# Patient Record
Sex: Male | Born: 1945 | Race: White | Hispanic: No | Marital: Married | Smoking: Former smoker
Health system: Southern US, Community
[De-identification: ages and names within clinical notes are randomized; demographics above are authoritative.]

## PROBLEM LIST (undated history)

## (undated) DIAGNOSIS — K219 Gastro-esophageal reflux disease without esophagitis: Secondary | ICD-10-CM

## (undated) DIAGNOSIS — I499 Cardiac arrhythmia, unspecified: Secondary | ICD-10-CM

## (undated) DIAGNOSIS — F419 Anxiety disorder, unspecified: Secondary | ICD-10-CM

## (undated) DIAGNOSIS — M5136 Other intervertebral disc degeneration, lumbar region: Secondary | ICD-10-CM

## (undated) DIAGNOSIS — Z8719 Personal history of other diseases of the digestive system: Secondary | ICD-10-CM

## (undated) DIAGNOSIS — N183 Chronic kidney disease, stage 3 unspecified: Secondary | ICD-10-CM

## (undated) DIAGNOSIS — F32A Depression, unspecified: Secondary | ICD-10-CM

## (undated) DIAGNOSIS — M109 Gout, unspecified: Secondary | ICD-10-CM

## (undated) DIAGNOSIS — M069 Rheumatoid arthritis, unspecified: Secondary | ICD-10-CM

## (undated) DIAGNOSIS — M51369 Other intervertebral disc degeneration, lumbar region without mention of lumbar back pain or lower extremity pain: Secondary | ICD-10-CM

## (undated) DIAGNOSIS — I10 Essential (primary) hypertension: Secondary | ICD-10-CM

## (undated) DIAGNOSIS — F329 Major depressive disorder, single episode, unspecified: Secondary | ICD-10-CM

## (undated) DIAGNOSIS — E785 Hyperlipidemia, unspecified: Secondary | ICD-10-CM

## (undated) HISTORY — PX: CHOLECYSTECTOMY: SHX55

## (undated) HISTORY — DX: Other intervertebral disc degeneration, lumbar region without mention of lumbar back pain or lower extremity pain: M51.369

## (undated) HISTORY — DX: Gastro-esophageal reflux disease without esophagitis: K21.9

## (undated) HISTORY — DX: Other intervertebral disc degeneration, lumbar region: M51.36

## (undated) HISTORY — DX: Hyperlipidemia, unspecified: E78.5

## (undated) HISTORY — DX: Depression, unspecified: F32.A

## (undated) HISTORY — DX: Chronic kidney disease, stage 3 unspecified: N18.30

## (undated) HISTORY — DX: Anxiety disorder, unspecified: F41.9

## (undated) HISTORY — DX: Gout, unspecified: M10.9

## (undated) HISTORY — DX: Personal history of other diseases of the digestive system: Z87.19

## (undated) HISTORY — DX: Rheumatoid arthritis, unspecified: M06.9

## (undated) HISTORY — DX: Essential (primary) hypertension: I10

## (undated) HISTORY — PX: HERNIA REPAIR: SHX51

## (undated) HISTORY — PX: EYE SURGERY: SHX253

---

## 1966-09-05 HISTORY — PX: APPENDECTOMY: SHX54

## 1968-09-05 HISTORY — PX: APPENDECTOMY: SHX54

## 1996-09-05 HISTORY — PX: TIBIA FRACTURE SURGERY: SHX806

## 1997-12-23 ENCOUNTER — Ambulatory Visit (HOSPITAL_COMMUNITY): Admission: RE | Admit: 1997-12-23 | Discharge: 1997-12-23 | Payer: Self-pay | Admitting: Neurosurgery

## 1998-09-05 DIAGNOSIS — Z8711 Personal history of peptic ulcer disease: Secondary | ICD-10-CM

## 1998-09-05 HISTORY — PX: OTHER SURGICAL HISTORY: SHX169

## 1998-09-05 HISTORY — DX: Personal history of peptic ulcer disease: Z87.11

## 1998-09-05 HISTORY — PX: LUMBAR LAMINECTOMY: SHX95

## 2001-09-28 ENCOUNTER — Encounter: Payer: Self-pay | Admitting: Emergency Medicine

## 2001-09-28 ENCOUNTER — Encounter (INDEPENDENT_AMBULATORY_CARE_PROVIDER_SITE_OTHER): Payer: Self-pay | Admitting: *Deleted

## 2001-09-28 ENCOUNTER — Emergency Department (HOSPITAL_COMMUNITY): Admission: EM | Admit: 2001-09-28 | Discharge: 2001-09-29 | Payer: Self-pay | Admitting: Emergency Medicine

## 2003-04-30 ENCOUNTER — Encounter: Payer: Self-pay | Admitting: Orthopaedic Surgery

## 2003-04-30 ENCOUNTER — Encounter: Payer: Self-pay | Admitting: Emergency Medicine

## 2003-04-30 ENCOUNTER — Inpatient Hospital Stay (HOSPITAL_COMMUNITY): Admission: AC | Admit: 2003-04-30 | Discharge: 2003-05-02 | Payer: Self-pay

## 2003-05-01 ENCOUNTER — Encounter: Payer: Self-pay | Admitting: Orthopaedic Surgery

## 2003-07-30 ENCOUNTER — Inpatient Hospital Stay (HOSPITAL_COMMUNITY): Admission: EM | Admit: 2003-07-30 | Discharge: 2003-08-01 | Payer: Self-pay | Admitting: Emergency Medicine

## 2003-08-19 ENCOUNTER — Encounter (INDEPENDENT_AMBULATORY_CARE_PROVIDER_SITE_OTHER): Payer: Self-pay | Admitting: *Deleted

## 2003-09-16 ENCOUNTER — Encounter: Payer: Self-pay | Admitting: Internal Medicine

## 2003-10-23 ENCOUNTER — Encounter: Admission: RE | Admit: 2003-10-23 | Discharge: 2003-11-25 | Payer: Self-pay | Admitting: Orthopaedic Surgery

## 2005-06-13 ENCOUNTER — Ambulatory Visit: Payer: Self-pay | Admitting: Internal Medicine

## 2005-06-23 ENCOUNTER — Ambulatory Visit: Payer: Self-pay | Admitting: Internal Medicine

## 2005-06-23 ENCOUNTER — Encounter (INDEPENDENT_AMBULATORY_CARE_PROVIDER_SITE_OTHER): Payer: Self-pay | Admitting: *Deleted

## 2005-06-23 ENCOUNTER — Encounter (INDEPENDENT_AMBULATORY_CARE_PROVIDER_SITE_OTHER): Payer: Self-pay | Admitting: Specialist

## 2005-09-14 ENCOUNTER — Observation Stay (HOSPITAL_COMMUNITY): Admission: EM | Admit: 2005-09-14 | Discharge: 2005-09-15 | Payer: Self-pay | Admitting: Emergency Medicine

## 2005-09-14 ENCOUNTER — Encounter (INDEPENDENT_AMBULATORY_CARE_PROVIDER_SITE_OTHER): Payer: Self-pay | Admitting: *Deleted

## 2005-09-15 ENCOUNTER — Encounter (INDEPENDENT_AMBULATORY_CARE_PROVIDER_SITE_OTHER): Payer: Self-pay | Admitting: *Deleted

## 2006-10-06 ENCOUNTER — Encounter: Admission: RE | Admit: 2006-10-06 | Discharge: 2006-10-06 | Payer: Self-pay

## 2006-10-08 ENCOUNTER — Encounter: Admission: RE | Admit: 2006-10-08 | Discharge: 2006-10-08 | Payer: Self-pay

## 2007-03-01 ENCOUNTER — Encounter: Admission: RE | Admit: 2007-03-01 | Discharge: 2007-03-01 | Payer: Self-pay | Admitting: Internal Medicine

## 2008-06-09 ENCOUNTER — Encounter: Admission: RE | Admit: 2008-06-09 | Discharge: 2008-06-09 | Payer: Self-pay | Admitting: Internal Medicine

## 2008-11-24 ENCOUNTER — Telehealth: Payer: Self-pay | Admitting: Internal Medicine

## 2008-11-25 ENCOUNTER — Ambulatory Visit: Payer: Self-pay | Admitting: Gastroenterology

## 2008-11-25 DIAGNOSIS — R110 Nausea: Secondary | ICD-10-CM | POA: Insufficient documentation

## 2008-11-25 DIAGNOSIS — D126 Benign neoplasm of colon, unspecified: Secondary | ICD-10-CM

## 2008-11-25 DIAGNOSIS — M069 Rheumatoid arthritis, unspecified: Secondary | ICD-10-CM | POA: Insufficient documentation

## 2008-11-25 DIAGNOSIS — K5730 Diverticulosis of large intestine without perforation or abscess without bleeding: Secondary | ICD-10-CM | POA: Insufficient documentation

## 2008-11-25 DIAGNOSIS — K259 Gastric ulcer, unspecified as acute or chronic, without hemorrhage or perforation: Secondary | ICD-10-CM | POA: Insufficient documentation

## 2008-11-25 DIAGNOSIS — R1084 Generalized abdominal pain: Secondary | ICD-10-CM

## 2008-11-25 DIAGNOSIS — R1013 Epigastric pain: Secondary | ICD-10-CM | POA: Insufficient documentation

## 2008-11-25 DIAGNOSIS — K269 Duodenal ulcer, unspecified as acute or chronic, without hemorrhage or perforation: Secondary | ICD-10-CM | POA: Insufficient documentation

## 2008-11-25 LAB — CONVERTED CEMR LAB
Basophils Relative: 0.3 % (ref 0.0–3.0)
Hemoglobin: 13.8 g/dL (ref 13.0–17.0)
MCHC: 35 g/dL (ref 30.0–36.0)
MCV: 85.3 fL (ref 78.0–100.0)
RDW: 12.4 % (ref 11.5–14.6)
WBC: 9.4 10*3/uL (ref 4.5–10.5)

## 2008-12-10 ENCOUNTER — Ambulatory Visit: Payer: Self-pay | Admitting: Internal Medicine

## 2010-05-07 ENCOUNTER — Encounter: Payer: Self-pay | Admitting: Internal Medicine

## 2010-09-26 ENCOUNTER — Encounter: Payer: Self-pay | Admitting: Internal Medicine

## 2010-10-05 NOTE — Letter (Signed)
Summary: Colonoscopy Letter  [facility]  [address] [place], [place] [address]   Phone: [phone]  Fax: [phone]      May 07, 2010 MRN: [id number]   [name] [address] [place], [place]  [address]   Dear Mr. Weible,   According to your medical record, it is time for you to schedule a Colonoscopy. The American Cancer Society recommends this procedure as a method to detect early colon cancer. Patients with a family history of colon cancer, or a personal history of colon polyps or inflammatory bowel disease are at increased risk.  This letter has been generated based on the recommendations made at the time of your procedure. If you feel that in your particular situation this may no longer apply, please contact our office.  Please call our office at [phone] to schedule this appointment or to update your records at your earliest convenience.  Thank you for cooperating with [place] to provide you with the very best care possible.   Sincerely,  Wilhemina Bonito. Marina Goodell, M.D.  [facility] [phone]

## 2010-10-07 ENCOUNTER — Encounter: Payer: Self-pay | Admitting: Internal Medicine

## 2010-10-14 ENCOUNTER — Telehealth: Payer: Self-pay | Admitting: Internal Medicine

## 2010-10-15 ENCOUNTER — Ambulatory Visit (INDEPENDENT_AMBULATORY_CARE_PROVIDER_SITE_OTHER): Payer: Medicare Other | Admitting: Internal Medicine

## 2010-10-15 ENCOUNTER — Encounter: Payer: Self-pay | Admitting: Internal Medicine

## 2010-10-15 DIAGNOSIS — R112 Nausea with vomiting, unspecified: Secondary | ICD-10-CM

## 2010-10-15 DIAGNOSIS — K219 Gastro-esophageal reflux disease without esophagitis: Secondary | ICD-10-CM | POA: Insufficient documentation

## 2010-10-15 DIAGNOSIS — R1013 Epigastric pain: Secondary | ICD-10-CM

## 2010-10-15 HISTORY — DX: Nausea with vomiting, unspecified: R11.2

## 2010-10-21 ENCOUNTER — Other Ambulatory Visit: Payer: Self-pay | Admitting: Internal Medicine

## 2010-10-21 ENCOUNTER — Encounter (AMBULATORY_SURGERY_CENTER): Payer: Medicare Other | Admitting: Internal Medicine

## 2010-10-21 DIAGNOSIS — D126 Benign neoplasm of colon, unspecified: Secondary | ICD-10-CM

## 2010-10-21 DIAGNOSIS — K219 Gastro-esophageal reflux disease without esophagitis: Secondary | ICD-10-CM

## 2010-10-21 DIAGNOSIS — Z711 Person with feared health complaint in whom no diagnosis is made: Secondary | ICD-10-CM

## 2010-10-21 DIAGNOSIS — Z86010 Personal history of colonic polyps: Secondary | ICD-10-CM

## 2010-10-21 DIAGNOSIS — Z1211 Encounter for screening for malignant neoplasm of colon: Secondary | ICD-10-CM

## 2010-10-21 NOTE — Progress Notes (Signed)
Summary: Triage / abdominal pain with nausea and vomiting   Phone Note Call from Patient Call back at [phone]   Caller: Patient Call For: Dr. Marina Goodell Reason for Call: Talk to Nurse Summary of Call: Had x-ray at Dr. Lynne Logan office on Tues. Continues to have severe abd pain and nausea/vomiting Initial call taken by: Karna Christmas,  October 14, 2010 8:16 AM  Follow-up for Phone Call        Patient c/o abdominal pain, states it has been going on for 2 months. States he had nausea and vomiting last night. He has been to his primary care physician, Dr. Ricki Miller, and had some xrays done. Pt states nothing was found on the xrays.Per Patient Dr Ricki Miller started him on Aciphex this week. His abdominal pain is at his belly button area. Patient received letter to schedule Colon in September and has not scheduled procedure yet. Dr. Marina Goodell please advise. Follow-up by: Selinda Michaels RN,  October 14, 2010 10:47 AM  Additional Follow-up for Phone Call Additional follow up Details #1::        he needs to be seenin the office. He needs to stay on AcipHex. He can see Amy in the office this morning or me in the office tomorrow afternoon Additional Follow-up by: Hilarie Fredrickson MD,  October 14, 2010 10:55 AM    Additional Follow-up for Phone Call Additional follow up Details #2::    Left message to call back, have booked patient to see Dr. Marina Goodell 10/15/10@1 :30pm. Follow-up by: Selinda Michaels RN,  October 14, 2010 11:10 AM  Additional Follow-up for Phone Call Additional follow up Details #3:: Details for Additional Follow-up Action Taken: Notified patient of Dr Lamar Sprinkles note- patient will see Dr Marina Goodell tomorrow, 10/15/10 @ 1:30pm. Graciella Freer, RN Additional Follow-up by: Selinda Michaels RN,  October 14, 2010 11:59 AM

## 2010-10-21 NOTE — Discharge Summary (Signed)
Summary: Discharge Summary  NAME:  [name], [name]               ACCOUNT NO.:  [id number]   MEDICAL RECORD NO.:  [id number]          PATIENT TYPE:  INP   LOCATION:  A304                          FACILITY:  [facility]   PHYSICIAN:  Kingsley Callander. Ouida Sills, MD       DATE OF BIRTH:  06/05/1946   DATE OF ADMISSION:  09/14/2005  DATE OF DISCHARGE:  01/11/2007LH                                 DISCHARGE SUMMARY   DISCHARGE DIAGNOSES:  1.  Gastroenteritis.  2.  Rheumatoid arthritis.  3.  Cholelithiasis.   PROCEDURE:  Abdominal ultrasound.   DISCHARGE MEDICATIONS:  1.  Protonix 40 mg daily.  2.  Leflunomide 20 mg daily.  3.  Humira 40 mg weekly.  4.  Fish oil daily.  5.  Vitamin D, E and C daily.  6.  Lopid 600 mg daily.  7.  Klonopin 1 mg q.h.s.  8.  Tramadol 1 a day.  9.  Paroxetine 20 mg a day.  10. Prednisone 5 mg a day.  11. Folic acid 1 mg a day.  12. Hydroxychloroquine 200 mg b.i.d.  13. Phenergan 25 mg q.6h. p.r.n.   HOSPITAL COURSE:  This patient is a 65 year old white male with rheumatoid  arthritis who presented with a 3-day history of nausea, vomiting and  headache. He presented to the emergency room for evaluation. He had  experienced abdominal pain. His white count was 7.6. He had a low grade  fever of 100.3. LFTs were normal. His lipase was slightly elevated initially  at 56; repeat was 54. He was hospitalized and treated with IV antiemetics  and IV fluids. His repeat CBC was normal. Repeat electrolytes were normal.  His repeat lipase was 54. Amylase was normal at 86. He had an ultrasound of  the abdomen which revealed cholelithiasis. He did not have tenderness,  though, in the right upper quadrant on January 11. His symptoms of abdominal  pain and vomiting promptly  resolved. He was able to take a full liquid diet. He was improved and stable  for discharge on January 11. He will follow-up in the office in two weeks if  needed. He will follow-up with rheumatoid arthritis with Dr.  Phylliss Bob as  scheduled.      Kingsley Callander. Ouida Sills, MD  Electronically Signed     ROF/MEDQ  D:  09/15/2005  T:  09/15/2005  Job:  [id number]

## 2010-10-21 NOTE — Discharge Summary (Signed)
Summary: Discharge Summary   NAME:  EBENEZER, MCCASKEY                         ACCOUNT NO.:  [id number]   MEDICAL RECORD NO.:  [id number]                   PATIENT TYPE:  INP   LOCATION:  3040                                 FACILITY:  [facility]   PHYSICIAN:  Wilhemina Bonito. Marina Goodell, M.D. [facility]             DATE OF BIRTH:  1946/05/13   DATE OF ADMISSION:  07/30/2003  DATE OF DISCHARGE:  08/01/2003                                 DISCHARGE SUMMARY   ADMITTING DIAGNOSES:  1. Upper gastrointestinal bleed, probably from nonsteroidal-induced ulcer,     possibly peptic ulcer disease as well.  2. History of heavy alcohol use, possibly may have some underlying liver     disease.  3. 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  4. Rheumatoid arthritis.  5. Chronic pain.  6. Right leg fracture August 2004.  Status post left tibia-fibula fracture     repaired August 2004 by Dr. Noel Gerold.  7. Status post right inguinal hernia repair.  8. Status post appendectomy.  9. Status post lumbar disk surgery.   DISCHARGE DIAGNOSES:  1. Upper gastrointestinal bleed secondary to aspirin/nonsteroidal anti-     inflammatory drug-induced ulcer.  2. Anemia secondary to acute gastrointestinal bleed with probable underlying     chronic anemia.  Status post transfusions with two units of packed red     blood cells.  3. Azotemia secondary to gastrointestinal bleed, resolving during the course     of hospitalization.  4. CLOtest negative.   CONSULTATIONS:  None.   PROCEDURES:  Upper endoscopy by Dr. Yancey Flemings on July 30, 2003.  This  showed an 8-mm ulcer in the antrum with dark spot but nonbleeding status.  A  deep 15-mm ulcer seen in the duodenal bulb, again a dark spot seen at the  ulcer, but it was not actively bleeding.  No interventions were performed as  there was no active bleeding.   BRIEF HISTORY:  Mr. Mccutchan is a 65 year old gentleman with  a history of  rheumatoid arthritis and chronic pain for which he uses 650 mg of aspirin  and 100 mg of Voltaren every day.  On the morning of admission he started  passing melenic stools and became dizzy and presyncopal.  He had some nausea  but no vomiting.  Blood pressure low at 103/65 on arrival to the emergency  room, pulse of 71, and blood pressure actually dropped to 83/63 but  responded nicely to IV fluid bolus.  Initial hemoglobin was 10, and BUN was  elevated at 47.  The patient gave a history of two to three days of nausea  and vomiting, none of which was bloody, about two weeks prior to admission.  The patient had been using Prilosec OTC once daily.  He had no prior history  of GI bleed, ulcers, and had never undergone colonoscopy  or upper endoscopy  previously.  The patient was evaluated in the emergency room by Dr. Marina Goodell  and was admitted for evaluation and supportive care.   LABORATORIES:  Hemoglobin initially 10.  It dropped to a low of 8.3 and was  10.3 at discharge.  Hematocrit was 30.8 at discharge.  MCV 84.8.  Platelets  219,000.  Stool for occult blood was positive.  PT 14.5, INR 1.2, and PTT  27.  Sodium 140, potassium 4.5, glucose 100, BUN went from 47 down to 24.  Creatinine was 1.3.  Calcium 8.2.  CLOtesting was negative on a gastric  biopsy.   HOSPITAL COURSE:  The patient was initially admitted to an ICU bed.  He had  serial hemoglobins and hematocrits obtained.  He was aggressively hydrated  with normal saline at 150 an hour and started on IV Protonix twice daily.   On the day of admission he underwent upper endoscopy.  Findings are  described above.  His hemoglobin dropped, and two units of packed red blood  cells were ordered transfused.  Following those transfusions the patient's  hemoglobin and hematocrit remained stable in the low 10- to mid 10-mg range.  The patient's symptoms resolved.  He was having no further nausea, and  dizziness resolved.  While  hospitalized he did not have any further stools.  Ultimately his diet was restarted at clears and advanced up to full liquids,  all of which he tolerated.  IV fluids were discontinued, and he was switched  over to p.o. Protonix.  He was discharged to home in stable condition on  hospital day #3.  Follow-up appointment was arranged with Dr. Marina Goodell on  Monday, August 18, 2003.  He was to call Dr. Lamar Sprinkles office for any  problems.   MEDICATIONS AT DISCHARGE:  1. Protonix 40 mg twice daily.  2. Paxil CR 20 mg daily.  3. Clonazepam 1 mg p.o. at h.s.  4. Gemfibrozil 600 mg b.i.d.  5. Plaquenil (hydroxychloroquine) 200 mg b.i.d.   He was advised not to take any more Voltaren or aspirin and to call Dr. Phylliss Bob  for appropriate management of arthritic pain and symptoms.      Jennye Moccasin, P.A. [facility]                   John N. Marina Goodell, M.D. [facility]    SG/MEDQ  D:  08/19/2003  T:  08/19/2003  Job:  [id number]   cc:   Kingsley Callander. Ouida Sills, M.D.  [address]  [place]  [place] [address]  Fax: [phone]   Areatha Keas, M.D.  [address]  [address]  [place]  [place] [address]  Fax: [phone]

## 2010-10-21 NOTE — Letter (Signed)
Summary: [facility] Instructions  [facility]  [address] [place], [place] [address]   Phone: [phone]  Fax: [phone]       EWART CARRERA    1945-12-17    MRN: [id number]        Procedure Day /Date:THURSDAY, 10/21/10     Arrival Time:7:30 AM     Procedure Time:8:30 AM     Location of Procedure:                    X  [facility] (4th Floor)        PREPARATION FOR COLONOSCOPY WITH MOVIPREP/ENDO   Starting 5 days prior to your procedure 10/16/10 do not eat nuts, seeds, popcorn, corn, beans, peas,  salads, or any raw vegetables.  Do not take any fiber supplements (e.g. Metamucil, Citrucel, and Benefiber).  THE DAY BEFORE YOUR PROCEDURE         DATE: 10/20/10  DAY: WEDNESDAY  1.  Drink clear liquids the entire day-NO SOLID FOOD  2.  Do not drink anything colored red or purple.  Avoid juices with pulp.  No orange juice.  3.  Drink at least 64 oz. (8 glasses) of fluid/clear liquids during the day to prevent dehydration and help the prep work efficiently.  CLEAR LIQUIDS INCLUDE: Water Jello Ice Popsicles Tea (sugar ok, no milk/cream) Powdered fruit flavored drinks Coffee (sugar ok, no milk/cream) Gatorade Juice: apple, white grape, white cranberry  Lemonade Clear bullion, consomm, broth Carbonated beverages (any kind) Strained chicken noodle soup Hard Candy                             4.  In the morning, mix first dose of MoviPrep solution:    Empty 1 Pouch A and 1 Pouch B into the disposable container    Add lukewarm drinking water to the top line of the container. Mix to dissolve    Refrigerate (mixed solution should be used within 24 hrs)  5.  Begin drinking the prep at 5:00 p.m. The MoviPrep container is divided by 4 marks.   Every 15 minutes drink the solution down to the next mark (approximately 8 oz) until the full liter is complete.   6.  Follow completed prep with 16 oz of clear liquid of your choice (Nothing red or  purple).  Continue to drink clear liquids until bedtime.  7.  Before going to bed, mix second dose of MoviPrep solution:    Empty 1 Pouch A and 1 Pouch B into the disposable container    Add lukewarm drinking water to the top line of the container. Mix to dissolve    Refrigerate  THE DAY OF YOUR PROCEDURE      DATE: 10/21/10 DAY: THURSDAY  Beginning at 3:30 a.m. (5 hours before procedure):         1. Every 15 minutes, drink the solution down to the next mark (approx 8 oz) until the full liter is complete.  2. Follow completed prep with 16 oz. of clear liquid of your choice.    3. You may drink clear liquids until 6:30 AM (2 HOURS BEFORE PROCEDURE).   MEDICATION INSTRUCTIONS  Unless otherwise instructed, you should take regular prescription medications with a small sip of water   as early as possible the morning of your procedure.         OTHER INSTRUCTIONS  You will need a responsible adult at  least 66 years of age to accompany you and drive you home.   This person must remain in the waiting room during your procedure.  Wear loose fitting clothing that is easily removed.  Leave jewelry and other valuables at home.  However, you may wish to bring a book to read or  an iPod/MP3 player to listen to music as you wait for your procedure to start.  Remove all body piercing jewelry and leave at home.  Total time from sign-in until discharge is approximately 2-3 hours.  You should go home directly after your procedure and rest.  You can resume normal activities the  day after your procedure.  The day of your procedure you should not:   Drive   Make legal decisions   Operate machinery   Drink alcohol   Return to work  You will receive specific instructions about eating, activities and medications before you leave.    The above instructions have been reviewed and explained to me by   _______________________    I fully understand and can verbalize these  instructions _____________________________ Date _________

## 2010-10-21 NOTE — Assessment & Plan Note (Signed)
Summary: Abdominal pain    History of Present Illness Visit Type: Follow-up Visit Primary GI MD: Yancey Flemings MD Primary Provider: Juline Patch, MD Requesting Provider: self Chief Complaint: Abdominal pain, nausea x 2 months; pain occurs every evening History of Present Illness:   65 year old white male a history of rheumatoid arthritis, GERD, NSAID-induced gastric and duodenal ulcer disease, and anxiety/depression. He presents today regarding problems with chronic abdominal pain and recent nausea with vomiting. The patient was last evaluated in March of 2010 with complaints of abdominal pain, vomiting, and diarrhea. See that dictation for details. Upper endoscopy performed 2 weeks later revealed mild esophagitis, but was otherwise normal. The patient was feeling better and told to continue on PPI therapy. He tells me that he has been taking over-the-counter acid reducers. Not sure what type. Current history is that of 2 weeks of mid abdominal discomfort that occurs on a daily basis. Mostly in the evenings after his evening meal. Rarely wakes him. 4 pound weight loss. Nausea with vomiting the past few days. Given AcipHex samples by his PCP 3 days ago. He apparently had blood work drawn that was unremarkable. No complaints of abnormal bowel habits, urinary issues, or fevers. The pain is nonradiating. Her last colonoscopy was in 2006 and revealed several hyperplastic polyps, some large. Followup in 5 years recommended. He is aware and requests followup colonoscopy. Abdominal ultrasound from January 2007 revealed cholelithiasis.   GI Review of Systems    Reports abdominal pain, acid reflux, nausea, vomiting, and  weight loss.     Location of  Abdominal pain: mid and lower abdomen. Weight loss of 4 pounds over 2 days.   Denies belching, bloating, chest pain, dysphagia with liquids, dysphagia with solids, heartburn, loss of appetite, vomiting blood, and  weight gain.        Denies anal fissure, black  tarry stools, change in bowel habit, constipation, diarrhea, diverticulosis, fecal incontinence, heme positive stool, hemorrhoids, irritable bowel syndrome, jaundice, light color stool, liver problems, rectal bleeding, and  rectal pain.    Current Medications (verified): 1)  Allopurinol 300 Mg Tabs (Allopurinol) .... Take 1 Tablet By Mouth Once Daily 2)  Lexapro 10 Mg Tabs (Escitalopram Oxalate) .... Take 1 Daily Am 3)  Clonazepam 1 Mg Tabs (Clonazepam) .... Take 1 Tab At Bedtime 4)  Omega-3 Fish Oil 1000 Mg Caps (Omega-3 Fatty Acids) .... 6000 Mg Ndaily 5)  Prednisone 5 Mg Tabs (Prednisone) .[name].. 1daily or More As Needed 6)  Ultracet 37.5-325 Mg Tabs (Tramadol-Acetaminophen) .... Take 2-3 Daily 7)  Methotrexate Sodium 25 Mg/ml Soln (Methotrexate Sodium) .... 0.6 Ml's Weekly 8)  Aciphex 20 Mg Tbec (Rabeprazole Sodium) .... Take 1 Tablet By Mouth Once Daily 9)  Losartan Potassium 50 Mg Tabs (Losartan Potassium) .... Take 1 Tablet By Mouth Once Daily 10)  Vitamin B-12 1000 Mcg Tabs (Cyanocobalamin) .... Take 1 Tablet By Mouth Once Daily 11)  Folic Acid 1 Mg Tabs (Folic Acid) .... Take 1 Tablet By Mouth Once Daily  Allergies (verified): No Known Drug Allergies  Past History:  Past Medical History: Reviewed history from 11/25/2008 and no changes required. Current Problems:  COLONIC POLYPS (ICD-211.3) DIVERTICULOSIS, COLON (ICD-562.10) GASTRIC ULCER (ICD-531.90) Hx of DUODENAL ULCER (ICD-532.90) ARTHRITIS, RHEUMATOID (ICD-714.0)  Past Surgical History: Reviewed history from 11/25/2008 and no changes required. Lt tibia/fibula fracture repair Appendectomy Lumbar disk surgery Hernia surgery Stomach/ulcer surgery  Family History: N/A No FH of Colon Cancer:  Social History: Reviewed history from 11/25/2008 and no changes required. Occupation:  [employer] Alcohol Use - no Daily Caffeine Use- Coffee Illicit Drug Use - no Patient does not get regular exercise.   Review of  Systems       The patient complains of arthritis/joint pain, back pain, muscle pains/cramps, sleeping problems, and swelling of feet/legs.  The patient denies allergy/sinus, anemia, anxiety-new, blood in urine, breast changes/lumps, change in vision, confusion, cough, coughing up blood, depression-new, fainting, fatigue, fever, headaches-new, hearing problems, heart murmur, heart rhythm changes, itching, menstrual pain, night sweats, nosebleeds, pregnancy symptoms, shortness of breath, skin rash, sore throat, swollen lymph glands, thirst - excessive , urination - excessive , urination changes/pain, urine leakage, vision changes, and voice change.    Vital Signs:  Patient profile:   65 year old male Height:      68 inches Weight:      168.13 pounds BMI:     25.66 Pulse rate:   76 / minute Pulse rhythm:   regular BP sitting:   120 / 66  (left arm) Cuff size:   regular  Vitals Entered By: June McMurray CMA Duncan Dull) (October 15, 2010 1:22 PM)  Physical Exam  General:  Well developed, well nourished, no acute distress. Head:  Normocephalic and atraumatic. Eyes:  PERRLA, no icterus. Ears:  Normal auditory acuity. Nose:  No deformity, discharge,  or lesions. Mouth:  No deformity or lesions. Neck:  Supple; no masses or thyromegaly. Lungs:  Clear throughout to auscultation. Heart:  Regular rate and rhythm; no murmurs, rubs,  or bruits. Abdomen:  Soft, nontender and nondistended. No masses, hepatosplenomegaly or hernias noted. Normal bowel sounds. Msk:   Normal posture. Pulses:  Normal pulses noted. Extremities:  No clubbing, cyanosis, edema. Some deformity of the hands Neurologic:  Alert and  oriented x4;  grossly normal neurologically. Skin:  Intact without significant lesions or rashes. Psych:  Alert and cooperative. Normal mood and affect.   Impression & Recommendations:  Problem # 1:  ABDOMINAL PAIN-EPIGASTRIC (ICD-789.06) 2 month history of daily abdominal pain as described.  Problems with nausea and vomiting over the past 2 days. Has known GERD, a prior history of peptic ulcer disease. Also cholelithiasis on ultrasound.  Plan: #1. Dexilant 60 mg daily. Samples given #2. Schedule upper endoscopy #3. Obtain outside laboratories for review #4. If endoscopy unremarkable, repeat abdominal ultrasound to confirm cholelithiasis. If present, surgical referral for possible laparoscopic cholecystectomy  Problem # 2:  NAUSEA WITH VOMITING (ICD-787.01) see above discussion and plan  Problem # 3:  COLONIC POLYPS (ICD-211.3) history of large hyperplastic polyps. Due for followup.  Plan: #1 colonoscopy. The nature of the procedure as well as the risks, benefits, and alternatives were reviewed. He understood and agreed to proceed. Movi prep prescribed. The patient instructed on its use  Problem # 4:  GERD (ICD-530.81) PPI prescribed antireflux precautions  Other Orders: Colon/Endo (Colon/Endo)  Patient Instructions: 1)  Colon/Endo [facility] 10/21/10 8:30 am arrive at 7:30 am on the 4th floor. 2)  Movi prep instructions given to patient and Movi prescription has been sent to your pharmacy. 3)  Dexilant samples given for you to take 1 by mouth once daily 30 minutes prior to breakfast. 4)  We will obtain your lab results from Dr. Lynne Logan office. 5)  Colonoscopy and Flexible Sigmoidoscopy brochure given.  6)  Upper Endoscopy brochure given.  7)  Copy sent to : Juline Patch, MD 8)  The medication list was reviewed and reconciled.  All changed / newly prescribed medications were explained.  A complete medication  list was provided to the patient / caregiver. Prescriptions: MOVIPREP 100 GM  SOLR (PEG-KCL-NACL-NASULF-NA ASC-C) As per prep instructions.  #1 x 0   Entered by:   Milford Cage NCMA   Authorized by:   Hilarie Fredrickson MD   Signed by:   Milford Cage NCMA on 10/15/2010   Method used:   Electronically to        [facility] [phone]* (retail)       [address]       [place], [place]  [address]       Ph: [phone] or [phone]       Fax: [phone]   RxID:   [id number]

## 2010-10-26 ENCOUNTER — Encounter: Payer: Self-pay | Admitting: Internal Medicine

## 2010-10-27 ENCOUNTER — Ambulatory Visit (HOSPITAL_COMMUNITY)
Admission: RE | Admit: 2010-10-27 | Discharge: 2010-10-27 | Disposition: A | Discharge status: Home / Self Care | Payer: Medicare Other | Source: Ambulatory Visit | Attending: Internal Medicine | Admitting: Internal Medicine

## 2010-10-27 DIAGNOSIS — K8020 Calculus of gallbladder without cholecystitis without obstruction: Secondary | ICD-10-CM | POA: Insufficient documentation

## 2010-10-27 DIAGNOSIS — Z711 Person with feared health complaint in whom no diagnosis is made: Secondary | ICD-10-CM

## 2010-10-27 DIAGNOSIS — R1084 Generalized abdominal pain: Secondary | ICD-10-CM | POA: Insufficient documentation

## 2010-10-27 NOTE — Procedures (Addendum)
Summary: Colonoscopy  Patient: [name] Note: All result statuses are Final unless otherwise noted.  Tests: (1) Colonoscopy (COL)   COL Colonoscopy           DONE     [facility]     [address]     [place], [place]  [address]           COLONOSCOPY PROCEDURE REPORT           PATIENT:  [name], [name]  MR#:  [id number]     BIRTHDATE:  30-Jul-1946, 65 yrs. old  GENDER:  male     ENDOSCOPIST:  Wilhemina Bonito. Eda Keys, MD     REF. BY:  Surveillance Program Recall,     PROCEDURE DATE:  10/21/2010     PROCEDURE:  Colonoscopy with snare polypectomy x 10     EXTENDED SERVICE FOR MULTIPLE     POLYPS     ASA CLASS:  Class II     INDICATIONS:  history of hyperplastic polyps, surveillance and     high-risk screening ;large hyperplastic polyps 2006     MEDICATIONS:   Fentanyl 75 mcg IV, Versed 8 mg IV           DESCRIPTION OF PROCEDURE:   After the risks benefits and     alternatives of the procedure were thoroughly explained, informed     consent was obtained.  Digital rectal exam was performed and     revealed no abnormalities.   The LB 180AL [id number] endoscope was     introduced through the anus and advanced to the cecum, which was     identified by both the appendix and ileocecal valve, without     limitations.Time to cecum = 4:16 min.  The quality of the prep was     excellent, using MoviPrep.  The instrument was then slowly     withdrawn (time = 15:41 min) as the colon was fully examined.     <<PROCEDUREIMAGES>>           FINDINGS:  There were multiple polyps identified and removed.     These appearred hyperplastic except possible one tiny one in     cecum. These ranged between 5 and 10mm ans were sessile. 3 cecal,     3 ascending colon, 3 transverse colon, and one sigmoid colon.     Polyps were snared without cautery. Retrieval was successful.     Mild diverticulosis was found in the sigmoid colon.   Retroflexed     views in the rectum revealed no abnormalities.    The  scope was     then withdrawn from the patient and the procedure completed.           COMPLICATIONS:  None           ENDOSCOPIC IMPRESSION:     1) Polyps, multiple (10) - removed     2) Mild diverticulosis in the sigmoid colon           RECOMMENDATIONS:     1) Follow up colonoscopy in 5 years if < 3 adenomas     2) EGD today           ______________________________     Wilhemina Bonito. Eda Keys, MD           CC:  Juline Patch, MD; The Patient           n.     eSIGNED:   Wilhemina Bonito. Eda Keys  at 10/21/2010 10:22 AM           Virl Cagey, [id number]  Note: An exclamation mark (!) indicates a result that was not dispersed into the flowsheet. Document Creation Date: 10/21/2010 10:23 AM _______________________________________________________________________  (1) Order result status: Final Collection or observation date-time: 10/21/2010 10:08 Requested date-time:  Receipt date-time:  Reported date-time:  Referring Physician:   Ordering Physician: Fransico Setters [phone]) Specimen Source:  Source: Launa Grill Order Number: [id number] Lab site:   Appended Document: Colonoscopy recall 3 yrs     Procedures Next Due Date:    Colonoscopy: 10/2013

## 2010-10-27 NOTE — Procedures (Addendum)
Summary: Upper Endoscopy  Patient: Claude Waldman Note: All result statuses are Final unless otherwise noted.  Tests: (1) Upper Endoscopy (EGD)   EGD Upper Endoscopy       DONE (C)     [facility]     [address]     [place], [place]  [address]           ENDOSCOPY PROCEDURE REPORT           PATIENT:  Lynnwood, Beckford  MR#:  [id number]     BIRTHDATE:  09/09/1945, 65 yrs. old  GENDER:  male           ENDOSCOPIST:  Wilhemina Bonito. Eda Keys, MD     Referred by:  Office / Self           PROCEDURE DATE:  10/21/2010     PROCEDURE:  EGD, diagnostic 19147     ASA CLASS:  Class II     INDICATIONS:  epigastric pain, nausea and vomiting           MEDICATIONS:   There was residual sedation effect present from     prior procedure., Fentanyl 25 mcg IV, Versed 4 mg IV     TOPICAL ANESTHETIC:  Exactacain Spray           DESCRIPTION OF PROCEDURE:   After the risks benefits and     alternatives of the procedure were thoroughly explained, informed     consent was obtained.  The LB GIF-H180 [id number] endoscope was     introduced through the mouth and advanced to the second portion of     the duodenum, without limitations.  The instrument was slowly     withdrawn as the mucosa was fully examined.     <<PROCEDUREIMAGES>>           The upper, middle, and distal third of the esophagus were     carefully inspected and no abnormalities were noted. The z-line     was well seen at the GEJ. The endoscope was pushed into the fundus     which was normal including a retroflexed view. The antrum,gastric     body, first and second part of the duodenum were unremarkable.     Retroflexed views revealed no abnormalities.    The scope was then     withdrawn from the patient and the procedure completed.           COMPLICATIONS:  None           ENDOSCOPIC IMPRESSION:     1) Normal EGD     2) Gerd     RECOMMENDATIONS:     1) Continue Dexilant     2) My office will arrange for you to have an abdominal  ultrasound performed "r/o gallstones".           ______________________________     Wilhemina Bonito. Eda Keys, MD           CC:  Juline Patch, MD;  The Patient           n.     REVISED:  10/26/2010 08:47 AM     eSIGNED:   Wilhemina Bonito. Eda Keys at 10/26/2010 08:47 AM           Virl Cagey, [id number]  Note: An exclamation mark (!) indicates a result that was not dispersed into the flowsheet. Document Creation Date: 10/26/2010 8:47 AM _______________________________________________________________________  (1) Order result status: Final Collection or observation date-time:  10/21/2010 10:32 Requested date-time:  Receipt date-time:  Reported date-time:  Referring Physician:   Ordering Physician: Fransico Setters [phone]) Specimen Source:  Source: Launa Grill Order Number: [id number] Lab site:

## 2010-11-02 NOTE — Letter (Signed)
Summary: Patient Notice- Polyp Results  [facility]  [address] [place], [place] [address]   Phone: [phone]  Fax: [phone]        October 26, 2010 MRN: [id number]    [name] [address] [place], [place]  [address]    Dear Mr. Nola,  I am pleased to inform you that the colon polyps removed during your recent colonoscopy were found to be benign (no cancer detected) upon pathologic examination.  However, given the number of adenomatous polyps, I recommend you have a repeat colonoscopy examination in 3 years to look for recurrent polyps, as having colon polyps increases your risk for having recurrent polyps or even colon cancer in the future.  Should you develop new or worsening symptoms of abdominal pain, bowel habit changes or bleeding from the rectum or bowels, please schedule an evaluation with either your primary care physician or with me.    Additional information/recommendations:  __ No further action with gastroenterology is needed at this time. Please      follow-up with your primary care physician for your other healthcare      needs.    Please call us if you are having persistent problems or have questions about your condition that have not been fully answered at this time.  Sincerely,  Hilarie Fredrickson MD  This letter has been electronically signed by your physician.  Appended Document: Patient Notice- Polyp Results letter mailed

## 2010-12-30 ENCOUNTER — Other Ambulatory Visit: Payer: Self-pay

## 2011-01-21 NOTE — Discharge Summary (Signed)
[name], [name]               ACCOUNT NO.:  [id number]   MEDICAL RECORD NO.:  [id number]          PATIENT TYPE:  INP   LOCATION:  A304                          FACILITY:  [facility]   PHYSICIAN:  Kingsley Callander. Ouida Sills, MD       DATE OF BIRTH:  1946-08-30   DATE OF ADMISSION:  09/14/2005  DATE OF DISCHARGE:  01/11/2007LH                                 DISCHARGE SUMMARY   DISCHARGE DIAGNOSES:  1.  Gastroenteritis.  2.  Rheumatoid arthritis.  3.  Cholelithiasis.   PROCEDURE:  Abdominal ultrasound.   DISCHARGE MEDICATIONS:  1.  Protonix 40 mg daily.  2.  Leflunomide 20 mg daily.  3.  Humira 40 mg weekly.  4.  Fish oil daily.  5.  Vitamin D, E and C daily.  6.  Lopid 600 mg daily.  7.  Klonopin 1 mg q.h.s.  8.  Tramadol 1 a day.  9.  Paroxetine 20 mg a day.  10. Prednisone 5 mg a day.  11. Folic acid 1 mg a day.  12. Hydroxychloroquine 200 mg b.i.d.  13. Phenergan 25 mg q.6h. p.r.n.   HOSPITAL COURSE:  This patient is a 65 year old white male with rheumatoid  arthritis who presented with a 3-day history of nausea, vomiting and  headache. He presented to the emergency room for evaluation. He had  experienced abdominal pain. His white count was 7.6. He had a low grade  fever of 100.3. LFTs were normal. His lipase was slightly elevated initially  at 56; repeat was 54. He was hospitalized and treated with IV antiemetics  and IV fluids. His repeat CBC was normal. Repeat electrolytes were normal.  His repeat lipase was 54. Amylase was normal at 86. He had an ultrasound of  the abdomen which revealed cholelithiasis. He did not have tenderness,  though, in the right upper quadrant on January 11. His symptoms of abdominal  pain and vomiting promptly  resolved. He was able to take a full liquid diet. He was improved and stable  for discharge on January 11. He will follow-up in the office in two weeks if  needed. He will follow-up with rheumatoid arthritis with Dr. Phylliss Bob as  scheduled.      Kingsley Callander. Ouida Sills, MD  Electronically Signed     ROF/MEDQ  D:  09/15/2005  T:  09/15/2005  Job:  [id number]
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
## 2011-01-21 NOTE — H&P (Signed)
[name], [name]               ACCOUNT NO.:  [id number]   MEDICAL RECORD NO.:  [id number]          PATIENT TYPE:  INP   LOCATION:  A304                          FACILITY:  [facility]   PHYSICIAN:  Edward L. Juanetta Gosling, M.D.DATE OF BIRTH:  October 17, 1945   DATE OF ADMISSION:  09/14/2005  DATE OF DISCHARGE:  LH                                HISTORY & PHYSICAL   REASON FOR ADMISSION:  Abdominal pain, nausea, vomiting, and headache.   HISTORY:  This is a 65 year old who came to the emergency room with  abdominal pain, nausea and vomiting, and severe headache.  He has been  recently started on a new medication for rheumatoid arthritis and he is not  sure if this is causing his headaches or what, but he has developed severe  problems with nausea, severe problems with the headache, abdominal pain and  came to the emergency room.  He has a significant past medical history of  having had a GI bleed in November 2004.  He says that this does not feel  like it did so much when he had ulcers, but he did not have a lot of  symptoms until he started bleeding then.  He had been on aspirin.  He had  been on anti-inflammatories but he is on none of those now.  He has not  generally had a great deal of headaches, so it is unusual for him to have a  headache like this.  His headache is frontal associated with nausea.  He  does have the abdominal pain in addition however.  While he was in the  emergency room, he underwent evaluation and was found to have stones in his  gallbladder and Dr. Lovell Sheehan was consulted.  Dr. Lovell Sheehan has seen Mr. Quillin  and feels like this is not likely acute cholecystitis and I tend to agree  with that.   PAST MEDICAL HISTORY:  1.  Rheumatoid arthritis.  2.  He has had tib-fib fracture repair in August 2004.  3.  Right inguinal hernia repair.  4.  Appendectomy.  5.  Lumbar disk surgery many years ago.   MEDICATIONS:  1.  Plaquenil 200 mg daily.  2.  Prednisone 5 mg daily.  3.   Paxil 20 mg daily.  4.  Tramadol p.r.n. pain 50 mg.  5.  Klonopin 1 mg q.h.s. p.r.n.  6.  Gemfibrozil 600 mg b.i.d.  7.  Fish oil over the counter.  8.  Humira injections 40 mg daily.  9.  Protonix 40 mg daily.  10. A new medication that I am not familiar with, leflunomide 20 mg daily      which is an oral medication for his arthritis.   SOCIAL HISTORY:  He works as a Chartered certified accountant.  He does not smoke, although he  did but stopped 25 years or so ago.  He does not drink any alcohol, has a  remote history of alcohol abuse, none in the last 20 years or so.   FAMILY HISTORY:  His mother is still living in her 28s.  He does not know  about his  father's history.  He does not know of any history of abdominal  problems.   REVIEW OF SYSTEMS:  He has chronic pain related to his arthritis.  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  Otherwise he has had no fever,  chills, nausea, vomiting, cough, congestion.   PHYSICAL EXAMINATION:  GENERAL:  Shows a well-developed, well-nourished male  who is in no acute distress now.  He says he feels much better since he has  had something for nausea.  VITAL SIGNS:  Blood pressure 156/75, pulse is 52, respirations 18,  temperature is 97.4.  HEENT:  His mucous membranes are moist.  His nose and throat are clear.  Pupils are reactive.  His tympanic membranes are intact.  He has some  twitching below his left eye.  NECK:  Supple.  He does not have any masses.  He does not have any bruits.  CHEST:  Clear without wheezes, rales, or rhonchi.  HEART:  Regular without murmur, gallop or rub.  ABDOMEN:  Soft.  No masses are felt.  Mildly tender in the mid epigastric  region.  His bowel sounds are present and active.  He does not have any  organomegaly.  Stool negative in the emergency room.  I did not repeat a  rectal exam.  EXTREMITIES:  Showed changes of rheumatoid arthritis.   ASSESSMENT:  1.  He has abdominal pain, nausea, vomiting.  2.  He does have  gallstones but it is not clear that this is acute      cholecystitis.  It very well may be more problem with peptic ulcer      disease.   PLAN:  1.  Go ahead and treat him basically with Protonix, IV fluids, and make sure      he has something for nausea.  2.  Repeat his labs in the morning.  3.  Dr. Ouida Sills will take over in the morning.  4.  He may need GI consultation but I am going to wait for Dr. Ouida Sills to see      him, unless he has overt bleeding, etcetera through the night.  5.  I am going to hold most of his medications at this point, put him on      Protonix, and follow.      Edward L. Juanetta Gosling, M.D.  Electronically Signed     ELH/MEDQ  D:  09/14/2005  T:  09/14/2005  Job:  [id number]

## 2011-01-21 NOTE — H&P (Signed)
NAME:  [name], [name]                         ACCOUNT NO.:  [id number]   MEDICAL RECORD NO.:  [id number]                   PATIENT TYPE:  INP   LOCATION:  1823                                 FACILITY:  [facility]   PHYSICIAN:  Wilhemina Bonito. Marina Goodell, M.D. [facility]             DATE OF BIRTH:  22-May-1946   DATE OF ADMISSION:  07/30/2003  DATE OF DISCHARGE:                                HISTORY & PHYSICAL   CHIEF COMPLAINT:  Dark stools and profound dizziness.   HISTORY OF PRESENT ILLNESS:  This is a 65 year old white male with a history  of rheumatoid arthritis and chronic pain.  He takes large doses of aspirin  as well as chronic Voltaren every day.  Early this morning, he began to have  the first of about 4-5 black, marroonish-looking stools.  He became  profoundly dizzy and presyncopal with sweats but no vomiting.  Some nausea.  On arrival in the ER, initial blood pressures 103/65 with a pulse of 71.  It  did get to 83/63 with a pulse of 99.  He received nearly a liter of IV  fluids with resulting blood pressure 113/62.  Initial hemoglobin was 10.  BUN is elevated at 47.   The patient says that two weeks ago, he had a 2-3 day history of nausea and  vomiting, which was not bloody.  It resolved, and he required some Phenergan  for support then.  Since then, he has not been eating as well.  Bowels are  irregular, but this is the first time he has ever had any dark stools.  No  prior history of GI bleeds, ulcers, colonoscopies, or upper endoscopies.   ALLERGIES:  None.   CURRENT MEDICATIONS:  1. Aspirin 650 mg daily.  2. Diclofenac 100 mg daily.  3. Paxil, dose unknown, once daily.  4. Prilosec OTC once daily.  5. Supplements include vitamins C, E, and A.   PAST MEDICAL HISTORY:  1. Rheumatoid arthritis.  2. Status post left tib/fib fracture, repaired in August, 2004 by Dr. Noel Gerold.  3. Status post right inguinal hernia repair.  4. Status post right appendectomy.  5. Status post lumbar disk  surgery.  6. Chronic pain secondary to rheumatoid arthritis.   SOCIAL HISTORY:  The patient is a Chartered certified accountant.  He had just returned to work  after a prolonged furlough because of the leg fracture.  He started working  this Monday, four days ago.  Does not smoke.  Remotely did abuse alcohol, up  to a case a day, 20 years ago.   FAMILY HISTORY:  No family history of GI disease.   REVIEW OF SYSTEMS:  He has a couple of tattoos.  The latest they date back  to is eight years ago.  He denies any history of liver disease and was on  methotrexate in the past for a long period of time and had regular  monitoring of his  LFTs then.  He had to stop that medication because of  nausea and GI upset.  Patient has chronic polyarthralgias.  He denies chest  pain, palpitations, shortness of breath.  No cough.  No fevers but did have  sweats today when he felt presyncopal.  Patient has had some problems with  some swelling and erythema.  It has been followed by Dr. Noel Gerold.  He has not  required any antibiotics for cellulitis.   PHYSICAL EXAMINATION:  GENERAL:  Patient is an older white male who is in no  acute distress.  He provides a good history.  VITAL SIGNS:  Blood pressure 113/62, pulse 76, respirations 18, temperature  98.  Room air saturation 98%.  HEENT:  Extraocular movements are intact.  No pallor.  Oropharynx moist and  clear.  NECK:  No masses.  No JVD.  CHEST:  Clear to auscultation and percussion bilaterally.  No cough.  No  shortness of breath.  COR:  There is a regular rate and rhythm.  No murmurs, rubs or gallops.  ABDOMEN:  Soft with active bowel sounds.  Nondistended.  There is epigastric  tenderness but no guarding or rebound.  RECTAL:  Per the ED physician and not repeated.  Had black stool which was  fecal occult blood positive.  EXTREMITIES:  There is some slight swelling and erythema and a glossy  appearance to the skin on the left shin.  It is slightly tender.  NEUROLOGICAL:  No  tremors.  Grip is 5/5.  He is grossly neurologically  intact.  PSYCH:  The patient is appropriate.  No obvious depression.   LABORATORY DATA:  BUN is 47.  No creatinine obtained on the I-STAT.  Sodium  140, potassium 4.5, glucose 100.  Hemoglobin is 10, hematocrit 28.2, white  blood cell count 9.0.  MCV is 85.  Platelets 291,000.  PT 14.5, INR 1.2, PTT  27.   IMPRESSION:  1. Upper gastrointestinal bleed, probably from an nonsteroidal-induced     ulcer, possibly peptic ulcer disease as well.  Nothing in his history to     suggest that he would have any liver disease and associated bleeding from     that, but needs to be considered because he does have a very remote     history of some heavy alcohol use.  2. Anemia secondary to gastrointestinal bleed but also may have some chronic     anemia because of his chronic disease, rheumatoid arthritis.  3. Rheumatoid arthritis.  4. Chronic pain.  5. Right leg fracture in August, 2004.   PLAN:  1. Patient to be admitted to Dr. Lamar Sprinkles service to the ICU, as his blood     pressure is a bit low and he was presyncopal, so we need to watch him     closely.  2. Upper endoscopy, to be done within the next few hours.  3. Push IV fluids.  4. Begin proton pump inhibitors IV for now.  5. Serial hemoglobin and hematocrits and type and cross the patient in case     he requires transfusion.      Jennye Moccasin, P.A. [facility]                   John N. Marina Goodell, M.D. [facility]    SG/MEDQ  D:  07/30/2003  T:  07/30/2003  Job:  [id number]

## 2011-01-21 NOTE — Discharge Summary (Signed)
NAME:  [name], [name]                         ACCOUNT NO.:  [id number]   MEDICAL RECORD NO.:  [id number]                   PATIENT TYPE:  INP   LOCATION:  3040                                 FACILITY:  [facility]   PHYSICIAN:  Wilhemina Bonito. Marina Goodell, M.D. [facility]             DATE OF BIRTH:  [birth year]   DATE OF ADMISSION:  07/30/2003  DATE OF DISCHARGE:  08/01/2003                                 DISCHARGE SUMMARY   ADMITTING DIAGNOSES:  1. Upper gastrointestinal bleed, probably from nonsteroidal-induced ulcer,     possibly peptic ulcer disease as well.  2. History of heavy alcohol use, possibly may have some underlying liver     disease.  3. 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  4. Rheumatoid arthritis.  5. Chronic pain.  6. Right leg fracture August 2004.  Status post left tibia-fibula fracture     repaired August 2004 by Dr. Noel Gerold.  7. Status post right inguinal hernia repair.  8. Status post appendectomy.  9. Status post lumbar disk surgery.   DISCHARGE DIAGNOSES:  1. Upper gastrointestinal bleed secondary to aspirin/nonsteroidal anti-     inflammatory drug-induced ulcer.  2. Anemia secondary to acute gastrointestinal bleed with probable underlying     chronic anemia.  Status post transfusions with two units of packed red     blood cells.  3. Azotemia secondary to gastrointestinal bleed, resolving during the course     of hospitalization.  4. CLOtest negative.   CONSULTATIONS:  None.   PROCEDURES:  Upper endoscopy by Dr. Yancey Flemings on July 30, 2003.  This  showed an 8-mm ulcer in the antrum with dark spot but nonbleeding status.  A  deep 15-mm ulcer seen in the duodenal bulb, again a dark spot seen at the  ulcer, but it was not actively bleeding.  No interventions were performed as  there was no active bleeding.   BRIEF HISTORY:  [name] is a 65 year old gentleman with a history of  rheumatoid  arthritis and chronic pain for which he uses 650 mg of aspirin  and 100 mg of Voltaren every day.  On the morning of admission he started  passing melenic stools and became dizzy and presyncopal.  He had some nausea  but no vomiting.  Blood pressure low at 103/65 on arrival to the emergency  room, pulse of 71, and blood pressure actually dropped to 83/63 but  responded nicely to IV fluid bolus.  Initial hemoglobin was 10, and BUN was  elevated at 47.  The patient gave a history of two to three days of nausea  and vomiting, none of which was bloody, about two weeks prior to admission.  The patient had been using Prilosec OTC once daily.  He had no prior history  of GI bleed, ulcers, and had never undergone colonoscopy or upper endoscopy  previously.  The patient was evaluated in the emergency room by Dr. Marina Goodell  and was admitted for evaluation and supportive care.   LABORATORIES:  Hemoglobin initially 10.  It dropped to a low of 8.3 and was  10.3 at discharge.  Hematocrit was 30.8 at discharge.  MCV 84.8.  Platelets  219,000.  Stool for occult blood was positive.  PT 14.5, INR 1.2, and PTT  27.  Sodium 140, potassium 4.5, glucose 100, BUN went from 47 down to 24.  Creatinine was 1.3.  Calcium 8.2.  CLOtesting was negative on a gastric  biopsy.   HOSPITAL COURSE:  The patient was initially admitted to an ICU bed.  He had  serial hemoglobins and hematocrits obtained.  He was aggressively hydrated  with normal saline at 150 an hour and started on IV Protonix twice daily.   On the day of admission he underwent upper endoscopy.  Findings are  described above.  His hemoglobin dropped, and two units of packed red blood  cells were ordered transfused.  Following those transfusions the patient's  hemoglobin and hematocrit remained stable in the low 10- to mid 10-mg range.  The patient's symptoms resolved.  He was having no further nausea, and  dizziness resolved.  While hospitalized he did not have  any further stools.  Ultimately his diet was restarted at clears and advanced up to full liquids,  all of which he tolerated.  IV fluids were discontinued, and he was switched  over to p.o. Protonix.  He was discharged to home in stable condition on  hospital day #3.  Follow-up appointment was arranged with Dr. Marina Goodell on  Monday, August 18, 2003.  He was to call Dr. Lamar Sprinkles office for any  problems.   MEDICATIONS AT DISCHARGE:  1. Protonix 40 mg twice daily.  2. Paxil CR 20 mg daily.  3. Clonazepam 1 mg p.o. at h.s.  4. Gemfibrozil 600 mg b.i.d.  5. Plaquenil (hydroxychloroquine) 200 mg b.i.d.   He was advised not to take any more Voltaren or aspirin and to call Dr. Phylliss Bob  for appropriate management of arthritic pain and symptoms.      Jennye Moccasin, P.A. [facility]                   John N. Marina Goodell, M.D. [facility]    SG/MEDQ  D:  08/19/2003  T:  08/19/2003  Job:  [id number]   cc:   Kingsley Callander. Ouida Sills, M.D.  [address]  [place]  [place] [address]  Fax: [phone]   Areatha Keas, M.D.  [address]  [address]  [place]  [place] [address]  Fax: [phone]

## 2012-05-10 ENCOUNTER — Other Ambulatory Visit: Payer: Self-pay | Admitting: Internal Medicine

## 2012-05-10 DIAGNOSIS — R109 Unspecified abdominal pain: Secondary | ICD-10-CM

## 2012-05-14 ENCOUNTER — Ambulatory Visit
Admission: RE | Admit: 2012-05-14 | Discharge: 2012-05-14 | Disposition: A | Discharge status: Home / Self Care | Payer: Medicare Other | Source: Ambulatory Visit | Attending: Internal Medicine | Admitting: Internal Medicine

## 2012-05-14 DIAGNOSIS — R109 Unspecified abdominal pain: Secondary | ICD-10-CM

## 2012-05-14 MED ORDER — IOHEXOL 300 MG/ML  SOLN
100.0000 mL | Freq: Once | INTRAMUSCULAR | Status: AC | PRN
  Administered 2012-05-14: 100 mL via INTRAVENOUS

## 2013-07-29 ENCOUNTER — Other Ambulatory Visit: Payer: Self-pay | Admitting: Orthopedic Surgery

## 2013-07-29 DIAGNOSIS — M549 Dorsalgia, unspecified: Secondary | ICD-10-CM

## 2013-07-29 DIAGNOSIS — M48061 Spinal stenosis, lumbar region without neurogenic claudication: Secondary | ICD-10-CM

## 2013-07-29 DIAGNOSIS — M79604 Pain in right leg: Secondary | ICD-10-CM

## 2013-08-08 ENCOUNTER — Ambulatory Visit
Admission: RE | Admit: 2013-08-08 | Discharge: 2013-08-08 | Disposition: A | Discharge status: Home / Self Care | Payer: Medicare Other | Source: Ambulatory Visit | Attending: Orthopedic Surgery | Admitting: Orthopedic Surgery

## 2013-08-08 VITALS — BP 156/81 | HR 61

## 2013-08-08 DIAGNOSIS — M79604 Pain in right leg: Secondary | ICD-10-CM

## 2013-08-08 DIAGNOSIS — M48061 Spinal stenosis, lumbar region without neurogenic claudication: Secondary | ICD-10-CM

## 2013-08-08 DIAGNOSIS — M549 Dorsalgia, unspecified: Secondary | ICD-10-CM

## 2013-08-08 MED ORDER — DIAZEPAM 5 MG PO TABS
5.0000 mg | ORAL_TABLET | Freq: Once | ORAL | Status: AC
  Administered 2013-08-08: 5 mg via ORAL

## 2013-08-08 MED ORDER — IOHEXOL 180 MG/ML  SOLN: 17.0000 mL | Freq: Once | INTRAMUSCULAR | Status: AC | PRN

## 2013-08-08 NOTE — Progress Notes (Signed)
States he has been off citalopram and tramadol for the past 2 days. Discharge instructions explained.

## 2013-09-26 ENCOUNTER — Other Ambulatory Visit: Payer: Self-pay | Admitting: Internal Medicine

## 2013-09-26 DIAGNOSIS — N289 Disorder of kidney and ureter, unspecified: Secondary | ICD-10-CM

## 2013-10-01 ENCOUNTER — Ambulatory Visit
Admission: RE | Admit: 2013-10-01 | Discharge: 2013-10-01 | Disposition: A | Discharge status: Home / Self Care | Payer: Medicare Other | Source: Ambulatory Visit | Attending: Internal Medicine | Admitting: Internal Medicine

## 2013-10-01 DIAGNOSIS — N289 Disorder of kidney and ureter, unspecified: Secondary | ICD-10-CM

## 2013-10-11 ENCOUNTER — Encounter: Payer: Self-pay | Admitting: Internal Medicine

## 2014-03-20 ENCOUNTER — Encounter: Payer: Self-pay | Admitting: Internal Medicine

## 2014-03-25 ENCOUNTER — Encounter: Payer: Self-pay | Admitting: Internal Medicine

## 2014-05-14 ENCOUNTER — Ambulatory Visit (AMBULATORY_SURGERY_CENTER): Payer: Self-pay | Admitting: *Deleted

## 2014-05-14 VITALS — Ht 68.0 in | Wt 168.0 lb

## 2014-05-14 DIAGNOSIS — Z86010 Personal history of colonic polyps: Secondary | ICD-10-CM

## 2014-05-14 MED ORDER — MOVIPREP 100 G PO SOLR: ORAL | Status: DC

## 2014-05-14 NOTE — Progress Notes (Signed)
No allergies to eggs or soy. No problems with anesthesia.  Pt declined Emmi for colonoscopy  No oxygen use  No diet drug use'

## 2014-05-28 ENCOUNTER — Encounter: Payer: Self-pay | Admitting: Internal Medicine

## 2014-05-28 ENCOUNTER — Ambulatory Visit (AMBULATORY_SURGERY_CENTER): Payer: Medicare Other | Admitting: Internal Medicine

## 2014-05-28 VITALS — BP 143/75 | HR 54 | Temp 96.0°F | Resp 26 | Ht 68.0 in | Wt 168.0 lb

## 2014-05-28 DIAGNOSIS — Z860100 Personal history of colon polyps, unspecified: Secondary | ICD-10-CM

## 2014-05-28 DIAGNOSIS — D126 Benign neoplasm of colon, unspecified: Secondary | ICD-10-CM

## 2014-05-28 MED ORDER — SODIUM CHLORIDE 0.9 % IV SOLN: 500.0000 mL | INTRAVENOUS | Status: DC

## 2014-05-28 NOTE — Op Note (Signed)
[place]  [address] [place], [address]   COLONOSCOPY PROCEDURE REPORT  PATIENT: [name], [name]  MR#: [id number] BIRTHDATE: [birth year] , 26  yrs. old GENDER: male ENDOSCOPIST: Eustace Quail, MD REFERRED TL:[id number] Program Recall PROCEDURE DATE:  05/28/2014 PROCEDURE:   Colonoscopy with snare polypectomy x 6 First Screening Colonoscopy - Avg.  risk and is 50 yrs.  old or older - No.  Prior Negative Screening - Now for repeat screening. N/A  History of Adenoma - Now for follow-up colonoscopy & has been > or = to 3 yrs.  Yes hx of adenoma.  Has been 3 or more years since last colonoscopy.  Polyps Removed Today? Yes. ASA CLASS:   Class II INDICATIONS:surveillance colonoscopy based on a history of adenomatous colonic polyp(s). Index 2006 with multiple large hyperplastic polyps. Last colonoscopy 2012 with 10 polyps (TA,SSP,HP). MEDICATIONS: Monitored anesthesia care and Propofol 300 mg  DESCRIPTION OF PROCEDURE:   After the risks benefits and alternatives of the procedure were thoroughly explained, informed consent was obtained.  revealed no abnormalities of the rectum. The [id number]  endoscope was introduced through the anus and advanced to the cecum, which was identified by both the appendix and ileocecal valve. No adverse events experienced.   The quality of the prep was excellent, using MoviPrep  The instrument was then slowly withdrawn as the colon was fully examined.      COLON FINDINGS: Six polyps ranging between 5-38mm in size were found in the descending colon, sigmoid colon, ascending colon, and transverse colon (3).  A polypectomy was performed with a cold snare.  The resection was complete, the polyp tissue was completely retrieved and sent to histology.   There was moderate diverticulosis noted in the left colon.   The colon mucosa was otherwise normal.  Retroflexed views revealed internal hemorrhoids. The time to cecum=3  minutes 10 seconds.  Withdrawal time=16 minutes 09 seconds.  The scope was withdrawn and the procedure completed. COMPLICATIONS: There were no complications.  ENDOSCOPIC IMPRESSION: 1.   Six polyps were found in the descending, sigmoid, ascending colon, and transverse colon; polypectomy was performed with a cold snare 2.   Moderate diverticulosis was noted in the left colon 3.   The colon mucosa was otherwise normal RECOMMENDATIONS: 1. Repeat Colonoscopy in 3 years.  eSigned:  Eustace Quail, MD 05/28/2014 9:09 AM   cc: Tommy Medal, MD and The Patient   PATIENT NAME:  [name], [name] MR#: [id number]

## 2014-05-28 NOTE — Patient Instructions (Signed)
YOU HAD AN ENDOSCOPIC PROCEDURE TODAY AT THE [facility]: Refer to the procedure report that was given to you for any specific questions about what was found during the examination.  If the procedure report does not answer your questions, please call your gastroenterologist to clarify.  If you requested that your care partner not be given the details of your procedure findings, then the procedure report has been included in a sealed envelope for you to review at your convenience later.  YOU SHOULD EXPECT: Some feelings of bloating in the abdomen. Passage of more gas than usual.  Walking can help get rid of the air that was put into your GI tract during the procedure and reduce the bloating. If you had a lower endoscopy (such as a colonoscopy or flexible sigmoidoscopy) you may notice spotting of blood in your stool or on the toilet paper. If you underwent a bowel prep for your procedure, then you may not have a normal bowel movement for a few days.  DIET: Your first meal following the procedure should be a light meal and then it is ok to progress to your normal diet.  A half-sandwich or bowl of soup is an example of a good first meal.  Heavy or fried foods are harder to digest and may make you feel nauseous or bloated.  Likewise meals heavy in dairy and vegetables can cause extra gas to form and this can also increase the bloating.  Drink plenty of fluids but you should avoid alcoholic beverages for 24 hours.  ACTIVITY: Your care partner should take you home directly after the procedure.  You should plan to take it easy, moving slowly for the rest of the day.  You can resume normal activity the day after the procedure however you should NOT DRIVE or use heavy machinery for 24 hours (because of the sedation medicines used during the test).    SYMPTOMS TO REPORT IMMEDIATELY: A gastroenterologist can be reached at any hour.  During normal business hours, 8:30 AM to 5:00 PM Monday through Friday,  call [phone].  After hours and on weekends, please call the GI answering service at [phone] who will take a message and have the physician on call contact you.   Following lower endoscopy (colonoscopy or flexible sigmoidoscopy):  Excessive amounts of blood in the stool  Significant tenderness or worsening of abdominal pains  Swelling of the abdomen that is new, acute  Fever of 100F or higher   FOLLOW UP: If any biopsies were taken you will be contacted by phone or by letter within the next 1-3 weeks.  Call your gastroenterologist if you have not heard about the biopsies in 3 weeks.  Our staff will call the home number listed on your records the next business day following your procedure to check on you and address any questions or concerns that you may have at that time regarding the information given to you following your procedure. This is a courtesy call and so if there is no answer at the home number and we have not heard from you through the emergency physician on call, we will assume that you have returned to your regular daily activities without incident.  SIGNATURES/CONFIDENTIALITY: You and/or your care partner have signed paperwork which will be entered into your electronic medical record.  These signatures attest to the fact that that the information above on your After Visit Summary has been reviewed and is understood.  Full responsibility of the confidentiality of   this discharge information lies with you and/or your care-partner   INFORMATION ON POLYPS,DIVERTICULOSIS AND HIGH FIBER DIET GIVEN TO YOU TODAY

## 2014-05-28 NOTE — Progress Notes (Signed)
Called to room to assist during endoscopic procedure.  Patient ID and intended procedure confirmed with present staff. Received instructions for my participation in the procedure from the performing physician.  

## 2014-05-28 NOTE — Progress Notes (Signed)
Report to PACU, RN, vss, BBS= Clear.  

## 2014-05-29 ENCOUNTER — Telehealth: Payer: Self-pay | Admitting: *Deleted

## 2014-05-29 NOTE — Telephone Encounter (Signed)
  Follow up Call-  Call back number 05/28/2014  Post procedure Call Back phone  # [phone]  Permission to leave phone message No     Patient questions:  Do you have a fever, pain , or abdominal swelling? No. Pain Score  0 *  Have you tolerated food without any problems? Yes.    Have you been able to return to your normal activities? Yes.    Do you have any questions about your discharge instructions: Diet   No. Medications  No. Follow up visit  No.  Do you have questions or concerns about your Care? No.  Actions: * If pain score is 4 or above: No action needed, pain <4.

## 2014-06-03 ENCOUNTER — Encounter: Payer: Self-pay | Admitting: Internal Medicine

## 2014-09-08 DIAGNOSIS — M1A09X0 Idiopathic chronic gout, multiple sites, without tophus (tophi): Secondary | ICD-10-CM | POA: Diagnosis not present

## 2014-09-08 DIAGNOSIS — I10 Essential (primary) hypertension: Secondary | ICD-10-CM | POA: Diagnosis not present

## 2014-09-08 DIAGNOSIS — E780 Pure hypercholesterolemia: Secondary | ICD-10-CM | POA: Diagnosis not present

## 2014-09-08 DIAGNOSIS — F419 Anxiety disorder, unspecified: Secondary | ICD-10-CM | POA: Diagnosis not present

## 2014-09-08 DIAGNOSIS — M1000 Idiopathic gout, unspecified site: Secondary | ICD-10-CM | POA: Diagnosis not present

## 2014-09-08 DIAGNOSIS — M0589 Other rheumatoid arthritis with rheumatoid factor of multiple sites: Secondary | ICD-10-CM | POA: Diagnosis not present

## 2014-09-15 DIAGNOSIS — E780 Pure hypercholesterolemia: Secondary | ICD-10-CM | POA: Diagnosis not present

## 2014-09-15 DIAGNOSIS — I10 Essential (primary) hypertension: Secondary | ICD-10-CM | POA: Diagnosis not present

## 2014-09-15 DIAGNOSIS — M1A0790 Idiopathic chronic gout, unspecified ankle and foot, without tophus (tophi): Secondary | ICD-10-CM | POA: Diagnosis not present

## 2014-09-15 DIAGNOSIS — F411 Generalized anxiety disorder: Secondary | ICD-10-CM | POA: Diagnosis not present

## 2014-12-11 DIAGNOSIS — M0589 Other rheumatoid arthritis with rheumatoid factor of multiple sites: Secondary | ICD-10-CM | POA: Diagnosis not present

## 2014-12-11 DIAGNOSIS — E780 Pure hypercholesterolemia: Secondary | ICD-10-CM | POA: Diagnosis not present

## 2015-01-05 DIAGNOSIS — R634 Abnormal weight loss: Secondary | ICD-10-CM | POA: Diagnosis not present

## 2015-01-05 DIAGNOSIS — I10 Essential (primary) hypertension: Secondary | ICD-10-CM | POA: Diagnosis not present

## 2015-01-05 DIAGNOSIS — F411 Generalized anxiety disorder: Secondary | ICD-10-CM | POA: Diagnosis not present

## 2015-01-05 DIAGNOSIS — R5383 Other fatigue: Secondary | ICD-10-CM | POA: Diagnosis not present

## 2015-01-05 DIAGNOSIS — M1A0790 Idiopathic chronic gout, unspecified ankle and foot, without tophus (tophi): Secondary | ICD-10-CM | POA: Diagnosis not present

## 2015-01-13 DIAGNOSIS — E291 Testicular hypofunction: Secondary | ICD-10-CM | POA: Diagnosis not present

## 2015-01-13 DIAGNOSIS — R5383 Other fatigue: Secondary | ICD-10-CM | POA: Diagnosis not present

## 2015-01-13 DIAGNOSIS — I10 Essential (primary) hypertension: Secondary | ICD-10-CM | POA: Diagnosis not present

## 2015-01-26 DIAGNOSIS — R634 Abnormal weight loss: Secondary | ICD-10-CM | POA: Diagnosis not present

## 2015-01-26 DIAGNOSIS — E291 Testicular hypofunction: Secondary | ICD-10-CM | POA: Diagnosis not present

## 2015-02-25 ENCOUNTER — Encounter: Payer: Self-pay | Admitting: Internal Medicine

## 2015-02-26 ENCOUNTER — Other Ambulatory Visit: Payer: Self-pay

## 2015-03-26 DIAGNOSIS — L570 Actinic keratosis: Secondary | ICD-10-CM | POA: Diagnosis not present

## 2015-03-26 DIAGNOSIS — C44629 Squamous cell carcinoma of skin of left upper limb, including shoulder: Secondary | ICD-10-CM | POA: Diagnosis not present

## 2015-04-22 DIAGNOSIS — E291 Testicular hypofunction: Secondary | ICD-10-CM | POA: Diagnosis not present

## 2015-05-04 DIAGNOSIS — M0589 Other rheumatoid arthritis with rheumatoid factor of multiple sites: Secondary | ICD-10-CM | POA: Diagnosis not present

## 2015-05-04 DIAGNOSIS — M1000 Idiopathic gout, unspecified site: Secondary | ICD-10-CM | POA: Diagnosis not present

## 2015-05-04 DIAGNOSIS — Z23 Encounter for immunization: Secondary | ICD-10-CM | POA: Diagnosis not present

## 2015-05-04 DIAGNOSIS — I10 Essential (primary) hypertension: Secondary | ICD-10-CM | POA: Diagnosis not present

## 2015-05-04 DIAGNOSIS — R739 Hyperglycemia, unspecified: Secondary | ICD-10-CM | POA: Diagnosis not present

## 2015-05-14 DIAGNOSIS — E291 Testicular hypofunction: Secondary | ICD-10-CM | POA: Diagnosis not present

## 2015-05-27 DIAGNOSIS — R739 Hyperglycemia, unspecified: Secondary | ICD-10-CM | POA: Diagnosis not present

## 2015-05-27 DIAGNOSIS — M0579 Rheumatoid arthritis with rheumatoid factor of multiple sites without organ or systems involvement: Secondary | ICD-10-CM | POA: Diagnosis not present

## 2015-05-27 DIAGNOSIS — I10 Essential (primary) hypertension: Secondary | ICD-10-CM | POA: Diagnosis not present

## 2015-05-27 DIAGNOSIS — E291 Testicular hypofunction: Secondary | ICD-10-CM | POA: Diagnosis not present

## 2015-05-27 DIAGNOSIS — M1000 Idiopathic gout, unspecified site: Secondary | ICD-10-CM | POA: Diagnosis not present

## 2015-06-02 DIAGNOSIS — M1000 Idiopathic gout, unspecified site: Secondary | ICD-10-CM | POA: Diagnosis not present

## 2015-06-02 DIAGNOSIS — M0580 Other rheumatoid arthritis with rheumatoid factor of unspecified site: Secondary | ICD-10-CM | POA: Diagnosis not present

## 2015-06-02 DIAGNOSIS — R739 Hyperglycemia, unspecified: Secondary | ICD-10-CM | POA: Diagnosis not present

## 2015-06-02 DIAGNOSIS — I10 Essential (primary) hypertension: Secondary | ICD-10-CM | POA: Diagnosis not present

## 2015-06-24 DIAGNOSIS — M0589 Other rheumatoid arthritis with rheumatoid factor of multiple sites: Secondary | ICD-10-CM | POA: Diagnosis not present

## 2015-06-24 DIAGNOSIS — M1A9XX0 Chronic gout, unspecified, without tophus (tophi): Secondary | ICD-10-CM | POA: Diagnosis not present

## 2015-06-24 DIAGNOSIS — M79643 Pain in unspecified hand: Secondary | ICD-10-CM | POA: Diagnosis not present

## 2015-07-22 DIAGNOSIS — H9209 Otalgia, unspecified ear: Secondary | ICD-10-CM | POA: Diagnosis not present

## 2015-08-04 DIAGNOSIS — M1A09X0 Idiopathic chronic gout, multiple sites, without tophus (tophi): Secondary | ICD-10-CM | POA: Diagnosis not present

## 2015-08-04 DIAGNOSIS — M0589 Other rheumatoid arthritis with rheumatoid factor of multiple sites: Secondary | ICD-10-CM | POA: Diagnosis not present

## 2015-08-04 DIAGNOSIS — M25539 Pain in unspecified wrist: Secondary | ICD-10-CM | POA: Diagnosis not present

## 2015-08-12 DIAGNOSIS — K429 Umbilical hernia without obstruction or gangrene: Secondary | ICD-10-CM | POA: Diagnosis not present

## 2015-09-23 DIAGNOSIS — I10 Essential (primary) hypertension: Secondary | ICD-10-CM | POA: Diagnosis not present

## 2015-09-23 DIAGNOSIS — R739 Hyperglycemia, unspecified: Secondary | ICD-10-CM | POA: Diagnosis not present

## 2015-09-30 DIAGNOSIS — F329 Major depressive disorder, single episode, unspecified: Secondary | ICD-10-CM | POA: Diagnosis not present

## 2015-09-30 DIAGNOSIS — Z1389 Encounter for screening for other disorder: Secondary | ICD-10-CM | POA: Diagnosis not present

## 2015-09-30 DIAGNOSIS — I10 Essential (primary) hypertension: Secondary | ICD-10-CM | POA: Diagnosis not present

## 2015-09-30 DIAGNOSIS — G4700 Insomnia, unspecified: Secondary | ICD-10-CM | POA: Diagnosis not present

## 2015-09-30 DIAGNOSIS — M1990 Unspecified osteoarthritis, unspecified site: Secondary | ICD-10-CM | POA: Diagnosis not present

## 2016-07-18 ENCOUNTER — Other Ambulatory Visit: Payer: Self-pay

## 2016-07-18 DIAGNOSIS — D0462 Carcinoma in situ of skin of left upper limb, including shoulder: Secondary | ICD-10-CM | POA: Diagnosis not present

## 2016-07-18 DIAGNOSIS — D0461 Carcinoma in situ of skin of right upper limb, including shoulder: Secondary | ICD-10-CM | POA: Diagnosis not present

## 2016-07-18 DIAGNOSIS — L570 Actinic keratosis: Secondary | ICD-10-CM | POA: Diagnosis not present

## 2016-08-25 DIAGNOSIS — L570 Actinic keratosis: Secondary | ICD-10-CM | POA: Diagnosis not present

## 2016-08-25 DIAGNOSIS — D0471 Carcinoma in situ of skin of right lower limb, including hip: Secondary | ICD-10-CM | POA: Diagnosis not present

## 2016-08-25 DIAGNOSIS — D0462 Carcinoma in situ of skin of left upper limb, including shoulder: Secondary | ICD-10-CM | POA: Diagnosis not present

## 2016-09-23 DIAGNOSIS — I10 Essential (primary) hypertension: Secondary | ICD-10-CM | POA: Diagnosis not present

## 2016-09-23 DIAGNOSIS — M059 Rheumatoid arthritis with rheumatoid factor, unspecified: Secondary | ICD-10-CM | POA: Diagnosis not present

## 2016-09-23 DIAGNOSIS — E291 Testicular hypofunction: Secondary | ICD-10-CM | POA: Diagnosis not present

## 2016-09-23 DIAGNOSIS — R739 Hyperglycemia, unspecified: Secondary | ICD-10-CM | POA: Diagnosis not present

## 2016-09-23 DIAGNOSIS — Z125 Encounter for screening for malignant neoplasm of prostate: Secondary | ICD-10-CM | POA: Diagnosis not present

## 2016-09-29 DIAGNOSIS — Z0000 Encounter for general adult medical examination without abnormal findings: Secondary | ICD-10-CM | POA: Diagnosis not present

## 2016-09-29 DIAGNOSIS — R739 Hyperglycemia, unspecified: Secondary | ICD-10-CM | POA: Diagnosis not present

## 2016-09-29 DIAGNOSIS — M1000 Idiopathic gout, unspecified site: Secondary | ICD-10-CM | POA: Diagnosis not present

## 2016-09-29 DIAGNOSIS — I10 Essential (primary) hypertension: Secondary | ICD-10-CM | POA: Diagnosis not present

## 2016-09-29 DIAGNOSIS — E7800 Pure hypercholesterolemia, unspecified: Secondary | ICD-10-CM | POA: Diagnosis not present

## 2016-10-26 DIAGNOSIS — M19041 Primary osteoarthritis, right hand: Secondary | ICD-10-CM | POA: Diagnosis not present

## 2016-10-26 DIAGNOSIS — M1A09X0 Idiopathic chronic gout, multiple sites, without tophus (tophi): Secondary | ICD-10-CM | POA: Diagnosis not present

## 2016-10-26 DIAGNOSIS — M79643 Pain in unspecified hand: Secondary | ICD-10-CM | POA: Diagnosis not present

## 2016-10-26 DIAGNOSIS — M19042 Primary osteoarthritis, left hand: Secondary | ICD-10-CM | POA: Diagnosis not present

## 2016-10-26 DIAGNOSIS — M0589 Other rheumatoid arthritis with rheumatoid factor of multiple sites: Secondary | ICD-10-CM | POA: Diagnosis not present

## 2016-10-26 DIAGNOSIS — M25539 Pain in unspecified wrist: Secondary | ICD-10-CM | POA: Diagnosis not present

## 2016-10-26 DIAGNOSIS — M0569 Rheumatoid arthritis of multiple sites with involvement of other organs and systems: Secondary | ICD-10-CM | POA: Diagnosis not present

## 2016-10-31 ENCOUNTER — Other Ambulatory Visit: Payer: Self-pay

## 2016-10-31 DIAGNOSIS — D492 Neoplasm of unspecified behavior of bone, soft tissue, and skin: Secondary | ICD-10-CM | POA: Diagnosis not present

## 2016-10-31 DIAGNOSIS — L821 Other seborrheic keratosis: Secondary | ICD-10-CM | POA: Diagnosis not present

## 2016-10-31 DIAGNOSIS — L570 Actinic keratosis: Secondary | ICD-10-CM | POA: Diagnosis not present

## 2016-10-31 DIAGNOSIS — D045 Carcinoma in situ of skin of trunk: Secondary | ICD-10-CM | POA: Diagnosis not present

## 2016-10-31 DIAGNOSIS — L814 Other melanin hyperpigmentation: Secondary | ICD-10-CM | POA: Diagnosis not present

## 2016-11-15 DIAGNOSIS — H04122 Dry eye syndrome of left lacrimal gland: Secondary | ICD-10-CM | POA: Diagnosis not present

## 2016-11-24 DIAGNOSIS — L570 Actinic keratosis: Secondary | ICD-10-CM | POA: Diagnosis not present

## 2016-11-24 DIAGNOSIS — D045 Carcinoma in situ of skin of trunk: Secondary | ICD-10-CM | POA: Diagnosis not present

## 2017-02-06 DIAGNOSIS — M5416 Radiculopathy, lumbar region: Secondary | ICD-10-CM | POA: Diagnosis not present

## 2017-02-14 DIAGNOSIS — M5416 Radiculopathy, lumbar region: Secondary | ICD-10-CM | POA: Diagnosis not present

## 2017-02-27 DIAGNOSIS — L570 Actinic keratosis: Secondary | ICD-10-CM | POA: Diagnosis not present

## 2017-03-14 DIAGNOSIS — M5416 Radiculopathy, lumbar region: Secondary | ICD-10-CM | POA: Diagnosis not present

## 2017-03-27 DIAGNOSIS — R739 Hyperglycemia, unspecified: Secondary | ICD-10-CM | POA: Diagnosis not present

## 2017-03-27 DIAGNOSIS — E291 Testicular hypofunction: Secondary | ICD-10-CM | POA: Diagnosis not present

## 2017-03-27 DIAGNOSIS — I10 Essential (primary) hypertension: Secondary | ICD-10-CM | POA: Diagnosis not present

## 2017-03-27 DIAGNOSIS — Z125 Encounter for screening for malignant neoplasm of prostate: Secondary | ICD-10-CM | POA: Diagnosis not present

## 2017-03-27 DIAGNOSIS — Z5181 Encounter for therapeutic drug level monitoring: Secondary | ICD-10-CM | POA: Diagnosis not present

## 2017-03-27 DIAGNOSIS — M1000 Idiopathic gout, unspecified site: Secondary | ICD-10-CM | POA: Diagnosis not present

## 2017-03-30 DIAGNOSIS — H524 Presbyopia: Secondary | ICD-10-CM | POA: Diagnosis not present

## 2017-03-30 DIAGNOSIS — H52203 Unspecified astigmatism, bilateral: Secondary | ICD-10-CM | POA: Diagnosis not present

## 2017-03-30 DIAGNOSIS — Z961 Presence of intraocular lens: Secondary | ICD-10-CM | POA: Diagnosis not present

## 2017-03-30 DIAGNOSIS — H04123 Dry eye syndrome of bilateral lacrimal glands: Secondary | ICD-10-CM | POA: Diagnosis not present

## 2017-04-03 DIAGNOSIS — E291 Testicular hypofunction: Secondary | ICD-10-CM | POA: Diagnosis not present

## 2017-04-03 DIAGNOSIS — Z79899 Other long term (current) drug therapy: Secondary | ICD-10-CM | POA: Diagnosis not present

## 2017-04-03 DIAGNOSIS — Z0000 Encounter for general adult medical examination without abnormal findings: Secondary | ICD-10-CM | POA: Diagnosis not present

## 2017-04-03 DIAGNOSIS — M0589 Other rheumatoid arthritis with rheumatoid factor of multiple sites: Secondary | ICD-10-CM | POA: Diagnosis not present

## 2017-04-10 DIAGNOSIS — M069 Rheumatoid arthritis, unspecified: Secondary | ICD-10-CM | POA: Diagnosis not present

## 2017-04-26 DIAGNOSIS — R197 Diarrhea, unspecified: Secondary | ICD-10-CM | POA: Diagnosis not present

## 2017-04-26 DIAGNOSIS — R1084 Generalized abdominal pain: Secondary | ICD-10-CM | POA: Diagnosis not present

## 2017-04-26 DIAGNOSIS — R112 Nausea with vomiting, unspecified: Secondary | ICD-10-CM | POA: Diagnosis not present

## 2017-05-04 ENCOUNTER — Telehealth: Payer: Self-pay | Admitting: Internal Medicine

## 2017-05-04 NOTE — Telephone Encounter (Signed)
Pt states he had nausea and vomiting 3 days ago and now his stomach just hurts. He is on Protonix 40mg  daily. States he has started taking it BID now. Discussed with pt that he could add zantac in the morning to see if that helped and stay away from greasy spicy foods. Pt scheduled to see Amy Esterwood PA 05/09/17@3pm . Pt aware of appt.

## 2017-05-09 ENCOUNTER — Encounter (INDEPENDENT_AMBULATORY_CARE_PROVIDER_SITE_OTHER): Payer: Self-pay

## 2017-05-09 ENCOUNTER — Encounter: Payer: Self-pay | Admitting: Physician Assistant

## 2017-05-09 ENCOUNTER — Ambulatory Visit (INDEPENDENT_AMBULATORY_CARE_PROVIDER_SITE_OTHER): Payer: Medicare Other | Admitting: Physician Assistant

## 2017-05-09 ENCOUNTER — Other Ambulatory Visit (INDEPENDENT_AMBULATORY_CARE_PROVIDER_SITE_OTHER): Payer: Medicare Other

## 2017-05-09 VITALS — BP 102/58 | HR 84 | Ht 65.75 in | Wt 148.2 lb

## 2017-05-09 DIAGNOSIS — R112 Nausea with vomiting, unspecified: Secondary | ICD-10-CM | POA: Diagnosis not present

## 2017-05-09 DIAGNOSIS — Z1211 Encounter for screening for malignant neoplasm of colon: Secondary | ICD-10-CM

## 2017-05-09 DIAGNOSIS — R1084 Generalized abdominal pain: Secondary | ICD-10-CM

## 2017-05-09 DIAGNOSIS — R197 Diarrhea, unspecified: Secondary | ICD-10-CM | POA: Diagnosis not present

## 2017-05-09 DIAGNOSIS — K8020 Calculus of gallbladder without cholecystitis without obstruction: Secondary | ICD-10-CM | POA: Diagnosis not present

## 2017-05-09 DIAGNOSIS — R140 Abdominal distension (gaseous): Secondary | ICD-10-CM

## 2017-05-09 DIAGNOSIS — R1011 Right upper quadrant pain: Secondary | ICD-10-CM

## 2017-05-09 DIAGNOSIS — R1012 Left upper quadrant pain: Secondary | ICD-10-CM

## 2017-05-09 DIAGNOSIS — Z860101 Personal history of adenomatous and serrated colon polyps: Secondary | ICD-10-CM

## 2017-05-09 DIAGNOSIS — Z86010 Personal history of colonic polyps: Secondary | ICD-10-CM | POA: Diagnosis not present

## 2017-05-09 DIAGNOSIS — K8080 Other cholelithiasis without obstruction: Secondary | ICD-10-CM

## 2017-05-09 LAB — CBC WITH DIFFERENTIAL/PLATELET
BASOS ABS: 0 10*3/uL (ref 0.0–0.1)
Basophils Relative: 0.3 % (ref 0.0–3.0)
EOS ABS: 0.1 10*3/uL (ref 0.0–0.7)
Eosinophils Relative: 1.3 % (ref 0.0–5.0)
HCT: 40.3 % (ref 39.0–52.0)
Hemoglobin: 13.5 g/dL (ref 13.0–17.0)
LYMPHS ABS: 1.9 10*3/uL (ref 0.7–4.0)
Lymphocytes Relative: 18.4 % (ref 12.0–46.0)
MCHC: 33.5 g/dL (ref 30.0–36.0)
MCV: 94.5 fl (ref 78.0–100.0)
MONO ABS: 0.5 10*3/uL (ref 0.1–1.0)
Monocytes Relative: 4.6 % (ref 3.0–12.0)
NEUTROS ABS: 7.7 10*3/uL (ref 1.4–7.7)
NEUTROS PCT: 75.4 % (ref 43.0–77.0)
PLATELETS: 264 10*3/uL (ref 150.0–400.0)
RBC: 4.27 Mil/uL (ref 4.22–5.81)
RDW: 15.6 % — ABNORMAL HIGH (ref 11.5–15.5)
WBC: 10.2 10*3/uL (ref 4.0–10.5)

## 2017-05-09 LAB — COMPREHENSIVE METABOLIC PANEL
ALT: 15 U/L (ref 0–53)
AST: 18 U/L (ref 0–37)
Albumin: 4 g/dL (ref 3.5–5.2)
Alkaline Phosphatase: 76 U/L (ref 39–117)
BILIRUBIN TOTAL: 0.4 mg/dL (ref 0.2–1.2)
BUN: 20 mg/dL (ref 6–23)
CO2: 31 meq/L (ref 19–32)
CREATININE: 1.36 mg/dL (ref 0.40–1.50)
Calcium: 9.3 mg/dL (ref 8.4–10.5)
Chloride: 97 mEq/L (ref 96–112)
GFR: 54.66 mL/min — AB (ref >60.00)
GLUCOSE: 110 mg/dL — AB (ref 70–99)
Potassium: 4.4 mEq/L (ref 3.5–5.1)
SODIUM: 135 meq/L (ref 135–145)
Total Protein: 6.9 g/dL (ref 6.0–8.3)

## 2017-05-09 MED ORDER — NA SULFATE-K SULFATE-MG SULF 17.5-3.13-1.6 GM/177ML PO SOLN: 1.0000 | Freq: Once | ORAL | 0 refills | Status: AC

## 2017-05-09 NOTE — Patient Instructions (Addendum)
Please go to the basement level to have your labs drawn.  Continue Protonix 40 mg, take 1 tab by mouth twice daily for 6 weeks.  You have been scheduled for a colonoscopy. Please follow written instructions given to you at your visit today.  Please pick up your prep supplies at the pharmacy within the next 1-3 days. [facility] [place], [place]. If you use inhalers (even only as needed), please bring them with you on the day of your procedure. Your physician has requested that you go to www.startemmi.com and enter the access code given to you at your visit today. This web site gives a general overview about your procedure. However, you should still follow specific instructions given to you by our office regarding your preparation for the procedure.   You have been scheduled for a CT scan of the abdomen and pelvis at [facility] ([address]---this is in the same building as Press [facility]).   You are scheduled tomorrow Wednesday 05-10-2017 at 1:30 PM. You should arrive at 1:15 pm to your appointment time for registration. Please follow the written instructions below on the day of your exam:  WARNING: IF YOU ARE ALLERGIC TO IODINE/X-RAY DYE, PLEASE NOTIFY RADIOLOGY IMMEDIATELY AT [phone]! YOU WILL BE GIVEN A 13 HOUR PREMEDICATION PREP.  1) Do not eat  anything after 9:30 am (4 hours prior to your test) 2) You have been given 2 bottles of oral contrast to drink. The solution may taste               better if refrigerated, but do NOT add ice or any other liquid to this solution. Shake             well before drinking.    Drink 1 bottle of contrast @ 11:30 am (2 hours prior to your exam)  Drink 1 bottle of contrast @ 12:30 PM  (1 hour prior to your exam)  You may take any medications as prescribed with a small amount of water except for the following: Metformin, Glucophage, Glucovance, Avandamet, Riomet, Fortamet, Actoplus Met, Janumet, Glumetza or Metaglip. The above medications must be  held the day of the exam AND 48 hours after the exam.  The purpose of you drinking the oral contrast is to aid in the visualization of your intestinal tract. The contrast solution may cause some diarrhea. Before your exam is started, you will be given a small amount of fluid to drink. Depending on your individual set of symptoms, you may also receive an intravenous injection of x-ray contrast/dye. Plan on being at [facility] for 30 minutes or long, depending on the type of exam you are having performed.  If you have any questions regarding your exam or if you need to reschedule, you may call the CT department at [phone] between the hours of 8:00 am and 5:00 pm, Monday-Friday.  ________________________________________________________________________

## 2017-05-09 NOTE — Progress Notes (Signed)
Subjective:    Patient ID: [name], male    DOB: 12/20/1945, 71 y.o.   MRN: [id number]  HPI [name] is a pleasant 71 year old white male, known to Dr. Henrene Pastor from previous procedures, who comes in today with complaints of recurrent abdominal pain and a recent episode of diarrhea nausea vomiting abdominal pain and chills. 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 He last had colonoscopy in September 2015 for history of adenomatous polyps. He was found to have 6 polyps all 5-7 mm in size, 2 of these were tubular adenomas and 4 were sessile serrated adenomas. Also noted mild left colon diverticuli. He was to have 3 year interval follow-up. EGD was done in 2012 for complaints of abdominal pain and was normal. Reviewing his records he had an ultrasound done elsewhere in 2012 showing mobile gallstones and also sound was repeated in 2015 by his PCP showing layering gallstones and sludge and a normal CBD.   Patient stays on Protonix 40 mg chronically he says she's been having recurrent abdominal pain after eating over the past 3-4 months. He says typically the pain will start 15 or 20 minutes after a meal and may last an hour or so. He points to his mid abdomen he has not had radiation to his back. Generally no nausea or vomiting. Recently he has changed his diet is eating incredibly bland foods including bananas and plain chicken and has stopped drinking sweet tea which he thinks is helping. He denies any problems with heartburn or indigestion. He has  not been using any aspirin or NSAIDs, no EtOH.[name] Appetite has been fair, weight is down about 10 pounds after recent episode of nausea and vomiting, but back up 5 pounds over the past couple of weeks. He had one episode about 2 weeks ago with onset of diarrhea on a Sunday evening followed by nausea vomiting and mid abdominal pain followed by what sounds like shaking  chills and sweats. He says he was sick for about 3 days and then symptoms resolved.  Review of Systems  Pertinent positive and negative review of systems were noted in the above HPI section.  All other review of systems was otherwise negative.  Outpatient Encounter Prescriptions as of 05/09/2017  Medication Sig  . allopurinol (ZYLOPRIM) 300 MG tablet Take 300 mg by mouth daily.  . clonazePAM (KLONOPIN) 1 MG tablet Take 1 mg by mouth at bedtime.  . Cyanocobalamin (B-12) 1000 MCG CAPS Take by mouth daily.  [name] escitalopram (LEXAPRO) 20 MG tablet Take 20 mg by mouth daily.  . folic acid (FOLVITE) 1 MG tablet Take 1 mg by mouth daily.  [name] losartan (COZAAR) 50 MG tablet Take 50 mg by mouth daily.  . methotrexate (50 MG/ML) 1 G injection Inject into the vein once a week.  . Omega-3 Fatty Acids (FISH OIL) 1000 MG CPDR Take by mouth. Takes 6000 mg daily  . pantoprazole (PROTONIX) 40 MG tablet Take 40 mg by mouth 2 (two) times daily.   . predniSONE (DELTASONE) 5 MG tablet Take 5 mg by mouth daily with breakfast.  . traMADol-acetaminophen (ULTRACET) 37.5-325 MG per tablet Take 1 tablet by mouth every 6 (six) hours as needed.  . Na Sulfate-K Sulfate-Mg Sulf 17.5-3.13-1.6 GM/180ML SOLN Take 1 kit by mouth once.   No facility-administered encounter medications on file as of 05/09/2017.    No Known Allergies Patient Active Problem List   Diagnosis Date  Noted  . GERD 10/15/2010  . NAUSEA WITH VOMITING 10/15/2010  . COLONIC POLYPS 11/25/2008  . GASTRIC ULCER 11/25/2008  . DUODENAL ULCER 11/25/2008  . DIVERTICULOSIS, COLON 11/25/2008  . ARTHRITIS, RHEUMATOID 11/25/2008  . NAUSEA 11/25/2008  . ABDOMINAL PAIN-EPIGASTRIC 11/25/2008  . ABDOMINAL PAIN -GENERALIZED 11/25/2008   Social History   Social History  . Marital status: Married    Spouse name: N/A  . Number of children: N/A  . Years of education: N/A   Occupational History  . Not on file.   Social History Main Topics  . Smoking status:  Former Smoker    Packs/day: 2.00    Years: 20.00    Types: Cigarettes    Quit date: 08/08/1978  . Smokeless tobacco: Never Used  . Alcohol use No  . Drug use: No  . Sexual activity: Not on file   Other Topics Concern  . Not on file   Social History Narrative  . No narrative on file    Mr. Wenrich family history is not on file.      Objective:    Vitals:   05/09/17 1446  BP: (!) 102/58  Pulse: 84    Physical Exam  well-developed white male in no acute distress, older blood pressure 102/58 pulse 84, BMI 24.1. HEENT; nontraumatic normocephalic EOMI PERRLA sclera anicteric, Cardiovascular; regular rate and rhythm with S1-S2 no murmur or gallop, Pulmonary; clear bilaterally, Abdomen; soft, no focal tenderness today, he does have a small reducible umbilical hernia no palpable mass or hepatosplenomegaly bowel sounds are present, appendectomy scar. 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 Procedure discussed in detail with pt , including risks and benefits ,and he is agreeable to proceed Discussed likely need for surgical consult and Cholecystectomy - will await results of labs, CT   Bland, low fat diet  Malinda Mayden S Marria Mathison PA-C 05/09/2017   Cc: Tommy Medal,  MD

## 2017-05-10 ENCOUNTER — Ambulatory Visit (INDEPENDENT_AMBULATORY_CARE_PROVIDER_SITE_OTHER)
Admission: RE | Admit: 2017-05-10 | Discharge: 2017-05-10 | Disposition: A | Discharge status: Home / Self Care | Payer: Medicare Other | Source: Ambulatory Visit | Attending: Physician Assistant | Admitting: Physician Assistant

## 2017-05-10 DIAGNOSIS — R109 Unspecified abdominal pain: Secondary | ICD-10-CM | POA: Diagnosis not present

## 2017-05-10 DIAGNOSIS — R1011 Right upper quadrant pain: Secondary | ICD-10-CM

## 2017-05-10 DIAGNOSIS — K8080 Other cholelithiasis without obstruction: Secondary | ICD-10-CM

## 2017-05-10 DIAGNOSIS — R1110 Vomiting, unspecified: Secondary | ICD-10-CM | POA: Diagnosis not present

## 2017-05-10 DIAGNOSIS — R1012 Left upper quadrant pain: Secondary | ICD-10-CM

## 2017-05-10 DIAGNOSIS — R140 Abdominal distension (gaseous): Secondary | ICD-10-CM

## 2017-05-10 DIAGNOSIS — R197 Diarrhea, unspecified: Secondary | ICD-10-CM | POA: Diagnosis not present

## 2017-05-10 MED ORDER — IOPAMIDOL (ISOVUE-300) INJECTION 61%
100.0000 mL | Freq: Once | INTRAVENOUS | Status: AC | PRN
  Administered 2017-05-10: 100 mL via INTRAVENOUS

## 2017-05-10 NOTE — Progress Notes (Signed)
Initial assessment and plans reviewed 

## 2017-05-18 ENCOUNTER — Ambulatory Visit: Payer: Self-pay | Admitting: Gastroenterology

## 2017-05-22 ENCOUNTER — Telehealth: Payer: Self-pay | Admitting: *Deleted

## 2017-05-22 NOTE — Telephone Encounter (Signed)
Received fax on 05-18-2017 from [facility], patient has an appointment with Dr. Harlow Asa for 05-31-2017 . Appointment time is 9:45 am, Arrival time is 9:15 am. Someone from [facility] spoke to the patient to inform him of the appointment.

## 2017-05-31 ENCOUNTER — Ambulatory Visit: Payer: Self-pay | Admitting: Surgery

## 2017-05-31 ENCOUNTER — Encounter (HOSPITAL_COMMUNITY): Payer: Self-pay

## 2017-05-31 DIAGNOSIS — K429 Umbilical hernia without obstruction or gangrene: Secondary | ICD-10-CM | POA: Diagnosis not present

## 2017-05-31 DIAGNOSIS — K8010 Calculus of gallbladder with chronic cholecystitis without obstruction: Secondary | ICD-10-CM | POA: Diagnosis not present

## 2017-05-31 NOTE — Progress Notes (Signed)
Labs 05/09/17 epic cbc/diff, bmp epic Ct abd /pelvis epic showed lung bases clear

## 2017-05-31 NOTE — Patient Instructions (Signed)
[name]  05/31/2017   Your procedure is scheduled on: 06/06/17  Report to [facility] Main  Entrance Take [place]  elevators to 3rd floor to  [place] at      [phone].    Call this number if you have problems the morning of surgery [phone]    Remember: ONLY 1 PERSON MAY GO WITH YOU TO SHORT STAY TO GET  READY MORNING OF YOUR SURGERY.  Do not eat food or drink liquids :After Midnight.     Take these medicines the morning of surgery with A SIP OF WATER:  Protonix, allopurinol, lexapro                                You may not have any metal on your body including hair pins and              piercings  Do not wear jewelry, lotions, powders or perfumes, deodorant          .              Men may shave face and neck.   Do not bring valuables to the hospital. [place].  Contacts, dentures or bridgework may not be worn into surgery.  Leave suitcase in the car. After surgery it may be brought to your room.                 Please read over the following fact sheets you were given: _____________________________________________________________________          [facility] - Preparing for Surgery Before surgery, you can play an important role.  Because skin is not sterile, your skin needs to be as free of germs as possible.  You can reduce the number of germs on your skin by washing with CHG (chlorahexidine gluconate) soap before surgery.  CHG is an antiseptic cleaner which kills germs and bonds with the skin to continue killing germs even after washing. Please DO NOT use if you have an allergy to CHG or antibacterial soaps.  If your skin becomes reddened/irritated stop using the CHG and inform your nurse when you arrive at Short Stay. Do not shave (including legs and underarms) for at least 48 hours prior to the first CHG shower.  You may shave your face/neck. Please follow these instructions  carefully:  1.  Shower with CHG Soap the night before surgery and the  morning of Surgery.  2.  If you choose to wash your hair, wash your hair first as usual with your  normal  shampoo.  3.  After you shampoo, rinse your hair and body thoroughly to remove the  shampoo.                           4.  Use CHG as you would any other liquid soap.  You can apply chg directly  to the skin and wash                       Gently with a scrungie or clean washcloth.  5.  Apply the CHG Soap to your body ONLY FROM THE NECK DOWN.   Do not  use on face/ open                           Wound or open sores. Avoid contact with eyes, ears mouth and genitals (private parts).                       Wash face,  Genitals (private parts) with your normal soap.             6.  Wash thoroughly, paying special attention to the area where your surgery  will be performed.  7.  Thoroughly rinse your body with warm water from the neck down.  8.  DO NOT shower/wash with your normal soap after using and rinsing off  the CHG Soap.                9.  Pat yourself dry with a clean towel.            10.  Wear clean pajamas.            11.  Place clean sheets on your bed the night of your first shower and do not  sleep with pets. Day of Surgery : Do not apply any lotions/deodorants the morning of surgery.  Please wear clean clothes to the hospital/surgery center.  FAILURE TO FOLLOW THESE INSTRUCTIONS MAY RESULT IN THE CANCELLATION OF YOUR SURGERY PATIENT SIGNATURE_________________________________  NURSE SIGNATURE__________________________________  ________________________________________________________________________

## 2017-06-05 ENCOUNTER — Encounter (HOSPITAL_COMMUNITY): Payer: Self-pay | Admitting: Surgery

## 2017-06-05 ENCOUNTER — Encounter (HOSPITAL_COMMUNITY)
Admission: RE | Admit: 2017-06-05 | Discharge: 2017-06-05 | Disposition: A | Discharge status: Home / Self Care | Payer: Medicare Other | Source: Ambulatory Visit | Attending: Surgery | Admitting: Surgery

## 2017-06-05 ENCOUNTER — Encounter (HOSPITAL_COMMUNITY): Payer: Self-pay

## 2017-06-05 DIAGNOSIS — K219 Gastro-esophageal reflux disease without esophagitis: Secondary | ICD-10-CM | POA: Diagnosis not present

## 2017-06-05 DIAGNOSIS — K429 Umbilical hernia without obstruction or gangrene: Secondary | ICD-10-CM | POA: Diagnosis not present

## 2017-06-05 DIAGNOSIS — Z87891 Personal history of nicotine dependence: Secondary | ICD-10-CM | POA: Diagnosis not present

## 2017-06-05 DIAGNOSIS — F329 Major depressive disorder, single episode, unspecified: Secondary | ICD-10-CM | POA: Diagnosis not present

## 2017-06-05 DIAGNOSIS — K8010 Calculus of gallbladder with chronic cholecystitis without obstruction: Secondary | ICD-10-CM | POA: Diagnosis present

## 2017-06-05 DIAGNOSIS — M1990 Unspecified osteoarthritis, unspecified site: Secondary | ICD-10-CM | POA: Diagnosis not present

## 2017-06-05 DIAGNOSIS — Z86010 Personal history of colonic polyps: Secondary | ICD-10-CM | POA: Diagnosis not present

## 2017-06-05 DIAGNOSIS — Z8711 Personal history of peptic ulcer disease: Secondary | ICD-10-CM | POA: Diagnosis not present

## 2017-06-05 DIAGNOSIS — Z79899 Other long term (current) drug therapy: Secondary | ICD-10-CM | POA: Diagnosis not present

## 2017-06-05 DIAGNOSIS — E119 Type 2 diabetes mellitus without complications: Secondary | ICD-10-CM | POA: Diagnosis not present

## 2017-06-05 HISTORY — DX: Major depressive disorder, single episode, unspecified: F32.9

## 2017-06-05 HISTORY — DX: Depression, unspecified: F32.A

## 2017-06-05 NOTE — H&P (Signed)
General Surgery [facility]  [name] 05/31/2017 9:33 AM Location: [facility] Patient #: [id number] DOB: 1945/11/25 Married / Language: English / Race: White Male   History of Present Illness Christian Regal MD; 05/31/2017 10:00 AM) The patient is a 71 year old male who presents for evaluation of gall stones.  CC: chronic cholecystitis, cholelithiasis, umbilical hernia  Patient is referred by his primary care physician, Dr. Jani Sparks, and his gastroenterologist, Dr. Scarlette Sparks and [name], for evaluation and management of this chronic cholecystitis and cholelithiasis. Patient has had known cholelithiasis for approximately 4 years. He has had intermittent epigastric and right upper quadrant abdominal pain. He has a long-standing history of peptic ulcer disease and gastroesophageal reflux. He is currently taking proton pump inhibitors. Patient has had a few episodes of more severe abdominal pain associated with nausea and vomiting and diarrhea. He was evaluated by gastroenterology including a CT scan of the abdomen and pelvis on May 10, 2017. This showed calcified gallstones. Previous abdominal ultrasound showed multiple gallstones and gallbladder sludge. Patient has had no history of jaundice or acholic stools. He denies fevers or chills. There is no family history of gallbladder disease. Previous abdominal surgery includes appendectomy and right inguinal hernia repair many years ago. Patient has no other history of hepatobiliary or pancreatic disease.   Past Surgical History Christian Lorenzo, LPN; 01/01/7680 1:57 AM) Colon Polyp Removal - Colonoscopy  Spinal Surgery - Lower Back   Allergies Christian Lorenzo, LPN; 2/62/0355 9:74 AM) No Known Drug Allergies 05/31/2017 Allergies Reconciled   Medication History Christian Lorenzo, LPN; 1/63/8453 6:46 AM) Tramadol-Acetaminophen (37.5-325MG  Tablet, Oral) Active. Allopurinol (300MG  Tablet,  Oral) Active. Escitalopram Oxalate (20MG  Tablet, Oral) Active. Losartan Potassium (50MG  Tablet, Oral) Active. Methotrexate Sodium (50MG /2ML Solution, Injection) Active. Pantoprazole Sodium (40MG  Tablet DR, Oral) Active. PredniSONE (5MG  Tablet, Oral) Active. TraZODone HCl (50MG  Tablet, Oral) Active. B-12 (100MCG Tablet, Oral) Active. Folic Acid (20MG  Capsule, Oral) Active. Cozaar (50MG  Tablet, Oral) Active. Medications Reconciled  Social History Christian Lorenzo, LPN; 04/07/2121 4:82 AM) Alcohol use  Remotely quit alcohol use. Caffeine use  Tea. No drug use  Tobacco use  Former smoker.  Family History Christian Lorenzo, LPN; 5/00/3704 8:88 AM) Arthritis  Mother. Cerebrovascular Accident  Mother.  Other Problems Christian Lorenzo, LPN; 05/21/9449 3:88 AM) Arthritis  Back Pain  Depression  Diabetes Mellitus  Gastroesophageal Reflux Disease     Review of Systems Christian Billings Dockery LPN; 05/02/33 9:17 AM) General Not Present- Appetite Loss, Chills, Fatigue, Fever, Night Sweats, Weight Gain and Weight Loss. Skin Present- Dryness and Non-Healing Wounds. Not Present- Change in Wart/Mole, Hives, Jaundice, New Lesions, Rash and Ulcer. HEENT Present- Seasonal Allergies. Not Present- Earache, Hearing Loss, Hoarseness, Nose Bleed, Oral Ulcers, Ringing in the Ears, Sinus Pain, Sore Throat, Visual Disturbances, Wears glasses/contact lenses and Yellow Eyes. Respiratory Not Present- Bloody sputum, Chronic Cough, Difficulty Breathing, Snoring and Wheezing. Breast Not Present- Breast Mass, Breast Pain, Nipple Discharge and Skin Changes. Cardiovascular Not Present- Chest Pain, Difficulty Breathing Lying Down, Leg Cramps, Palpitations, Rapid Heart Rate, Shortness of Breath and Swelling of Extremities. Gastrointestinal Present- Bloating. Not Present- Abdominal Pain, Bloody Stool, Change in Bowel Habits, Chronic diarrhea, Constipation, Difficulty Swallowing, Excessive gas, Gets full quickly at  meals, Hemorrhoids, Indigestion, Nausea, Rectal Pain and Vomiting. Male Genitourinary Not Present- Blood in Urine, Change in Urinary Stream, Frequency, Impotence, Nocturia, Painful Urination, Urgency and Urine Leakage. Musculoskeletal Present- Back Pain, Joint Pain, Joint Stiffness and Swelling of Extremities. Not Present-  Muscle Pain and Muscle Weakness. Neurological Not Present- Decreased Memory, Fainting, Headaches, Numbness, Seizures, Tingling, Tremor, Trouble walking and Weakness. Psychiatric Present- Depression. Not Present- Anxiety, Bipolar, Change in Sleep Pattern, Fearful and Frequent crying. Endocrine Not Present- Cold Intolerance, Excessive Hunger, Hair Changes, Heat Intolerance, Hot flashes and New Diabetes. Hematology Present- Easy Bruising. Not Present- Blood Thinners, Excessive bleeding, Gland problems, HIV and Persistent Infections.  Vitals Christian Billings Dockery LPN; 9/32/3557 3:22 AM) 05/31/2017 9:35 AM Weight: 155.4 lb Height: 66in Body Surface Area: 1.8 m Body Mass Index: 25.08 kg/m  Temp.: 98.72F(Oral)  Pulse: 76 (Regular)  BP: 140/70 (Sitting, Left Arm, Standard)       Physical Exam Christian Regal MD; 05/31/2017 10:01 AM) The physical exam findings are as follows: Note:CONSTITUTIONAL See vital signs recorded above  GENERAL APPEARANCE Development: normal Nutritional status: normal Gross deformities: none  SKIN Rash, lesions, ulcers: none Induration, erythema: none Nodules: none palpable  EYES Conjunctiva and lids: normal Pupils: equal and reactive Iris: normal bilaterally  EARS, NOSE, MOUTH, THROAT External ears: no lesion or deformity External nose: no lesion or deformity Hearing: grossly normal Lips: no lesion or deformity Dentition: normal for age Oral mucosa: moist  NECK Symmetric: yes Trachea: midline Thyroid: no palpable nodules in the thyroid bed  CHEST Respiratory effort: normal Retraction or accessory muscle use: no Breath  sounds: normal bilaterally Rales, rhonchi, wheeze: none  CARDIOVASCULAR Auscultation: regular rhythm, normal rate Murmurs: none Pulses: carotid and radial pulse 2+ palpable Lower extremity edema: none Lower extremity varicosities: none  ABDOMEN Distension: none Masses: none palpable Tenderness: none Hepatosplenomegaly: not present Hernia: umbilical hernia, reducible  MUSCULOSKELETAL Station and gait: normal Digits and nails: no clubbing or cyanosis Muscle strength: grossly normal all extremities Range of motion: grossly normal all extremities Deformity: none  LYMPHATIC Cervical: none palpable Supraclavicular: none palpable  PSYCHIATRIC Oriented to person, place, and time: yes Mood and affect: normal for situation Judgment and insight: appropriate for situation    Assessment & Plan Christian Regal MD; 05/31/2017 10:03 AM)  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 Patient is referred by gastroenterology and his primary care physician for evaluation and management of symptomatic cholelithiasis, and chronic cholecystitis. Patient is provided with written literature on gallbladder surgery to review at home.  I have recommended proceeding with laparoscopic cholecystectomy with intraoperative cholangiography. We will plan to primarily repair his umbilical hernia at the time of surgery. We discussed the risk and benefits of the procedure including the potential for recurrence of this hernia. We also discussed the possibility of conversion to open surgery if necessary during his cholecystectomy. We discussed the hospital stay to be anticipated. We discussed his postoperative recovery and return to activities. Patient understands and wishes to proceed with surgery in the  near future.  The risks and benefits of the procedure have been discussed at length with the patient. The patient understands the proposed procedure, potential alternative treatments, and the course of recovery to be expected. All of the patient's questions have been answered at this time. The patient wishes to proceed with surgery.  [name], [facility] Office: [phone]

## 2017-06-06 ENCOUNTER — Ambulatory Visit (HOSPITAL_COMMUNITY): Payer: Medicare Other | Admitting: Anesthesiology

## 2017-06-06 ENCOUNTER — Ambulatory Visit (HOSPITAL_COMMUNITY): Payer: Medicare Other

## 2017-06-06 ENCOUNTER — Observation Stay (HOSPITAL_COMMUNITY)
Admission: RE | Admit: 2017-06-06 | Discharge: 2017-06-06 | Disposition: A | Discharge status: Home / Self Care | Payer: Medicare Other | Source: Ambulatory Visit | Attending: Surgery | Admitting: Surgery

## 2017-06-06 ENCOUNTER — Encounter (HOSPITAL_COMMUNITY)
Admission: RE | Disposition: A | Discharge status: Home / Self Care | Payer: Self-pay | Source: Ambulatory Visit | Attending: Surgery

## 2017-06-06 ENCOUNTER — Encounter (HOSPITAL_COMMUNITY): Payer: Self-pay

## 2017-06-06 DIAGNOSIS — Z8711 Personal history of peptic ulcer disease: Secondary | ICD-10-CM | POA: Insufficient documentation

## 2017-06-06 DIAGNOSIS — Z79899 Other long term (current) drug therapy: Secondary | ICD-10-CM | POA: Insufficient documentation

## 2017-06-06 DIAGNOSIS — K219 Gastro-esophageal reflux disease without esophagitis: Secondary | ICD-10-CM | POA: Insufficient documentation

## 2017-06-06 DIAGNOSIS — K429 Umbilical hernia without obstruction or gangrene: Secondary | ICD-10-CM | POA: Diagnosis not present

## 2017-06-06 DIAGNOSIS — E119 Type 2 diabetes mellitus without complications: Secondary | ICD-10-CM | POA: Insufficient documentation

## 2017-06-06 DIAGNOSIS — Z87891 Personal history of nicotine dependence: Secondary | ICD-10-CM | POA: Insufficient documentation

## 2017-06-06 DIAGNOSIS — M1990 Unspecified osteoarthritis, unspecified site: Secondary | ICD-10-CM | POA: Diagnosis not present

## 2017-06-06 DIAGNOSIS — Z419 Encounter for procedure for purposes other than remedying health state, unspecified: Secondary | ICD-10-CM

## 2017-06-06 DIAGNOSIS — K259 Gastric ulcer, unspecified as acute or chronic, without hemorrhage or perforation: Secondary | ICD-10-CM | POA: Diagnosis not present

## 2017-06-06 DIAGNOSIS — Z86010 Personal history of colonic polyps: Secondary | ICD-10-CM | POA: Insufficient documentation

## 2017-06-06 DIAGNOSIS — K8020 Calculus of gallbladder without cholecystitis without obstruction: Secondary | ICD-10-CM | POA: Diagnosis not present

## 2017-06-06 DIAGNOSIS — K8010 Calculus of gallbladder with chronic cholecystitis without obstruction: Principal | ICD-10-CM | POA: Diagnosis present

## 2017-06-06 DIAGNOSIS — F329 Major depressive disorder, single episode, unspecified: Secondary | ICD-10-CM | POA: Insufficient documentation

## 2017-06-06 DIAGNOSIS — K269 Duodenal ulcer, unspecified as acute or chronic, without hemorrhage or perforation: Secondary | ICD-10-CM | POA: Diagnosis not present

## 2017-06-06 HISTORY — PX: CHOLECYSTECTOMY: SHX55

## 2017-06-06 HISTORY — PX: UMBILICAL HERNIA REPAIR: SHX196

## 2017-06-06 SURGERY — LAPAROSCOPIC CHOLECYSTECTOMY WITH INTRAOPERATIVE CHOLANGIOGRAM
Anesthesia: General

## 2017-06-06 MED ORDER — PREDNISONE 5 MG PO TABS: 5.0000 mg | ORAL_TABLET | Freq: Every day | ORAL | Status: DC

## 2017-06-06 MED ORDER — SUGAMMADEX SODIUM 200 MG/2ML IV SOLN
INTRAVENOUS | Status: AC
  Filled 2017-06-06: qty 2

## 2017-06-06 MED ORDER — EPHEDRINE 5 MG/ML INJ
INTRAVENOUS | Status: AC
  Filled 2017-06-06: qty 10

## 2017-06-06 MED ORDER — ACETAMINOPHEN 325 MG PO TABS: 650.0000 mg | ORAL_TABLET | Freq: Four times a day (QID) | ORAL | Status: DC | PRN

## 2017-06-06 MED ORDER — CHLORHEXIDINE GLUCONATE CLOTH 2 % EX PADS: 6.0000 | MEDICATED_PAD | Freq: Once | CUTANEOUS | Status: DC

## 2017-06-06 MED ORDER — PROPOFOL 10 MG/ML IV BOLUS
INTRAVENOUS | Status: DC | PRN
  Administered 2017-06-06: 150 mg via INTRAVENOUS

## 2017-06-06 MED ORDER — SUGAMMADEX SODIUM 200 MG/2ML IV SOLN
INTRAVENOUS | Status: DC | PRN
  Administered 2017-06-06: 200 mg via INTRAVENOUS

## 2017-06-06 MED ORDER — BUPIVACAINE-EPINEPHRINE (PF) 0.25% -1:200000 IJ SOLN
INTRAMUSCULAR | Status: AC
  Filled 2017-06-06: qty 30

## 2017-06-06 MED ORDER — DEXAMETHASONE SODIUM PHOSPHATE 10 MG/ML IJ SOLN
INTRAMUSCULAR | Status: DC | PRN
  Administered 2017-06-06: 10 mg via INTRAVENOUS

## 2017-06-06 MED ORDER — CEFAZOLIN SODIUM-DEXTROSE 2-4 GM/100ML-% IV SOLN
INTRAVENOUS | Status: AC
  Filled 2017-06-06: qty 100

## 2017-06-06 MED ORDER — ONDANSETRON HCL 4 MG/2ML IJ SOLN
INTRAMUSCULAR | Status: AC
Start: 2017-06-06 — End: 2017-06-06
  Filled 2017-06-06: qty 2

## 2017-06-06 MED ORDER — ACETAMINOPHEN 10 MG/ML IV SOLN
1000.0000 mg | Freq: Once | INTRAVENOUS | Status: AC
  Administered 2017-06-06: 1000 mg via INTRAVENOUS

## 2017-06-06 MED ORDER — SUCCINYLCHOLINE CHLORIDE 200 MG/10ML IV SOSY
PREFILLED_SYRINGE | INTRAVENOUS | Status: AC
  Filled 2017-06-06: qty 10

## 2017-06-06 MED ORDER — ACETAMINOPHEN 10 MG/ML IV SOLN
INTRAVENOUS | Status: AC
  Filled 2017-06-06: qty 100

## 2017-06-06 MED ORDER — HYDROMORPHONE HCL 1 MG/ML IJ SOLN
1.0000 mg | INTRAMUSCULAR | Status: DC | PRN
  Administered 2017-06-06: 1 mg via INTRAVENOUS
  Filled 2017-06-06: qty 1

## 2017-06-06 MED ORDER — BUPIVACAINE-EPINEPHRINE 0.25% -1:200000 IJ SOLN
INTRAMUSCULAR | Status: DC | PRN
  Administered 2017-06-06: 28 mL

## 2017-06-06 MED ORDER — CEFAZOLIN SODIUM-DEXTROSE 2-4 GM/100ML-% IV SOLN
2.0000 g | INTRAVENOUS | Status: AC
  Administered 2017-06-06: 2 g via INTRAVENOUS
  Filled 2017-06-06: qty 100

## 2017-06-06 MED ORDER — LIDOCAINE 2% (20 MG/ML) 5 ML SYRINGE
INTRAMUSCULAR | Status: DC | PRN
  Administered 2017-06-06: 60 mg via INTRAVENOUS

## 2017-06-06 MED ORDER — IOPAMIDOL (ISOVUE-300) INJECTION 61%
INTRAVENOUS | Status: DC | PRN
  Administered 2017-06-06: 7 mL

## 2017-06-06 MED ORDER — SUCCINYLCHOLINE CHLORIDE 200 MG/10ML IV SOSY
PREFILLED_SYRINGE | INTRAVENOUS | Status: DC | PRN
  Administered 2017-06-06: 100 mg via INTRAVENOUS

## 2017-06-06 MED ORDER — LOSARTAN POTASSIUM 50 MG PO TABS: 50.0000 mg | ORAL_TABLET | Freq: Every day | ORAL | Status: DC

## 2017-06-06 MED ORDER — ROCURONIUM BROMIDE 50 MG/5ML IV SOSY
PREFILLED_SYRINGE | INTRAVENOUS | Status: AC
  Filled 2017-06-06: qty 5

## 2017-06-06 MED ORDER — ONDANSETRON HCL 4 MG/2ML IJ SOLN
INTRAMUSCULAR | Status: DC | PRN
  Administered 2017-06-06: 4 mg via INTRAVENOUS

## 2017-06-06 MED ORDER — LACTATED RINGERS IV SOLN
INTRAVENOUS | Status: DC | PRN
  Administered 2017-06-06: 07:00:00 via INTRAVENOUS

## 2017-06-06 MED ORDER — PANTOPRAZOLE SODIUM 40 MG PO TBEC
40.0000 mg | DELAYED_RELEASE_TABLET | Freq: Two times a day (BID) | ORAL | Status: DC
  Administered 2017-06-06: 40 mg via ORAL
  Filled 2017-06-06: qty 1

## 2017-06-06 MED ORDER — ACETAMINOPHEN 650 MG RE SUPP: 650.0000 mg | Freq: Four times a day (QID) | RECTAL | Status: DC | PRN

## 2017-06-06 MED ORDER — LACTATED RINGERS IR SOLN
Status: DC | PRN
  Administered 2017-06-06: 1000 mL

## 2017-06-06 MED ORDER — GLYCOPYRROLATE 0.2 MG/ML IJ SOLN
INTRAMUSCULAR | Status: DC | PRN
  Administered 2017-06-06: 0.6 mg via INTRAVENOUS

## 2017-06-06 MED ORDER — DEXAMETHASONE SODIUM PHOSPHATE 10 MG/ML IJ SOLN
INTRAMUSCULAR | Status: AC
  Filled 2017-06-06: qty 1

## 2017-06-06 MED ORDER — IOPAMIDOL (ISOVUE-300) INJECTION 61%
INTRAVENOUS | Status: AC
  Filled 2017-06-06: qty 50

## 2017-06-06 MED ORDER — ONDANSETRON HCL 4 MG/2ML IJ SOLN: 4.0000 mg | Freq: Four times a day (QID) | INTRAMUSCULAR | Status: DC | PRN

## 2017-06-06 MED ORDER — ONDANSETRON 4 MG PO TBDP: 4.0000 mg | ORAL_TABLET | Freq: Four times a day (QID) | ORAL | Status: DC | PRN

## 2017-06-06 MED ORDER — LIDOCAINE 2% (20 MG/ML) 5 ML SYRINGE
INTRAMUSCULAR | Status: AC
  Filled 2017-06-06: qty 5

## 2017-06-06 MED ORDER — PROMETHAZINE HCL 25 MG/ML IJ SOLN: 6.2500 mg | INTRAMUSCULAR | Status: DC | PRN

## 2017-06-06 MED ORDER — TRAMADOL HCL 50 MG PO TABS: 50.0000 mg | ORAL_TABLET | Freq: Four times a day (QID) | ORAL | Status: DC | PRN

## 2017-06-06 MED ORDER — PROPOFOL 10 MG/ML IV BOLUS
INTRAVENOUS | Status: AC
  Filled 2017-06-06: qty 20

## 2017-06-06 MED ORDER — LACTATED RINGERS IV SOLN: INTRAVENOUS | Status: DC | PRN

## 2017-06-06 MED ORDER — FENTANYL CITRATE (PF) 100 MCG/2ML IJ SOLN
INTRAMUSCULAR | Status: AC
  Filled 2017-06-06: qty 2

## 2017-06-06 MED ORDER — FENTANYL CITRATE (PF) 100 MCG/2ML IJ SOLN
INTRAMUSCULAR | Status: DC | PRN
  Administered 2017-06-06: 25 ug via INTRAVENOUS
  Administered 2017-06-06: 50 ug via INTRAVENOUS
  Administered 2017-06-06: 25 ug via INTRAVENOUS

## 2017-06-06 MED ORDER — LACTATED RINGERS IV SOLN
INTRAVENOUS | Status: DC
  Administered 2017-06-06: 09:00:00 via INTRAVENOUS

## 2017-06-06 MED ORDER — KCL IN DEXTROSE-NACL 20-5-0.45 MEQ/L-%-% IV SOLN
INTRAVENOUS | Status: DC
  Administered 2017-06-06: 11:00:00 via INTRAVENOUS
  Filled 2017-06-06: qty 1000

## 2017-06-06 MED ORDER — 0.9 % SODIUM CHLORIDE (POUR BTL) OPTIME
TOPICAL | Status: DC | PRN
  Administered 2017-06-06: 1000 mL

## 2017-06-06 MED ORDER — INFLUENZA VAC SPLIT HIGH-DOSE 0.5 ML IM SUSY: 0.5000 mL | PREFILLED_SYRINGE | INTRAMUSCULAR | Status: DC

## 2017-06-06 MED ORDER — TRAZODONE HCL 50 MG PO TABS: 50.0000 mg | ORAL_TABLET | Freq: Every day | ORAL | Status: DC

## 2017-06-06 MED ORDER — HYDROMORPHONE HCL-NACL 0.5-0.9 MG/ML-% IV SOSY
0.2500 mg | PREFILLED_SYRINGE | INTRAVENOUS | Status: DC | PRN
  Administered 2017-06-06 (×2): 0.5 mg via INTRAVENOUS

## 2017-06-06 MED ORDER — TRAMADOL HCL 50 MG PO TABS: 50.0000 mg | ORAL_TABLET | Freq: Four times a day (QID) | ORAL | 0 refills | Status: DC | PRN

## 2017-06-06 MED ORDER — HYDROMORPHONE HCL-NACL 0.5-0.9 MG/ML-% IV SOSY
PREFILLED_SYRINGE | INTRAVENOUS | Status: DC
Start: 2017-06-06 — End: 2017-06-06
  Filled 2017-06-06: qty 2

## 2017-06-06 MED ORDER — ROCURONIUM BROMIDE 50 MG/5ML IV SOSY
PREFILLED_SYRINGE | INTRAVENOUS | Status: DC | PRN
  Administered 2017-06-06: 10 mg via INTRAVENOUS
  Administered 2017-06-06: 30 mg via INTRAVENOUS

## 2017-06-06 SURGICAL SUPPLY — 59 items
APPLIER CLIP ROT 10 11.4 M/L (STAPLE) ×3
APR CLP MED LRG 11.4X10 (STAPLE) ×1
BAG SPEC RTRVL LRG 6X4 10 (ENDOMECHANICALS) ×1
BINDER ABDOMINAL 12 ML 46-62 (SOFTGOODS) IMPLANT
CABLE HIGH FREQUENCY MONO STRZ (ELECTRODE) ×3 IMPLANT
CHLORAPREP W/TINT 26ML (MISCELLANEOUS) ×4 IMPLANT
CLIP APPLIE ROT 10 11.4 M/L (STAPLE) ×1 IMPLANT
CLOSURE WOUND 1/2 X4 (GAUZE/BANDAGES/DRESSINGS) ×1
COVER MAYO STAND STRL (DRAPES) ×3 IMPLANT
COVER SURGICAL LIGHT HANDLE (MISCELLANEOUS) ×3 IMPLANT
DECANTER SPIKE VIAL GLASS SM (MISCELLANEOUS) ×3 IMPLANT
DEVICE TROCAR PUNCTURE CLOSURE (ENDOMECHANICALS) ×3 IMPLANT
DISSECTOR BLUNT TIP ENDO 5MM (MISCELLANEOUS) IMPLANT
DRAIN CHANNEL RND F F (WOUND CARE) IMPLANT
DRAPE C-ARM 42X120 X-RAY (DRAPES) ×3 IMPLANT
DRAPE INCISE IOBAN 66X45 STRL (DRAPES) ×3 IMPLANT
DRSG TEGADERM 2-3/8X2-3/4 SM (GAUZE/BANDAGES/DRESSINGS) ×2 IMPLANT
ELECT PENCIL ROCKER SW 15FT (MISCELLANEOUS) ×2 IMPLANT
ELECT REM PT RETURN 15FT ADLT (MISCELLANEOUS) ×3 IMPLANT
EVACUATOR SILICONE 100CC (DRAIN) IMPLANT
GAUZE SPONGE 2X2 8PLY STRL LF (GAUZE/BANDAGES/DRESSINGS) ×1 IMPLANT
GLOVE BIOGEL PI IND STRL 7.0 (GLOVE) ×1 IMPLANT
GLOVE BIOGEL PI INDICATOR 7.0 (GLOVE) ×2
GLOVE SURG ORTHO 8.0 STRL STRW (GLOVE) ×3 IMPLANT
GOWN STRL REUS W/TWL LRG LVL3 (GOWN DISPOSABLE) ×3 IMPLANT
GOWN STRL REUS W/TWL XL LVL3 (GOWN DISPOSABLE) ×6 IMPLANT
HEMOSTAT SURGICEL 4X8 (HEMOSTASIS) IMPLANT
KIT BASIN OR (CUSTOM PROCEDURE TRAY) ×3 IMPLANT
MARKER SKIN DUAL TIP RULER LAB (MISCELLANEOUS) ×3 IMPLANT
NDL SPNL 22GX3.5 QUINCKE BK (NEEDLE) ×1 IMPLANT
NEEDLE SPNL 22GX3.5 QUINCKE BK (NEEDLE) ×3 IMPLANT
POUCH SPECIMEN RETRIEVAL 10MM (ENDOMECHANICALS) ×3 IMPLANT
SCISSORS LAP 5X35 DISP (ENDOMECHANICALS) ×3 IMPLANT
SET CHOLANGIOGRAPH MIX (MISCELLANEOUS) ×3 IMPLANT
SET IRRIG TUBING LAPAROSCOPIC (IRRIGATION / IRRIGATOR) ×3 IMPLANT
SHEARS HARMONIC ACE PLUS 36CM (ENDOMECHANICALS) ×1 IMPLANT
SLEEVE XCEL OPT CAN 5 100 (ENDOMECHANICALS) ×3 IMPLANT
SOLUTION ANTI FOG 6CC (MISCELLANEOUS) ×3 IMPLANT
SPONGE GAUZE 2X2 STER 10/PKG (GAUZE/BANDAGES/DRESSINGS) ×2
STAPLER VISISTAT 35W (STAPLE) IMPLANT
STRIP CLOSURE SKIN 1/2X4 (GAUZE/BANDAGES/DRESSINGS) ×3 IMPLANT
SUT MNCRL AB 4-0 PS2 18 (SUTURE) ×3 IMPLANT
SUT NOVA 0 T19/GS 22DT (SUTURE) IMPLANT
SUT NOVA 1 T20/GS 25DT (SUTURE) IMPLANT
SUT NOVA NAB DX-16 0-1 5-0 T12 (SUTURE) ×2 IMPLANT
SUT PROLENE 0 CT 1 CR/8 (SUTURE) IMPLANT
SUT VIC AB 3-0 SH 27 (SUTURE) ×3
SUT VIC AB 3-0 SH 27XBRD (SUTURE) IMPLANT
SUT VIC AB 4-0 SH 18 (SUTURE) ×3 IMPLANT
SYR 30ML LL (SYRINGE) ×3 IMPLANT
TACKER 5MM HERNIA 3.5CML NAB (ENDOMECHANICALS) ×3 IMPLANT
TOWEL OR 17X26 10 PK STRL BLUE (TOWEL DISPOSABLE) ×3 IMPLANT
TOWEL OR NON WOVEN STRL DISP B (DISPOSABLE) ×3 IMPLANT
TRAY LAPAROSCOPIC (CUSTOM PROCEDURE TRAY) ×3 IMPLANT
TROCAR BLADELESS OPT 5 100 (ENDOMECHANICALS) ×3 IMPLANT
TROCAR XCEL BLUNT TIP 100MML (ENDOMECHANICALS) ×3 IMPLANT
TROCAR XCEL NON-BLD 11X100MML (ENDOMECHANICALS) ×3 IMPLANT
TROCAR XCEL UNIV SLVE 11M 100M (ENDOMECHANICALS) IMPLANT
TUBING INSUF HEATED (TUBING) ×3 IMPLANT

## 2017-06-06 NOTE — Discharge Summary (Signed)
Physician Discharge Summary [facility]  Patient ID: [name] MRN: [id number] DOB/AGE: 11/01/1945 71 y.o.  Admit date: 06/06/2017 Discharge date: 06/06/2017  Admission Diagnoses:  Chronic cholecystitis, umbilical hernia  Discharge Diagnoses:  Principal Problem:   Cholelithiasis with chronic cholecystitis Active Problems:   Umbilical hernia   Discharged Condition: good  Hospital Course: Patient was admitted for observation following gallbladder and hernia surgery.  Post op course was uncomplicated.  Pain was well controlled.  Tolerated diet.  Patient was prepared for discharge home on POD#0.  Consults: None  Treatments: surgery: lap chole with IOC, primary repair umbilical hernia  Discharge Exam: Blood pressure 135/70, pulse 72, temperature 97.8 F (36.6 C), temperature source Oral, resp. rate 17, height 5\' 6"  (1.676 m), weight 68 kg (150 lb), SpO2 100 %. HEENT - clear Neck - soft Chest - clear bilaterally Cor - RRR Abd - wounds dry and intact with steristrips in place - will add dressings  Disposition: Home  Discharge Instructions    Diet - low sodium heart healthy    Complete by:  As directed    Discharge instructions    Complete by:  As directed    [facility]  LAPAROSCOPIC SURGERY:  POST-OP INSTRUCTIONS  Always review your discharge instruction sheet given to you by the facility where your surgery was performed.  A prescription for pain medication may be given to you upon discharge.  Take your pain medication as prescribed.  If narcotic pain medicine is not needed, then you may take acetaminophen (Tylenol) or ibuprofen (Advil) as needed.  Take your usually prescribed medications unless otherwise directed.  If you need a refill on your pain medication, please contact your pharmacy.  They will contact our office to request authorization. Prescriptions will not be filled after 5 P.M. or on weekends.  You should follow a  light diet the first few days after arrival home, such as soup and crackers or toast.  Be sure to include plenty of fluids daily.  Most patients will experience some swelling and bruising in the area of the incisions.  Ice packs will help.  Swelling and bruising can take several days to resolve.   It is common to experience some constipation after surgery.  Increasing fluid intake and taking a stool softener (such as Colace) will usually help or prevent this problem from occurring.  A mild laxative (Milk of Magnesia or Miralax) should be taken according to package instructions if there has been no bowel movement after 48 hours.  You will have steri-strips and a gauze dressing over your incisions.  You may remove the gauze bandage on the second day after surgery, and you may shower at that time.  Leave your steri-strips (small skin tapes) in place directly over the incision.  These strips should remain on the skin for 5-7 days and then be removed.  You may get them wet in the shower and pat them dry.  Any sutures or staples will be removed at the office during your follow-up visit.  ACTIVITIES:  You may resume regular (light) daily activities beginning the next day - such as daily self-care, walking, climbing stairs - gradually increasing activities as tolerated.  You may have sexual intercourse when it is comfortable.  Refrain from any heavy lifting or straining until approved by your doctor.  You may drive when you are no longer taking prescription pain medication, you can comfortably wear a seatbelt, and you can safely maneuver your car  and apply brakes.  You should see your doctor in the office for a follow-up appointment approximately 2-3 weeks after your surgery.  Make sure that you call for this appointment within a day or two after you arrive home to insure a convenient appointment time.  WHEN TO CALL YOUR DOCTOR: Fever over 101.0 Inability to urinate Continued bleeding from  incision Increased pain, redness, or drainage from the incision Increasing abdominal pain  The clinic staff is available to answer your questions during regular business hours.  Please don't hesitate to call and ask to speak to one of the nurses for clinical concerns.  If you have a medical emergency, go to the nearest emergency room or call 911.  A surgeon from [facility] is always on call for the hospital.  [name], MD, [facility] Office: [place]:  [place] [phone]  Website: [email or web address]   Increase activity slowly    Complete by:  As directed    Remove dressing in 48 hours    Complete by:  As directed      Allergies as of 06/06/2017   No Known Allergies     Medication List    TAKE these medications   allopurinol 300 MG tablet Commonly known as:  ZYLOPRIM Take 300 mg by mouth daily.   B-12 1000 MCG Caps Take 1 capsule by mouth every evening.   CENTRUM SILVER 50+MEN Tabs Take 1 tablet by mouth daily.   escitalopram 20 MG tablet Commonly known as:  LEXAPRO Take 20 mg by mouth at bedtime.   Fish Oil 1200 MG Caps Take 6,000 mg by mouth daily. Takes 5 capsules every morning   folic acid 1 MG tablet Commonly known as:  FOLVITE Take 1 mg by mouth daily.   loratadine 10 MG tablet Commonly known as:  CLARITIN Take 10 mg by mouth daily as needed for allergies.   losartan 50 MG tablet Commonly known as:  COZAAR Take 50 mg by mouth at bedtime.   methotrexate 1 g injection Commonly known as:  50 mg/ml Inject 25 mg into the vein once a week. On Fridays   pantoprazole 40 MG tablet Commonly known as:  PROTONIX Take 40 mg by mouth 2 (two) times daily.   predniSONE 5 MG tablet Commonly known as:  DELTASONE Take 5 mg by mouth daily with breakfast.   SOOTHE XP Soln Apply 1 drop to eye 2 (two) times daily.   traMADol 50 MG tablet Commonly known as:  ULTRAM Take 1 tablet  (50 mg total) by mouth every 6 (six) hours as needed (mild pain).   traMADol-acetaminophen 37.5-325 MG tablet Commonly known as:  ULTRACET Take 1 tablet by mouth 2 (two) times daily as needed for moderate pain (takes 1 dose every morning and another dose only if needed for pain).   traZODone 50 MG tablet Commonly known as:  DESYREL Take 50 mg by mouth at bedtime.      Follow-up Information    [name], MD. Schedule an appointment as soon as possible for a visit in 3 week(s).   Specialty:  General Surgery Contact information: [address] [place] [address] [phone]           [name], MD, [facility] Office: [phone]   Signed: [name] 06/06/2017, 4:31 PM

## 2017-06-06 NOTE — Progress Notes (Signed)
Assessment unchanged. Pt and wife verbalized understanding of dc instructions through teach back regarding medication, activity to resume, follow up care, and when to call the doctor. Script x 1 given as provided by MD. No complaints voiced. Excited to go home. Discharged via foot per request to front entrance accompanied by NT and wife.

## 2017-06-06 NOTE — Anesthesia Procedure Notes (Signed)
Procedure Name: Intubation Date/Time: 06/06/2017 7:41 AM Performed by: Dione Booze Pre-anesthesia Checklist: Emergency Drugs available, Suction available, Patient being monitored and Patient identified Patient Re-evaluated:Patient Re-evaluated prior to induction Oxygen Delivery Method: Circle system utilized Preoxygenation: Pre-oxygenation with 100% oxygen Induction Type: IV induction Laryngoscope Size: Mac and 4 Grade View: Grade I Tube type: Oral Tube size: 7.5 mm Number of attempts: 1 Airway Equipment and Method: Stylet Placement Confirmation: ETT inserted through vocal cords under direct vision,  positive ETCO2 and breath sounds checked- equal and bilateral Secured at: 21 cm Tube secured with: Tape Dental Injury: Teeth and Oropharynx as per pre-operative assessment

## 2017-06-06 NOTE — Transfer of Care (Signed)
Immediate Anesthesia Transfer of Care Note  Patient: [name]  Procedure(s) Performed: LAPAROSCOPIC CHOLECYSTECTOMY WITH INTRAOPERATIVE CHOLANGIOGRAM (N/A ) UMBILICAL HERNIA REPAIR (N/A )  Patient Location: PACU  Anesthesia Type:General  Level of Consciousness: awake, alert  and patient cooperative  Airway & Oxygen Therapy: Patient Spontanous Breathing and Patient connected to face mask oxygen  Post-op Assessment: Report given to RN and Post -op Vital signs reviewed and stable  Post vital signs: Reviewed and stable  Last Vitals:  Vitals:   06/06/17 0529  BP: 137/61  Pulse: (!) 58  Resp: 18  SpO2: 98%    Last Pain:  Vitals:   06/06/17 0529  TempSrc: Oral      Patients Stated Pain Goal: 3 (40/10/27 2536)  Complications: No apparent anesthesia complications

## 2017-06-06 NOTE — Interval H&P Note (Signed)
History and Physical Interval Note:  06/06/2017 7:25 AM  [name]  has presented today for surgery, with the diagnosis of chronic cholecystitis, cholelithiasis, umbilical hernia  The various methods of treatment have been discussed with the patient and family. After consideration of risks, benefits and other options for treatment, the patient has consented to    Procedure(s): LAPAROSCOPIC CHOLECYSTECTOMY WITH INTRAOPERATIVE CHOLANGIOGRAM (N/A) LAPAROSCOPIC UMBILICAL HERNIA (N/A) as a surgical intervention .    The patient's history has been reviewed, patient examined, no change in status, stable for surgery.  I have reviewed the patient's chart and labs.  Questions were answered to the patient's satisfaction.    [name], [facility] Office: [place]

## 2017-06-06 NOTE — Op Note (Signed)
Procedure Note  Pre-operative Diagnosis:  1. Chronic cholecystitis, cholelithiasis  2. Umbilical hernia  Post-operative Diagnosis:  same  Surgeon:  [name], MD, FACS  Assistant:  none   Procedure:  1. Laparoscopic cholecystectomy with intra-operative cholangiography  2. Primary repair of umbilical hernia  Anesthesia:  General  Estimated Blood Loss:  minimal  Drains: none         Specimen: Gallbladder to pathology  Indications:  The patient is a 71 year old male who presents for evaluation of gall stones.  Patient is referred by his primary care physician, Dr. [name], and his gastroenterologist, Dr. Scarlette Shorts and Nicoletta Ba, for evaluation and management of this chronic cholecystitis and cholelithiasis. Patient has had known cholelithiasis for approximately 4 years. He has had intermittent epigastric and right upper quadrant abdominal pain. He has a long-standing history of peptic ulcer disease and gastroesophageal reflux. He is currently taking proton pump inhibitors. Patient has had a few episodes of more severe abdominal pain associated with nausea and vomiting and diarrhea. He was evaluated by gastroenterology including a CT scan of the abdomen and pelvis on May 10, 2017. This showed calcified gallstones. Previous abdominal ultrasound showed multiple gallstones and gallbladder sludge. Patient has had no history of jaundice or acholic stools. He denies fevers or chills. There is no family history of gallbladder disease. Previous abdominal surgery includes appendectomy and right inguinal hernia repair many years ago. Patient has no other history of hepatobiliary or pancreatic disease.  Procedure Details:  The patient was seen in the pre-op holding area. The risks, benefits, complications, treatment options, and expected outcomes were previously discussed with the patient. The patient agreed with the proposed plan and has signed the informed consent form.  The  patient was brought to the Operating Room, identified as [name] and the procedure verified as laparoscopic cholecystectomy with intraoperative cholangiography. A "time out" was completed and the above information confirmed.  The patient was placed in the supine position. Following induction of general anesthesia, the abdomen was prepped and draped in the usual aseptic fashion.  An incision was made in the skin below the umbilicus. The umbilicus was elevated off the fascia and the hernia defect defined.  Pre-peritoneal fat was reduced and the peritoneal cavity was entered and a Hasson canula was introduced under direct vision.  The Hasson canula was secured with a 0-Vicryl pursestring suture. Pneumoperitoneum was established with carbon dioxide. Additional trocars were introduced under direct vision along the right costal margin in the midline, mid-clavicular line, and anterior axillary line.   The gallbladder was identified and the fundus grasped and retracted cephalad. Adhesions were taken down bluntly and the electrocautery was utilized as needed, taking care not to injure any adjacent structures. The infundibulum was grasped and retracted laterally, exposing the peritoneum overlying the triangle of Calot. The peritoneum was incised and structures exposed with blunt dissection. The cystic duct was clearly identified, bluntly dissected circumferentially, and clipped at the neck of the gallbladder.  An incision was made in the cystic duct and the cholangiogram catheter introduced. The catheter was secured using an ligaclip.  Real-time cholangiography was performed using C-arm fluoroscopy.  There was rapid filling of a normal caliber common bile duct.  There was reflux of contrast into the left and right hepatic ductal systems.  There was free flow distally into the duodenum without filling defect or obstruction.  The catheter was removed from the peritoneal cavity.  The cystic duct was then  ligated  with surgical clips and divided. The cystic artery was identified, dissected circumferentially, ligated with ligaclips, and divided.  The gallbladder was dissected away from the liver bed using the electrocautery for hemostasis. The gallbladder was completely removed from the liver and placed into an endocatch bag. The gallbladder was removed in the endocatch bag through the umbilical port site and submitted to pathology for review.  Pneumoperitoneum was released after viewing removal of the trocars with good hemostasis noted. The umbilical fascia was then closed with interrupted #1 Novofil sutures.  Subcutaneous tissues were closed with interrupted 3-0 Vicryl sutures.  Local anesthetic was infiltrated at all port sites. The skin incisions were closed with 4-0 Monocril subcuticular sutures and steri-strips and dressings were applied.  Instrument, sponge, and needle counts were correct at the conclusion of the case.  The patient was awakened from anesthesia and brought to the recovery room in stable condition.  The patient tolerated the procedure well.   [name], MD, [facility] Office: [phone]

## 2017-06-06 NOTE — Anesthesia Preprocedure Evaluation (Signed)
Anesthesia Evaluation  Patient identified by MRN, date of birth, ID band Patient awake    Reviewed: Allergy & Precautions, NPO status , Patient's Chart, lab work & pertinent test results  Airway Mallampati: II  TM Distance: >3 FB Neck ROM: Full    Dental no notable dental hx.    Pulmonary neg pulmonary ROS, former smoker,    Pulmonary exam normal breath sounds clear to auscultation       Cardiovascular hypertension, Normal cardiovascular exam Rhythm:Regular Rate:Normal     Neuro/Psych negative neurological ROS  negative psych ROS   GI/Hepatic Neg liver ROS, GERD  ,  Endo/Other  negative endocrine ROS  Renal/GU negative Renal ROS  negative genitourinary   Musculoskeletal negative musculoskeletal ROS (+)   Abdominal   Peds negative pediatric ROS (+)  Hematology negative hematology ROS (+)   Anesthesia Other Findings   Reproductive/Obstetrics negative OB ROS                             Anesthesia Physical Anesthesia Plan  ASA: II  Anesthesia Plan: General   Post-op Pain Management:    Induction: Intravenous  PONV Risk Score and Plan: 1 and Ondansetron and Dexamethasone  Airway Management Planned: Oral ETT  Additional Equipment:   Intra-op Plan:   Post-operative Plan: Extubation in OR  Informed Consent: I have reviewed the patients History and Physical, chart, labs and discussed the procedure including the risks, benefits and alternatives for the proposed anesthesia with the patient or authorized representative who has indicated his/her understanding and acceptance.   Dental advisory given  Plan Discussed with: CRNA and Surgeon  Anesthesia Plan Comments:         Anesthesia Quick Evaluation

## 2017-06-06 NOTE — Anesthesia Postprocedure Evaluation (Signed)
Anesthesia Post Note  Patient: [name]  Procedure(s) Performed: LAPAROSCOPIC CHOLECYSTECTOMY WITH INTRAOPERATIVE CHOLANGIOGRAM (N/A ) UMBILICAL HERNIA REPAIR (N/A )     Patient location during evaluation: PACU Anesthesia Type: General Level of consciousness: awake and alert Pain management: pain level controlled Vital Signs Assessment: post-procedure vital signs reviewed and stable Respiratory status: spontaneous breathing, nonlabored ventilation, respiratory function stable and patient connected to nasal cannula oxygen Cardiovascular status: blood pressure returned to baseline and stable Postop Assessment: no apparent nausea or vomiting Anesthetic complications: no    Last Vitals:  Vitals:   06/06/17 0930 06/06/17 0949  BP: 136/78 136/72  Pulse: 79 76  Resp: 11 14  Temp: 36.4 C 36.7 C  SpO2: 97% 100%    Last Pain:  Vitals:   06/06/17 0930  TempSrc:   PainSc: 5                  Neldon Shepard S

## 2017-06-27 ENCOUNTER — Encounter: Payer: Self-pay | Admitting: Internal Medicine

## 2017-06-27 DIAGNOSIS — M1A09X0 Idiopathic chronic gout, multiple sites, without tophus (tophi): Secondary | ICD-10-CM | POA: Diagnosis not present

## 2017-06-27 DIAGNOSIS — M0579 Rheumatoid arthritis with rheumatoid factor of multiple sites without organ or systems involvement: Secondary | ICD-10-CM | POA: Diagnosis not present

## 2017-06-27 DIAGNOSIS — M150 Primary generalized (osteo)arthritis: Secondary | ICD-10-CM | POA: Diagnosis not present

## 2017-07-11 ENCOUNTER — Encounter: Payer: Medicare Other | Admitting: Internal Medicine

## 2017-07-11 ENCOUNTER — Encounter: Payer: Self-pay | Admitting: Internal Medicine

## 2017-07-11 ENCOUNTER — Ambulatory Visit (AMBULATORY_SURGERY_CENTER): Payer: Medicare Other | Admitting: Internal Medicine

## 2017-07-11 VITALS — BP 126/76 | HR 62 | Temp 98.9°F | Resp 10 | Ht 65.75 in | Wt 148.0 lb

## 2017-07-11 DIAGNOSIS — D120 Benign neoplasm of cecum: Secondary | ICD-10-CM | POA: Diagnosis not present

## 2017-07-11 DIAGNOSIS — D122 Benign neoplasm of ascending colon: Secondary | ICD-10-CM | POA: Diagnosis not present

## 2017-07-11 DIAGNOSIS — Z86010 Personal history of colonic polyps: Secondary | ICD-10-CM | POA: Diagnosis present

## 2017-07-11 DIAGNOSIS — D125 Benign neoplasm of sigmoid colon: Secondary | ICD-10-CM | POA: Diagnosis not present

## 2017-07-11 DIAGNOSIS — Z1211 Encounter for screening for malignant neoplasm of colon: Secondary | ICD-10-CM | POA: Diagnosis not present

## 2017-07-11 DIAGNOSIS — D123 Benign neoplasm of transverse colon: Secondary | ICD-10-CM

## 2017-07-11 DIAGNOSIS — K635 Polyp of colon: Secondary | ICD-10-CM | POA: Diagnosis not present

## 2017-07-11 MED ORDER — SODIUM CHLORIDE 0.9 % IV SOLN: 500.0000 mL | INTRAVENOUS | Status: DC

## 2017-07-11 NOTE — Patient Instructions (Signed)
YOU HAD AN ENDOSCOPIC PROCEDURE TODAY AT [facility]:   Refer to the procedure report that was given to you for any specific questions about what was found during the examination.  If the procedure report does not answer your questions, please call your gastroenterologist to clarify.  If you requested that your care partner not be given the details of your procedure findings, then the procedure report has been included in a sealed envelope for you to review at your convenience later.  YOU SHOULD EXPECT: Some feelings of bloating in the abdomen. Passage of more gas than usual.  Walking can help get rid of the air that was put into your GI tract during the procedure and reduce the bloating. If you had a lower endoscopy (such as a colonoscopy or flexible sigmoidoscopy) you may notice spotting of blood in your stool or on the toilet paper. If you underwent a bowel prep for your procedure, you may not have a normal bowel movement for a few days.  Please Note:  You might notice some irritation and congestion in your nose or some drainage.  This is from the oxygen used during your procedure.  There is no need for concern and it should clear up in a day or so.  SYMPTOMS TO REPORT IMMEDIATELY:   Following lower endoscopy (colonoscopy or flexible sigmoidoscopy):  Excessive amounts of blood in the stool  Significant tenderness or worsening of abdominal pains  Swelling of the abdomen that is new, acute  Fever of 100F or higher   For urgent or emergent issues, a gastroenterologist can be reached at any hour by calling [phone].   DIET:  We do recommend a small meal at first, but then you may proceed to your regular diet.  Drink plenty of fluids but you should avoid alcoholic beverages for 24 hours.  ACTIVITY:  You should plan to take it easy for the rest of today and you should NOT DRIVE or use heavy machinery until tomorrow (because of the sedation medicines used during the test).     FOLLOW UP: Our staff will call the number listed on your records the next business day following your procedure to check on you and address any questions or concerns that you may have regarding the information given to you following your procedure. If we do not reach you, we will leave a message.  However, if you are feeling well and you are not experiencing any problems, there is no need to return our call.  We will assume that you have returned to your regular daily activities without incident.  If any biopsies were taken you will be contacted by phone or by letter within the next 1-3 weeks.  Please call us at [phone] if you have not heard about the biopsies in 3 weeks.    SIGNATURES/CONFIDENTIALITY: You and/or your care partner have signed paperwork which will be entered into your electronic medical record.  These signatures attest to the fact that that the information above on your After Visit Summary has been reviewed and is understood.  Full responsibility of the confidentiality of this discharge information lies with you and/or your care-partner.  Drink warm fluids and walk around at thome. Don't eat anything gassy today.

## 2017-07-11 NOTE — Progress Notes (Signed)
Called to room to assist during endoscopic procedure.  Patient ID and intended procedure confirmed with present staff. Received instructions for my participation in the procedure from the performing physician.  

## 2017-07-11 NOTE — Progress Notes (Signed)
A/ox3 pleased with MAC, report to Suzanne RN 

## 2017-07-11 NOTE — Op Note (Signed)
[place] Patient Name: [name] Procedure Date: 07/11/2017 1:27 PM MRN: [id number] Endoscopist: Docia Chuck. Henrene Pastor , MD Age: 71 Referring MD:  Date of Birth: 12-03-1945 Gender: Male Account #: [id number] Procedure:                Colonoscopy, with cold snare polypectomy x 4 Indications:              High risk colon cancer surveillance: Personal                            history of adenoma (10 mm or greater in size), High                            risk colon cancer surveillance: Personal history of                            multiple (3 or more) adenomas, High risk colon                            cancer surveillance: Personal history of sessile                            serrated colon polyp (less than 10 mm in size) with                            no dysplasia. Previous examinations 2006, 2012, 2015 Medicines:                Monitored Anesthesia Care Procedure:                Pre-Anesthesia Assessment:                           - Prior to the procedure, a History and Physical                            was performed, and patient medications and                            allergies were reviewed. The patient's tolerance of                            previous anesthesia was also reviewed. The risks                            and benefits of the procedure and the sedation                            options and risks were discussed with the patient.                            All questions were answered, and informed consent                            was obtained. Prior Anticoagulants: The patient has  taken no previous anticoagulant or antiplatelet                            agents. ASA Grade Assessment: II - A patient with                            mild systemic disease. After reviewing the risks                            and benefits, the patient was deemed in                            satisfactory condition to undergo the procedure.                    After obtaining informed consent, the colonoscope                            was passed under direct vision. Throughout the                            procedure, the patient's blood pressure, pulse, and                            oxygen saturations were monitored continuously. The                            Colonoscope was introduced through the anus and                            advanced to the the cecum, identified by                            appendiceal orifice and ileocecal valve. The                            ileocecal valve, appendiceal orifice, and rectum                            were photographed. The quality of the bowel                            preparation was excellent. The colonoscopy was                            performed without difficulty. The patient tolerated                            the procedure well. The bowel preparation used was                            SUPREP. Scope In: 1:33:59 PM Scope Out: 1:57:39 PM Scope Withdrawal Time: 0 hours 17 minutes 21 seconds  Total Procedure Duration: 0 hours 23 minutes 40 seconds  Findings:  Four polyps were found in the sigmoid colon,                            transverse colon, ascending colon and cecum. The                            polyps were 1 to 4 mm in size. These polyps were                            removed with a cold snare. Resection and retrieval                            were complete.                           Multiple diverticula were found in the left colon.                           Internal hemorrhoids were found during retroflexion.                           The exam was otherwise without abnormality on                            direct and retroflexion views. Complications:            No immediate complications. Estimated blood loss:                            None. Estimated Blood Loss:     Estimated blood loss: none. Impression:               - Four 1 to 4 mm  polyps in the sigmoid colon, in                            the transverse colon, in the ascending colon and in                            the cecum, removed with a cold snare. Resected and                            retrieved.                           - Diverticulosis in the left colon.                           - Internal hemorrhoids.                           - The examination was otherwise normal on direct                            and retroflexion views. Recommendation:           - Repeat colonoscopy in 3 -  5 years for                            surveillance.                           - Patient has a contact number available for                            emergencies. The signs and symptoms of potential                            delayed complications were discussed with the                            patient. Return to normal activities tomorrow.                            Written discharge instructions were provided to the                            patient.                           - Resume previous diet.                           - Continue present medications.                           - Await pathology results. Docia Chuck. Henrene Pastor, MD 07/11/2017 2:03:35 PM This report has been signed electronically.

## 2017-07-12 ENCOUNTER — Telehealth: Payer: Self-pay | Admitting: *Deleted

## 2017-07-12 NOTE — Telephone Encounter (Signed)
  Follow up Call-  Call back number 07/11/2017  Post procedure Call Back phone  # [phone]  Permission to leave phone message No  Some recent data might be hidden     Patient questions:  Do you have a fever, pain , or abdominal swelling? No. Pain Score  0 *  Have you tolerated food without any problems? Yes.    Have you been able to return to your normal activities? Yes.    Do you have any questions about your discharge instructions: Diet   No. Medications  No. Follow up visit  No.  Do you have questions or concerns about your Care? No.  Actions: * If pain score is 4 or above: No action needed, pain <4.

## 2017-07-17 ENCOUNTER — Encounter: Payer: Self-pay | Admitting: Internal Medicine

## 2017-09-12 DIAGNOSIS — M150 Primary generalized (osteo)arthritis: Secondary | ICD-10-CM | POA: Diagnosis not present

## 2017-09-12 DIAGNOSIS — M0579 Rheumatoid arthritis with rheumatoid factor of multiple sites without organ or systems involvement: Secondary | ICD-10-CM | POA: Diagnosis not present

## 2017-09-12 DIAGNOSIS — M1A09X0 Idiopathic chronic gout, multiple sites, without tophus (tophi): Secondary | ICD-10-CM | POA: Diagnosis not present

## 2017-09-26 DIAGNOSIS — Z5181 Encounter for therapeutic drug level monitoring: Secondary | ICD-10-CM | POA: Diagnosis not present

## 2017-09-26 DIAGNOSIS — M0589 Other rheumatoid arthritis with rheumatoid factor of multiple sites: Secondary | ICD-10-CM | POA: Diagnosis not present

## 2017-09-26 DIAGNOSIS — Z79899 Other long term (current) drug therapy: Secondary | ICD-10-CM | POA: Diagnosis not present

## 2017-09-26 DIAGNOSIS — E291 Testicular hypofunction: Secondary | ICD-10-CM | POA: Diagnosis not present

## 2017-10-02 DIAGNOSIS — R739 Hyperglycemia, unspecified: Secondary | ICD-10-CM | POA: Diagnosis not present

## 2017-10-02 DIAGNOSIS — Z125 Encounter for screening for malignant neoplasm of prostate: Secondary | ICD-10-CM | POA: Diagnosis not present

## 2017-10-02 DIAGNOSIS — M1000 Idiopathic gout, unspecified site: Secondary | ICD-10-CM | POA: Diagnosis not present

## 2017-10-02 DIAGNOSIS — E291 Testicular hypofunction: Secondary | ICD-10-CM | POA: Diagnosis not present

## 2017-10-02 DIAGNOSIS — I10 Essential (primary) hypertension: Secondary | ICD-10-CM | POA: Diagnosis not present

## 2017-10-31 ENCOUNTER — Emergency Department (HOSPITAL_COMMUNITY): Payer: Medicare Other

## 2017-10-31 ENCOUNTER — Emergency Department (HOSPITAL_COMMUNITY)
Admission: EM | Admit: 2017-10-31 | Discharge: 2017-10-31 | Disposition: A | Discharge status: Home / Self Care | Payer: Medicare Other | Attending: Emergency Medicine | Admitting: Emergency Medicine

## 2017-10-31 ENCOUNTER — Encounter (HOSPITAL_COMMUNITY): Payer: Self-pay

## 2017-10-31 DIAGNOSIS — I714 Abdominal aortic aneurysm, without rupture, unspecified: Secondary | ICD-10-CM

## 2017-10-31 DIAGNOSIS — I10 Essential (primary) hypertension: Secondary | ICD-10-CM | POA: Insufficient documentation

## 2017-10-31 DIAGNOSIS — J101 Influenza due to other identified influenza virus with other respiratory manifestations: Secondary | ICD-10-CM | POA: Diagnosis not present

## 2017-10-31 DIAGNOSIS — R509 Fever, unspecified: Secondary | ICD-10-CM | POA: Diagnosis not present

## 2017-10-31 DIAGNOSIS — Z79899 Other long term (current) drug therapy: Secondary | ICD-10-CM | POA: Insufficient documentation

## 2017-10-31 DIAGNOSIS — R05 Cough: Secondary | ICD-10-CM | POA: Diagnosis not present

## 2017-10-31 DIAGNOSIS — Z87891 Personal history of nicotine dependence: Secondary | ICD-10-CM | POA: Insufficient documentation

## 2017-10-31 DIAGNOSIS — R531 Weakness: Secondary | ICD-10-CM | POA: Diagnosis not present

## 2017-10-31 DIAGNOSIS — I6789 Other cerebrovascular disease: Secondary | ICD-10-CM | POA: Diagnosis not present

## 2017-10-31 DIAGNOSIS — R1110 Vomiting, unspecified: Secondary | ICD-10-CM

## 2017-10-31 DIAGNOSIS — K5730 Diverticulosis of large intestine without perforation or abscess without bleeding: Secondary | ICD-10-CM | POA: Diagnosis not present

## 2017-10-31 DIAGNOSIS — R112 Nausea with vomiting, unspecified: Secondary | ICD-10-CM | POA: Diagnosis not present

## 2017-10-31 LAB — COMPREHENSIVE METABOLIC PANEL
ALBUMIN: 3.6 g/dL (ref 3.5–5.0)
ALK PHOS: 71 U/L (ref 38–126)
ALT: 30 U/L (ref 17–63)
AST: 45 U/L — AB (ref 15–41)
Anion gap: 13 (ref 5–15)
BUN: 44 mg/dL — AB (ref 6–20)
CALCIUM: 8.9 mg/dL (ref 8.9–10.3)
CO2: 25 mmol/L (ref 22–32)
CREATININE: 1.79 mg/dL — AB (ref 0.61–1.24)
Chloride: 99 mmol/L — ABNORMAL LOW (ref 101–111)
GFR calc Af Amer: 42 mL/min — ABNORMAL LOW (ref >60)
GFR calc non Af Amer: 36 mL/min — ABNORMAL LOW (ref >60)
GLUCOSE: 115 mg/dL — AB (ref 65–99)
Potassium: 3.7 mmol/L (ref 3.5–5.1)
SODIUM: 137 mmol/L (ref 135–145)
Total Bilirubin: 1 mg/dL (ref 0.3–1.2)
Total Protein: 7.5 g/dL (ref 6.5–8.1)

## 2017-10-31 LAB — CBC WITH DIFFERENTIAL/PLATELET
BASOS ABS: 0 10*3/uL (ref 0.0–0.1)
BASOS PCT: 0 %
EOS ABS: 0 10*3/uL (ref 0.0–0.7)
Eosinophils Relative: 0 %
HCT: 48.3 % (ref 39.0–52.0)
HEMOGLOBIN: 15.8 g/dL (ref 13.0–17.0)
LYMPHS ABS: 1.7 10*3/uL (ref 0.7–4.0)
Lymphocytes Relative: 20 %
MCH: 31 pg (ref 26.0–34.0)
MCHC: 32.7 g/dL (ref 30.0–36.0)
MCV: 94.7 fL (ref 78.0–100.0)
Monocytes Absolute: 1 10*3/uL (ref 0.1–1.0)
Monocytes Relative: 13 %
NEUTROS PCT: 67 %
Neutro Abs: 5.5 10*3/uL (ref 1.7–7.7)
Platelets: 254 10*3/uL (ref 150–400)
RBC: 5.1 MIL/uL (ref 4.22–5.81)
RDW: 15.7 % — ABNORMAL HIGH (ref 11.5–15.5)
WBC: 8.2 10*3/uL (ref 4.0–10.5)

## 2017-10-31 LAB — INFLUENZA PANEL BY PCR (TYPE A & B)
INFLAPCR: POSITIVE — AB
Influenza B By PCR: NEGATIVE

## 2017-10-31 LAB — URINALYSIS, ROUTINE W REFLEX MICROSCOPIC
Bilirubin Urine: NEGATIVE
Glucose, UA: NEGATIVE mg/dL
KETONES UR: 5 mg/dL — AB
Leukocytes, UA: NEGATIVE
Nitrite: NEGATIVE
PROTEIN: NEGATIVE mg/dL
Specific Gravity, Urine: 1.019 (ref 1.005–1.030)
pH: 5 (ref 5.0–8.0)

## 2017-10-31 LAB — LIPASE, BLOOD: LIPASE: 55 U/L — AB (ref 11–51)

## 2017-10-31 MED ORDER — ONDANSETRON HCL 4 MG/2ML IJ SOLN
4.0000 mg | Freq: Once | INTRAMUSCULAR | Status: AC
Start: 2017-10-31 — End: 2017-10-31
  Administered 2017-10-31: 4 mg via INTRAVENOUS
  Filled 2017-10-31: qty 2

## 2017-10-31 MED ORDER — ONDANSETRON 4 MG PO TBDP: ORAL_TABLET | ORAL | 0 refills | Status: DC

## 2017-10-31 MED ORDER — ACETAMINOPHEN 500 MG PO TABS
ORAL_TABLET | ORAL | Status: AC
  Filled 2017-10-31: qty 2

## 2017-10-31 MED ORDER — SODIUM CHLORIDE 0.9 % IV BOLUS (SEPSIS)
500.0000 mL | Freq: Once | INTRAVENOUS | Status: AC
  Administered 2017-10-31: 500 mL via INTRAVENOUS

## 2017-10-31 MED ORDER — OSELTAMIVIR PHOSPHATE 30 MG PO CAPS: 30.0000 mg | ORAL_CAPSULE | Freq: Two times a day (BID) | ORAL | 0 refills | Status: DC

## 2017-10-31 MED ORDER — ACETAMINOPHEN 500 MG PO TABS
1000.0000 mg | ORAL_TABLET | Freq: Once | ORAL | Status: AC
  Administered 2017-10-31: 1000 mg via ORAL

## 2017-10-31 NOTE — ED Triage Notes (Signed)
Ems reports pt has had n/v since yesterday.  Today c/o generalized body aches and chills.  Reports cough and congestion x several days.  Pt says hurts all over.  Pt also c/o neck pain.  02 sat 96% on room air.  CBG 131.

## 2017-10-31 NOTE — ED Triage Notes (Signed)
Ems gave zofran 4mg  IV pta.

## 2017-10-31 NOTE — ED Provider Notes (Signed)
[facility] EMERGENCY DEPARTMENT Provider Note   CSN: [id number] Arrival date & time: 10/31/17  1117     History   Chief Complaint Chief Complaint  Patient presents with  . Emesis  . Fever    HPI [name] is a 72 y.o. male.  HPI Patient presents with acute onset subjective fever, diffuse myalgias, cough with yellow sputum production and vomiting.  Patient has had multiple episodes of vomiting for the past day.  Also complains of frontal headache, congestion and sore throat.  Daughter was sick with similar symptoms prior to onset of patient's symptoms.  Denies diarrhea.  No blood in vomit. Past Medical History:  Diagnosis Date  . Anxiety   . Depression   . GERD (gastroesophageal reflux disease)   . Gout   . History of stomach ulcers 2000  . Hyperlipidemia   . Hypertension   . Rheumatoid arthritis ([place])    Rheumatoid     Patient Active Problem List   Diagnosis Date Noted  . Cholelithiasis with chronic cholecystitis 06/05/2017  . Umbilical hernia 97/98/9211  . Cholelithiasis 05/09/2017  . GERD 10/15/2010  . NAUSEA WITH VOMITING 10/15/2010  . COLONIC POLYPS 11/25/2008  . GASTRIC ULCER 11/25/2008  . DUODENAL ULCER 11/25/2008  . DIVERTICULOSIS, COLON 11/25/2008  . ARTHRITIS, RHEUMATOID 11/25/2008  . NAUSEA 11/25/2008  . ABDOMINAL PAIN-EPIGASTRIC 11/25/2008  . ABDOMINAL PAIN -GENERALIZED 11/25/2008    Past Surgical History:  Procedure Laterality Date  . APPENDECTOMY  1970  . back fusion  2000  . CHOLECYSTECTOMY     06/06/17 Dr. Harlow Asa 06/06/17  . CHOLECYSTECTOMY N/A 06/06/2017   Procedure: LAPAROSCOPIC CHOLECYSTECTOMY WITH INTRAOPERATIVE CHOLANGIOGRAM;  Surgeon: [name], MD;  Location: [facility];  Service: General;  Laterality: N/A;  . EYE SURGERY     Dr. [name]  lens implants  . HERNIA REPAIR     umbilical  . LUMBAR LAMINECTOMY  2000  . TIBIA FRACTURE SURGERY Left 1998   with titanium rods  . UMBILICAL HERNIA REPAIR N/A 06/06/2017   Procedure:  UMBILICAL HERNIA REPAIR;  Surgeon: [name], MD;  Location: [facility];  Service: General;  Laterality: N/A;       Home Medications    Prior to Admission medications   Medication Sig Start Date End Date Taking? Authorizing Provider  allopurinol (ZYLOPRIM) 300 MG tablet Take 300 mg by mouth daily.   Yes [provider]  Cyanocobalamin (B-12) 1000 MCG CAPS Take 1 capsule by mouth every evening.    Yes [provider]  escitalopram (LEXAPRO) 20 MG tablet Take 20 mg by mouth at bedtime.    Yes [provider]  folic acid (FOLVITE) 1 MG tablet Take 1 mg by mouth daily.   Yes [provider]  loratadine (CLARITIN) 10 MG tablet Take 10 mg by mouth daily as needed for allergies.   Yes [provider]  losartan (COZAAR) 50 MG tablet Take 50 mg by mouth at bedtime.    Yes [provider]  methotrexate (50 MG/ML) 1 G injection Inject 25 mg into the vein once a week. On Fridays   Yes [provider]  Multiple Vitamins-Minerals (CENTRUM SILVER 50+MEN) TABS Take 1 tablet by mouth daily.   Yes [provider]  Omega-3 Fatty Acids (FISH OIL) 1200 MG CAPS Take 6,000 mg by mouth daily. Takes 5 capsules every morning   Yes [provider]  pantoprazole (PROTONIX) 40 MG tablet Take 40 mg by mouth 2 (two) times daily.  Yes [provider]  predniSONE (DELTASONE) 5 MG tablet Take 5 mg by mouth daily with breakfast.   Yes [provider]  testosterone cypionate (DEPOTESTOSTERONE CYPIONATE) 200 MG/ML injection Inject 1 mL into the muscle. Every 3 weeks 09/20/17  Yes [provider]  traMADol-acetaminophen (ULTRACET) 37.5-325 MG per tablet Take 1 tablet by mouth 2 (two) times daily as needed for moderate pain (takes 1 dose every morning and another dose only if needed for pain).    Yes [provider]  ondansetron (ZOFRAN ODT) 4 MG disintegrating tablet 4mg  ODT q4 hours prn nausea/vomit 10/31/17    [name], MD  oseltamivir (TAMIFLU) 30 MG capsule Take 1 capsule (30 mg total) by mouth every 12 (twelve) hours. 10/31/17   [name], MD    Family History Family History  Problem Relation Age of Onset  . CVA Mother   . Colon cancer Neg Hx     Social History Social History   Tobacco Use  . Smoking status: Former Smoker    Packs/day: 2.00    Years: 20.00    Pack years: 40.00    Types: Cigarettes    Last attempt to quit: 08/08/1978    Years since quitting: 39.2  . Smokeless tobacco: Never Used  Substance Use Topics  . Alcohol use: No  . Drug use: No     Allergies   Patient has no known allergies.   Review of Systems Review of Systems  Constitutional: Positive for chills, fatigue and fever.  HENT: Positive for congestion, sinus pressure, sinus pain and sore throat. Negative for trouble swallowing and voice change.   Eyes: Negative for visual disturbance.  Respiratory: Positive for cough. Negative for chest tightness, shortness of breath and wheezing.   Cardiovascular: Negative for chest pain, palpitations and leg swelling.  Gastrointestinal: Positive for abdominal pain, nausea and vomiting. Negative for constipation and diarrhea.  Genitourinary: Negative for difficulty urinating, dysuria, flank pain, frequency and hematuria.  Musculoskeletal: Negative for arthralgias, back pain, joint swelling and myalgias.  Skin: Negative for rash and wound.  Neurological: Positive for headaches. Negative for dizziness, weakness, light-headedness and numbness.  All other systems reviewed and are negative.    Physical Exam Updated Vital Signs BP (!) 123/58 (BP Location: Right Arm)   Pulse 62   Temp 97.7 F (36.5 C) (Oral)   Resp 18   Ht 5\' 8"  (1.727 m)   Wt 72.6 kg (160 lb)   SpO2 100%   BMI 24.33 kg/m   Physical Exam  Constitutional: He is oriented to person, place, and time. He appears well-developed and well-nourished. No distress.  HENT:  Head:  Normocephalic and atraumatic.  Mouth/Throat: Oropharynx is clear and moist. No oropharyngeal exudate.  Eyes: EOM are normal. Pupils are equal, round, and reactive to light.  Neck: Normal range of motion. Neck supple.  No meningismus  Cardiovascular: Normal rate and regular rhythm. Exam reveals no gallop and no friction rub.  No murmur heard. Pulmonary/Chest: Effort normal and breath sounds normal. No stridor. No respiratory distress. He has no wheezes. He has no rales. He exhibits no tenderness.  Abdominal: Soft. Bowel sounds are normal. There is tenderness. There is no rebound and no guarding.  Patient with epigastric and right upper quadrant tenderness to palpation.  No rebound or guarding.  Musculoskeletal: Normal range of motion. He exhibits no edema or tenderness.  Question mild right CVA tenderness.  No lower extremity swelling, asymmetry or tenderness.  Distal pulses are 2+.  Neurological:  He is alert and oriented to person, place, and time.  Moves all extremities without focal deficit.  Sensation intact.  Skin: Skin is warm and dry. Capillary refill takes less than 2 seconds. No rash noted. No erythema.  Psychiatric: He has a normal mood and affect. His behavior is normal.  Nursing note and vitals reviewed.    ED Treatments / Results  Labs (all labs ordered are listed, but only abnormal results are displayed) Labs Reviewed  CBC WITH DIFFERENTIAL/PLATELET - Abnormal; Notable for the following components:      Result Value   RDW 15.7 (*)    All other components within normal limits  COMPREHENSIVE METABOLIC PANEL - Abnormal; Notable for the following components:   Chloride 99 (*)    Glucose, Bld 115 (*)    BUN 44 (*)    Creatinine, Ser 1.79 (*)    AST 45 (*)    GFR calc non Af Amer 36 (*)    GFR calc Af Amer 42 (*)    All other components within normal limits  URINALYSIS, ROUTINE W REFLEX MICROSCOPIC - Abnormal; Notable for the following components:   APPearance HAZY (*)      Hgb urine dipstick SMALL (*)    Ketones, ur 5 (*)    Bacteria, UA RARE (*)    Squamous Epithelial / LPF 0-5 (*)    All other components within normal limits  INFLUENZA PANEL BY PCR (TYPE A & B) - Abnormal; Notable for the following components:   Influenza A By PCR POSITIVE (*)    All other components within normal limits  LIPASE, BLOOD - Abnormal; Notable for the following components:   Lipase 55 (*)    All other components within normal limits    EKG  EKG Interpretation None       Radiology Ct Abdomen Pelvis Wo Contrast  Result Date: 10/31/2017 CLINICAL DATA:  Body aches, chills diffuse pain EXAM: CT ABDOMEN AND PELVIS WITHOUT CONTRAST TECHNIQUE: Multidetector CT imaging of the abdomen and pelvis was performed following the standard protocol without IV contrast. COMPARISON:  CT 05/10/2017, 08/08/2013 FINDINGS: Lower chest: Lung bases demonstrate no acute consolidation or effusion. Coronary vascular calcification. Normal heart size. Hepatobiliary: No focal liver abnormality is seen. Status post cholecystectomy. No biliary dilatation. Pancreas: Unremarkable. No pancreatic ductal dilatation or surrounding inflammatory changes. Spleen: Normal in size without focal abnormality. Adrenals/Urinary Tract: Adrenal glands are within normal limits. Nonspecific perinephric fat stranding. No hydronephrosis or ureteral stone. Hypodensity lower pole right kidney likely corresponding to a cyst on prior study. Bladder unremarkable Stomach/Bowel: Stomach within normal limits. No dilated small bowel. No colon wall thickening. Sigmoid colon diverticula without acute inflammation. Appendix not seen compatible with history of appendectomy Vascular/Lymphatic: Moderate aortic atherosclerosis. Focally ectatic infrarenal abdominal aorta, measuring 2.7 cm AP. No significantly enlarged lymph nodes Reproductive: Prostate calcifications Other: Negative for free air or free fluid. Musculoskeletal: Degenerative changes of  the lumbar spine. Interbody device at L4-L5 with laminectomy changes. IMPRESSION: 1. Negative for nephrolithiasis, hydronephrosis or ureteral stone 2. Sigmoid colon diverticular disease without acute inflammation 3. Infrarenal abdominal aorta measures up to 2.7 cm. Ectatic abdominal aorta at risk for aneurysm development. Recommend followup by ultrasound in 5 years. This recommendation follows ACR consensus guidelines: White Paper of the ACR Incidental Findings Committee II on Vascular Findings. J Am Coll Radiol 2013; 10:789-794. Electronically Signed   By: Donavan Foil M.D.   On: 10/31/2017 14:53   Dg Chest 2 View  Result Date: 10/31/2017  CLINICAL DATA:  Cough and congestion. EXAM: CHEST  2 VIEW COMPARISON:  01/05/2015. FINDINGS: Mediastinum and hilar structures are normal. Mild atelectatic changes right lung base. No pleural effusion or pneumothorax. IMPRESSION: Mild atelectatic changes right lung base. Electronically Signed   By: Marcello Moores  Register   On: 10/31/2017 12:00    Procedures Procedures (including critical care time)  Medications Ordered in ED Medications  acetaminophen (TYLENOL) 500 MG tablet (  Not Given 10/31/17 1345)  sodium chloride 0.9 % bolus 500 mL (0 mLs Intravenous Stopped 10/31/17 1330)  acetaminophen (TYLENOL) tablet 1,000 mg (1,000 mg Oral Given 10/31/17 1230)  ondansetron (ZOFRAN) injection 4 mg (4 mg Intravenous Given 10/31/17 1426)     Initial Impression / Assessment and Plan / ED Course  I have reviewed the triage vital signs and the nursing notes.  Pertinent labs & imaging results that were available during my care of the patient were reviewed by me and considered in my medical decision making (see chart for details).    No further vomiting in the emergency department.  Flu A is positive.  CT abdomen without acute findings.  Informed of need to follow-up for small AAA.  Patient does have mild elevation in creatinine.  This may related to dehydration.  Given IV fluids  in the emergency department.  Will also need to have this followed up by his primary doctor.  Return precautions given.   Final Clinical Impressions(s) / ED Diagnoses   Final diagnoses:  Influenza A  Non-intractable vomiting, presence of nausea not specified, unspecified vomiting type  Abdominal aortic aneurysm (AAA) without rupture [facility])    ED Discharge Orders        Ordered    oseltamivir (TAMIFLU) 30 MG capsule  Every 12 hours     10/31/17 1511    ondansetron (ZOFRAN ODT) 4 MG disintegrating tablet     10/31/17 1511       [name], MD 10/31/17 1515

## 2018-01-01 ENCOUNTER — Other Ambulatory Visit: Payer: Self-pay

## 2018-01-01 DIAGNOSIS — D045 Carcinoma in situ of skin of trunk: Secondary | ICD-10-CM | POA: Diagnosis not present

## 2018-02-08 DIAGNOSIS — D045 Carcinoma in situ of skin of trunk: Secondary | ICD-10-CM | POA: Diagnosis not present

## 2018-03-28 DIAGNOSIS — I10 Essential (primary) hypertension: Secondary | ICD-10-CM | POA: Diagnosis not present

## 2018-03-28 DIAGNOSIS — E291 Testicular hypofunction: Secondary | ICD-10-CM | POA: Diagnosis not present

## 2018-03-28 DIAGNOSIS — M1000 Idiopathic gout, unspecified site: Secondary | ICD-10-CM | POA: Diagnosis not present

## 2018-03-28 DIAGNOSIS — Z125 Encounter for screening for malignant neoplasm of prostate: Secondary | ICD-10-CM | POA: Diagnosis not present

## 2018-03-28 DIAGNOSIS — R739 Hyperglycemia, unspecified: Secondary | ICD-10-CM | POA: Diagnosis not present

## 2018-04-05 DIAGNOSIS — Z0000 Encounter for general adult medical examination without abnormal findings: Secondary | ICD-10-CM | POA: Diagnosis not present

## 2018-04-05 DIAGNOSIS — R7301 Impaired fasting glucose: Secondary | ICD-10-CM | POA: Diagnosis not present

## 2018-04-05 DIAGNOSIS — M1000 Idiopathic gout, unspecified site: Secondary | ICD-10-CM | POA: Diagnosis not present

## 2018-04-05 DIAGNOSIS — Z79899 Other long term (current) drug therapy: Secondary | ICD-10-CM | POA: Diagnosis not present

## 2018-04-05 DIAGNOSIS — R739 Hyperglycemia, unspecified: Secondary | ICD-10-CM | POA: Diagnosis not present

## 2018-04-05 DIAGNOSIS — I10 Essential (primary) hypertension: Secondary | ICD-10-CM | POA: Diagnosis not present

## 2018-04-10 DIAGNOSIS — Z961 Presence of intraocular lens: Secondary | ICD-10-CM | POA: Diagnosis not present

## 2018-04-10 DIAGNOSIS — H04123 Dry eye syndrome of bilateral lacrimal glands: Secondary | ICD-10-CM | POA: Diagnosis not present

## 2018-05-10 DIAGNOSIS — M0589 Other rheumatoid arthritis with rheumatoid factor of multiple sites: Secondary | ICD-10-CM | POA: Diagnosis not present

## 2018-05-10 DIAGNOSIS — M1A09X0 Idiopathic chronic gout, multiple sites, without tophus (tophi): Secondary | ICD-10-CM | POA: Diagnosis not present

## 2018-05-10 DIAGNOSIS — M25519 Pain in unspecified shoulder: Secondary | ICD-10-CM | POA: Diagnosis not present

## 2018-05-10 DIAGNOSIS — M79643 Pain in unspecified hand: Secondary | ICD-10-CM | POA: Diagnosis not present

## 2018-05-10 DIAGNOSIS — M25539 Pain in unspecified wrist: Secondary | ICD-10-CM | POA: Diagnosis not present

## 2018-05-21 DIAGNOSIS — D045 Carcinoma in situ of skin of trunk: Secondary | ICD-10-CM | POA: Diagnosis not present

## 2018-06-23 IMAGING — DX DG CHEST 2V
2 series · 3 of 3 positions shown · non-contrast
Comparison: 01/05/2015.

CLINICAL DATA: Cough and congestion.

EXAM:
CHEST  2 VIEW

[Series 2: chest lat · 0.14mm/px · 2 of 2 slices shown]
[im 1/2]
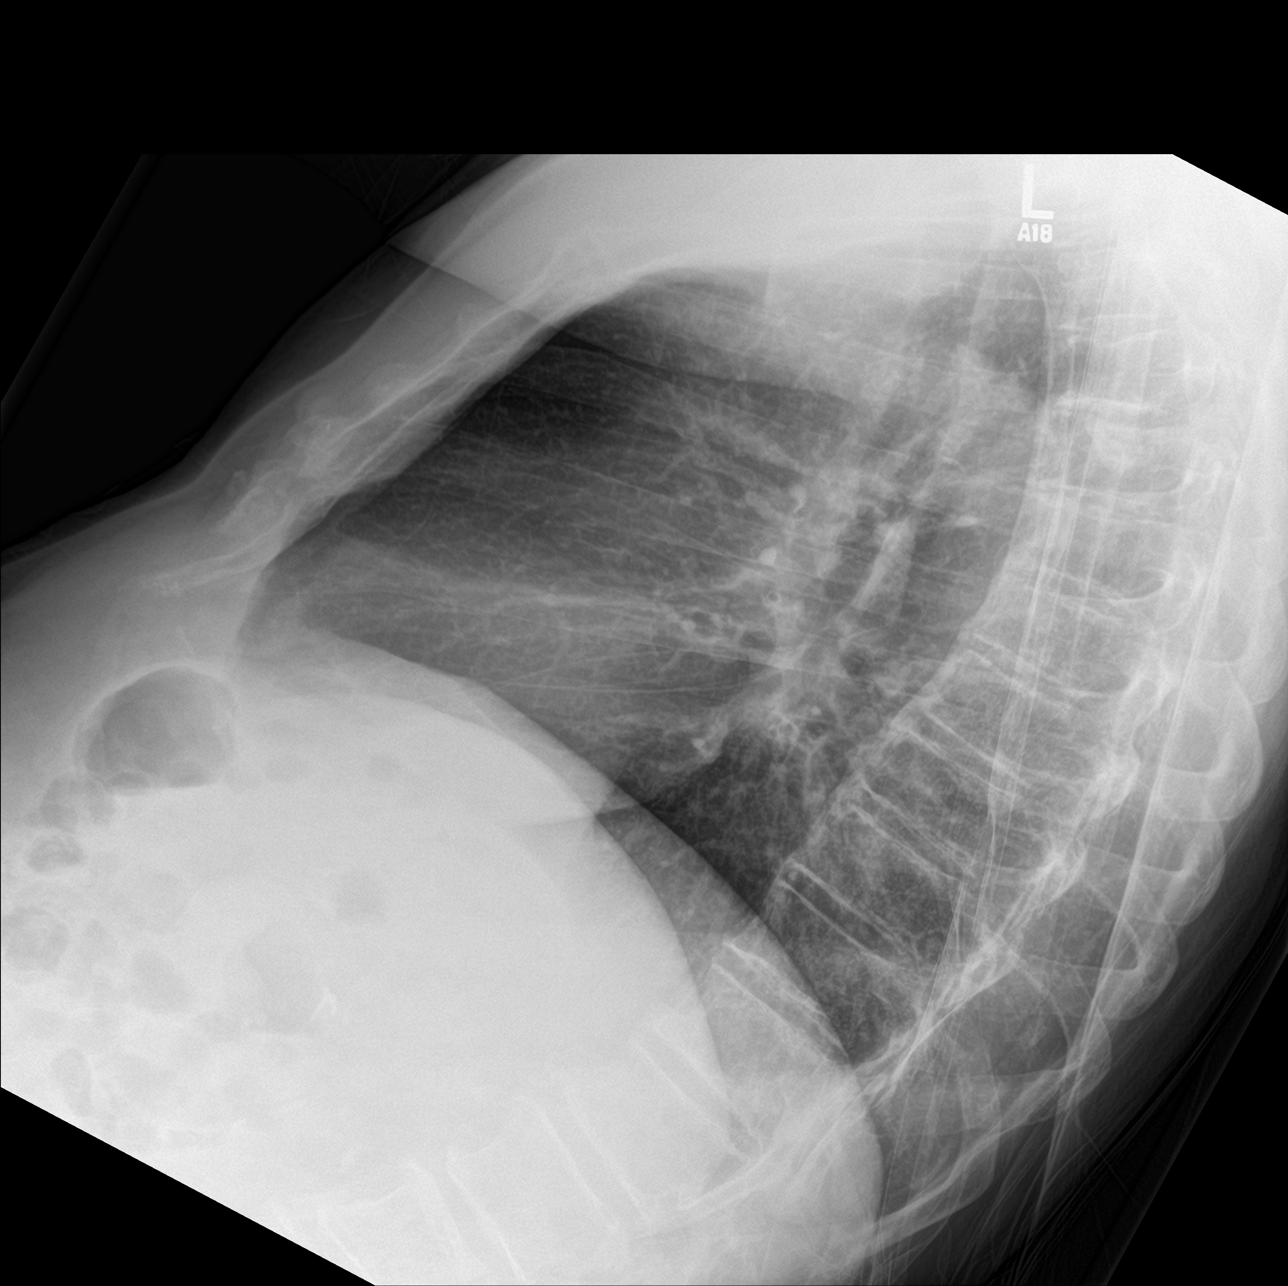
[im 2/2]
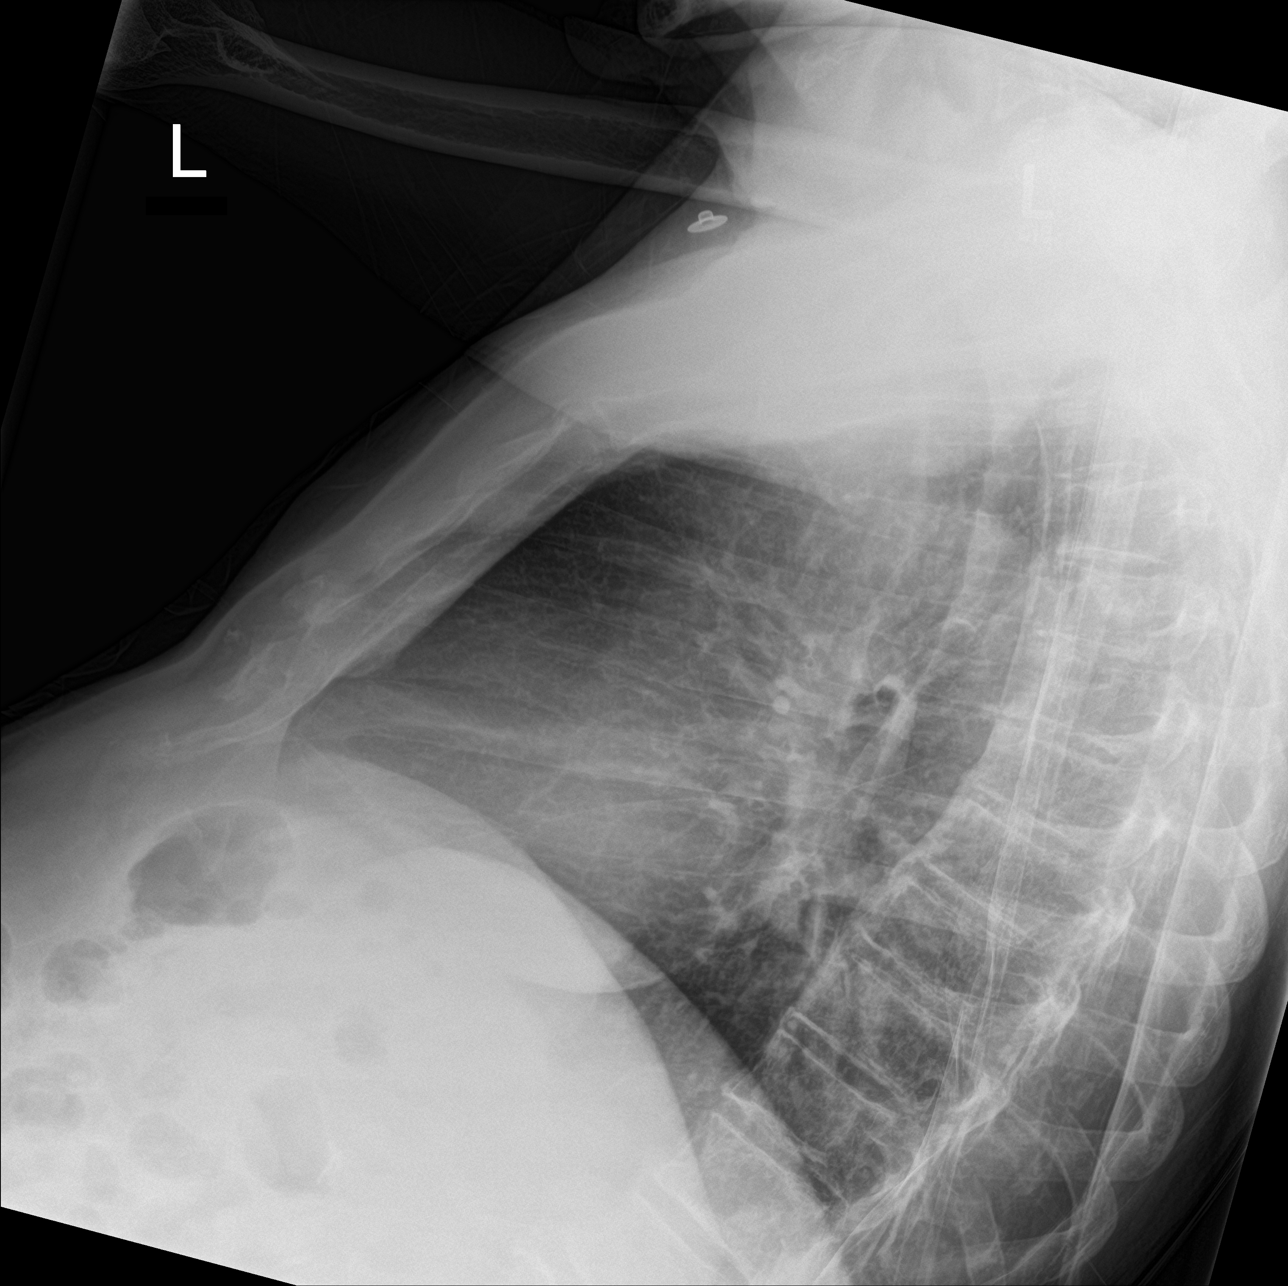

[chest ap]
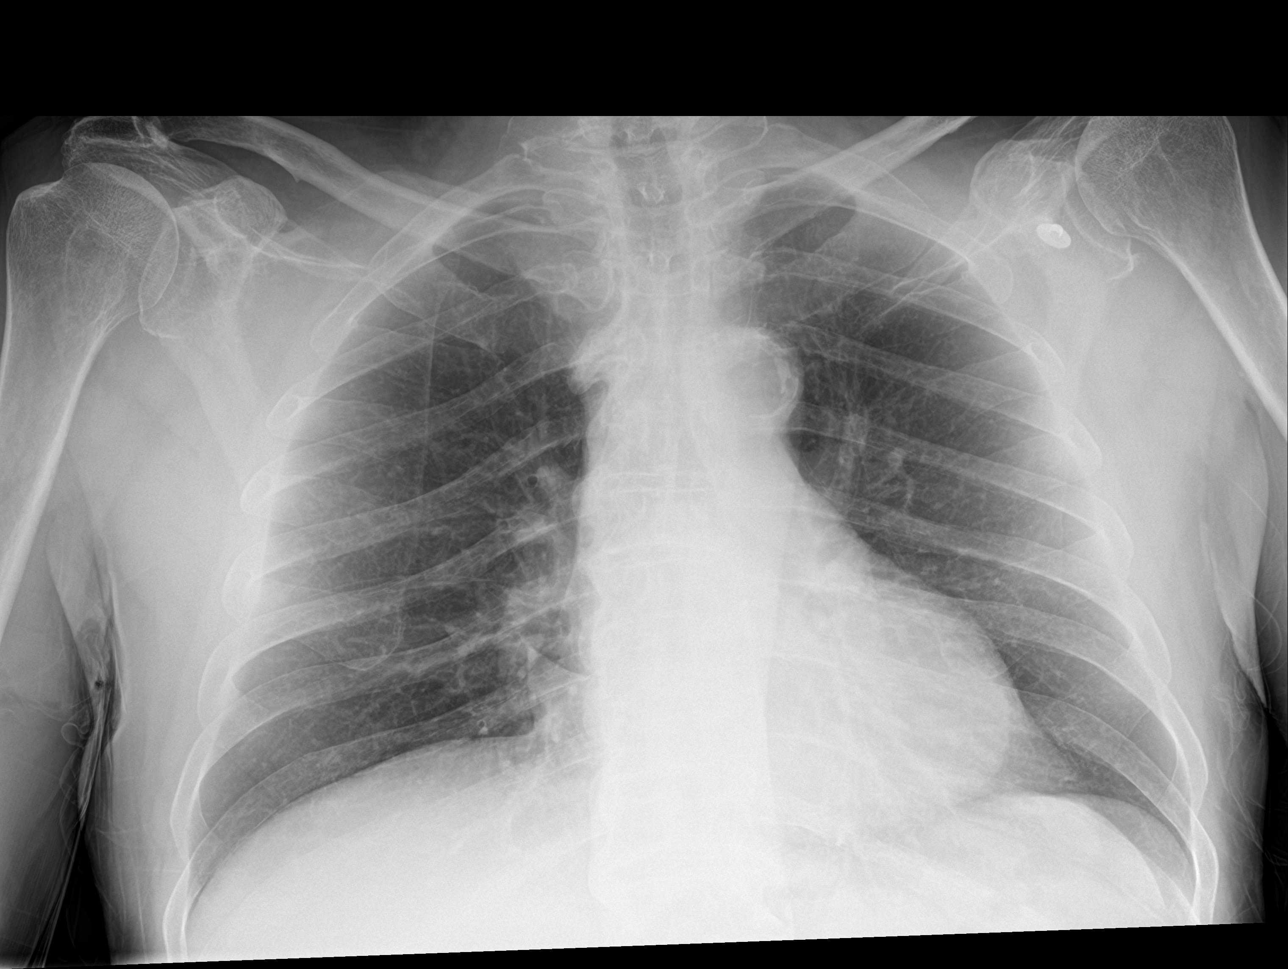

[3 of 3 positions shown; findings below may reference images not displayed]

FINDINGS: Mediastinum and hilar structures are normal. Mild atelectatic
changes right lung base. No pleural effusion or pneumothorax.
IMPRESSION: Mild atelectatic changes right lung base.

## 2018-09-10 DIAGNOSIS — M1000 Idiopathic gout, unspecified site: Secondary | ICD-10-CM | POA: Diagnosis not present

## 2018-09-10 DIAGNOSIS — I10 Essential (primary) hypertension: Secondary | ICD-10-CM | POA: Diagnosis not present

## 2018-09-10 DIAGNOSIS — R7301 Impaired fasting glucose: Secondary | ICD-10-CM | POA: Diagnosis not present

## 2018-09-14 DIAGNOSIS — M1000 Idiopathic gout, unspecified site: Secondary | ICD-10-CM | POA: Diagnosis not present

## 2018-09-14 DIAGNOSIS — R7301 Impaired fasting glucose: Secondary | ICD-10-CM | POA: Diagnosis not present

## 2018-09-14 DIAGNOSIS — I10 Essential (primary) hypertension: Secondary | ICD-10-CM | POA: Diagnosis not present

## 2018-09-14 DIAGNOSIS — Z79899 Other long term (current) drug therapy: Secondary | ICD-10-CM | POA: Diagnosis not present

## 2018-09-18 DIAGNOSIS — M25539 Pain in unspecified wrist: Secondary | ICD-10-CM | POA: Diagnosis not present

## 2018-09-18 DIAGNOSIS — M79643 Pain in unspecified hand: Secondary | ICD-10-CM | POA: Diagnosis not present

## 2018-09-18 DIAGNOSIS — Z79899 Other long term (current) drug therapy: Secondary | ICD-10-CM | POA: Diagnosis not present

## 2018-09-18 DIAGNOSIS — M1A09X0 Idiopathic chronic gout, multiple sites, without tophus (tophi): Secondary | ICD-10-CM | POA: Diagnosis not present

## 2018-09-18 DIAGNOSIS — M0589 Other rheumatoid arthritis with rheumatoid factor of multiple sites: Secondary | ICD-10-CM | POA: Diagnosis not present

## 2018-10-26 ENCOUNTER — Other Ambulatory Visit: Payer: Self-pay

## 2018-10-26 DIAGNOSIS — C44629 Squamous cell carcinoma of skin of left upper limb, including shoulder: Secondary | ICD-10-CM | POA: Diagnosis not present

## 2018-11-01 DIAGNOSIS — M2012 Hallux valgus (acquired), left foot: Secondary | ICD-10-CM | POA: Diagnosis not present

## 2018-11-01 DIAGNOSIS — M79672 Pain in left foot: Secondary | ICD-10-CM | POA: Diagnosis not present

## 2018-11-01 DIAGNOSIS — M67472 Ganglion, left ankle and foot: Secondary | ICD-10-CM | POA: Diagnosis not present

## 2019-01-01 DIAGNOSIS — Z79899 Other long term (current) drug therapy: Secondary | ICD-10-CM | POA: Diagnosis not present

## 2019-01-01 DIAGNOSIS — M0589 Other rheumatoid arthritis with rheumatoid factor of multiple sites: Secondary | ICD-10-CM | POA: Diagnosis not present

## 2019-01-01 DIAGNOSIS — M8589 Other specified disorders of bone density and structure, multiple sites: Secondary | ICD-10-CM | POA: Diagnosis not present

## 2019-01-01 DIAGNOSIS — M25539 Pain in unspecified wrist: Secondary | ICD-10-CM | POA: Diagnosis not present

## 2019-01-01 DIAGNOSIS — M1A09X0 Idiopathic chronic gout, multiple sites, without tophus (tophi): Secondary | ICD-10-CM | POA: Diagnosis not present

## 2019-01-01 DIAGNOSIS — M79643 Pain in unspecified hand: Secondary | ICD-10-CM | POA: Diagnosis not present

## 2019-01-07 DIAGNOSIS — M47816 Spondylosis without myelopathy or radiculopathy, lumbar region: Secondary | ICD-10-CM | POA: Diagnosis not present

## 2019-01-17 DIAGNOSIS — M06872 Other specified rheumatoid arthritis, left ankle and foot: Secondary | ICD-10-CM | POA: Diagnosis not present

## 2019-01-17 DIAGNOSIS — M7742 Metatarsalgia, left foot: Secondary | ICD-10-CM | POA: Diagnosis not present

## 2019-01-17 DIAGNOSIS — M79672 Pain in left foot: Secondary | ICD-10-CM | POA: Diagnosis not present

## 2019-01-29 DIAGNOSIS — M47816 Spondylosis without myelopathy or radiculopathy, lumbar region: Secondary | ICD-10-CM | POA: Diagnosis not present

## 2019-03-06 DIAGNOSIS — Z79899 Other long term (current) drug therapy: Secondary | ICD-10-CM | POA: Diagnosis not present

## 2019-03-06 DIAGNOSIS — M1A09X0 Idiopathic chronic gout, multiple sites, without tophus (tophi): Secondary | ICD-10-CM | POA: Diagnosis not present

## 2019-03-06 DIAGNOSIS — M0589 Other rheumatoid arthritis with rheumatoid factor of multiple sites: Secondary | ICD-10-CM | POA: Diagnosis not present

## 2019-03-06 DIAGNOSIS — N289 Disorder of kidney and ureter, unspecified: Secondary | ICD-10-CM | POA: Diagnosis not present

## 2019-03-06 DIAGNOSIS — I10 Essential (primary) hypertension: Secondary | ICD-10-CM | POA: Diagnosis not present

## 2019-04-11 DIAGNOSIS — R739 Hyperglycemia, unspecified: Secondary | ICD-10-CM | POA: Diagnosis not present

## 2019-04-11 DIAGNOSIS — Z0000 Encounter for general adult medical examination without abnormal findings: Secondary | ICD-10-CM | POA: Diagnosis not present

## 2019-04-11 DIAGNOSIS — Z7689 Persons encountering health services in other specified circumstances: Secondary | ICD-10-CM | POA: Diagnosis not present

## 2019-04-11 DIAGNOSIS — I10 Essential (primary) hypertension: Secondary | ICD-10-CM | POA: Diagnosis not present

## 2019-04-11 DIAGNOSIS — E785 Hyperlipidemia, unspecified: Secondary | ICD-10-CM | POA: Diagnosis not present

## 2019-04-15 DIAGNOSIS — H353131 Nonexudative age-related macular degeneration, bilateral, early dry stage: Secondary | ICD-10-CM | POA: Diagnosis not present

## 2019-04-15 DIAGNOSIS — Z961 Presence of intraocular lens: Secondary | ICD-10-CM | POA: Diagnosis not present

## 2019-04-18 DIAGNOSIS — Z0000 Encounter for general adult medical examination without abnormal findings: Secondary | ICD-10-CM | POA: Diagnosis not present

## 2019-04-18 DIAGNOSIS — Z79899 Other long term (current) drug therapy: Secondary | ICD-10-CM | POA: Diagnosis not present

## 2019-04-18 DIAGNOSIS — I10 Essential (primary) hypertension: Secondary | ICD-10-CM | POA: Diagnosis not present

## 2019-04-18 DIAGNOSIS — E785 Hyperlipidemia, unspecified: Secondary | ICD-10-CM | POA: Diagnosis not present

## 2019-04-18 DIAGNOSIS — R739 Hyperglycemia, unspecified: Secondary | ICD-10-CM | POA: Diagnosis not present

## 2019-04-30 DIAGNOSIS — Z0000 Encounter for general adult medical examination without abnormal findings: Secondary | ICD-10-CM | POA: Diagnosis not present

## 2019-05-28 DIAGNOSIS — S40011A Contusion of right shoulder, initial encounter: Secondary | ICD-10-CM | POA: Diagnosis not present

## 2019-05-28 DIAGNOSIS — M25511 Pain in right shoulder: Secondary | ICD-10-CM | POA: Diagnosis not present

## 2019-05-28 DIAGNOSIS — M24811 Other specific joint derangements of right shoulder, not elsewhere classified: Secondary | ICD-10-CM | POA: Diagnosis not present

## 2019-06-11 DIAGNOSIS — R251 Tremor, unspecified: Secondary | ICD-10-CM | POA: Diagnosis not present

## 2019-06-13 DIAGNOSIS — M7742 Metatarsalgia, left foot: Secondary | ICD-10-CM | POA: Diagnosis not present

## 2019-06-13 DIAGNOSIS — M7741 Metatarsalgia, right foot: Secondary | ICD-10-CM | POA: Diagnosis not present

## 2019-06-13 DIAGNOSIS — M79671 Pain in right foot: Secondary | ICD-10-CM | POA: Diagnosis not present

## 2019-06-13 DIAGNOSIS — M79672 Pain in left foot: Secondary | ICD-10-CM | POA: Diagnosis not present

## 2019-07-10 DIAGNOSIS — M1A09X0 Idiopathic chronic gout, multiple sites, without tophus (tophi): Secondary | ICD-10-CM | POA: Diagnosis not present

## 2019-07-10 DIAGNOSIS — N289 Disorder of kidney and ureter, unspecified: Secondary | ICD-10-CM | POA: Diagnosis not present

## 2019-07-10 DIAGNOSIS — M0589 Other rheumatoid arthritis with rheumatoid factor of multiple sites: Secondary | ICD-10-CM | POA: Diagnosis not present

## 2019-07-10 DIAGNOSIS — I10 Essential (primary) hypertension: Secondary | ICD-10-CM | POA: Diagnosis not present

## 2019-07-10 DIAGNOSIS — Z79899 Other long term (current) drug therapy: Secondary | ICD-10-CM | POA: Diagnosis not present

## 2019-07-16 DIAGNOSIS — M533 Sacrococcygeal disorders, not elsewhere classified: Secondary | ICD-10-CM | POA: Diagnosis not present

## 2019-08-19 ENCOUNTER — Other Ambulatory Visit: Payer: Self-pay

## 2019-08-19 DIAGNOSIS — M1A09X0 Idiopathic chronic gout, multiple sites, without tophus (tophi): Secondary | ICD-10-CM | POA: Diagnosis not present

## 2019-08-19 DIAGNOSIS — L814 Other melanin hyperpigmentation: Secondary | ICD-10-CM | POA: Diagnosis not present

## 2019-08-19 DIAGNOSIS — M0589 Other rheumatoid arthritis with rheumatoid factor of multiple sites: Secondary | ICD-10-CM | POA: Diagnosis not present

## 2019-08-19 DIAGNOSIS — N289 Disorder of kidney and ureter, unspecified: Secondary | ICD-10-CM | POA: Diagnosis not present

## 2019-08-19 DIAGNOSIS — Z79899 Other long term (current) drug therapy: Secondary | ICD-10-CM | POA: Diagnosis not present

## 2019-08-19 DIAGNOSIS — L570 Actinic keratosis: Secondary | ICD-10-CM | POA: Diagnosis not present

## 2019-08-19 DIAGNOSIS — I10 Essential (primary) hypertension: Secondary | ICD-10-CM | POA: Diagnosis not present

## 2019-08-19 DIAGNOSIS — D485 Neoplasm of uncertain behavior of skin: Secondary | ICD-10-CM | POA: Diagnosis not present

## 2019-08-19 DIAGNOSIS — L818 Other specified disorders of pigmentation: Secondary | ICD-10-CM | POA: Diagnosis not present

## 2019-10-01 DIAGNOSIS — Z23 Encounter for immunization: Secondary | ICD-10-CM | POA: Diagnosis not present

## 2019-10-03 DIAGNOSIS — M47816 Spondylosis without myelopathy or radiculopathy, lumbar region: Secondary | ICD-10-CM | POA: Diagnosis not present

## 2019-10-10 DIAGNOSIS — M1A09X0 Idiopathic chronic gout, multiple sites, without tophus (tophi): Secondary | ICD-10-CM | POA: Diagnosis not present

## 2019-10-10 DIAGNOSIS — Z79899 Other long term (current) drug therapy: Secondary | ICD-10-CM | POA: Diagnosis not present

## 2019-10-10 DIAGNOSIS — I10 Essential (primary) hypertension: Secondary | ICD-10-CM | POA: Diagnosis not present

## 2019-10-10 DIAGNOSIS — M0589 Other rheumatoid arthritis with rheumatoid factor of multiple sites: Secondary | ICD-10-CM | POA: Diagnosis not present

## 2019-10-10 DIAGNOSIS — Z0000 Encounter for general adult medical examination without abnormal findings: Secondary | ICD-10-CM | POA: Diagnosis not present

## 2019-10-10 DIAGNOSIS — N289 Disorder of kidney and ureter, unspecified: Secondary | ICD-10-CM | POA: Diagnosis not present

## 2019-10-17 DIAGNOSIS — I10 Essential (primary) hypertension: Secondary | ICD-10-CM | POA: Diagnosis not present

## 2019-10-17 DIAGNOSIS — E785 Hyperlipidemia, unspecified: Secondary | ICD-10-CM | POA: Diagnosis not present

## 2019-11-04 DIAGNOSIS — G4700 Insomnia, unspecified: Secondary | ICD-10-CM | POA: Diagnosis not present

## 2020-01-07 DIAGNOSIS — M1A09X0 Idiopathic chronic gout, multiple sites, without tophus (tophi): Secondary | ICD-10-CM | POA: Diagnosis not present

## 2020-01-07 DIAGNOSIS — M0589 Other rheumatoid arthritis with rheumatoid factor of multiple sites: Secondary | ICD-10-CM | POA: Diagnosis not present

## 2020-01-07 DIAGNOSIS — N289 Disorder of kidney and ureter, unspecified: Secondary | ICD-10-CM | POA: Diagnosis not present

## 2020-01-07 DIAGNOSIS — Z79899 Other long term (current) drug therapy: Secondary | ICD-10-CM | POA: Diagnosis not present

## 2020-03-12 DIAGNOSIS — M79671 Pain in right foot: Secondary | ICD-10-CM | POA: Diagnosis not present

## 2020-03-12 DIAGNOSIS — M10071 Idiopathic gout, right ankle and foot: Secondary | ICD-10-CM | POA: Diagnosis not present

## 2020-04-08 DIAGNOSIS — Z79899 Other long term (current) drug therapy: Secondary | ICD-10-CM | POA: Diagnosis not present

## 2020-04-08 DIAGNOSIS — M0589 Other rheumatoid arthritis with rheumatoid factor of multiple sites: Secondary | ICD-10-CM | POA: Diagnosis not present

## 2020-04-08 DIAGNOSIS — N289 Disorder of kidney and ureter, unspecified: Secondary | ICD-10-CM | POA: Diagnosis not present

## 2020-04-08 DIAGNOSIS — M1A09X0 Idiopathic chronic gout, multiple sites, without tophus (tophi): Secondary | ICD-10-CM | POA: Diagnosis not present

## 2020-04-08 DIAGNOSIS — M1990 Unspecified osteoarthritis, unspecified site: Secondary | ICD-10-CM | POA: Diagnosis not present

## 2020-04-20 DIAGNOSIS — I10 Essential (primary) hypertension: Secondary | ICD-10-CM | POA: Diagnosis not present

## 2020-05-04 DIAGNOSIS — I10 Essential (primary) hypertension: Secondary | ICD-10-CM | POA: Diagnosis not present

## 2020-05-04 DIAGNOSIS — R5383 Other fatigue: Secondary | ICD-10-CM | POA: Diagnosis not present

## 2020-05-04 DIAGNOSIS — G4700 Insomnia, unspecified: Secondary | ICD-10-CM | POA: Diagnosis not present

## 2020-05-04 DIAGNOSIS — M1A0790 Idiopathic chronic gout, unspecified ankle and foot, without tophus (tophi): Secondary | ICD-10-CM | POA: Diagnosis not present

## 2020-07-22 ENCOUNTER — Ambulatory Visit (INDEPENDENT_AMBULATORY_CARE_PROVIDER_SITE_OTHER): Payer: Medicare Other | Admitting: Ophthalmology

## 2020-07-22 ENCOUNTER — Other Ambulatory Visit: Payer: Self-pay

## 2020-07-22 ENCOUNTER — Encounter (INDEPENDENT_AMBULATORY_CARE_PROVIDER_SITE_OTHER): Payer: Self-pay | Admitting: Ophthalmology

## 2020-07-22 DIAGNOSIS — H353132 Nonexudative age-related macular degeneration, bilateral, intermediate dry stage: Secondary | ICD-10-CM | POA: Diagnosis not present

## 2020-07-22 DIAGNOSIS — H43811 Vitreous degeneration, right eye: Secondary | ICD-10-CM | POA: Diagnosis not present

## 2020-07-22 DIAGNOSIS — H43822 Vitreomacular adhesion, left eye: Secondary | ICD-10-CM | POA: Diagnosis not present

## 2020-07-22 NOTE — Assessment & Plan Note (Signed)
Vitreomacular traction may cause vision loss from anatomic distortion to the center of the vision, the macula.  If visual function is symptomatic or threatened, therapy may be needed.  Surgical intervention offers the highest chance of visual stability and improvement.  Distortion of the macula anatomy may cause splitting of the retinal layers, termed foveomacular retinoschisis, which can cause more permanent vision loss.  Epiretinal membranes may also be associated.  Macular hole may also develop if vitreomacular traction progresses. The minor form of this condition is Vitreomacular adhesion, which is a natural change in the aging process of the eye, which requires observation only.  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

## 2020-07-22 NOTE — Assessment & Plan Note (Signed)

## 2020-07-22 NOTE — Patient Instructions (Signed)
Age-Related Macular Degeneration  Age-related macular degeneration (AMD) is an eye disease related to aging. The disease causes a loss of central vision. Central vision allows a person to see objects clearly and do daily tasks like reading and driving. There are two main types of AMD:  Dry AMD. People with this type generally lose their vision slowly. This is the most common type of AMD. Some people with dry AMD notice very little change in their vision as they age.  Wet AMD. People with this type can lose their vision quickly. What are the causes? This condition is caused by damage to the part of the eye that provides you with central vision (macula).  Dry AMD happens when deposits in the macula cause light-sensitive cells to slowly break down.  Wet AMD happens when abnormal blood vessels grow under the macula and leak blood and fluid. What increases the risk? You are more likely to develop this condition if you:  Are 50 years old or older, and especially 75 years old or older.  Smoke.  Are obese.  Have a family history of AMD.  Have high cholesterol, high blood pressure, or heart disease.  Have been exposed to high levels of ultraviolet (UV) light and blue light.  Are white (Caucasian).  Are male. What are the signs or symptoms? Common symptoms of this condition include:  Blurred vision, especially when reading print material. The blurred vision often improves in brighter light.  A blurred or blind spot in the center of your field of vision that is small but growing larger.  Bright colors seeming less bright than they used to be.  Decreased ability to recognize and see faces.  One eye seeing worse than the other.  Decreased ability to adapt to dimly lit rooms.  Straight lines appearing crooked or wavy. How is this diagnosed? This condition is diagnosed based on your symptoms and an eye exam. During the eye exam:  Eye drops will be placed into your eyes to  enlarge (dilate) your pupils. This will allow your health care provider to see the back of your eye.  You may be asked to look at an image that looks like a checkerboard (Amsler grid). Early changes in your central vision may cause the grid to appear distorted. After the exam, you may be given one or both of these tests:  Fluorescein angiogram. This test determines whether you have dry or wet AMD.  Optical coherence tomography (OCT) test to evaluate deep layers of the retina. How is this treated? There is no cure for this condition, but treatment can help to slow down progression of the disease. This condition may be treated with:  Supplements, including vitamin C, vitamin E, beta carotene, and zinc.  Laser surgery to destroy new blood vessels or leaking blood vessels in your eye.  Injections of medicines into your eye to slow down the formation of abnormal blood vessels that may leak. These injections may need to be repeated on a routine basis. Follow these instructions at home:  Take over-the-counter and prescription medicines only as told by your health care provider.  Take vitamins and supplements as told by your health care provider.  Ask your health care provider for an Amsler grid. Use it every day to check each eye for vision changes.  Get an eye exam as often as told by your health care provider. Make sure to get an eye exam at least once every year.  Keep all follow-up visits as told by   your health care provider. This is important. Contact a health care provider if:  You notice any new changes in your vision. Get help right away if:  You suddenly lose vision or develop pain in the eye. Summary  Age-related macular degeneration (AMD) is an eye disease related to aging. There are two types of this condition: dry AMD and wet AMD.  This condition is caused by damage to the part of the eye that provides you with central vision (macula).  Once diagnosed with AMD, make sure  to get an eye exam every year, take supplements and vitamins as directed, use an Amsler grid at home, and follow up with your health care provider. This information is not intended to replace advice given to you by your health care provider. Make sure you discuss any questions you have with your health care provider. Document Revised: 02/28/2018 Document Reviewed: 02/28/2018 [employer]vier Patient Education  2020 [employer]  

## 2020-07-22 NOTE — Assessment & Plan Note (Signed)

## 2020-07-22 NOTE — Progress Notes (Signed)
07/22/2020     CHIEF COMPLAINT Patient presents for Retina Evaluation   HISTORY OF PRESENT ILLNESS: [name] is a 74 y.o. male who presents to the clinic today for:   HPI    Retina Evaluation    In both eyes.  This started 1 month ago.  Duration of 1 month.  Context:  watching TV and distance vision.  Treatments tried include no treatments.  Response to treatment was no improvement.          Comments    NP AMD OU - Ref'd by Dr. [name]  Pt c/o blurry VA off and on at night only when watching TV x 1 mo approx. Pt c/o HA off and on when watching TV at night as well. Pt denies difficulty reading. Pt denies distortion OU.       Last edited by Rockie Neighbours, [place] on 07/22/2020  2:31 PM. (History)      Referring physician: [name], MD [address] [place],  [place] [address]  HISTORICAL INFORMATION:   Selected notes from the [facility]: No current outpatient medications on file. (Ophthalmic Drugs)   No current facility-administered medications for this visit. (Ophthalmic Drugs)   Current Outpatient Medications (Other)  Medication Sig  . allopurinol (ZYLOPRIM) 300 MG tablet Take 300 mg by mouth daily.  . Cyanocobalamin (B-12) 1000 MCG CAPS Take 1 capsule by mouth every evening.   . escitalopram (LEXAPRO) 20 MG tablet Take 20 mg by mouth at bedtime.   . folic acid (FOLVITE) 1 MG tablet Take 1 mg by mouth daily.  [name] loratadine (CLARITIN) 10 MG tablet Take 10 mg by mouth daily as needed for allergies.  [name] losartan (COZAAR) 50 MG tablet Take 50 mg by mouth at bedtime.   . methotrexate (50 MG/ML) 1 G injection Inject 25 mg into the vein once a week. On Fridays  . Multiple Vitamins-Minerals (CENTRUM SILVER 50+MEN) TABS Take 1 tablet by mouth daily.  . Omega-3 Fatty Acids (FISH OIL) 1200 MG CAPS Take 6,000 mg by mouth daily. Takes 5 capsules every morning  . ondansetron (ZOFRAN ODT) 4 MG disintegrating tablet 4mg  ODT  q4 hours prn nausea/vomit  . oseltamivir (TAMIFLU) 30 MG capsule Take 1 capsule (30 mg total) by mouth every 12 (twelve) hours.  . pantoprazole (PROTONIX) 40 MG tablet Take 40 mg by mouth 2 (two) times daily.   . predniSONE (DELTASONE) 5 MG tablet Take 5 mg by mouth daily with breakfast.  . testosterone cypionate (DEPOTESTOSTERONE CYPIONATE) 200 MG/ML injection Inject 1 mL into the muscle. Every 3 weeks  . traMADol-acetaminophen (ULTRACET) 37.5-325 MG per tablet Take 1 tablet by mouth 2 (two) times daily as needed for moderate pain (takes 1 dose every morning and another dose only if needed for pain).    No current facility-administered medications for this visit. (Other)      REVIEW OF SYSTEMS:    ALLERGIES No Known Allergies  PAST MEDICAL HISTORY Past Medical History:  Diagnosis Date  . Anxiety   . Depression   . GERD (gastroesophageal reflux disease)   . Gout   . History of stomach ulcers 2000  . Hyperlipidemia   . Hypertension   . Rheumatoid arthritis ([place])    Rheumatoid    Past Surgical History:  Procedure Laterality Date  . APPENDECTOMY  1970  . back fusion  2000  . CHOLECYSTECTOMY     06/06/17 Dr. Harlow Asa 06/06/17  .  CHOLECYSTECTOMY N/A 06/06/2017   Procedure: LAPAROSCOPIC CHOLECYSTECTOMY WITH INTRAOPERATIVE CHOLANGIOGRAM;  Surgeon: [name], MD;  Location: [facility];  Service: General;  Laterality: N/A;  . EYE SURGERY     Dr. [name]  lens implants  . HERNIA REPAIR     umbilical  . LUMBAR LAMINECTOMY  2000  . TIBIA FRACTURE SURGERY Left 1998   with titanium rods  . UMBILICAL HERNIA REPAIR N/A 06/06/2017   Procedure: UMBILICAL HERNIA REPAIR;  Surgeon: [name], MD;  Location: [facility];  Service: General;  Laterality: N/A;    FAMILY HISTORY Family History  Problem Relation Age of Onset  . CVA Mother   . Colon cancer Neg Hx     SOCIAL HISTORY Social History   Tobacco Use  . Smoking status: Former Smoker    Packs/day: 2.00    Years: 20.00    Pack  years: 40.00    Types: Cigarettes    Quit date: 08/08/1978    Years since quitting: 41.9  . Smokeless tobacco: Never Used  Vaping Use  . Vaping Use: Never used  Substance Use Topics  . Alcohol use: No  . Drug use: No         OPHTHALMIC EXAM:  Base Eye Exam    Visual Acuity (ETDRS)      Right Left   Dist [place] 20/60 +2 20/60   Dist ph [place] 20/30 +1 20/30 +2       Tonometry (Tonopen, 2:31 PM)      Right Left   Pressure 12 15       Pupils      Pupils Dark Light Shape React APD   Right PERRL 4 3 Round Brisk None   Left PERRL 4 3 Round Brisk None       Visual Fields (Counting fingers)      Left Right    Full Full       Extraocular Movement      Right Left    Full Full       Neuro/Psych    Oriented x3: Yes   Mood/Affect: Normal       Dilation    Both eyes: 1.0% Mydriacyl, 2.5% Phenylephrine @ 2:36 PM        Slit Lamp and Fundus Exam    External Exam      Right Left   External Normal Normal       Slit Lamp Exam      Right Left   Lids/Lashes Normal Normal   Conjunctiva/Sclera White and quiet White and quiet   Cornea Clear Clear   Anterior Chamber Deep and quiet Deep and quiet   Iris Round and reactive Round and reactive   Lens Centered posterior chamber intraocular lens Centered posterior chamber intraocular lens   Anterior Vitreous Normal Normal       Fundus Exam      Right Left   Posterior Vitreous Normal Normal   Disc Normal Normal   C/D Ratio 0.45 0.45   Macula Soft drusen, no hemorrhage, no macular thickening, no membrane, Retinal pigment epithelial mottling, Intermediate age related macular degeneration Soft drusen, no hemorrhage, no macular thickening, no membrane, Retinal pigment epithelial mottling, Intermediate age related macular degeneration   Vessels Normal Normal   Periphery Normal Normal          IMAGING AND PROCEDURES  Imaging and Procedures for 07/22/20  OCT, Retina - OU - Both Eyes       Right Eye Quality was good. Scan  locations included subfoveal. Central Foveal Thickness: 288. Progression has no prior data. Findings include no SRF, no IRF.   Left Eye Quality was good. Scan locations included subfoveal. Central Foveal Thickness: 305. Progression has no prior data. Findings include vitreomacular adhesion , retinal drusen , no SRF, no IRF.   Notes Incidental posterior vitreous detachment right eye  No signs of active CNVM OU                ASSESSMENT/PLAN:  Intermediate stage nonexudative age-related macular degeneration of both eyes The nature of age--related macular degeneration was discussed with the patient as well as the distinction between dry and wet types. Checking an Amsler Grid daily with advice to return immediately should a distortion develop, was given to the patient. The patient 's smoking status now and in the past was determined and advice based on the AREDS study was provided regarding the consumption of antioxidant supplements. AREDS 2 vitamin formulation was recommended. Consumption of dark leafy vegetables and fresh fruits of various colors was recommended. Treatment modalities for wet macular degeneration particularly the use of intravitreal injections of anti-blood vessel growth factors was discussed with the patient. Avastin, Lucentis, and Eylea are the available options. On occasion, therapy includes the use of photodynamic therapy and thermal laser. Stressed to the patient do not rub eyes.  Patient was advised to check Amsler Grid daily and return immediately if changes are noted. Instructions on using the grid were given to the patient. All patient questions were answered.  Posterior vitreous detachment of right eye   The nature of posterior vitreous detachment was discussed with the patient as well as its physiology, its age prevalence, and its possible implication regarding retinal breaks and detachment.  An informational brochure was given to the patient.  All the patient's  questions were answered.  The patient was asked to return if new or different flashes or floaters develops.   Patient was instructed to contact office immediately if any changes were noticed. I explained to the patient that vitreous inside the eye is similar to jello inside a bowl. As the jello melts it can start to pull away from the bowl, similarly the vitreous throughout our lives can begin to pull away from the retina. That process is called a posterior vitreous detachment. In some cases, the vitreous can tug hard enough on the retina to form a retinal tear. I discussed with the patient the signs and symptoms of a retinal detachment.  Do not rub the eye.  Vitreomacular adhesion of left eye Vitreomacular traction may cause vision loss from anatomic distortion to the center of the vision, the macula.  If visual function is symptomatic or threatened, therapy may be needed.  Surgical intervention offers the highest chance of visual stability and improvement.  Distortion of the macula anatomy may cause splitting of the retinal layers, termed foveomacular retinoschisis, which can cause more permanent vision loss.  Epiretinal membranes may also be associated.  Macular hole may also develop if vitreomacular traction progresses. The minor form of this condition is Vitreomacular adhesion, which is a natural change in the aging process of the eye, which requires observation only.  OS, no pathological traction with the vitreomacular adhesion at this point      ICD-10-CM   1. Intermediate stage nonexudative age-related macular degeneration of both eyes  H35.3132 OCT, Retina - OU - Both Eyes  2. Posterior vitreous detachment of right eye  H43.811 OCT, Retina - OU - Both Eyes  3.  Vitreomacular adhesion of left eye  H43.822 OCT, Retina - OU - Both Eyes    1.  Patient instructed in monocular use of Amsler grid testing or other testing to check for monocular distortions or decline in vision on a weekly  basis  2.  3.  Ophthalmic Meds Ordered this visit:  No orders of the defined types were placed in this encounter.      Return in about 6 months (around 01/19/2021) for DILATE OU, OCT.  Patient Instructions  Age-Related Macular Degeneration  Age-related macular degeneration (AMD) is an eye disease related to aging. The disease causes a loss of central vision. Central vision allows a person to see objects clearly and do daily tasks like reading and driving. There are two main types of AMD:  Dry AMD. People with this type generally lose their vision slowly. This is the most common type of AMD. Some people with dry AMD notice very little change in their vision as they age.  Wet AMD. People with this type can lose their vision quickly. What are the causes? This condition is caused by damage to the part of the eye that provides you with central vision (macula).  Dry AMD happens when deposits in the macula cause light-sensitive cells to slowly break down.  Wet AMD happens when abnormal blood vessels grow under the macula and leak blood and fluid. What increases the risk? You are more likely to develop this condition if you:  Are 49 years old or older, and especially 57 years old or older.  Smoke.  Are obese.  Have a family history of AMD.  Have high cholesterol, high blood pressure, or heart disease.  Have been exposed to high levels of ultraviolet (UV) light and blue light.  Are white (Caucasian).  Are male. What are the signs or symptoms? Common symptoms of this condition include:  Blurred vision, especially when reading print material. The blurred vision often improves in brighter light.  A blurred or blind spot in the center of your field of vision that is small but growing larger.  Bright colors seeming less bright than they used to be.  Decreased ability to recognize and see faces.  One eye seeing worse than the other.  Decreased ability to adapt to dimly  lit rooms.  Straight lines appearing crooked or wavy. How is this diagnosed? This condition is diagnosed based on your symptoms and an eye exam. During the eye exam:  Eye drops will be placed into your eyes to enlarge (dilate) your pupils. This will allow your health care provider to see the back of your eye.  You may be asked to look at an image that looks like a checkerboard (Amsler grid). Early changes in your central vision may cause the grid to appear distorted. After the exam, you may be given one or both of these tests:  Fluorescein angiogram. This test determines whether you have dry or wet AMD.  Optical coherence tomography (OCT) test to evaluate deep layers of the retina. How is this treated? There is no cure for this condition, but treatment can help to slow down progression of the disease. This condition may be treated with:  Supplements, including vitamin C, vitamin E, beta carotene, and zinc.  Laser surgery to destroy new blood vessels or leaking blood vessels in your eye.  Injections of medicines into your eye to slow down the formation of abnormal blood vessels that may leak. These injections may need to be repeated on a routine  basis. Follow these instructions at home:  Take over-the-counter and prescription medicines only as told by your health care provider.  Take vitamins and supplements as told by your health care provider.  Ask your health care provider for an Amsler grid. Use it every day to check each eye for vision changes.  Get an eye exam as often as told by your health care provider. Make sure to get an eye exam at least once every year.  Keep all follow-up visits as told by your health care provider. This is important. Contact a health care provider if:  You notice any new changes in your vision. Get help right away if:  You suddenly lose vision or develop pain in the eye. Summary  Age-related macular degeneration (AMD) is an eye disease related  to aging. There are two types of this condition: dry AMD and wet AMD.  This condition is caused by damage to the part of the eye that provides you with central vision (macula).  Once diagnosed with AMD, make sure to get an eye exam every year, take supplements and vitamins as directed, use an Amsler grid at home, and follow up with your health care provider. This information is not intended to replace advice given to you by your health care provider. Make sure you discuss any questions you have with your health care provider. Document Revised: 02/28/2018 Document Reviewed: 02/28/2018 Elsevier Patient Education  [place].    Patient instructed in the use of AREDS 2 vitamin formulation, brand is not important   Explained the diagnoses, plan, and follow up with the patient and they expressed understanding.  Patient expressed understanding of the importance of proper follow up care.   Clent Demark Ninah Moccio M.D. Diseases & Surgery of the Retina and Vitreous Retina & Diabetic [place] 07/22/20     Abbreviations: M myopia (nearsighted); A astigmatism; H hyperopia (farsighted); P presbyopia; Mrx spectacle prescription;  CTL contact lenses; OD right eye; OS left eye; OU both eyes  XT exotropia; ET esotropia; PEK punctate epithelial keratitis; PEE punctate epithelial erosions; DES dry eye syndrome; MGD meibomian gland dysfunction; ATs artificial tears; PFAT's preservative free artificial tears; [place] nuclear sclerotic cataract; PSC posterior subcapsular cataract; ERM epi-retinal membrane; PVD posterior vitreous detachment; RD retinal detachment; DM diabetes mellitus; DR diabetic retinopathy; NPDR non-proliferative diabetic retinopathy; PDR proliferative diabetic retinopathy; CSME clinically significant macular edema; DME diabetic macular edema; dbh dot blot hemorrhages; CWS cotton wool spot; POAG primary open angle glaucoma; C/D cup-to-disc ratio; HVF humphrey visual field; GVF goldmann visual  field; OCT optical coherence tomography; IOP intraocular pressure; BRVO Branch retinal vein occlusion; CRVO central retinal vein occlusion; CRAO central retinal artery occlusion; BRAO branch retinal artery occlusion; RT retinal tear; SB scleral buckle; PPV pars plana vitrectomy; VH Vitreous hemorrhage; PRP panretinal laser photocoagulation; IVK intravitreal kenalog; VMT vitreomacular traction; MH Macular hole;  NVD neovascularization of the disc; NVE neovascularization elsewhere; AREDS age related eye disease study; ARMD age related macular degeneration; POAG primary open angle glaucoma; EBMD epithelial/anterior basement membrane dystrophy; ACIOL anterior chamber intraocular lens; IOL intraocular lens; PCIOL posterior chamber intraocular lens; Phaco/IOL phacoemulsification with intraocular lens placement; [place]ino photorefractive keratectomy; LASIK laser assisted in situ keratomileusis; HTN hypertension; DM diabetes mellitus; COPD chronic obstructive pulmonary disease

## 2020-08-20 DIAGNOSIS — R413 Other amnesia: Secondary | ICD-10-CM | POA: Diagnosis not present

## 2020-08-20 DIAGNOSIS — R197 Diarrhea, unspecified: Secondary | ICD-10-CM | POA: Diagnosis not present

## 2020-10-13 DIAGNOSIS — R1030 Lower abdominal pain, unspecified: Secondary | ICD-10-CM | POA: Diagnosis not present

## 2020-10-13 DIAGNOSIS — I951 Orthostatic hypotension: Secondary | ICD-10-CM | POA: Diagnosis not present

## 2020-10-13 DIAGNOSIS — R42 Dizziness and giddiness: Secondary | ICD-10-CM | POA: Diagnosis not present

## 2020-10-13 DIAGNOSIS — R197 Diarrhea, unspecified: Secondary | ICD-10-CM | POA: Diagnosis not present

## 2020-10-22 DIAGNOSIS — R1030 Lower abdominal pain, unspecified: Secondary | ICD-10-CM | POA: Diagnosis not present

## 2020-10-22 DIAGNOSIS — N2889 Other specified disorders of kidney and ureter: Secondary | ICD-10-CM | POA: Diagnosis not present

## 2020-10-23 DIAGNOSIS — R42 Dizziness and giddiness: Secondary | ICD-10-CM | POA: Diagnosis not present

## 2020-10-23 DIAGNOSIS — R1030 Lower abdominal pain, unspecified: Secondary | ICD-10-CM | POA: Diagnosis not present

## 2020-10-23 DIAGNOSIS — N2889 Other specified disorders of kidney and ureter: Secondary | ICD-10-CM | POA: Diagnosis not present

## 2020-10-26 ENCOUNTER — Other Ambulatory Visit: Payer: Self-pay | Admitting: Family Medicine

## 2020-10-26 DIAGNOSIS — R0689 Other abnormalities of breathing: Secondary | ICD-10-CM | POA: Diagnosis not present

## 2020-10-26 DIAGNOSIS — K219 Gastro-esophageal reflux disease without esophagitis: Secondary | ICD-10-CM | POA: Diagnosis not present

## 2020-10-26 DIAGNOSIS — I10 Essential (primary) hypertension: Secondary | ICD-10-CM | POA: Diagnosis not present

## 2020-10-26 DIAGNOSIS — N2889 Other specified disorders of kidney and ureter: Secondary | ICD-10-CM

## 2020-10-26 DIAGNOSIS — E785 Hyperlipidemia, unspecified: Secondary | ICD-10-CM | POA: Diagnosis not present

## 2020-10-26 DIAGNOSIS — M1990 Unspecified osteoarthritis, unspecified site: Secondary | ICD-10-CM | POA: Diagnosis not present

## 2020-10-26 DIAGNOSIS — M0589 Other rheumatoid arthritis with rheumatoid factor of multiple sites: Secondary | ICD-10-CM | POA: Diagnosis not present

## 2020-10-26 DIAGNOSIS — M1A09X0 Idiopathic chronic gout, multiple sites, without tophus (tophi): Secondary | ICD-10-CM | POA: Diagnosis not present

## 2020-10-26 DIAGNOSIS — Z79899 Other long term (current) drug therapy: Secondary | ICD-10-CM | POA: Diagnosis not present

## 2020-10-26 DIAGNOSIS — N289 Disorder of kidney and ureter, unspecified: Secondary | ICD-10-CM | POA: Diagnosis not present

## 2020-11-02 DIAGNOSIS — M47816 Spondylosis without myelopathy or radiculopathy, lumbar region: Secondary | ICD-10-CM | POA: Diagnosis not present

## 2020-11-03 DIAGNOSIS — M47816 Spondylosis without myelopathy or radiculopathy, lumbar region: Secondary | ICD-10-CM | POA: Diagnosis not present

## 2020-11-12 ENCOUNTER — Ambulatory Visit
Admission: RE | Admit: 2020-11-12 | Discharge: 2020-11-12 | Disposition: A | Discharge status: Home / Self Care | Payer: Medicare Other | Source: Ambulatory Visit | Attending: Family Medicine | Admitting: Family Medicine

## 2020-11-12 DIAGNOSIS — I10 Essential (primary) hypertension: Secondary | ICD-10-CM | POA: Diagnosis not present

## 2020-11-12 DIAGNOSIS — N2889 Other specified disorders of kidney and ureter: Secondary | ICD-10-CM

## 2020-11-12 DIAGNOSIS — M1A09X0 Idiopathic chronic gout, multiple sites, without tophus (tophi): Secondary | ICD-10-CM | POA: Diagnosis not present

## 2020-11-12 DIAGNOSIS — N281 Cyst of kidney, acquired: Secondary | ICD-10-CM | POA: Diagnosis not present

## 2020-11-12 DIAGNOSIS — R7989 Other specified abnormal findings of blood chemistry: Secondary | ICD-10-CM | POA: Diagnosis not present

## 2020-11-12 DIAGNOSIS — R739 Hyperglycemia, unspecified: Secondary | ICD-10-CM | POA: Diagnosis not present

## 2020-11-12 DIAGNOSIS — Z86018 Personal history of other benign neoplasm: Secondary | ICD-10-CM | POA: Diagnosis not present

## 2020-11-12 DIAGNOSIS — E785 Hyperlipidemia, unspecified: Secondary | ICD-10-CM | POA: Diagnosis not present

## 2020-11-12 DIAGNOSIS — I700 Atherosclerosis of aorta: Secondary | ICD-10-CM | POA: Diagnosis not present

## 2020-11-12 DIAGNOSIS — Z9049 Acquired absence of other specified parts of digestive tract: Secondary | ICD-10-CM | POA: Diagnosis not present

## 2020-11-12 MED ORDER — IOPAMIDOL (ISOVUE-300) INJECTION 61%
100.0000 mL | Freq: Once | INTRAVENOUS | Status: AC | PRN
  Administered 2020-11-12: 100 mL via INTRAVENOUS

## 2020-11-18 DIAGNOSIS — R7303 Prediabetes: Secondary | ICD-10-CM | POA: Diagnosis not present

## 2020-11-18 DIAGNOSIS — E785 Hyperlipidemia, unspecified: Secondary | ICD-10-CM | POA: Diagnosis not present

## 2020-11-18 DIAGNOSIS — I10 Essential (primary) hypertension: Secondary | ICD-10-CM | POA: Diagnosis not present

## 2020-11-18 DIAGNOSIS — M1A0790 Idiopathic chronic gout, unspecified ankle and foot, without tophus (tophi): Secondary | ICD-10-CM | POA: Diagnosis not present

## 2020-11-18 DIAGNOSIS — Z0000 Encounter for general adult medical examination without abnormal findings: Secondary | ICD-10-CM | POA: Diagnosis not present

## 2020-11-18 DIAGNOSIS — M0589 Other rheumatoid arthritis with rheumatoid factor of multiple sites: Secondary | ICD-10-CM | POA: Diagnosis not present

## 2020-11-18 DIAGNOSIS — Z79899 Other long term (current) drug therapy: Secondary | ICD-10-CM | POA: Diagnosis not present

## 2020-11-23 DIAGNOSIS — M47816 Spondylosis without myelopathy or radiculopathy, lumbar region: Secondary | ICD-10-CM | POA: Diagnosis not present

## 2020-12-02 DIAGNOSIS — M47816 Spondylosis without myelopathy or radiculopathy, lumbar region: Secondary | ICD-10-CM | POA: Diagnosis not present

## 2020-12-16 DIAGNOSIS — M47816 Spondylosis without myelopathy or radiculopathy, lumbar region: Secondary | ICD-10-CM | POA: Diagnosis not present

## 2020-12-29 DIAGNOSIS — J3489 Other specified disorders of nose and nasal sinuses: Secondary | ICD-10-CM | POA: Diagnosis not present

## 2020-12-29 DIAGNOSIS — I10 Essential (primary) hypertension: Secondary | ICD-10-CM | POA: Diagnosis not present

## 2020-12-29 DIAGNOSIS — R519 Headache, unspecified: Secondary | ICD-10-CM | POA: Diagnosis not present

## 2021-01-06 DIAGNOSIS — Z961 Presence of intraocular lens: Secondary | ICD-10-CM | POA: Diagnosis not present

## 2021-01-06 DIAGNOSIS — H5213 Myopia, bilateral: Secondary | ICD-10-CM | POA: Diagnosis not present

## 2021-01-06 DIAGNOSIS — H353132 Nonexudative age-related macular degeneration, bilateral, intermediate dry stage: Secondary | ICD-10-CM | POA: Diagnosis not present

## 2021-01-06 DIAGNOSIS — R519 Headache, unspecified: Secondary | ICD-10-CM | POA: Diagnosis not present

## 2021-01-06 DIAGNOSIS — H524 Presbyopia: Secondary | ICD-10-CM | POA: Diagnosis not present

## 2021-01-06 DIAGNOSIS — H52203 Unspecified astigmatism, bilateral: Secondary | ICD-10-CM | POA: Diagnosis not present

## 2021-01-11 DIAGNOSIS — M47816 Spondylosis without myelopathy or radiculopathy, lumbar region: Secondary | ICD-10-CM | POA: Diagnosis not present

## 2021-01-19 DIAGNOSIS — M79671 Pain in right foot: Secondary | ICD-10-CM | POA: Diagnosis not present

## 2021-01-19 DIAGNOSIS — M778 Other enthesopathies, not elsewhere classified: Secondary | ICD-10-CM | POA: Diagnosis not present

## 2021-01-21 ENCOUNTER — Encounter (INDEPENDENT_AMBULATORY_CARE_PROVIDER_SITE_OTHER): Payer: Medicare Other | Admitting: Ophthalmology

## 2021-01-27 DIAGNOSIS — M47816 Spondylosis without myelopathy or radiculopathy, lumbar region: Secondary | ICD-10-CM | POA: Diagnosis not present

## 2021-02-09 ENCOUNTER — Encounter (INDEPENDENT_AMBULATORY_CARE_PROVIDER_SITE_OTHER): Payer: Medicare Other | Admitting: Ophthalmology

## 2021-02-26 DIAGNOSIS — M47816 Spondylosis without myelopathy or radiculopathy, lumbar region: Secondary | ICD-10-CM | POA: Diagnosis not present

## 2021-03-06 DIAGNOSIS — S4992XA Unspecified injury of left shoulder and upper arm, initial encounter: Secondary | ICD-10-CM | POA: Diagnosis not present

## 2021-03-06 DIAGNOSIS — R58 Hemorrhage, not elsewhere classified: Secondary | ICD-10-CM | POA: Diagnosis not present

## 2021-03-06 DIAGNOSIS — S299XXA Unspecified injury of thorax, initial encounter: Secondary | ICD-10-CM | POA: Diagnosis not present

## 2021-03-06 DIAGNOSIS — S2020XA Contusion of thorax, unspecified, initial encounter: Secondary | ICD-10-CM | POA: Diagnosis not present

## 2021-03-06 DIAGNOSIS — S2232XA Fracture of one rib, left side, initial encounter for closed fracture: Secondary | ICD-10-CM | POA: Diagnosis not present

## 2021-03-06 DIAGNOSIS — M19012 Primary osteoarthritis, left shoulder: Secondary | ICD-10-CM | POA: Diagnosis not present

## 2021-03-06 DIAGNOSIS — M25512 Pain in left shoulder: Secondary | ICD-10-CM | POA: Diagnosis not present

## 2021-03-06 DIAGNOSIS — S060X0A Concussion without loss of consciousness, initial encounter: Secondary | ICD-10-CM | POA: Diagnosis not present

## 2021-03-06 DIAGNOSIS — S40012A Contusion of left shoulder, initial encounter: Secondary | ICD-10-CM | POA: Diagnosis not present

## 2021-03-06 DIAGNOSIS — W11XXXA Fall on and from ladder, initial encounter: Secondary | ICD-10-CM | POA: Diagnosis not present

## 2021-03-06 DIAGNOSIS — R222 Localized swelling, mass and lump, trunk: Secondary | ICD-10-CM | POA: Diagnosis not present

## 2021-03-06 DIAGNOSIS — M898X1 Other specified disorders of bone, shoulder: Secondary | ICD-10-CM | POA: Diagnosis not present

## 2021-03-06 DIAGNOSIS — M542 Cervicalgia: Secondary | ICD-10-CM | POA: Diagnosis not present

## 2021-03-10 DIAGNOSIS — T148XXD Other injury of unspecified body region, subsequent encounter: Secondary | ICD-10-CM | POA: Diagnosis not present

## 2021-03-10 DIAGNOSIS — S2232XD Fracture of one rib, left side, subsequent encounter for fracture with routine healing: Secondary | ICD-10-CM | POA: Diagnosis not present

## 2021-03-10 DIAGNOSIS — R0781 Pleurodynia: Secondary | ICD-10-CM | POA: Diagnosis not present

## 2021-03-15 DIAGNOSIS — M47816 Spondylosis without myelopathy or radiculopathy, lumbar region: Secondary | ICD-10-CM | POA: Diagnosis not present

## 2021-03-24 DIAGNOSIS — S4292XA Fracture of left shoulder girdle, part unspecified, initial encounter for closed fracture: Secondary | ICD-10-CM | POA: Diagnosis not present

## 2021-03-24 DIAGNOSIS — S42012G Anterior displaced fracture of sternal end of left clavicle, subsequent encounter for fracture with delayed healing: Secondary | ICD-10-CM | POA: Diagnosis not present

## 2021-03-24 DIAGNOSIS — M25512 Pain in left shoulder: Secondary | ICD-10-CM | POA: Diagnosis not present

## 2021-03-30 ENCOUNTER — Telehealth: Payer: Self-pay | Admitting: Family Medicine

## 2021-03-30 NOTE — Telephone Encounter (Signed)
Patient requesting to be [name] with Dr. Anitra Lauth. His wife is already a patient of his. Please advise if okay to schedule.

## 2021-03-31 NOTE — Telephone Encounter (Signed)
Ok to schedule.

## 2021-03-31 NOTE — Telephone Encounter (Signed)
Please assist patient with scheduling, thanks. 

## 2021-04-22 DIAGNOSIS — S42012G Anterior displaced fracture of sternal end of left clavicle, subsequent encounter for fracture with delayed healing: Secondary | ICD-10-CM | POA: Diagnosis not present

## 2021-04-22 DIAGNOSIS — S41112A Laceration without foreign body of left upper arm, initial encounter: Secondary | ICD-10-CM | POA: Diagnosis not present

## 2021-04-22 DIAGNOSIS — M25522 Pain in left elbow: Secondary | ICD-10-CM | POA: Diagnosis not present

## 2021-04-22 DIAGNOSIS — M25512 Pain in left shoulder: Secondary | ICD-10-CM | POA: Diagnosis not present

## 2021-04-22 DIAGNOSIS — S51012A Laceration without foreign body of left elbow, initial encounter: Secondary | ICD-10-CM | POA: Diagnosis not present

## 2021-04-23 ENCOUNTER — Other Ambulatory Visit: Payer: Self-pay

## 2021-04-23 DIAGNOSIS — G4700 Insomnia, unspecified: Secondary | ICD-10-CM | POA: Insufficient documentation

## 2021-04-23 DIAGNOSIS — R5383 Other fatigue: Secondary | ICD-10-CM | POA: Insufficient documentation

## 2021-04-23 DIAGNOSIS — M069 Rheumatoid arthritis, unspecified: Secondary | ICD-10-CM | POA: Insufficient documentation

## 2021-04-23 DIAGNOSIS — G8929 Other chronic pain: Secondary | ICD-10-CM | POA: Insufficient documentation

## 2021-04-23 DIAGNOSIS — M1A9XX0 Chronic gout, unspecified, without tophus (tophi): Secondary | ICD-10-CM | POA: Insufficient documentation

## 2021-04-23 DIAGNOSIS — E785 Hyperlipidemia, unspecified: Secondary | ICD-10-CM | POA: Insufficient documentation

## 2021-04-23 DIAGNOSIS — I10 Essential (primary) hypertension: Secondary | ICD-10-CM | POA: Insufficient documentation

## 2021-04-23 DIAGNOSIS — M1A00X0 Idiopathic chronic gout, unspecified site, without tophus (tophi): Secondary | ICD-10-CM | POA: Insufficient documentation

## 2021-04-26 DIAGNOSIS — I10 Essential (primary) hypertension: Secondary | ICD-10-CM | POA: Diagnosis not present

## 2021-04-26 DIAGNOSIS — E785 Hyperlipidemia, unspecified: Secondary | ICD-10-CM | POA: Diagnosis not present

## 2021-04-26 DIAGNOSIS — M1990 Unspecified osteoarthritis, unspecified site: Secondary | ICD-10-CM | POA: Diagnosis not present

## 2021-04-26 DIAGNOSIS — K219 Gastro-esophageal reflux disease without esophagitis: Secondary | ICD-10-CM | POA: Diagnosis not present

## 2021-04-26 DIAGNOSIS — M0589 Other rheumatoid arthritis with rheumatoid factor of multiple sites: Secondary | ICD-10-CM | POA: Diagnosis not present

## 2021-04-26 DIAGNOSIS — M1A09X0 Idiopathic chronic gout, multiple sites, without tophus (tophi): Secondary | ICD-10-CM | POA: Diagnosis not present

## 2021-04-26 DIAGNOSIS — Z79899 Other long term (current) drug therapy: Secondary | ICD-10-CM | POA: Diagnosis not present

## 2021-04-26 DIAGNOSIS — N289 Disorder of kidney and ureter, unspecified: Secondary | ICD-10-CM | POA: Diagnosis not present

## 2021-04-26 DIAGNOSIS — M549 Dorsalgia, unspecified: Secondary | ICD-10-CM | POA: Diagnosis not present

## 2021-04-29 ENCOUNTER — Ambulatory Visit: Payer: Medicare Other | Admitting: Family Medicine

## 2021-04-29 ENCOUNTER — Encounter: Payer: Self-pay | Admitting: Family Medicine

## 2021-05-06 ENCOUNTER — Encounter: Payer: Self-pay | Admitting: Family Medicine

## 2021-08-03 DIAGNOSIS — M1A09X0 Idiopathic chronic gout, multiple sites, without tophus (tophi): Secondary | ICD-10-CM | POA: Diagnosis not present

## 2021-08-03 DIAGNOSIS — G8929 Other chronic pain: Secondary | ICD-10-CM | POA: Diagnosis not present

## 2021-08-03 DIAGNOSIS — K219 Gastro-esophageal reflux disease without esophagitis: Secondary | ICD-10-CM | POA: Diagnosis not present

## 2021-08-03 DIAGNOSIS — R413 Other amnesia: Secondary | ICD-10-CM | POA: Diagnosis not present

## 2021-08-03 DIAGNOSIS — I10 Essential (primary) hypertension: Secondary | ICD-10-CM | POA: Diagnosis not present

## 2021-08-03 DIAGNOSIS — R7303 Prediabetes: Secondary | ICD-10-CM | POA: Diagnosis not present

## 2021-08-03 DIAGNOSIS — E7800 Pure hypercholesterolemia, unspecified: Secondary | ICD-10-CM | POA: Diagnosis not present

## 2021-08-03 DIAGNOSIS — G4700 Insomnia, unspecified: Secondary | ICD-10-CM | POA: Diagnosis not present

## 2021-08-19 DIAGNOSIS — E785 Hyperlipidemia, unspecified: Secondary | ICD-10-CM | POA: Diagnosis not present

## 2021-08-19 DIAGNOSIS — M1A09X0 Idiopathic chronic gout, multiple sites, without tophus (tophi): Secondary | ICD-10-CM | POA: Diagnosis not present

## 2021-08-19 DIAGNOSIS — M549 Dorsalgia, unspecified: Secondary | ICD-10-CM | POA: Diagnosis not present

## 2021-08-19 DIAGNOSIS — N289 Disorder of kidney and ureter, unspecified: Secondary | ICD-10-CM | POA: Diagnosis not present

## 2021-08-19 DIAGNOSIS — M79643 Pain in unspecified hand: Secondary | ICD-10-CM | POA: Diagnosis not present

## 2021-08-19 DIAGNOSIS — Z79899 Other long term (current) drug therapy: Secondary | ICD-10-CM | POA: Diagnosis not present

## 2021-08-19 DIAGNOSIS — I10 Essential (primary) hypertension: Secondary | ICD-10-CM | POA: Diagnosis not present

## 2021-08-19 DIAGNOSIS — M1990 Unspecified osteoarthritis, unspecified site: Secondary | ICD-10-CM | POA: Diagnosis not present

## 2021-08-19 DIAGNOSIS — K219 Gastro-esophageal reflux disease without esophagitis: Secondary | ICD-10-CM | POA: Diagnosis not present

## 2021-08-19 DIAGNOSIS — M12812 Other specific arthropathies, not elsewhere classified, left shoulder: Secondary | ICD-10-CM | POA: Diagnosis not present

## 2021-08-19 DIAGNOSIS — M0589 Other rheumatoid arthritis with rheumatoid factor of multiple sites: Secondary | ICD-10-CM | POA: Diagnosis not present

## 2021-12-27 ENCOUNTER — Observation Stay (HOSPITAL_COMMUNITY): Payer: Medicare Other

## 2021-12-27 ENCOUNTER — Observation Stay (HOSPITAL_COMMUNITY)
Admission: EM | Admit: 2021-12-27 | Discharge: 2021-12-28 | Disposition: A | Discharge status: Home / Self Care | Payer: Medicare Other | Attending: Internal Medicine | Admitting: Internal Medicine

## 2021-12-27 ENCOUNTER — Encounter (HOSPITAL_COMMUNITY): Payer: Self-pay | Admitting: Emergency Medicine

## 2021-12-27 ENCOUNTER — Emergency Department (HOSPITAL_COMMUNITY): Payer: Medicare Other

## 2021-12-27 DIAGNOSIS — G9389 Other specified disorders of brain: Secondary | ICD-10-CM

## 2021-12-27 DIAGNOSIS — R519 Headache, unspecified: Secondary | ICD-10-CM | POA: Diagnosis not present

## 2021-12-27 DIAGNOSIS — Z20822 Contact with and (suspected) exposure to covid-19: Secondary | ICD-10-CM | POA: Diagnosis not present

## 2021-12-27 DIAGNOSIS — F419 Anxiety disorder, unspecified: Secondary | ICD-10-CM | POA: Diagnosis not present

## 2021-12-27 DIAGNOSIS — I129 Hypertensive chronic kidney disease with stage 1 through stage 4 chronic kidney disease, or unspecified chronic kidney disease: Secondary | ICD-10-CM | POA: Insufficient documentation

## 2021-12-27 DIAGNOSIS — M1990 Unspecified osteoarthritis, unspecified site: Secondary | ICD-10-CM | POA: Insufficient documentation

## 2021-12-27 DIAGNOSIS — D72829 Elevated white blood cell count, unspecified: Secondary | ICD-10-CM | POA: Diagnosis not present

## 2021-12-27 DIAGNOSIS — Z87891 Personal history of nicotine dependence: Secondary | ICD-10-CM | POA: Insufficient documentation

## 2021-12-27 DIAGNOSIS — G8929 Other chronic pain: Secondary | ICD-10-CM

## 2021-12-27 DIAGNOSIS — N1832 Chronic kidney disease, stage 3b: Secondary | ICD-10-CM | POA: Insufficient documentation

## 2021-12-27 DIAGNOSIS — F32A Depression, unspecified: Secondary | ICD-10-CM | POA: Diagnosis present

## 2021-12-27 DIAGNOSIS — H5713 Ocular pain, bilateral: Secondary | ICD-10-CM | POA: Diagnosis present

## 2021-12-27 DIAGNOSIS — G459 Transient cerebral ischemic attack, unspecified: Secondary | ICD-10-CM | POA: Diagnosis not present

## 2021-12-27 DIAGNOSIS — I10 Essential (primary) hypertension: Secondary | ICD-10-CM | POA: Diagnosis present

## 2021-12-27 DIAGNOSIS — E785 Hyperlipidemia, unspecified: Secondary | ICD-10-CM | POA: Diagnosis present

## 2021-12-27 DIAGNOSIS — Z79899 Other long term (current) drug therapy: Secondary | ICD-10-CM | POA: Diagnosis not present

## 2021-12-27 DIAGNOSIS — G9341 Metabolic encephalopathy: Secondary | ICD-10-CM | POA: Diagnosis not present

## 2021-12-27 DIAGNOSIS — R4182 Altered mental status, unspecified: Secondary | ICD-10-CM | POA: Diagnosis present

## 2021-12-27 DIAGNOSIS — R41841 Cognitive communication deficit: Secondary | ICD-10-CM | POA: Insufficient documentation

## 2021-12-27 DIAGNOSIS — R2689 Other abnormalities of gait and mobility: Secondary | ICD-10-CM | POA: Insufficient documentation

## 2021-12-27 LAB — APTT: aPTT: 26 seconds (ref 24–36)

## 2021-12-27 LAB — URINALYSIS, ROUTINE W REFLEX MICROSCOPIC
Bilirubin Urine: NEGATIVE
Glucose, UA: NEGATIVE mg/dL
Hgb urine dipstick: NEGATIVE
Ketones, ur: NEGATIVE mg/dL
Leukocytes,Ua: NEGATIVE
Nitrite: NEGATIVE
Protein, ur: NEGATIVE mg/dL
Specific Gravity, Urine: 1.017 (ref 1.005–1.030)
pH: 6 (ref 5.0–8.0)

## 2021-12-27 LAB — CBC
HCT: 38.5 % — ABNORMAL LOW (ref 39.0–52.0)
Hemoglobin: 13.1 g/dL (ref 13.0–17.0)
MCH: 31 pg (ref 26.0–34.0)
MCHC: 34 g/dL (ref 30.0–36.0)
MCV: 91.2 fL (ref 80.0–100.0)
Platelets: 188 10*3/uL (ref 150–400)
RBC: 4.22 MIL/uL (ref 4.22–5.81)
RDW: 14 % (ref 11.5–15.5)
WBC: 12 10*3/uL — ABNORMAL HIGH (ref 4.0–10.5)
nRBC: 0 % (ref 0.0–0.2)

## 2021-12-27 LAB — DIFFERENTIAL
Abs Immature Granulocytes: 0.04 10*3/uL (ref 0.00–0.07)
Basophils Absolute: 0 10*3/uL (ref 0.0–0.1)
Basophils Relative: 0 %
Eosinophils Absolute: 0.1 10*3/uL (ref 0.0–0.5)
Eosinophils Relative: 1 %
Immature Granulocytes: 0 %
Lymphocytes Relative: 16 %
Lymphs Abs: 1.9 10*3/uL (ref 0.7–4.0)
Monocytes Absolute: 1 10*3/uL (ref 0.1–1.0)
Monocytes Relative: 9 %
Neutro Abs: 8.9 10*3/uL — ABNORMAL HIGH (ref 1.7–7.7)
Neutrophils Relative %: 74 %

## 2021-12-27 LAB — COMPREHENSIVE METABOLIC PANEL
ALT: 17 U/L (ref 0–44)
AST: 21 U/L (ref 15–41)
Albumin: 3.2 g/dL — ABNORMAL LOW (ref 3.5–5.0)
Alkaline Phosphatase: 55 U/L (ref 38–126)
Anion gap: 7 (ref 5–15)
BUN: 23 mg/dL (ref 8–23)
CO2: 30 mmol/L (ref 22–32)
Calcium: 8.8 mg/dL — ABNORMAL LOW (ref 8.9–10.3)
Chloride: 101 mmol/L (ref 98–111)
Creatinine, Ser: 1.67 mg/dL — ABNORMAL HIGH (ref 0.61–1.24)
GFR, Estimated: 42 mL/min — ABNORMAL LOW (ref >60)
Glucose, Bld: 121 mg/dL — ABNORMAL HIGH (ref 70–99)
Potassium: 3.6 mmol/L (ref 3.5–5.1)
Sodium: 138 mmol/L (ref 135–145)
Total Bilirubin: 0.5 mg/dL (ref 0.3–1.2)
Total Protein: 6.4 g/dL — ABNORMAL LOW (ref 6.5–8.1)

## 2021-12-27 LAB — PROTIME-INR
INR: 1 (ref 0.8–1.2)
Prothrombin Time: 13.3 seconds (ref 11.4–15.2)

## 2021-12-27 LAB — RAPID URINE DRUG SCREEN, HOSP PERFORMED
Amphetamines: NOT DETECTED
Barbiturates: NOT DETECTED
Benzodiazepines: NOT DETECTED
Cocaine: NOT DETECTED
Opiates: NOT DETECTED
Tetrahydrocannabinol: NOT DETECTED

## 2021-12-27 LAB — RESP PANEL BY RT-PCR (FLU A&B, COVID) ARPGX2
Influenza A by PCR: NEGATIVE
Influenza B by PCR: NEGATIVE
SARS Coronavirus 2 by RT PCR: NEGATIVE

## 2021-12-27 LAB — ETHANOL: Alcohol, Ethyl (B): <10 mg/dL (ref <10)

## 2021-12-27 MED ORDER — TRAZODONE HCL 50 MG PO TABS: 50.0000 mg | ORAL_TABLET | Freq: Every evening | ORAL | Status: DC | PRN

## 2021-12-27 MED ORDER — SODIUM CHLORIDE 0.9 % IV SOLN: INTRAVENOUS | Status: AC

## 2021-12-27 MED ORDER — FENTANYL CITRATE PF 50 MCG/ML IJ SOSY
50.0000 ug | PREFILLED_SYRINGE | Freq: Once | INTRAMUSCULAR | Status: AC
  Administered 2021-12-27: 50 ug via INTRAVENOUS
  Filled 2021-12-27: qty 1

## 2021-12-27 MED ORDER — ACETAMINOPHEN 650 MG RE SUPP: 650.0000 mg | RECTAL | Status: DC | PRN

## 2021-12-27 MED ORDER — MAGNESIUM SULFATE 2 GM/50ML IV SOLN
2.0000 g | Freq: Two times a day (BID) | INTRAVENOUS | Status: DC
  Administered 2021-12-27 – 2021-12-28 (×3): 2 g via INTRAVENOUS
  Filled 2021-12-27 (×3): qty 50

## 2021-12-27 MED ORDER — SODIUM CHLORIDE 0.9 % IV BOLUS
1000.0000 mL | Freq: Two times a day (BID) | INTRAVENOUS | Status: DC
Start: 2021-12-27 — End: 2021-12-28
  Administered 2021-12-27 – 2021-12-28 (×3): 1000 mL via INTRAVENOUS

## 2021-12-27 MED ORDER — ACETAMINOPHEN 325 MG PO TABS
650.0000 mg | ORAL_TABLET | Freq: Four times a day (QID) | ORAL | Status: DC
  Administered 2021-12-27 – 2021-12-28 (×3): 650 mg via ORAL
  Filled 2021-12-27 (×3): qty 2

## 2021-12-27 MED ORDER — DIPHENHYDRAMINE HCL 50 MG/ML IJ SOLN
25.0000 mg | Freq: Four times a day (QID) | INTRAMUSCULAR | Status: DC
  Administered 2021-12-27 – 2021-12-28 (×5): 25 mg via INTRAVENOUS
  Filled 2021-12-27 (×5): qty 1

## 2021-12-27 MED ORDER — STROKE: EARLY STAGES OF RECOVERY BOOK
Freq: Once | Status: DC
  Filled 2021-12-27: qty 1

## 2021-12-27 MED ORDER — PROCHLORPERAZINE EDISYLATE 10 MG/2ML IJ SOLN
5.0000 mg | Freq: Four times a day (QID) | INTRAMUSCULAR | Status: DC
  Administered 2021-12-27 – 2021-12-28 (×5): 5 mg via INTRAVENOUS
  Filled 2021-12-27 (×5): qty 2

## 2021-12-27 MED ORDER — ACETAMINOPHEN 500 MG PO TABS
1000.0000 mg | ORAL_TABLET | Freq: Four times a day (QID) | ORAL | Status: DC | PRN
  Administered 2021-12-27: 1000 mg via ORAL
  Filled 2021-12-27: qty 2

## 2021-12-27 MED ORDER — ACETAMINOPHEN 325 MG PO TABS
650.0000 mg | ORAL_TABLET | ORAL | Status: DC | PRN
  Administered 2021-12-27 – 2021-12-28 (×2): 650 mg via ORAL
  Filled 2021-12-27 (×2): qty 2

## 2021-12-27 MED ORDER — ACETAMINOPHEN 160 MG/5ML PO SOLN: 650.0000 mg | ORAL | Status: DC | PRN

## 2021-12-27 MED ORDER — ESCITALOPRAM OXALATE 10 MG PO TABS
20.0000 mg | ORAL_TABLET | Freq: Every day | ORAL | Status: DC
  Administered 2021-12-27: 20 mg via ORAL
  Filled 2021-12-27: qty 2

## 2021-12-27 MED ORDER — BUTALBITAL-APAP-CAFFEINE 50-325-40 MG PO TABS
1.0000 | ORAL_TABLET | Freq: Four times a day (QID) | ORAL | Status: DC | PRN
  Administered 2021-12-28: 1 via ORAL
  Filled 2021-12-27: qty 1

## 2021-12-27 MED ORDER — TRAMADOL-ACETAMINOPHEN 37.5-325 MG PO TABS: 1.0000 | ORAL_TABLET | Freq: Two times a day (BID) | ORAL | Status: DC | PRN

## 2021-12-27 MED ORDER — PREDNISONE 5 MG PO TABS
5.0000 mg | ORAL_TABLET | Freq: Every day | ORAL | Status: DC
  Administered 2021-12-28: 5 mg via ORAL
  Filled 2021-12-27: qty 1

## 2021-12-27 MED ORDER — ONDANSETRON HCL 4 MG/2ML IJ SOLN
INTRAMUSCULAR | Status: AC
  Administered 2021-12-27: 4 mg
  Filled 2021-12-27: qty 2

## 2021-12-27 MED ORDER — PRAVASTATIN SODIUM 40 MG PO TABS
40.0000 mg | ORAL_TABLET | Freq: Every day | ORAL | Status: DC
  Administered 2021-12-27 – 2021-12-28 (×2): 40 mg via ORAL
  Filled 2021-12-27 (×2): qty 1

## 2021-12-27 NOTE — ED Triage Notes (Signed)
Pt arrives via EMS from eye doctor appt from across the street with [facility]. Pt was writing his copay check and the staff noticed he was writing numbers instead of letters. Pt endorses headache x1 month.  ?

## 2021-12-27 NOTE — ED Provider Notes (Signed)
? ?Emergency Department Provider Note ? ? ?I have reviewed the triage vital signs and the nursing notes. ? ? ?HISTORY ? ?Chief Complaint ?Altered Mental Status ? ? ?HPI ?[name] is a 76 y.o. male past medical history reviewed below presents emergency department with difficulty writing a check today and an eye doctor appointment.  Patient tells me that he has been having persistent headache and bilateral eye discomfort with redness for the past several weeks.  He went to see his eye doctor this morning and prior to evaluation was writing out a check in the office and could not do so.  EMS report that he was writing numbers were letter should go.  Patient recalled being frustrated and stated that he could not get his right hand to follow his commands.  His wife, at bedside, notes that this morning he did seem a little bit off in terms of his gait.  Last normal was yesterday prior to going to bed.  No vision disturbance.  Headache has been persistent and not significantly worsening.  No fevers or chills.  No prior history of stroke or TIA.  ? ? ?Past Medical History:  ?Diagnosis Date  ? Anxiety and depression   ? Chronic renal insufficiency, stage 3 (moderate) (HCC)   ? DDD (degenerative disc disease), lumbar   ? remote hx of fusion surgery  ? GERD (gastroesophageal reflux disease)   ? Gout   ? History of stomach ulcers 2000  ? Hyperlipidemia   ? Hypertension   ? Rheumatoid arthritis ([place])   ? Rheumatoid   ? ? ?Review of Systems ? ?Constitutional: No fever/chills ?Eyes: No visual changes. ?ENT: No sore throat. ?Cardiovascular: Denies chest pain. ?Respiratory: Denies shortness of breath. ?Gastrointestinal: No abdominal pain.  No nausea, no vomiting.  No diarrhea.  No constipation. ?Genitourinary: Negative for dysuria. ?Musculoskeletal: Negative for back pain. ?Skin: Negative for rash. ?Neurological: Negative for focal weakness or numbness. Positive HA. Difficulty writing today at eye MD appointment.   ? ? ?____________________________________________ ? ? ?PHYSICAL EXAM: ? ?VITAL SIGNS: ?ED Triage Vitals  ?Enc Vitals Group  ?   BP 12/27/21 1021 (!) 149/67  ?   Pulse Rate 12/27/21 1021 88  ?   Resp 12/27/21 1021 18  ?   Temp 12/27/21 1021 98.3 ?F (36.8 ?C)  ?   Temp Source 12/27/21 1021 Oral  ?   SpO2 12/27/21 1021 97 %  ?   Weight 12/27/21 1020 160 lb 15 oz (73 kg)  ?   Height 12/27/21 1020 '5\' 8"'$  (1.727 m)  ? ?Constitutional: Alert and oriented. Well appearing and in no acute distress. ?Eyes: Conjunctivae are slightly injected bilaterally. No discharge. PERRL. EOMI. No photophobia.  ?Head: Atraumatic. ?Nose: No congestion/rhinnorhea. ?Mouth/Throat: Mucous membranes are moist.   ?Neck: No stridor.  No meningeal signs. ?Cardiovascular: Normal rate, regular rhythm. Good peripheral circulation. Grossly normal heart sounds.   ?Respiratory: Normal respiratory effort.  No retractions. Lungs CTAB. ?Gastrointestinal: Soft and nontender. No distention.  ?Musculoskeletal: No lower extremity tenderness nor edema. No gross deformities of extremities. ?Neurologic:  Normal speech and language. No gross focal neurologic deficits are appreciated. Able to wrist normally here in the ED. No appreciable weakness/numbness.  ?Skin:  Skin is warm, dry and intact. No rash noted. ? ?____________________________________________ ?  ?LABS ?(all labs ordered are listed, but only abnormal results are displayed) ? ?Labs Reviewed  ?CBC - Abnormal; Notable for the following components:  ?    Result Value  ? WBC  12.0 (*)   ? HCT 38.5 (*)   ? All other components within normal limits  ?DIFFERENTIAL - Abnormal; Notable for the following components:  ? Neutro Abs 8.9 (*)   ? All other components within normal limits  ?COMPREHENSIVE METABOLIC PANEL - Abnormal; Notable for the following components:  ? Glucose, Bld 121 (*)   ? Creatinine, Ser 1.67 (*)   ? Calcium 8.8 (*)   ? Total Protein 6.4 (*)   ? Albumin 3.2 (*)   ? GFR, Estimated 42 (*)   ? All  other components within normal limits  ?CBC - Abnormal; Notable for the following components:  ? RBC 4.01 (*)   ? Hemoglobin 12.1 (*)   ? HCT 37.5 (*)   ? All other components within normal limits  ?COMPREHENSIVE METABOLIC PANEL - Abnormal; Notable for the following components:  ? Creatinine, Ser 1.53 (*)   ? Calcium 7.9 (*)   ? Total Protein 5.7 (*)   ? Albumin 2.7 (*)   ? GFR, Estimated 47 (*)   ? All other components within normal limits  ?RESP PANEL BY RT-PCR (FLU A&B, COVID) ARPGX2  ?ETHANOL  ?PROTIME-INR  ?APTT  ?RAPID URINE DRUG SCREEN, HOSP PERFORMED  ?URINALYSIS, ROUTINE W REFLEX MICROSCOPIC  ?MAGNESIUM  ?PHOSPHORUS  ? ?____________________________________________ ? ?EKG ? ? EKG Interpretation ? ?Date/Time:  Monday December 27 2021 10:20:55 EDT ?Ventricular Rate:  88 ?PR Interval:  162 ?QRS Duration: 104 ?QT Interval:  368 ?QTC Calculation: 446 ?R Axis:   -57 ?Text Interpretation: Sinus rhythm Incomplete RBBB and LAFB RSR' in V1 or V2, probably normal variant Confirmed by Nanda Quinton [phone]) on 12/27/2021 11:39:42 AM ?  ? ?  ? ?____________________________________________ ? ? ?PROCEDURES ? ?Procedure(s) performed:  ? ?Procedures ? ?None ?____________________________________________ ? ? ?INITIAL IMPRESSION / ASSESSMENT AND PLAN / ED COURSE ? ?Pertinent labs & imaging results that were available during my care of the patient were reviewed by me and considered in my medical decision making (see chart for details). ?  ?This patient is Presenting for Evaluation of AMS, which does require a range of treatment options, and is a complaint that involves a high risk of morbidity and mortality. ? ?The Differential Diagnoses includes but is not exclusive to alcohol, illicit or prescription medications, intracranial pathology such as stroke, intracerebral hemorrhage, fever or infectious causes including sepsis, hypoxemia, uremia, trauma, endocrine related disorders such as diabetes, hypoglycemia, thyroid-related diseases,  etc. ? ? ?Critical Interventions-  ?  ?Medications  ?0.9 %  sodium chloride infusion ( Intravenous New Bag/Given 12/28/21 0937)  ?fentaNYL (SUBLIMAZE) injection 50 mcg (50 mcg Intravenous Given 12/27/21 1241)  ?ondansetron (ZOFRAN) 4 MG/2ML injection (4 mg  Given 12/27/21 1326)  ?gadobutrol (GADAVIST) 1 MMOL/ML injection 5 mL (5 mLs Intravenous Contrast Given 12/28/21 1035)  ? ? ?Reassessment after intervention:  HA slightly improved.  ? ? ?I did obtain Additional Historical Information from wife at bedside. ? ?I decided to review pertinent External Data, and in summary no recent admissions for similar. ?  ?Clinical Laboratory Tests Ordered, included CBC without leukocytosis.  Mild anemia 12.1.  Chronic kidney disease noted with a creatinine of 1.53 with no significant electrolyte disturbance. ? ?Radiologic Tests Ordered, included CT head. I independently interpreted the images and agree with radiology interpretation.  ? ?Cardiac Monitor Tracing which shows NSR. ? ? ?Social Determinants of Health Risk patient is a former smoker.  ? ?Consult complete with Neurology and Hospitalist. Plan for admit.  ? ?Medical Decision Making:  Summary:  ?Patient presents to the emergency department for evaluation of difficulty writing while at the doctor today.  Last normal was yesterday and arrives without focal neurodeficit to prompt a code stroke activation.  Patient is complaining of persistent headache over the past several weeks to month.  Complex migraine is a consideration.  Plan for CT imaging of the head to begin along with labs and will discuss with Neurology.  ? ?Reevaluation with update and discussion with patient. Plan for admit for Neuro/CVA evaluation. They are in agreement. Patient continues to feel well w/o active deficits.  ? ?Disposition: admit ? ?____________________________________________ ? ?FINAL CLINICAL IMPRESSION(S) / ED DIAGNOSES ? ?Final diagnoses:  ?TIA (transient ischemic attack)  ? ? ?Note:  This document  was prepared using Dragon voice recognition software and may include unintentional dictation errors. ? ?Nanda Quinton, MD, FACEP ?Emergency Medicine ? ?  ?Margette Fast, MD ?01/04/22 1032 ? ?

## 2021-12-27 NOTE — H&P (Addendum)
?History and Physical  ? ? ?Patient: [name] IEP:[id number] DOB: [birth year] ?DOA: 12/27/2021 ?DOS: the patient was seen and examined on 12/27/2021 ?PCP: [name], MD  ?Patient coming from: Eye doctor via EMS ? ?Chief Complaint:  ?Chief Complaint  ?Patient presents with  ? Altered Mental Status  ? ?HPI: SAVEON PLANT is a 76 y.o. male with medical history significant of hypertension, hyperlipidemia, CKD stage III, anxiety, depression, and GERD who presents after being found to be acutely altered.  History was obtained from patient with assistance of his wife present at bedside.  Patient is states that he has had a severe constant headache for over a month, but acutely worse over the last few weeks.  Pain is behind both eyes, but usually worse on the right.  Reports sensitivity to light and sound.  He has tried using his home pain medications that he takes for arthritis, Tylenol, and Benadryl without relief in symptoms.  Associated symptoms include dizziness, nausea, and reportedly had an episode of vomiting.[name]  He denies having any significant fever, shortness of breath, cough, chest pain, or diarrhea symptoms.  Patient had not started any new medications recently.  He questioned if something was wrong with one of his corneal lens replacement and had had gone to see his eye doctor today.  However, was reported to be disoriented unable to write his name, did not know who his wife was, or her telephone number for which EMS was called. ? ?On admission into the emergency department patient was seen to be afebrile with blood pressure 140/70 -170/89, and all other vital signs maintained.  Patient was seen as a code stroke by neurology.  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 Labs significant for WBC 12, BUN 23, creatinine 1.67, and INR Influenza and COVID-19 screening were negative.  Alcohol level was undetectable.  Neurology recommended obtaining  MRI.  Patient had been given fentanyl 50 mcg IV. ? ?review of Systems: As mentioned in the history of present illness. All other systems reviewed and are negative. ?Past Medical History:  ?Diagnosis Date  ? Anxiety and depression   ? Chronic renal insufficiency, stage 3 (moderate) (HCC)   ? DDD (degenerative disc disease), lumbar   ? remote hx of fusion surgery  ? GERD (gastroesophageal reflux disease)   ? Gout   ? History of stomach ulcers 2000  ? Hyperlipidemia   ? Hypertension   ? Rheumatoid arthritis ([place])   ? Rheumatoid   ? ?Past Surgical History:  ?Procedure Laterality Date  ? APPENDECTOMY  1968  ? back fusion  2000  ? CHOLECYSTECTOMY N/A 06/06/2017  ? Procedure: LAPAROSCOPIC CHOLECYSTECTOMY WITH INTRAOPERATIVE CHOLANGIOGRAM;  Surgeon: [name], MD;  Location: [facility];  Service: General;  Laterality: N/A;  ? EYE SURGERY    ? Dr. [name]  lens implants  ? HERNIA REPAIR    ? umbilical  ? LUMBAR LAMINECTOMY  2000  ? TIBIA FRACTURE SURGERY Left 1998  ? with titanium rods  ? UMBILICAL HERNIA REPAIR N/A 06/06/2017  ? Procedure: UMBILICAL HERNIA REPAIR;  Surgeon: [name], MD;  Location: [facility];  Service: General;  Laterality: N/A;  ? ?Social History:  reports that he quit smoking about 43 years ago. His smoking use included cigarettes. He has a 40.00 pack-year smoking history. He has never used smokeless tobacco. He reports that he does not drink alcohol and does not use drugs. ? ?No Known Allergies ? ?  Family History  ?Problem Relation Age of Onset  ? CVA Mother   ? Colon cancer Neg Hx   ? ? ?Prior to Admission medications   ?Medication Sig Start Date End Date Taking? Authorizing Provider  ?allopurinol (ZYLOPRIM) 300 MG tablet Take 300 mg by mouth daily.    [provider]  ?escitalopram (LEXAPRO) 20 MG tablet Take 20 mg by mouth at bedtime.     [provider]  ?folic acid (FOLVITE) 1 MG tablet Take 1 mg by mouth daily.    [provider]  ?loratadine (CLARITIN) 10 MG tablet Take  10 mg by mouth daily as needed for allergies.    [provider]  ?losartan (COZAAR) 25 MG tablet Take 50 mg by mouth daily. 04/04/21   [provider]  ?Omega-3 Fatty Acids (FISH OIL) 1200 MG CAPS Take 6,000 mg by mouth daily. Takes 5 capsules every morning    [provider]  ?ondansetron (ZOFRAN ODT) 4 MG disintegrating tablet '4mg'$  ODT q4 hours prn nausea/vomit 10/31/17   [name], MD  ?pantoprazole (PROTONIX) 40 MG tablet Take 40 mg by mouth daily.    [provider]  ?pravastatin (PRAVACHOL) 40 MG tablet Take 40 mg by mouth daily. 04/15/21   [provider]  ?predniSONE (DELTASONE) 5 MG tablet Take 5 mg by mouth daily with breakfast.    [provider]  ?testosterone cypionate (DEPOTESTOSTERONE CYPIONATE) 200 MG/ML injection Inject 1 mL into the muscle. Every 3 weeks 09/20/17   [provider]  ?traMADol-acetaminophen (ULTRACET) 37.5-325 MG per tablet Take 1 tablet by mouth 2 (two) times daily as needed for moderate pain (takes 1 dose every morning and another dose only if needed for pain).     [provider]  ?traZODone (DESYREL) 50 MG tablet Take 50 mg by mouth at bedtime as needed. 02/13/21   [provider]  ? ? ?Physical Exam: ?Vitals:  ? 12/27/21 1115 12/27/21 1130 12/27/21 1222 12/27/21 1300  ?BP: 140/70 (!) 155/76 (!) 168/88 (!) 170/89  ?Pulse: 85 78 74 71  ?Resp: '15 12 17 18  '$ ?Temp:      ?TempSrc:      ?SpO2: 98% 97% 98% 97%  ?Weight:      ?Height:      ? ?Constitutional: Elderly male who appears to be in some discomfort   ?Eyes: Patient had cool rag over eyelids.  Once towel removed no signs of any conjunctival injection appreciated. ?ENMT: Mucous membranes are moist. Posterior pharynx clear of any exudate or lesions.  ?Neck: normal, supple, no masses, no thyromegaly ?Respiratory: clear to auscultation bilaterally, no wheezing, no crackles. Normal respiratory effort. No accessory muscle use.  ?Cardiovascular: Regular  rate and rhythm, no murmurs / rubs / gallops. No extremity edema. 2+ pedal pulses. No carotid bruits.  ?Abdomen: no tenderness, no masses palpated. No hepatosplenomegaly. Bowel sounds positive.  ?Musculoskeletal: no clubbing / cyanosis. No joint deformity upper and lower extremities. Good ROM, no contractures. Normal muscle tone.  ?Skin: no rashes, lesions, ulcers. No induration ?Neurologic: CN 2-12 grossly intact. Strength 5/5 in all 4.  ?Psychiatric: Normal judgment and insight. Alert and oriented x 3. Normal mood.  ? ?Data Reviewed: ? 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 ?Assessment and Plan: ?Acute metabolic encephalopathy ?Patient was noted to be acutely altered while at the eye doctor today unable to complete check.  Unaware of who his wife was or her telephone number.  CT scan of the brain did not note  any acute abnormalities.  Neurology have been formally consulted. ?-Admit to a telemetry bed ?-Neurochecks ?-Follow-up MRI of the brain with and without contrast and orbits ?-[place] neurology consultative services, we will follow-up for further recommendations ? ?Headacheperiorbital pain, bilateral ?Patient reports constant frontal/periorbital headache over the last couple of months, and acutely worsened in the last few weeks.  Patient was given Benadryl, Phenergan, and 2 g of magnesium sulfate IV. ?-continue symptomatic treat ?-Follow-up MRI ?-Dr. Hollice Espy ophthalmology consulted,  will follow-up any further recommendations ? ?Leukocytosis ?Acute.  WBC elevated at 12. ?-Follow-up urinalysis ? ?Essential hypertension ?Home meds appear to include losartan ?-Follow-up MRI ? ?Chronic kidney disease stage IIIb ?Creatinine 1.67 which appears near patient's previous baseline. ?-Continue to monitor kidney function ? ?Chronic arthritis pain ?-Continue Ultracet as needed ? ?Anxiety and depression ?-Continue Lexapro ? ?Hyperlipidemia ?-Continue pravastatin ? ?History of gout ?-Continue allopurinol ? ?Advance Care  Planning:   Code Status: Full Code   ? ?Consults: Neurology and ophthalmology ? ?Family Communication: Wife updated at bedside ? ?Severity of Illness: ?The appropriate patient status for this patient is

## 2021-12-27 NOTE — ED Notes (Signed)
Hollice Espy MD opthalmology at bedside ?

## 2021-12-27 NOTE — Significant Event (Signed)
Ophthalmologist Dr. 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  Discussed with Dr. Arnoldo Morale on-call neurosurgeon who at this time requested getting MRI of the pituitary which I ordered.  If the MRI of the pituitary does not show anything acute findings then patient need to follow-up with Dr. Arnoldo Morale as outpatient. ? ?Gean Birchwood ?

## 2021-12-27 NOTE — ED Notes (Signed)
Pt is alert and oriented, no droop, no slurred speech, pupils equal and reactive but photosentive, equal strength and sensation bilaterally. Pt is reporting severe pain in head and pressure in eyes that has been ongoing and not been relieved with medication received throughout the day. Pt reports that he was unable to complete his MRI due to discomfort in head and back pain that is chronic. ?

## 2021-12-27 NOTE — Consult Note (Signed)
Ophthalmology Initial Consult Note ? ? ?[name], 76 y.o. male ?Date of Service:  12/27/2021 ? ? ?Requesting physician: Norval Morton, MD ? ?Information Obtained from: patient ?Chief Complaint:  Headache and Eye Pain ? ?HPI/Discussion:  [name] is a 76 y.o. male with a month-long headache which is located bifrontal and behind his eyes.  He denies vision changes.  He specifically denies flashing lights, floaters, and sensation of a curtain coming over his vision. ? ?Past Ocular Hx: Age-related macular degeneration, intermediate both eyes; cataract extraction status post intraocular lens implantation, both eyes  ?Ocular Meds: None ?Family ocular history: None ? ?Past Medical History:  ?Diagnosis Date  ? Anxiety and depression   ? Chronic renal insufficiency, stage 3 (moderate) (HCC)   ? DDD (degenerative disc disease), lumbar   ? remote hx of fusion surgery  ? GERD (gastroesophageal reflux disease)   ? Gout   ? History of stomach ulcers 2000  ? Hyperlipidemia   ? Hypertension   ? Rheumatoid arthritis ([place])   ? Rheumatoid   ? ?Past Surgical History:  ?Procedure Laterality Date  ? APPENDECTOMY  1968  ? back fusion  2000  ? CHOLECYSTECTOMY N/A 06/06/2017  ? Procedure: LAPAROSCOPIC CHOLECYSTECTOMY WITH INTRAOPERATIVE CHOLANGIOGRAM;  Surgeon: [name], MD;  Location: [facility];  Service: General;  Laterality: N/A;  ? EYE SURGERY    ? Dr. [name]  lens implants  ? HERNIA REPAIR    ? umbilical  ? LUMBAR LAMINECTOMY  2000  ? TIBIA FRACTURE SURGERY Left 1998  ? with titanium rods  ? UMBILICAL HERNIA REPAIR N/A 06/06/2017  ? Procedure: UMBILICAL HERNIA REPAIR;  Surgeon: [name], MD;  Location: [facility];  Service: General;  Laterality: N/A;  ? ? ?Meds: ?No current facility-administered medications on file prior to encounter.  ? ?Current Outpatient Medications on File Prior to Encounter  ?Medication Sig Dispense Refill  ? traZODone (DESYREL) 50 MG tablet Take 50 mg by mouth at bedtime as needed.    ?  allopurinol (ZYLOPRIM) 300 MG tablet Take 300 mg by mouth daily.    ? escitalopram (LEXAPRO) 20 MG tablet Take 20 mg by mouth at bedtime.     ? folic acid (FOLVITE) 1 MG tablet Take 1 mg by mouth daily.    ? loratadine (CLARITIN) 10 MG tablet Take 10 mg by mouth daily as needed for allergies.    ? losartan (COZAAR) 25 MG tablet Take 50 mg by mouth daily.    ? Omega-3 Fatty Acids (FISH OIL) 1200 MG CAPS Take 6,000 mg by mouth daily. Takes 5 capsules every morning    ? ondansetron (ZOFRAN ODT) 4 MG disintegrating tablet '4mg'$  ODT q4 hours prn nausea/vomit 8 tablet 0  ? pantoprazole (PROTONIX) 40 MG tablet Take 40 mg by mouth daily.    ? pravastatin (PRAVACHOL) 40 MG tablet Take 40 mg by mouth daily.    ? predniSONE (DELTASONE) 5 MG tablet Take 5 mg by mouth daily with breakfast.    ? testosterone cypionate (DEPOTESTOSTERONE CYPIONATE) 200 MG/ML injection Inject 1 mL into the muscle. Every 3 weeks  0  ? traMADol-acetaminophen (ULTRACET) 37.5-325 MG per tablet Take 1 tablet by mouth 2 (two) times daily as needed for moderate pain (takes 1 dose every morning and another dose only if needed for pain).     ?  ? ?No Known Allergies ?Social History  ? ?Tobacco Use  ? Smoking status: Former  ?  Packs/day: 2.00  ?  Years:  20.00  ?  Pack years: 40.00  ?  Types: Cigarettes  ?  Quit date: 08/08/1978  ?  Years since quitting: 43.4  ? Smokeless tobacco: Never  ?Substance Use Topics  ? Alcohol use: No  ? ?Family History  ?Problem Relation Age of Onset  ? CVA Mother   ? Colon cancer Neg Hx   ? ? ?ROS: Other than ROS in the HPI, all other systems were negative. ? ?Exam: ?Temp: 98.3 ?F (36.8 ?C) ?Pulse Rate: 63 ?BP: (!) 182/76 ?Resp: 13 ?SpO2: 94 % ? ?Visual Acuity:  [place]  cc  ph  near  ? OD  +      20/30+2  ? OS  +      20/25-1  ? ? ? OD OS  ?Confr Vis Fields Bitemporal field loss, inferior>superior Bitemporal field loss, inferior>superior  ?EOM (Primary) WNL WNL  ?Conjunctiva - Bulbar WNL WNL  ?Conjunctiva - Palpebral               WNL  WNL  ?Adnexa  WNL WNL  ?Pupils  WNL WNL  ?Reaction, Direct WNL WNL  ?               Consensual WNL WNL  ?               RAPD No No  ?Cornea  WNL WNL  ?Anterior Chamber WNL WNL  ?Lens:  WNL WNL  ?IOP 15 15  ?Fundus - Dilated? yes   ?Optic Disc - C:D Ratio 0.3 0.3  ?                   Appearance  Slight nasal elevation (Grade 1 disc edema) WNL  ?                   NF Layer WNL WNL  ?Post Seg:  Retina    ?                Vessels WNL WNL  ?                Vitreous  WNL WNL  ?                Macula drusen drusen  ?                Periphery WNL WNL  ?    ? ?Neuro:  Oriented to person, place, and time:  Yes ?Psychiatric:  Mood and Affect Appropriate:  Yes ? ?Labs/imaging: ? 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 Optic nerves and orbit wnl on my read. ?  ? ?A/P:  ?76 y.o. 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  His ophthalmologic exam shows no emergent ophthalmic issues or ocular issues that may explain his pain.  ? ?Continued pressure on the optic chiasm or elevated intracranial pressure can lead to permanent vision loss through optic atrophy.  Recommend neurosurgical consult.  Recommend continued outpatient followup with his established eye care provider for formal visual field testing on an outpatient basis and for treatment of his chronic eye disease.  ? ?Delia Chimes 12/27/2021, 8:13 PM ?Ophthalmic Plastic and Reconstructive Surgery ?[employer] ?[phone] ? ? ? ?

## 2021-12-27 NOTE — Consult Note (Addendum)
NEUROLOGY CONSULTATION NOTE  ? ?Date of service: December 27, 2021 ?Patient Name: [name] ?MRN:  [id number] ?DOB:  [birth year] ?Reason for consult: "confusion and worsening headache with nausea" ?Requesting Provider: Norval Morton, MD ? ?History of Present Illness  ?[name] is a 76 y.o. male with PMH Rheumatoid arthritis, HLD, HTN, pre DM2, Gout, GERD, CKD 3, macular degeneration and posterior vitreous detachment in OD, GAD, MDD, who presented to [facility] (12/27/2021) after acute confusion where he unable to fill out form at ophthalmology visit this morning.  ? ?At the office as a receptionist noted his confusion and disorientation where he forgot how to write his name on his check, and called his wife and EMS. On the check as shown to [place], he was able to place info in correct places (correctly writing numbers) with abrupt stop in signing his name. Patient is unable to recall most events at the office, but did note that he had worsening headache, dizziness, with nausea at the office. Patient was able to safely drive himself to the office.  ? ?Patient reported that he has had ongoing headaches for the past 2-3 months, with worsening over the past 2-3 weeks.  Reported headaches are present in the morning, last all day, with worsening around noon until he goes to bed at night.  They used to improve overnight but lately have been constant.  He denied worsening headache with increased intracranial pressure, such as with bowel movements, changing position, waking him up in middle of the night, coughing, or sneezing. Headaches are located frontally and periorbitally, with right worse than left.  He then developed nausea and recently vomiting over the past few days.  He also had associating phonophobia and photophobia.  He also reported transient blurriness and dizziness, but denied diplopia.   ?He denied auras with flashing lights or change in vision.  He denied complete loss of vision in either eyes, but reports his  right eye hurts more and he has more trouble seeing on the right.  He denied weakness, speech difficulties, word finding difficulties, numbness, clumsiness with his headaches. Headache was unrelieved with over-the-counter Tylenol. ? ?Denied jaw claudication or difficulty/fatigue to chewing or eating.  ? ?Reported this has never happened to him before.  He denied any changes in medications or medication dosage 2-3 months ago or 2-3 weeks ago.  Patient's ophthalmology appointment on 12/27/2021 was to see if his  corneal lens replacement surgery >10 years ago was possible cause of his headaches.  EMS arrived during patient's intake, did not receive eye exam from ophthalmology.  ? ?ROS  ? ?Constitutional Denies weight loss, fever and chills.   ?HEENT Denies changes in hearing.   ?Respiratory Denies SOB and cough.   ?CV Denies palpitations and CP   ?GI Denies abdominal pain and diarrhea. Nausea and vomiting.  ?GU Denies dysuria and urinary frequency. No urinary incontinence.  ?MSK Right shoulder pain  ?Skin Denies rash and pruritus.   ?Neurological Headache, photophobia, phonophobia  ?Psychiatric Denies recent changes in mood. Denies anxiety and depression.   ? ?Past History  ? ?Past Medical History:  ?Diagnosis Date  ? Anxiety and depression   ? Chronic renal insufficiency, stage 3 (moderate) (HCC)   ? DDD (degenerative disc disease), lumbar   ? remote hx of fusion surgery  ? GERD (gastroesophageal reflux disease)   ? Gout   ? History of stomach ulcers 2000  ? Hyperlipidemia   ? Hypertension   ? Rheumatoid arthritis ([place])   ?  Rheumatoid   ? ?Past Surgical History:  ?Procedure Laterality Date  ? APPENDECTOMY  1968  ? back fusion  2000  ? CHOLECYSTECTOMY N/A 06/06/2017  ? Procedure: LAPAROSCOPIC CHOLECYSTECTOMY WITH INTRAOPERATIVE CHOLANGIOGRAM;  Surgeon: [name], MD;  Location: [facility];  Service: General;  Laterality: N/A;  ? EYE SURGERY    ? Dr. [name]  lens implants  ? HERNIA REPAIR    ? umbilical  ? LUMBAR  LAMINECTOMY  2000  ? TIBIA FRACTURE SURGERY Left 1998  ? with titanium rods  ? UMBILICAL HERNIA REPAIR N/A 06/06/2017  ? Procedure: UMBILICAL HERNIA REPAIR;  Surgeon: [name], MD;  Location: [facility];  Service: General;  Laterality: N/A;  ? ?Family History  ?Problem Relation Age of Onset  ? CVA Mother   ? Colon cancer Neg Hx   ? ?Social History  ? ?Socioeconomic History  ? Marital status: Married  ?  Spouse name: Not on file  ? Number of children: Not on file  ? Years of education: Not on file  ? Highest education level: Not on file  ?Occupational History  ? Not on file  ?Tobacco Use  ? Smoking status: Former  ?  Packs/day: 2.00  ?  Years: 20.00  ?  Pack years: 40.00  ?  Types: Cigarettes  ?  Quit date: 08/08/1978  ?  Years since quitting: 43.4  ? Smokeless tobacco: Never  ?Vaping Use  ? Vaping Use: Never used  ?Substance and Sexual Activity  ? Alcohol use: No  ? Drug use: No  ? Sexual activity: Yes  ?Other Topics Concern  ? Not on file  ?Social History Narrative  ? Not on file  ? ?Social Determinants of Health  ? ?Financial Resource Strain: Not on file  ?Food Insecurity: Not on file  ?Transportation Needs: Not on file  ?Physical Activity: Not on file  ?Stress: Not on file  ?Social Connections: Not on file  ? ?No Known Allergies ? ?Medications  ?(Not in a hospital admission) ?  ? ?Vitals  ? ?Vitals:  ? 12/27/21 1115 12/27/21 1130 12/27/21 1222 12/27/21 1300  ?BP: 140/70 (!) 155/76 (!) 168/88 (!) 170/89  ?Pulse: 85 78 74 71  ?Resp: '15 12 17 18  '$ ?Temp:      ?TempSrc:      ?SpO2: 98% 97% 98% 97%  ?Weight:      ?Height:      ?  ? ?Body mass index is 24.47 kg/m?. ? ?Physical Exam  ?General: Laying with wet cloth over eyes. Having headache, did become nauseous during exam while at rest. ?Pulmonary: Symmetric chest rise. Non-labored respiratory effort.  ?Ext: Classic RA arthritic hand changes ?Head: No pain to palpation at b/l jaw or temporal regions ?Eyes: No redness or tearfulness, no discharge.  ?GI: Soft nontender to  palpation ? ?Neurologic Examination  ?Mental status/Cognition:  ?Alert, attentive. ?Oriented to self, place, month and year ?Good attention-days of the week backwards.  Distracted because of headache, did require repeating some questions ?Memory-immediate intact, recent intact-last 3 presidents ?Speech/language:  ?Fluent, thought content appropriate.  ?Comprehension intact-able to follow 3 step commands without difficulty. ?Object naming intact, repetition intact.    ? ?Cranial Nerves: ?II: OS no VF defects. OD has difficulty with finger counting in the right visual field in the low light, but able to do it with room lights turned on. OS discs flat, OD difficult to visualize, but possibly has some papilledema nasally. Afferent pupillary defect in OD, OS intact. ?III,IV, VI: No  gaze preference or deviation. BL ptosis present (baseline per family), extra-ocular motions intact bilaterally. No nystagmus.    ?V: Facial light touch sensation normal bilaterally ?VII: Smile symmetric, no asymmetry, no nasolabial fold flattening  ?VIII: Hearing normal bilaterally ?IX,X: Cough intact, uvula rises symmetrically.  ?XI: Bilateral shoulder shrug and head turn ?XII: Midline tongue extension ?Motor: ?R  UE 5/5 LE 5/5  ?L UE 5/5 LE 5/5  ?Normal tone throughout. Normal bulk throughout. No atrophy noted ? ?Sensory: Pinprick and light touch intact throughout bilaterally. Decreased temperature sensation in hands and feet in a length dependent fashion ? ?Reflexes: ? Right Left Comments  ? Biceps (C5/6) 2 2   ?Brachioradialis (C5/6) 2 2   ? Patellar (L3/4) 3 3   ? Achilles (S1) Diminished Diminished   ? Plantar down down   ? ? ?Coordination/Complex Motor:  ?- Finger to Nose intact ?- Heel to shin intact ?- Gait: deferred ? ?Labs  ? ?CBC:  ?Recent Labs  ?Lab 12/27/21 ?1033  ?WBC 12.0*  ?NEUTROABS 8.9*  ?HGB 13.1  ?HCT 38.5*  ?MCV 91.2  ?PLT 188  ? ? ?Basic Metabolic Panel:  ?Lab Results  ?Component Value Date  ? NA 138 12/27/2021  ? K 3.6  12/27/2021  ? CO2 30 12/27/2021  ? GLUCOSE 121 (H) 12/27/2021  ? BUN 23 12/27/2021  ? CREATININE 1.67 (H) 12/27/2021  ? CALCIUM 8.8 (L) 12/27/2021  ? GFRNONAA 42 (L) 12/27/2021  ? GFRAA 42 (L) 10/31/2017

## 2021-12-27 NOTE — ED Notes (Signed)
Pt attempted to give UA sample ?

## 2021-12-28 ENCOUNTER — Encounter (HOSPITAL_COMMUNITY): Payer: Self-pay | Admitting: Internal Medicine

## 2021-12-28 ENCOUNTER — Other Ambulatory Visit: Payer: Self-pay

## 2021-12-28 ENCOUNTER — Observation Stay (HOSPITAL_COMMUNITY): Payer: Medicare Other

## 2021-12-28 DIAGNOSIS — I10 Essential (primary) hypertension: Secondary | ICD-10-CM | POA: Diagnosis not present

## 2021-12-28 DIAGNOSIS — E785 Hyperlipidemia, unspecified: Secondary | ICD-10-CM | POA: Diagnosis not present

## 2021-12-28 DIAGNOSIS — R4182 Altered mental status, unspecified: Secondary | ICD-10-CM | POA: Diagnosis not present

## 2021-12-28 DIAGNOSIS — G4459 Other complicated headache syndrome: Secondary | ICD-10-CM

## 2021-12-28 DIAGNOSIS — D352 Benign neoplasm of pituitary gland: Secondary | ICD-10-CM

## 2021-12-28 DIAGNOSIS — F419 Anxiety disorder, unspecified: Secondary | ICD-10-CM | POA: Diagnosis not present

## 2021-12-28 LAB — COMPREHENSIVE METABOLIC PANEL
ALT: 16 U/L (ref 0–44)
AST: 26 U/L (ref 15–41)
Albumin: 2.7 g/dL — ABNORMAL LOW (ref 3.5–5.0)
Alkaline Phosphatase: 51 U/L (ref 38–126)
Anion gap: 6 (ref 5–15)
BUN: 19 mg/dL (ref 8–23)
CO2: 25 mmol/L (ref 22–32)
Calcium: 7.9 mg/dL — ABNORMAL LOW (ref 8.9–10.3)
Chloride: 107 mmol/L (ref 98–111)
Creatinine, Ser: 1.53 mg/dL — ABNORMAL HIGH (ref 0.61–1.24)
GFR, Estimated: 47 mL/min — ABNORMAL LOW (ref >60)
Glucose, Bld: 85 mg/dL (ref 70–99)
Potassium: 4.4 mmol/L (ref 3.5–5.1)
Sodium: 138 mmol/L (ref 135–145)
Total Bilirubin: 0.6 mg/dL (ref 0.3–1.2)
Total Protein: 5.7 g/dL — ABNORMAL LOW (ref 6.5–8.1)

## 2021-12-28 LAB — CBC
HCT: 37.5 % — ABNORMAL LOW (ref 39.0–52.0)
Hemoglobin: 12.1 g/dL — ABNORMAL LOW (ref 13.0–17.0)
MCH: 30.2 pg (ref 26.0–34.0)
MCHC: 32.3 g/dL (ref 30.0–36.0)
MCV: 93.5 fL (ref 80.0–100.0)
Platelets: 189 10*3/uL (ref 150–400)
RBC: 4.01 MIL/uL — ABNORMAL LOW (ref 4.22–5.81)
RDW: 13.9 % (ref 11.5–15.5)
WBC: 9.2 10*3/uL (ref 4.0–10.5)
nRBC: 0 % (ref 0.0–0.2)

## 2021-12-28 LAB — PHOSPHORUS: Phosphorus: 2.8 mg/dL (ref 2.5–4.6)

## 2021-12-28 LAB — MAGNESIUM: Magnesium: 2.4 mg/dL (ref 1.7–2.4)

## 2021-12-28 MED ORDER — HYDRALAZINE HCL 20 MG/ML IJ SOLN: 10.0000 mg | Freq: Four times a day (QID) | INTRAMUSCULAR | Status: DC | PRN

## 2021-12-28 MED ORDER — GADOBUTROL 1 MMOL/ML IV SOLN
5.0000 mL | Freq: Once | INTRAVENOUS | Status: AC | PRN
  Administered 2021-12-28: 5 mL via INTRAVENOUS

## 2021-12-28 MED ORDER — ACETAMINOPHEN 500 MG PO TABS: 500.0000 mg | ORAL_TABLET | Freq: Four times a day (QID) | ORAL | 0 refills | Status: AC | PRN

## 2021-12-28 MED ORDER — LOSARTAN POTASSIUM 25 MG PO TABS
25.0000 mg | ORAL_TABLET | Freq: Every day | ORAL | Status: DC
Start: 2021-12-28 — End: 2021-12-28
  Administered 2021-12-28: 25 mg via ORAL
  Filled 2021-12-28: qty 1

## 2021-12-28 NOTE — Discharge Summary (Signed)
?Physician Discharge Summary ?  ?Patient: [name] MRN: [id number] DOB: 1946-01-29  ?Admit date:     12/27/2021  ?Discharge date: 12/28/21  ?Discharge Physician: Raiford Noble, DO  ? ?PCP: [name], MD  ? ?Recommendations at discharge:  ? ?Follow up with PCP within 1-2 weeks and repeat CBC, CMP, Mag, Phos within 1 week ?Follow up with Neurosurgery Dr. Arnoldo Morale within 1-2 weeks ?Follow up with Neurology Dr. Jaynee Eagles within 1-2 weeks for Headache management  ?Follow-up with ophthalmology in outpatient setting for further work-up and management of vision and formal visual field testing ? ?Discharge Diagnoses: ?Principal Problem: ?  Altered mental status ?Active Problems: ?  Essential hypertension ?  Hyperlipidemia ?  Headache ?  Periorbital pain, bilateral ?  Anxiety and depression ? ?Resolved Problems: ?  * No resolved hospital problems. * ? ?Hospital Course: ?HPI per Dr. Fuller Plan on 12/27/21 ?[name] is a 76 y.o. male with medical history significant of hypertension, hyperlipidemia, CKD stage III, anxiety, depression, and GERD who presents after being found to be acutely altered.  History was obtained from patient with assistance of his wife present at bedside.  Patient is states that he has had a severe constant headache for over a month, but acutely worse over the last few weeks.  Pain is behind both eyes, but usually worse on the right.  Reports sensitivity to light and sound.  He has tried using his home pain medications that he takes for arthritis, Tylenol, and Benadryl without relief in symptoms.  Associated symptoms include dizziness, nausea, and reportedly had an episode of vomiting.[name]  He denies having any significant fever, shortness of breath, cough, chest pain, or diarrhea symptoms.  Patient had not started any new medications recently.  He questioned if something was wrong with one of his corneal lens replacement and had had gone to see his eye doctor today.  However, was reported to be  disoriented unable to write his name, did not know who his wife was, or her telephone number for which EMS was called. ?  ?On admission into the emergency department patient was seen to be afebrile with blood pressure 140/70 -170/89, and all other vital signs maintained.  Patient was seen as a code stroke by neurology.  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 Labs significant for WBC 12, BUN 23, creatinine 1.67, and INR Influenza and COVID-19 screening were negative.  Alcohol level was undetectable.  Neurology recommended obtaining MRI.  Patient had been given fentanyl 50 mcg IV. ? ?**Interim History ?Neurology was consulted and gave him a migraine cocktail for his headache which abated his headache.  Ophthalmology was consulted for bilateral pseudophakia macular degeneration with headaches and is found to have a newly diagnosed pituitary mass with a bitemporal field cut and mild disc edema.  His ophthalmology exam showed no emergent other medical issues or ocular issues that may explain his pain.  Ophthalmology recommended formal visual field testing on outpatient basis for treatment of his chronic eye disease and establishment with an eye care provider.  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  Dr. Arnoldo Morale of neurosurgery felt that he did not need surgery presently and recommended observation and follow-up in outpatient setting for visual field testing and endocrinological work-up.  Neurology recommended resuming his home antihypertensives  and discontinuing scheduled Tylenol and recommend changing trazodone to as needed at home as well as limiting tramadol to 2 tablets twice daily as needed for a temporary bridge for outpatient follow-up.  Dr. Robynn Pane has set up appointment for the patient for Dr. Lavell Anchors at  [facility] neurology For migraine management.  PT OT evaluated and recommending outpatient PT and OT.  Patient is deemed stable for discharge at this time will need to follow-up with a specialist as mentioned ? ?Assessment and Plan: ? ?Acute metabolic encephalopathy, improved  ?-Patient was noted to be acutely altered while at the eye doctor today unable to complete check.   ?-Unaware of who his wife was or her telephone number.   ?-CT scan of the brain did not note any acute abnormalities.  Neurology have been formally consulted and MRI Orbits and Brain done ?-Admit to a telemetry bed ?-Neurochecks ?-Follow-up MRI of the brain with and without contrast and orbits ?-[place] neurology consultative services, we will follow-up for further recommendations ?-They felt symptoms were from headache and gave him a migraine cocktail ?-Dr. Eyvonne Mechanic felt that the headache had migrainous features but felt that the gradual progressive worsening headache over 2 months was concerning for headache secondary to intracranial mass.  His headache is improved with a combination migraine cocktail of Fioricet but would not continue to Fioricet long-term given rebound headaches.  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  ?Headacheperiorbital pain, bilateral in the setting of pituitary macroadenoma ?Patient reports constant frontal/periorbital headache over the last couple of months, and acutely worsened in the last few weeks.  Patient was given Benadryl, Phenergan, and 2 g of magnesium sulfate IV. ?-continue symptomatic treat and this is now improved ?-Follow-up MRI showed ?-Dr. Hollice Espy ophthalmology consulted,  will follow-up any further recommendations and they felt that his ophthalmologic examination showed no emergent ophthalmologic  issues or ocular issues that may explain his pain but he has a newly diagnosed pituitary mass and bitemporal field cut with mild disc edema. ?-Neurosurgery was consulted at the request of ophthalmology and they ophthalmologist recommends outpatient follow-up with established eye care provider for formal visual field testing on outpatient basis for treatment of chronic eye disease ?-Neurosurgery evaluated and reviewed his pituitary MRI and feel that his pituitary macroadenoma is not causing significant compression of the optic apparatus based on his brain MRI and recommending outpatient follow-up for visual field testing endocrinologic work-up ?-PT OT recommending outpatient follow-up ?  ?Leukocytosis ?-Acute.  WBC elevated at 12 and now improved and resolved at 9.2. ?-Follow-up urinalysis unremarkable ?-Continue monitor and trend and repeat CBC within 1 week ?  ?Essential hypertension ?-Home meds appear to include losartan and will resume at discharge ?-Follow-up MRI as above ?-Blood pressure was elevated this morning but he was given IV fluids so we have discontinued this and given as needed hydralazine as well ?-Continue monitor blood pressures per protocol last blood pressure reading 185/77 prior to him getting his medications ?  ?Chronic kidney disease stage IIIb ?-Creatinine 1.67 which appears near patient's previous baseline.  Now BUN/creatinine is 19/1.53 ?-Was getting IV fluid hydration but is now stopped ?-Continue to monitor kidney function and avoid nephrotoxic medications, contrast dyes, hypotension and dehydration and renally dose medications ?-Repeat CMP in outpatient setting ?  ?Chronic arthritis pain ?-Continue Ultracet as needed ?  ?Anxiety  and depression ?-Continue Lexapro ?  ?Hyperlipidemia ?-Continue pravastatin ?  ?History of gout ?-Continue allopurinol ? ?Consultants: Neurology, Ophthalmology, Neurosurgery ?Procedures performed: MRI Brain and Pituitary   ?Disposition:  Home with Outpatient  PT/OT ? ?Diet recommendation:  ?Cardiac diet ?DISCHARGE MEDICATION: ?Allergies as of 12/28/2021   ?No Known Allergies ?  ? ?  ?Medication List  ?  ? ?TAKE these medications   ? ?acetaminophen 500 MG tablet ?Commonly k

## 2021-12-28 NOTE — TOC Transition Note (Signed)
Transition of Care (TOC) - CM/SW Discharge Note ? ? ?Patient Details  ?Name: [name] ?MRN: [id number] ?Date of Birth: 09/13/1945 ? ?Transition of Care (TOC) CM/SW Contact:  ?Pollie Friar, RN ?Phone Number: ?12/28/2021, 3:30 PM ? ? ?Clinical Narrative:    ?Patient is discharging home with outpatient therapy through [facility]. Information on the AVS.  ?Pt has supervision at home. Wife and friends can provide needed transportation.  ?Pt manages his own medications at home and denies any issues.  ?Wife transporting home today. ? ? ?Final next level of care: OP Rehab ?Barriers to Discharge: No Barriers Identified ? ? ?Patient Goals and CMS Choice ?  ?  ?Choice offered to / list presented to : Patient ? ?Discharge Placement ?  ?           ?  ?  ?  ?  ? ?Discharge Plan and Services ?  ?  ?           ?  ?  ?  ?  ?  ?  ?  ?  ?  ?  ? ?Social Determinants of Health (SDOH) Interventions ?  ? ? ?Readmission Risk Interventions ?   ? View : No data to display.  ?  ?  ?  ? ? ? ? ? ?

## 2021-12-28 NOTE — Progress Notes (Signed)
PT Cancellation Note ? ?Patient Details ?Name: [name] ?MRN: [id number] ?DOB: 1946/07/08 ? ? ?Cancelled Treatment:    Reason Eval/Treat Not Completed: Patient at procedure or test/unavailable.  Gone to MRI.  Retry at another time. ? ? ?Ramond Dial ?12/28/2021, 10:41 AM ? ?Mee Hives, PT PhD ?Acute Rehab Dept. Number: [facility] [phone] and [place] [phone] ? ?

## 2021-12-28 NOTE — ED Notes (Signed)
A&O x 4. No complaints. To 3W room 23 via stretcher with ED tech. Accompanied by spouse.  ?

## 2021-12-28 NOTE — Plan of Care (Signed)
Pt admitted for altered mental status which has resolved. Pt has participated in all care,  plans/therapies and has completed his stated goals. All personal belongings packed and given to wife at bedside. Tele-monitoring discontinued and removed. IV at left upper forearm removed with tip intact, no s/s of infection and dressing is clean/dry. Discharge packet and information reviewed with patient/wife including follow-up appointments. Discharged to home via personal car with wife. ?

## 2021-12-28 NOTE — Evaluation (Signed)
Physical Therapy Evaluation and DISCHARGE ?Patient Details ?Name: [name] ?MRN: [id number] ?DOB: [birth year] ?Today's Date: 12/28/2021 ? ?History of Present Illness ? Pt is a 76 y/o male presenting on 4/24 with AMS.  Pt also endorses a month long headache and eye pain, sensitivity to light and sound.  CT head negative for acute abnormality. MRI with pituitary macroadenoma. Neurosurgery following.  PMH includes: anxiety and depression, DDD, HTN, RA, macular degeneration, cataract extraction.  ? ?Clinical Impression ?  ?Pt admitted with above. Pt was indep and driving a motorcycle up until 1 month ago with onset of severe headache behind the eyes. Pt with mild balance impairment and generalized weakness. Pt strongly desires to return to riding his motorcycle. Recommend outpt PT to address impaired balance and weakness to return to PTA level of function. Pt with no further acute PT needs at this time. PT SIGNING OFF. Please re-consult if needed in the future.   ?   ? ?Recommendations for follow up therapy are one component of a multi-disciplinary discharge planning process, led by the attending physician.  Recommendations may be updated based on patient status, additional functional criteria and insurance authorization. ? ?Follow Up Recommendations Outpatient PT ? ?  ?Assistance Recommended at Discharge Intermittent Supervision/Assistance  ?Patient can return home with the following ? Assist for transportation ? ?  ?Equipment Recommendations None recommended by PT  ?Recommendations for Other Services ?    ?  ?Functional Status Assessment Patient has had a recent decline in their functional status and demonstrates the ability to make significant improvements in function in a reasonable and predictable amount of time.  ? ?  ?Precautions / Restrictions Precautions ?Precautions: Fall ?Restrictions ?Weight Bearing Restrictions: No  ? ?  ? ?Mobility ? Bed Mobility ?Overal bed mobility: Modified Independent ?  ?  ?  ?  ?   ?  ?General bed mobility comments: no assist required ?  ? ?Transfers ?Overall transfer level: Modified independent ?Equipment used: None ?  ?  ?  ?  ?  ?  ?  ?General transfer comment: no difficulty ?  ? ?Ambulation/Gait ?Ambulation/Gait assistance: Min guard ?Gait Distance (Feet): 250 Feet ?Assistive device: None ?Gait Pattern/deviations: Step-through pattern ?Gait velocity: wfl ?Gait velocity interpretation: >2.62 ft/sec, indicative of community ambulatory ?  ?General Gait Details: pt with mild lateral sway especially when turning head to the L, pt states "I'm just a little off. My balance used to be so good" ? ?Stairs ?Stairs: Yes ?Stairs assistance: Min guard ?Stair Management: Two rails, Alternating pattern ?Number of Stairs: 6 ?General stair comments: no difficulty, slowed the first time ? ?Wheelchair Mobility ?  ? ?Modified Rankin (Stroke Patients Only) ?Modified Rankin (Stroke Patients Only) ?Pre-Morbid Rankin Score: No significant disability ?Modified Rankin: No significant disability ? ?  ? ?Balance   ?  ?  ?  ?  ?  ?  ?  ?  ?  ?  ?  ?  ?  ?  ?  ?Standardized Balance Assessment ?Standardized Balance Assessment : Dynamic Gait Index ?  ?Dynamic Gait Index ?Level Surface: Normal ?Change in Gait Speed: Normal ?Gait with Horizontal Head Turns: Mild Impairment ?Gait with Vertical Head Turns: Normal ?Gait and Pivot Turn: Mild Impairment ?Step Over Obstacle: Mild Impairment ?Step Around Obstacles: Normal ?Steps: Mild Impairment ?Total Score: 20 ?   ? ? ? ?Pertinent Vitals/Pain Pain Assessment ?Pain Assessment: No/denies pain  ? ? ?Home Living Family/patient expects to be discharged to:: Private residence ?Living Arrangements: Spouse/significant other ?  Available Help at Discharge: Family;Available 24 hours/day ?Type of Home: House ?Home Access: Stairs to enter ?Entrance Stairs-Rails: Left ?Entrance Stairs-Number of Steps: 4 ?  ?Home Layout: One level ?Home Equipment: Conservation officer, nature (2 wheels);Cane - single  point;Shower seat ?Additional Comments: spouse works but pts son and other family plan to assist as needed  ?  ?Prior Function Prior Level of Function : Independent/Modified Independent;Driving ?  ?  ?  ?  ?  ?  ?  ?  ?  ? ? ?Hand Dominance  ? Dominant Hand: Right ? ?  ?Extremity/Trunk Assessment  ? Upper Extremity Assessment ?Upper Extremity Assessment: Defer to OT evaluation ?  ? ?Lower Extremity Assessment ?Lower Extremity Assessment: Overall WFL for tasks assessed ?  ? ?Cervical / Trunk Assessment ?Cervical / Trunk Assessment: Normal  ?Communication  ? Communication: No difficulties  ?Cognition Arousal/Alertness: Awake/alert ?Behavior During Therapy: [facility] for tasks assessed/performed ?Overall Cognitive Status: Impaired/Different from baseline ?  ?  ?  ?  ?  ?  ?  ?  ?  ?  ?Current Attention Level: Sustained ?Memory: Decreased short-term memory ?Following Commands: Follows one step commands consistently ?  ?Awareness: Emergent ?Problem Solving: Difficulty sequencing, Slow processing ?General Comments: pt able to follow/remember multistep directional cues to navigate hallways, aware his balance is off, stopped riding his motorcycle ?  ?  ? ?  ?General Comments General comments (skin integrity, edema, etc.): vss ? ?  ?Exercises    ? ?Assessment/Plan  ?  ?PT Assessment All further PT needs can be met in the next venue of care  ?PT Problem List Decreased strength;Decreased activity tolerance;Decreased balance;Decreased mobility ? ?   ?  ?PT Treatment Interventions     ? ?PT Goals (Current goals can be found in the Care Plan section)  ?Acute Rehab PT Goals ?Patient Stated Goal: back to riding my motorcycle ?PT Goal Formulation: All assessment and education complete, DC therapy ? ?  ?Frequency   ?  ? ? ?Co-evaluation   ?  ?  ?  ?  ? ? ?  ?AM-PAC PT "6 Clicks" Mobility  ?Outcome Measure Help needed turning from your back to your side while in a flat bed without using bedrails?: None ?Help needed moving from lying on your  back to sitting on the side of a flat bed without using bedrails?: None ?Help needed moving to and from a bed to a chair (including a wheelchair)?: None ?Help needed standing up from a chair using your arms (e.g., wheelchair or bedside chair)?: None ?Help needed to walk in hospital room?: A Little ?Help needed climbing 3-5 steps with a railing? : A Little ?6 Click Score: 22 ? ?  ?End of Session Equipment Utilized During Treatment: Gait belt ?Activity Tolerance: Patient tolerated treatment well ?Patient left: with call bell/phone within reach;in bed;with family/visitor present ?Nurse Communication: Mobility status ?PT Visit Diagnosis: Muscle weakness (generalized) (M62.81);Difficulty in walking, not elsewhere classified (R26.2) ?  ? ?Time: 3818-2993 ?PT Time Calculation (min) (ACUTE ONLY): 25 min ? ? ?Charges:   PT Evaluation ?$PT Eval Moderate Complexity: 1 Mod ?PT Treatments ?$Gait Training: 8-22 mins ?  ?   ? ? ?Kittie Plater, PT, DPT ?Acute Rehabilitation Services ?Secure chat preferred ?Office #: [phone] ? ? ?Chanc Kervin M Arelia Volpe ?12/28/2021, 2:41 PM ? ?

## 2021-12-28 NOTE — ED Notes (Signed)
Breakfast order placed ?

## 2021-12-28 NOTE — Progress Notes (Addendum)
NEUROLOGY CONSULTATION PROGRESS NOTE  ? ?Date of service: December 28, 2021 ?Patient Name: [name] ?MRN:  [id number] ?DOB:  15-Mar-1946 ? ?Brief HPI  ?[name] is a 76 y.o. male with PMH Rheumatoid arthritis, HLD, HTN, pre DM2, Gout, GERD, CKD 3, macular degeneration and posterior vitreous detachment in OD, GAD, MDD, who presented to [facility] (12/27/2021) after acute confusion where he unable to fill out form at ophthalmology visit this morning.  ?  ?At the office as a receptionist noted his confusion and disorientation where he forgot how to write his name on his check, and called his wife and EMS. On the check as shown to [place], he was able to place info in correct places (correctly writing numbers) with abrupt stop in signing his name. Patient is unable to recall most events at the office, but did note that he had worsening headache, dizziness, with nausea at the office. Patient was able to safely drive himself to the office.  ?  ?Patient reported that he has had ongoing headaches for the past 2-3 months, with worsening over the past 2-3 weeks.  Reported headaches are present in the morning, last all day, with worsening around noon until he goes to bed at night.  They used to improve overnight but lately have been constant.  He denied worsening headache with increased intracranial pressure, such as with bowel movements, changing position, waking him up in middle of the night, coughing, or sneezing. Headaches are located frontally and periorbitally, with right worse than left.  He then developed nausea and recently vomiting over the past few days.  He also had associating phonophobia and photophobia.  He also reported transient blurriness and dizziness, but denied diplopia.   ?He denied auras with flashing lights or change in vision.  He denied complete loss of vision in either eyes, but reports his right eye hurts more and he has more trouble seeing on the right.  He denied weakness, speech difficulties, word  finding difficulties, numbness, clumsiness with his headaches. Headache was unrelieved with over-the-counter Tylenol. ?  ?Denied jaw claudication or difficulty/fatigue to chewing or eating.  ?  ?Reported this has never happened to him before.  He denied any changes in medications or medication dosage 2-3 months ago or 2-3 weeks ago.  Patient's ophthalmology appointment on 12/27/2021 was to see if his  corneal lens replacement surgery >10 years ago was possible cause of his headaches.  EMS arrived during patient's intake, did not receive eye exam from ophthalmology.  ?  ?Interval Hx  ? ?NAEON ? ?Wife was at bedside. ? ?Patient's headache is still present, but much improved. No N/V and less photophobia compared to day prior. No change in headache when sitting up or laying down.  ? ? ?Current Facility-Administered Medications:  ?   stroke: early stages of recovery book, , Does not apply, Once, Smith, Rondell A, MD ?  0.9 %  sodium chloride infusion, , Intravenous, Continuous, Smith, Rondell A, MD, Last Rate: 75 mL/hr at 12/28/21 0937, New Bag at 12/28/21 5631 ?  acetaminophen (TYLENOL) tablet 650 mg, 650 mg, Oral, Q4H PRN, 650 mg at 12/28/21 0927 **OR** acetaminophen (TYLENOL) 160 MG/5ML solution 650 mg, 650 mg, Per Tube, Q4H PRN **OR** acetaminophen (TYLENOL) suppository 650 mg, 650 mg, Rectal, Q4H PRN, Tamala Julian, Rondell A, MD ?  butalbital-acetaminophen-caffeine (FIORICET) 50-325-40 MG per tablet 1 tablet, 1 tablet, Oral, Q6H PRN, Norval Morton, MD, 1 tablet at 12/28/21 4970 ?  diphenhydrAMINE (BENADRYL) injection 25 mg,  25 mg, Intravenous, Q6H, 25 mg at 12/28/21 0931 **AND** prochlorperazine (COMPAZINE) injection 5 mg, 5 mg, Intravenous, Q6H, 5 mg at 12/28/21 0931 **AND** [DISCONTINUED] acetaminophen (TYLENOL) tablet 650 mg, 650 mg, Oral, Q6H, Merrily Brittle, DO, 650 mg at 12/28/21 0442 ?  escitalopram (LEXAPRO) tablet 20 mg, 20 mg, Oral, QHS, Smith, Rondell A, MD, 20 mg at 12/27/21 2251 ?  magnesium sulfate IVPB 2 g  50 mL, 2 g, Intravenous, Q12H, Last Rate: 50 mL/hr at 12/28/21 0938, 2 g at 12/28/21 0938 **AND** sodium chloride 0.9 % bolus 1,000 mL, 1,000 mL, Intravenous, Q12H, Merrily Brittle, DO, Stopped at 12/28/21 0430 ?  pravastatin (PRAVACHOL) tablet 40 mg, 40 mg, Oral, Daily, Tamala Julian, Rondell A, MD, 40 mg at 12/28/21 0933 ?  predniSONE (DELTASONE) tablet 5 mg, 5 mg, Oral, Q breakfast, Tamala Julian, Rondell A, MD, 5 mg at 12/28/21 6837 ?  traMADol-acetaminophen (ULTRACET) 37.5-325 MG per tablet 1 tablet, 1 tablet, Oral, BID PRN, Tamala Julian, Rondell A, MD ?  traZODone (DESYREL) tablet 50 mg, 50 mg, Oral, QHS PRN, Norval Morton, MD ? ?Current Outpatient Medications:  ?  acetaminophen (TYLENOL) 500 MG tablet, Take 1,000 mg by mouth every 6 (six) hours as needed for mild pain or headache., Disp: , Rfl:  ?  allopurinol (ZYLOPRIM) 300 MG tablet, Take 300 mg by mouth daily., Disp: , Rfl:  ?  escitalopram (LEXAPRO) 20 MG tablet, Take 20 mg by mouth at bedtime. , Disp: , Rfl:  ?  Etanercept (ENBREL [place]), Inject 1 Dose into the skin every Monday., Disp: , Rfl:  ?  folic acid (FOLVITE) 1 MG tablet, Take 1 mg by mouth daily., Disp: , Rfl:  ?  losartan (COZAAR) 25 MG tablet, Take 25 mg by mouth daily., Disp: , Rfl:  ?  meloxicam (MOBIC) 7.5 MG tablet, Take 7.5 mg by mouth daily as needed for pain., Disp: , Rfl:  ?  Omega-3 Fatty Acids (FISH OIL) 1200 MG CAPS, Take 6,000 mg by mouth daily. Takes 5 capsules every morning, Disp: , Rfl:  ?  pantoprazole (PROTONIX) 40 MG tablet, Take 40 mg by mouth daily., Disp: , Rfl:  ?  pravastatin (PRAVACHOL) 40 MG tablet, Take 40 mg by mouth daily., Disp: , Rfl:  ?  predniSONE (DELTASONE) 5 MG tablet, Take 5 mg by mouth daily with breakfast., Disp: , Rfl:  ?  testosterone cypionate (DEPOTESTOSTERONE CYPIONATE) 200 MG/ML injection, Inject 1 mL into the muscle every 28 (twenty-eight) days., Disp: , Rfl: 0 ?  traMADol-acetaminophen (ULTRACET) 37.5-325 MG per tablet, Take 1 tablet by mouth 2 (two) times daily as needed  for moderate pain., Disp: , Rfl:  ?  traZODone (DESYREL) 50 MG tablet, Take 50 mg by mouth at bedtime as needed for sleep., Disp: , Rfl:  ? ? ?Vitals  ? ?Vitals:  ? 12/28/21 0530 12/28/21 0545 12/28/21 0600 12/28/21 0615  ?BP: (!) 182/87   (!) 178/90  ?Pulse: (!) 58 62 63 62  ?Resp: (!) '22 19 14 18  '$ ?Temp:      ?TempSrc:      ?SpO2: 96% 94% 96% 93%  ?Weight:      ?Height:      ?  ? ?Body mass index is 24.47 kg/m?. ? ?Physical Exam  ?General: Laying comfortably in bed; in no acute distress.  ?Pulmonary: Symmetric chest rise. Non-labored respiratory effort. Room air ?Ext: Classic RA arthritic hand changes ?Head: No pain to palpation at b/l jaw or temporal regions ?Eyes: No redness or tearfulness, no discharge.  ? ?  Neurologic Examination  ?Mental status/Cognition:  ?Alert, attentive. ?Oriented to self, place, month and year ?Good attention-month of the year backwards ?Memory-immediate intact, recent intact-last 3 presidents ?Speech/language:  ?Fluent, thought content appropriate.  ?Comprehension intact-able to follow 3 step commands without difficulty. No neglect. ?Object naming intact, repetition intact.    ? ?Cranial Nerves: ?II: No VF defects OU, but has difficulty finger counting in upper left and right quadrants. OS discs flat, OD difficult to with mild nasal papilledema. No OD afferent pupil defect today, rather OU hippus.  On attending examination, did not seem to have much restriction of vision in the temporal fields though bedside evaluation is incompletely sensitive for this ?III,IV, VI: No gaze preference or deviation. BL ptosis improved from yesterday, extra-ocular motions intact bilaterally. No nystagmus.    ?V: Facial light touch sensation normal bilaterally ?VII: Smile symmetric, no asymmetry, no nasolabial fold flattening  ?VIII: Hearing normal bilaterally ?IX,X: Cough intact, uvula rises symmetrically.  ?XI: Bilateral shoulder shrug and head turn ?XII: Midline tongue extension ?Motor: ?R  UE 5/5 LE 5/5   ?L UE 5/5 LE 5/5  ?Normal tone throughout. Normal bulk throughout. No atrophy noted ?  ?Sensory: Pinprick and light touch intact throughout bilaterally. Decreased temperature sensation in hands and fe

## 2021-12-28 NOTE — Progress Notes (Signed)
OT Cancellation Note ? ?Patient Details ?Name: [name] ?MRN: [id number] ?DOB: [birth year] ? ? ?Cancelled Treatment:    Reason Eval/Treat Not Completed: Patient at procedure or test/ unavailable. Will follow and see as able.  ? ?Jolaine Artist, OT ?Acute Rehabilitation Services ?Pager [phone] ?Office [phone] ? ? ?Delight Stare ?12/28/2021, 10:06 AM ?

## 2021-12-28 NOTE — Consult Note (Signed)
Reason for Consult: Pituitary macroadenoma ?Referring Physician: Dr. Hollice Espy ? ?[name] is an 76 y.o. male.  ?HPI: Patient is a 76 year old white male with rheumatoid arthritis who presented with the ER complaining of a retro-orbital headache.  He was worked up with a brain MRI which demonstrated a pituitary macroadenoma.  He was noted to have an afferent pupillary defect and ophthalmology was consulted.  Dr. Hollice Espy recommended a neurosurgical consultation. ? ?Presently the patient admits to a headache.  He denies vision changes.  He has had no nausea, vomiting, seizures, etc. ? ?Past Medical History:  ?Diagnosis Date  ? Anxiety and depression   ? Chronic renal insufficiency, stage 3 (moderate) (HCC)   ? DDD (degenerative disc disease), lumbar   ? remote hx of fusion surgery  ? GERD (gastroesophageal reflux disease)   ? Gout   ? History of stomach ulcers 2000  ? Hyperlipidemia   ? Hypertension   ? Rheumatoid arthritis ([place])   ? Rheumatoid   ? ? ?Past Surgical History:  ?Procedure Laterality Date  ? APPENDECTOMY  1968  ? back fusion  2000  ? CHOLECYSTECTOMY N/A 06/06/2017  ? Procedure: LAPAROSCOPIC CHOLECYSTECTOMY WITH INTRAOPERATIVE CHOLANGIOGRAM;  Surgeon: [name], MD;  Location: [facility];  Service: General;  Laterality: N/A;  ? EYE SURGERY    ? Dr. [name]  lens implants  ? HERNIA REPAIR    ? umbilical  ? LUMBAR LAMINECTOMY  2000  ? TIBIA FRACTURE SURGERY Left 1998  ? with titanium rods  ? UMBILICAL HERNIA REPAIR N/A 06/06/2017  ? Procedure: UMBILICAL HERNIA REPAIR;  Surgeon: [name], MD;  Location: [facility];  Service: General;  Laterality: N/A;  ? ? ?Family History  ?Problem Relation Age of Onset  ? CVA Mother   ? Colon cancer Neg Hx   ? ? ?Social History:  reports that he quit smoking about 43 years ago. His smoking use included cigarettes. He has a 40.00 pack-year smoking history. He has never used smokeless tobacco. He reports that he does not drink alcohol and does not use drugs. ? ?Allergies:  No Known Allergies ? ?Medications: I have reviewed the patient's current medications. ?Prior to Admission: (Not in a hospital admission)  ?Scheduled: ?  stroke: early stages of recovery book   Does not apply Once  ? diphenhydrAMINE  25 mg Intravenous Q6H  ? And  ? prochlorperazine  5 mg Intravenous Q6H  ? And  ? acetaminophen  650 mg Oral Q6H  ? escitalopram  20 mg Oral QHS  ? pravastatin  40 mg Oral Daily  ? predniSONE  5 mg Oral Q breakfast  ? ?Continuous: ? sodium chloride 75 mL/hr at 12/28/21 0602  ? magnesium sulfate bolus IVPB Stopped (12/28/21 0005)  ? And  ? sodium chloride Stopped (12/28/21 0430)  ? ?AYT:[id number] **OR** acetaminophen (TYLENOL) oral liquid 160 mg/5 mL **OR** acetaminophen, butalbital-acetaminophen-caffeine, traMADol-acetaminophen, traZODone ?Anti-infectives (From admission, onward)  ? ? None  ? ?  ? ? ? ?Results for orders placed or performed during the hospital encounter of 12/27/21 (from the past 48 hour(s))  ?Ethanol     Status: None  ? Collection Time: 12/27/21 10:27 AM  ?Result Value Ref Range  ? Alcohol, Ethyl (B) <10 <10 mg/dL  ?  Comment: (NOTE) ?Lowest detectable limit for serum alcohol is 10 mg/dL. ? ?For medical purposes only. ?Performed at [facility], [place] [address] [place], [place] ?[address] ?  ?Resp Panel by RT-PCR (Flu A&B, Covid) Nasopharyngeal Swab  Status: None  ? Collection Time: 12/27/21 10:33 AM  ? Specimen: Nasopharyngeal Swab; Nasopharyngeal(NP) swabs in vial transport medium  ?Result Value Ref Range  ? SARS Coronavirus 2 by RT PCR NEGATIVE NEGATIVE  ?  Comment: (NOTE) ?SARS-CoV-2 target nucleic acids are NOT DETECTED. ? ?The SARS-CoV-2 RNA is generally detectable in upper respiratory ?specimens during the acute phase of infection. The lowest ?concentration of SARS-CoV-2 viral copies this assay can detect is ?138 copies/mL. A negative result does not preclude SARS-Cov-2 ?infection and should not be used as the sole basis for treatment or ?other  patient management decisions. A negative result may occur with  ?improper specimen collection/handling, submission of specimen other ?than nasopharyngeal swab, presence of viral mutation(s) within the ?areas targeted by this assay, and inadequate number of viral ?copies(<138 copies/mL). A negative result must be combined with ?clinical observations, patient history, and epidemiological ?information. The expected result is Negative. ? ?Fact Sheet for Patients:  ?EntrepreneurPulse.com.au ? ?Fact Sheet for Healthcare Providers:  ?IncredibleEmployment.be ? ?This test is no t yet approved or cleared by the [place] FDA and  ?has been authorized for detection and/or diagnosis of SARS-CoV-2 by ?FDA under an Emergency Use Authorization (EUA). This EUA will remain  ?in effect (meaning this test can be used) for the duration of the ?COVID-19 declaration under Section 564(b)(1) of the Act, 21 ?U.S.C.section 360bbb-3(b)(1), unless the authorization is terminated  ?or revoked sooner.  ? ? ?  ? Influenza A by PCR NEGATIVE NEGATIVE  ? Influenza B by PCR NEGATIVE NEGATIVE  ?  Comment: (NOTE) ?The Xpert Xpress SARS-CoV-2/FLU/RSV plus assay is intended as an aid ?in the diagnosis of influenza from Nasopharyngeal swab specimens and ?should not be used as a sole basis for treatment. Nasal washings and ?aspirates are unacceptable for Xpert Xpress SARS-CoV-2/FLU/RSV ?testing. ? ?Fact Sheet for Patients: ?EntrepreneurPulse.com.au ? ?Fact Sheet for Healthcare Providers: ?IncredibleEmployment.be ? ?This test is not yet approved or cleared by the [place] FDA and ?has been authorized for detection and/or diagnosis of SARS-CoV-2 by ?FDA under an Emergency Use Authorization (EUA). This EUA will remain ?in effect (meaning this test can be used) for the duration of the ?COVID-19 declaration under Section 564(b)(1) of the Act, 21 U.S.C. ?section 360bbb-3(b)(1), unless  the authorization is terminated or ?revoked. ? ?Performed at [facility], [place] [address], [place], [place] ?[address] ?  ?Protime-INR     Status: None  ? Collection Time: 12/27/21 10:33 AM  ?Result Value Ref Range  ? Prothrombin Time 13.3 11.4 - 15.2 seconds  ? INR 1.0 0.8 - 1.2  ?  Comment: (NOTE) ?INR goal varies based on device and disease states. ?Performed at [facility], [place] [address], [place], [place] ?[address] ?  ?APTT     Status: None  ? Collection Time: 12/27/21 10:33 AM  ?Result Value Ref Range  ? aPTT 26 24 - 36 seconds  ?  Comment: Performed at [facility], [place] [address], [place], [place] [address]  ?CBC     Status: Abnormal  ? Collection Time: 12/27/21 10:33 AM  ?Result Value Ref Range  ? WBC 12.0 (H) 4.0 - 10.5 K/uL  ? RBC 4.22 4.22 - 5.81 MIL/uL  ? Hemoglobin 13.1 13.0 - 17.0 g/dL  ? HCT 38.5 (L) 39.0 - 52.0 %  ? MCV 91.2 80.0 - 100.0 fL  ? MCH 31.0 26.0 - 34.0 pg  ? MCHC 34.0 30.0 - 36.0 g/dL  ? RDW 14.0 11.5 -  15.5 %  ? Platelets 188 150 - 400 K/uL  ? nRBC 0.0 0.0 - 0.2 %  ?  Comment: Performed at [facility], [place] [address], [place], [place] [address]  ?Differential     Status: Abnormal  ? Collection Time: 12/27/21 10:33 AM  ?Result Value Ref Range  ? Neutrophils Relative % 74 %  ? Neutro Abs 8.9 (H) 1.7 - 7.7 K/uL  ? Lymphocytes Relative 16 %  ? Lymphs Abs 1.9 0.7 - 4.0 K/uL  ? Monocytes Relative 9 %  ? Monocytes Absolute 1.0 0.1 - 1.0 K/uL  ? Eosinophils Relative 1 %  ? Eosinophils Absolute 0.1 0.0 - 0.5 K/uL  ? Basophils Relative 0 %  ? Basophils Absolute 0.0 0.0 - 0.1 K/uL  ? Immature Granulocytes 0 %  ? Abs Immature Granulocytes 0.04 0.00 - 0.07 K/uL  ?  Comment: Performed at [facility], [place] [address] [place], [place] [address]  ?Comprehensive metabolic panel     Status: Abnormal  ? Collection Time: 12/27/21 10:33 AM  ?Result Value Ref Range  ? Sodium 138 135 - 145 mmol/L  ? Potassium 3.6 3.5 - 5.1 mmol/L  ? Chloride 101 98 - 111 mmol/L  ? CO2 30 22 -  32 mmol/L  ? Glucose, Bld 121 (H) 70 - 99 mg/dL  ?  Comment: Glucose reference range applies only to samples taken after fasting for at least 8 hours.  ? BUN 23 8 - 23 mg/dL  ? Creatinine, Ser 1.67 (H) 0.6

## 2021-12-28 NOTE — Evaluation (Signed)
Speech Language Pathology Evaluation ?Patient Details ?Name: HARRINGTON JOBE ?MRN: [id number] ?DOB: 1946/05/09 ?Today's Date: 12/28/2021 ?Time: 0865-7846 ?SLP Time Calculation (min) (ACUTE ONLY): 22 min ? ?Problem List:  ?Patient Active Problem List  ? Diagnosis Date Noted  ? Altered mental status 12/27/2021  ? Headache 12/27/2021  ? Periorbital pain, bilateral 12/27/2021  ? Anxiety and depression 12/27/2021  ? Chronic gouty arthritis 04/23/2021  ? Chronic pain 04/23/2021  ? Essential hypertension 04/23/2021  ? Fatigue 04/23/2021  ? Hyperlipidemia 04/23/2021  ? Insomnia 04/23/2021  ? Rheumatoid arthritis ([place]) 04/23/2021  ? Intermediate stage nonexudative age-related macular degeneration of both eyes 07/22/2020  ? Posterior vitreous detachment of right eye 07/22/2020  ? Vitreomacular adhesion of left eye 07/22/2020  ? Cholelithiasis with chronic cholecystitis 06/05/2017  ? Umbilical hernia 96/29/5284  ? Cholelithiasis 05/09/2017  ? GERD 10/15/2010  ? NAUSEA WITH VOMITING 10/15/2010  ? COLONIC POLYPS 11/25/2008  ? GASTRIC ULCER 11/25/2008  ? DUODENAL ULCER 11/25/2008  ? DIVERTICULOSIS, COLON 11/25/2008  ? ARTHRITIS, RHEUMATOID 11/25/2008  ? NAUSEA 11/25/2008  ? ABDOMINAL PAIN-EPIGASTRIC 11/25/2008  ? ABDOMINAL PAIN -GENERALIZED 11/25/2008  ? ?Past Medical History:  ?Past Medical History:  ?Diagnosis Date  ? Anxiety and depression   ? Chronic renal insufficiency, stage 3 (moderate) (HCC)   ? DDD (degenerative disc disease), lumbar   ? remote hx of fusion surgery  ? GERD (gastroesophageal reflux disease)   ? Gout   ? History of stomach ulcers 2000  ? Hyperlipidemia   ? Hypertension   ? Rheumatoid arthritis ([place])   ? Rheumatoid   ? ?Past Surgical History:  ?Past Surgical History:  ?Procedure Laterality Date  ? APPENDECTOMY  1968  ? back fusion  2000  ? CHOLECYSTECTOMY N/A 06/06/2017  ? Procedure: LAPAROSCOPIC CHOLECYSTECTOMY WITH INTRAOPERATIVE CHOLANGIOGRAM;  Surgeon: [name], MD;  Location: [facility];  Service: General;   Laterality: N/A;  ? EYE SURGERY    ? Dr. [name]  lens implants  ? HERNIA REPAIR    ? umbilical  ? LUMBAR LAMINECTOMY  2000  ? TIBIA FRACTURE SURGERY Left 1998  ? with titanium rods  ? UMBILICAL HERNIA REPAIR N/A 06/06/2017  ? Procedure: UMBILICAL HERNIA REPAIR;  Surgeon: [name], MD;  Location: [facility];  Service: General;  Laterality: N/A;  ? ?HPI:  ?Pt is a 76 y/o male presenting on 4/24 with AMS.  Pt also endorses a month long headache and eye pain, sensitivity to light and sound.  CT head negative for acute abnormality. MRI with pituitary macroadenoma. Neurosurgery following.  PMH includes: anxiety and depression, DDD, HTN, RA, macular degeneration, cataract extraction.  ? ?Assessment / Plan / Recommendation ?Clinical Impression ? Mr. Bischof participated in cognitive-linguistic assessment with his wife at bedside. He is independent at baseline, drives his motorcycle "everywhere," mows the grass, does basic housework, enjoys spending time with his 16 year old grandson.  He acknowledged some changes in memory and presented with difficulty with immediate as well as delayed word recall.  Had mild difficulty recalling instructions, following multistep commands, and selectively attending to tasks.  He performed a clock-drawing task without difficulty.  No formal SLP f/u recommended at this time - he will benefit from cognitive treatment in the context of OP OT.  Recommend initial supervision upon D/c with his wife. They verbalize understanding. ?   ?SLP Assessment ? SLP Recommendation/Assessment: Patient does not need any further [facility] ?SLP Visit Diagnosis: Cognitive communication deficit (R41.841)  ?  ?Recommendations for follow up  therapy are one component of a multi-disciplinary discharge planning process, led by the attending physician.  Recommendations may be updated based on patient status, additional functional criteria and insurance authorization. ?   ?Follow Up  Recommendations ? No SLP follow up  ?  ?Assistance Recommended at Discharge ?  Frequent supervision  ?    ?    ?  ?  ?   ?SLP Evaluation ?Cognition ? Overall Cognitive Status: Within Functional Limits for tasks assessed ?Arousal/Alertness: Awake/alert ?Orientation Level: Oriented X4 ?Attention: Selective ?Selective Attention: Impaired ?Selective Attention Impairment: Verbal basic ?Memory: Impaired ?Memory Impairment: Storage deficit;Retrieval deficit ?Problem Solving: Impaired ?Problem Solving Impairment: Verbal basic  ?  ?   ?Comprehension ? Auditory Comprehension ?Overall Auditory Comprehension: Appears within functional limits for tasks assessed  ?  ?Expression Expression ?Primary Mode of Expression: Verbal ?Verbal Expression ?Overall Verbal Expression: Appears within functional limits for tasks assessed ?Written Expression ?Dominant Hand: Right   ?Oral / Motor ? Motor Speech ?Overall Motor Speech: Appears within functional limits for tasks assessed   ?        ? ?Juan Quam Laurice ?12/28/2021, 3:17 PM ?Estill Bamberg L. Harshil Cavallaro, MA CCC/SLP ?Acute Rehabilitation Services ?Office number [phone] ?Pager [phone] ? ?

## 2021-12-28 NOTE — ED Notes (Signed)
Patient transported to MRI 

## 2021-12-28 NOTE — Evaluation (Signed)
Occupational Therapy Evaluation ?Patient Details ?Name: [name] ?MRN: [id number] ?DOB: 06-23-1946 ?Today's Date: 12/28/2021 ? ? ?History of Present Illness Pt is a 76 y/o male presenting on 4/24 with AMS.  Pt also endorses a month long headache and eye pain, sensitivity to light and sound.  CT head negative for acute abnormality. MRI with pituitary macroadenoma. Neurosurgery following.  PMH includes: anxiety and depression, DDD, HTN, RA, macular degeneration, cataract extraction.  ? ?Clinical Impression ?  ?PTA patient reports independent and driving. Admitted for above and presents with problem list below, including impaired cognition, decreased activity tolerance, impaired balance.  Patient currently requires min guard for transfers and mobility, mild unsteadiness noted with pt reaching out for UE support dynamically.  Requires up to min guard for ADLs.  Demonstrating difficulty with STM, sequencing, problem solving and attention; patients spouse reports significantly improved cognition from yesterday and baseline decreased STM but cognition is not back to normal yet. Patient and spouse educated on recommendations for initial 24/7 support (spouse reports family can assist while she is working), assist with meds and grilling, and no driving at this time- they are agreeable.  Recommend continued OT acutely and after dc at OP OT level to address higher level cognition and IADLs.  ?   ? ?Recommendations for follow up therapy are one component of a multi-disciplinary discharge planning process, led by the attending physician.  Recommendations may be updated based on patient status, additional functional criteria and insurance authorization.  ? ?Follow Up Recommendations ? Outpatient OT  ?  ?Assistance Recommended at Discharge Frequent or constant Supervision/Assistance  ?Patient can return home with the following A little help with walking and/or transfers;A little help with bathing/dressing/bathroom;Assistance  with cooking/housework;Direct supervision/assist for medications management;Direct supervision/assist for financial management;Assist for transportation;Help with stairs or ramp for entrance ? ?  ?Functional Status Assessment ? Patient has had a recent decline in their functional status and demonstrates the ability to make significant improvements in function in a reasonable and predictable amount of time.  ?Equipment Recommendations ? None recommended by OT  ?  ?Recommendations for Other Services PT consult;Speech consult ? ? ?  ?Precautions / Restrictions Precautions ?Precautions: Fall ?Restrictions ?Weight Bearing Restrictions: No  ? ?  ? ?Mobility Bed Mobility ?Overal bed mobility: Modified Independent ?  ?  ?  ?  ?  ?  ?General bed mobility comments: no assist required ?  ? ?Transfers ?  ?  ?  ?  ?  ?  ?  ?  ?  ?  ?  ? ?  ?Balance Overall balance assessment: Mild deficits observed, not formally tested ?  ?  ?  ?  ?  ?  ?  ?  ?  ?  ?  ?  ?  ?  ?  ?  ?  ?  ?   ? ?ADL either performed or assessed with clinical judgement  ? ?ADL Overall ADL's : Needs assistance/impaired ?  ?  ?Grooming: Min guard;Standing ?  ?  ?  ?  ?  ?Upper Body Dressing : Set up;Sitting ?  ?Lower Body Dressing: Min guard;Sit to/from stand ?  ?Toilet Transfer: Min guard;Ambulation ?  ?Toileting- Clothing Manipulation and Hygiene: Supervision/safety;Sit to/from stand ?  ?  ?  ?Functional mobility during ADLs: Min guard;Cueing for safety ?General ADL Comments: pt limited by impaired balance, cognition  ? ? ? ?Vision Baseline Vision/History: 0 No visual deficits ?Ability to See in Adequate Light: 0 Adequate ?Patient Visual Report: [id number]  of vision (intermittently) ?Vision Assessment?: Yes ?Eye Alignment: Within Functional Limits ?Ocular Range of Motion: Within Functional Limits ?Alignment/Gaze Preference: Within Defined Limits ?Tracking/Visual Pursuits: Able to track stimulus in all quads without difficulty ?Visual Fields: No apparent deficits  ?    ?Perception   ?  ?Praxis   ?  ? ?Pertinent Vitals/Pain Pain Assessment ?Pain Assessment: No/denies pain  ? ? ? ?Hand Dominance Right ?  ?Extremity/Trunk Assessment Upper Extremity Assessment ?Upper Extremity Assessment: Overall WFL for tasks assessed ?  ?Lower Extremity Assessment ?Lower Extremity Assessment: Defer to PT evaluation ?  ?  ?  ?Communication Communication ?Communication: No difficulties ?  ?Cognition Arousal/Alertness: Awake/alert ?Behavior During Therapy: [facility] for tasks assessed/performed ?Overall Cognitive Status: Impaired/Different from baseline ?Area of Impairment: Memory, Attention, Awareness, Problem solving, Safety/judgement, Following commands ?  ?  ?  ?  ?  ?  ?  ?  ?  ?Current Attention Level: Sustained ?Memory: Decreased short-term memory ?Following Commands: Follows one step commands consistently, Follows one step commands with increased time, Follows multi-step commands inconsistently ?Safety/Judgement: Decreased awareness of deficits, Decreased awareness of safety ?Awareness: Emergent ?Problem Solving: Slow processing, Difficulty sequencing, Requires verbal cues ?General Comments: pt with difficulty sequencing and dual tasking, decreased STM and cueing for safety.  fair awareness of deficits, spouse reports STM deficits recently but not quite back to baseline ?  ?  ?General Comments  spouse present and supportive, discussed recommendations for supervision for meds and grilling; no driving at this time ? ?  ?Exercises   ?  ?Shoulder Instructions    ? ? ?Home Living Family/patient expects to be discharged to:: Private residence ?Living Arrangements: Spouse/significant other ?Available Help at Discharge: Family;Available 24 hours/day ?Type of Home: House ?Home Access: Stairs to enter ?Entrance Stairs-Number of Steps: 4 ?Entrance Stairs-Rails: Left ?Home Layout: One level ?  ?  ?Bathroom Shower/Tub: Tub/shower unit;Walk-in shower ?  ?Bathroom Toilet: Standard ?  ?  ?Home Equipment: Chartered certified accountant (2 wheels);Cane - single point;Shower seat ?  ?Additional Comments: spouse works but pts son and other family plan to assist as needed ?  ? ?  ?Prior Functioning/Environment Prior Level of Function : Independent/Modified Independent;Driving ?  ?  ?  ?  ?  ?  ?  ?  ?  ? ?  ?  ?OT Problem List: Decreased activity tolerance;Impaired balance (sitting and/or standing);Decreased cognition;Decreased safety awareness;Decreased knowledge of precautions ?  ?   ?OT Treatment/Interventions: Self-care/ADL training;DME and/or AE instruction;Therapeutic activities;Cognitive remediation/compensation;Patient/family education;Balance training  ?  ?OT Goals(Current goals can be found in the care plan section) Acute Rehab OT Goals ?Patient Stated Goal: home ?OT Goal Formulation: With patient ?Time For Goal Achievement: 01/11/22 ?Potential to Achieve Goals: Good  ?OT Frequency: Min 2X/week ?  ? ?Co-evaluation   ?  ?  ?  ?  ? ?  ?AM-PAC OT "6 Clicks" Daily Activity     ?Outcome Measure Help from another person eating meals?: None ?Help from another person taking care of personal grooming?: A Little ?Help from another person toileting, which includes using toliet, bedpan, or urinal?: A Little ?Help from another person bathing (including washing, rinsing, drying)?: A Little ?Help from another person to put on and taking off regular upper body clothing?: A Little ?Help from another person to put on and taking off regular lower body clothing?: A Little ?6 Click Score: 19 ?  ?End of Session Nurse Communication: Mobility status ? ?Activity Tolerance: Patient tolerated treatment well ?Patient left: in bed;with call bell/phone  within reach;with bed alarm set;with family/visitor present ? ?OT Visit Diagnosis: Other abnormalities of gait and mobility (R26.89);Other symptoms and signs involving cognitive function  ?              ?Time: 1325-1350 ?OT Time Calculation (min): 25 min ?Charges:  OT General Charges ?$OT Visit: 1 Visit ?OT  Evaluation ?$OT Eval Moderate Complexity: 1 Mod ?OT Treatments ?$Self Care/Home Management : 8-22 mins ? ?Jolaine Artist, OT ?Acute Rehabilitation Services ?Pager [phone] ?Office [phone] ? ? ?Anner Crete Bu

## 2022-01-02 ENCOUNTER — Encounter: Payer: Self-pay | Admitting: Neurology

## 2022-01-02 NOTE — Progress Notes (Signed)
Patient called on call for constant headache, chart reviewed, he has pituitary macroadenoma, pending neurosurgery appt, stated appt with [place] on May 30. ? ?Wants to be seen soon ? ?Advise his take more frequent Tylenol, max daily dose '3000mg'$  in a day

## 2022-01-03 ENCOUNTER — Ambulatory Visit: Payer: Medicare Other | Admitting: Neurology

## 2022-01-03 ENCOUNTER — Telehealth: Payer: Self-pay | Admitting: Neurology

## 2022-01-03 ENCOUNTER — Encounter: Payer: Self-pay | Admitting: Neurology

## 2022-01-03 ENCOUNTER — Ambulatory Visit (HOSPITAL_COMMUNITY): Payer: Medicare Other | Attending: Internal Medicine

## 2022-01-03 VITALS — BP 158/92 | HR 79 | Ht 68.0 in | Wt 153.0 lb

## 2022-01-03 DIAGNOSIS — G43901 Migraine, unspecified, not intractable, with status migrainosus: Secondary | ICD-10-CM

## 2022-01-03 DIAGNOSIS — R519 Headache, unspecified: Secondary | ICD-10-CM | POA: Diagnosis not present

## 2022-01-03 DIAGNOSIS — G441 Vascular headache, not elsewhere classified: Secondary | ICD-10-CM

## 2022-01-03 DIAGNOSIS — H5713 Ocular pain, bilateral: Secondary | ICD-10-CM

## 2022-01-03 DIAGNOSIS — H5340 Unspecified visual field defects: Secondary | ICD-10-CM

## 2022-01-03 DIAGNOSIS — D352 Benign neoplasm of pituitary gland: Secondary | ICD-10-CM | POA: Diagnosis not present

## 2022-01-03 MED ORDER — PROPRANOLOL HCL ER 60 MG PO CP24: 60.0000 mg | ORAL_CAPSULE | Freq: Every day | ORAL | 1 refills | Status: DC

## 2022-01-03 MED ORDER — OXYCODONE-ACETAMINOPHEN 10-325 MG PO TABS: 1.0000 | ORAL_TABLET | Freq: Four times a day (QID) | ORAL | 0 refills | Status: DC | PRN

## 2022-01-03 NOTE — Telephone Encounter (Addendum)
Spoke to patient made a sooner appointment  for   May 1st 130pm (today) pt states he is having increased migraines and headaches. Informed patient if he is experiencing  increased pain please call 911 pt declined wanted to keep appointment for today. Pt states he is worried about driving offered pt book a ride pt declined and states he will drive . Pt thanked me for calling  ?

## 2022-01-03 NOTE — Telephone Encounter (Signed)
Appt with Dr. Jaynee Eagles on May 1 ?

## 2022-01-03 NOTE — Telephone Encounter (Signed)
Pt called in requesting a sooner appt. Pt states he cannot stand the head pain and eye pain any longer.  ?States none of his medication is helping and would like anything to help.  ?Would like a call back.  ? ?

## 2022-01-03 NOTE — Patient Instructions (Addendum)
Stop tramadol ?Give percocet ?Start propranolol ?Endocrinology asap ?Bloodwork today ?See your eye doctor asap ?F/u neurosurgery ?See you in several weeks ? ?Meds ordered this encounter  ?Medications  ? propranolol ER (INDERAL LA) 60 MG 24 hr capsule  ?  Sig: Take 1 capsule (60 mg total) by mouth daily.  ?  Dispense:  30 capsule  ?  Refill:  1  ? oxyCODONE-acetaminophen (PERCOCET) 10-325 MG tablet  ?  Sig: Take 1 tablet by mouth every 6 (six) hours as needed for pain.  ?  Dispense:  60 tablet  ?  Refill:  0  ? ?Orders Placed This Encounter  ?Procedures  ? Prolactin  ? Luteinizing Hormone  ? Follicle Stimulating Hormone  ? Insulin-Like Growth Factor  ? Cortisol, Baseline  ? TSH  ? T4, Free  ? PTH, intact and calcium  ? Testosterone  ? Ambulatory referral to Neurosurgery  ? Ambulatory referral to Endocrinology  ? Ambulatory referral to Endocrinology  ? ? ?Propranolol Extended-Release Capsules ?What is this medication? ?PROPRANOLOL (proe PRAN oh lole) treats many conditions such as high blood pressure and heart disease. It may also be used to prevent chest pain (angina). It works by lowering your blood pressure and heart rate, making it easier for your heart to pump blood to the rest of your body. It can also be used to prevent migraines and headaches. It works by relaxing the blood vessels in the brain that cause migraines. It belongs to a group of medications called beta blockers. ?This medicine may be used for other purposes; ask your health care provider or pharmacist if you have questions. ?COMMON BRAND NAME(S): Inderal LA, Inderal XL, InnoPran XL ?What should I tell my care team before I take this medication? ?They need to know if you have any of these conditions: ?Circulation problems, or blood vessel disease ?Diabetes ?History of heart attack or heart disease, vasospastic angina ?Kidney disease ?Liver disease ?Lung or breathing disease, like asthma or emphysema ?Pheochromocytoma ?Slow heart rate ?Thyroid  disease ?An unusual or allergic reaction to propranolol, other beta-blockers, medications, foods, dyes, or preservatives ?Pregnant or trying to get pregnant ?Breast-feeding ?How should I use this medication? ?Take this medication by mouth. Take it as directed on the prescription label at the same time every day. Do not cut, crush or chew this medication. Swallow the capsules whole. You can take it with or without food. If it upsets your stomach, take it with food. Keep taking it unless your care team tells you to stop. ?Talk to your care team about the use of this medication in children. Special care may be needed. ?Overdosage: If you think you have taken too much of this medicine contact a poison control center or emergency room at once. ?NOTE: This medicine is only for you. Do not share this medicine with others. ?What if I miss a dose? ?If you miss a dose, take it as soon as you can. If it is almost time for your next dose, take only that dose. Do not take double or extra doses. ?What may interact with this medication? ?Do not take this medication with any of the following: ?Feverfew ?Phenothiazines like chlorpromazine, mesoridazine, prochlorperazine, thioridazine ?This medication may also interact with the following: ?Aluminum hydroxide gel ?Antipyrine ?Antiviral medications for HIV or AIDS ?Barbiturates like phenobarbital ?Certain medications for blood pressure, heart disease, irregular heart beat ?Cimetidine ?Ciprofloxacin ?Diazepam ?Fluconazole ?Haloperidol ?Isoniazid ?Medications for cholesterol like cholestyramine or colestipol ?Medications for mental depression ?Medications for migraine headache like  almotriptan, eletriptan, frovatriptan, naratriptan, rizatriptan, sumatriptan, zolmitriptan ?NSAIDs, medications for pain and inflammation, like ibuprofen or naproxen ?Phenytoin ?Rifampin ?Teniposide ?Theophylline ?Thyroid medications ?Tolbutamide ?Warfarin ?Zileuton ?This list may not describe all possible  interactions. Give your health care provider a list of all the medicines, herbs, non-prescription drugs, or dietary supplements you use. Also tell them if you smoke, drink alcohol, or use illegal drugs. Some items may interact with your medicine. ?What should I watch for while using this medication? ?Visit your care team for regular checks on your progress. Check your blood pressure as directed. Ask your care team what your blood pressure should be. Also, find out when you should contact him or her. ?Do not treat yourself for coughs, colds, or pain while you are using this medication without asking your care team for advice. Some medications may increase your blood pressure. ?You may get drowsy or dizzy. Do not drive, use machinery, or do anything that needs mental alertness until you know how this medication affects you. Do not stand up or sit up quickly, especially if you are an older patient. This reduces the risk of dizzy or fainting spells. Alcohol may interfere with the effect of this medication. Avoid alcoholic drinks. ?This medication may increase blood sugar. Ask your care team if changes in diet or medications are needed if you have diabetes. ?Do not suddenly stop taking this medication. You may develop severe heart-related effects. Your care team will tell you how much medication to take. If your care team wants you to stop the medication, the dose may be slowly lowered over time to avoid any side effects. ?What side effects may I notice from receiving this medication? ?Side effects that you should report to your care team as soon as possible: ?Allergic reactions--skin rash, itching, hives, swelling of the face, lips, tongue, or throat ?Heart failure--shortness of breath, swelling of the ankles, feet, or hands, sudden weight gain, unusual weakness or fatigue ?Low blood pressure--dizziness, feeling faint or lightheaded, blurry vision ?Raynaud's--cool, numb, or painful fingers or toes that may change color  from pale, to blue, to red ?Redness, blistering, peeling, or loosening of the skin, including inside the mouth ?Slow heartbeat--dizziness, feeling faint or lightheaded, confusion, trouble breathing, unusual weakness or fatigue ?Worsening mood, feelings of depression ?Side effects that usually do not require medical attention (report to your care team if they continue or are bothersome): ?Change in sex drive or performance ?Diarrhea ?Dizziness ?Fatigue ?Headache ?This list may not describe all possible side effects. Call your doctor for medical advice about side effects. You may report side effects to FDA at 1-800-FDA-1088. ?Where should I keep my medication? ?Keep out of the reach of children and pets. ?Store at room temperature between 15 and 30 degrees C (59 and 86 degrees F). Protect from light and moisture. Keep the container tightly closed. Avoid exposure to extreme heat. Do not freeze. Throw away any unused medication after the expiration date. ?NOTE: This sheet is a summary. It may not cover all possible information. If you have questions about this medicine, talk to your doctor, pharmacist, or health care provider. ?? 2023 Elsevier/Gold Standard (2021-07-23 00:00:00) ?Acetaminophen; Oxycodone Tablets ?What is this medication? ?ACETAMINOPHEN; OXYCODONE (a set a MEE noe fen; ox i KOE done) treats moderate pain. It is prescribed when other pain medications have not worked or cannot be tolerated. It works by blocking pain signals in the brain. This medication is a combination of acetaminophen and an opioid. ?This medicine may be used for  other purposes; ask your health care provider or pharmacist if you have questions. ?COMMON BRAND NAME(S): Endocet, Magnacet, Nalocet, Narvox, Percocet, Perloxx, Primalev, Primlev, Prolate, Roxicet, Tylox, Xolox ?What should I tell my care team before I take this medication? ?They need to know if you have any of these conditions: ?Brain tumor ?Drug abuse or addiction ?Head  injury ?Heart disease ?If you often drink alcohol ?Kidney disease ?Liver disease ?Low adrenal gland function ?Lung disease, asthma, or breathing problem ?Seizures ?Stomach or intestine problems ?Taken an MAOI like M

## 2022-01-03 NOTE — Progress Notes (Signed)
?[facility] ? ? ? ?Provider:  Dr Jaynee Eagles ?Requesting Provider: [name], MD ?Primary Care Provider:  Deon Pilling, NP ? 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 ?HPI:  [name] is a 76 y.o. male here as requested by [name], MD for follow-up after being seen in the emergency room.  He has a past medical history of hypertension, hyperlipidemia, CKD stage III, anxiety depression and GERD.  He presented recently after being found to be acutely altered, having constant headache for over a month but acutely worse over the several last week, pain behind the eyes but usually worse on the right, sensitivity to light and sound, home pain medication such as Tylenol Benadryl without relief, associated symptoms dizziness nausea and reportedly an episode of vomiting.  No other significant symptoms such as fever shortness of breath cough chest pain or diarrhea.  He went to see his eye doctor who sent him to the emergency room as he was disoriented and unable to write his name, EMS was called.  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  Neurology was consulted and given a migraine cocktail which abated his headache.  Ophthalmology was consulted and he was found to have a newly diagnosed pituitary mass with bitemporal field cut mild disc edema.  His ophthalmology exam showed no emergent or otherwise medical issues or ocular issues to explain his pain.  Ophthalmology recommended formal visual field testing on outpatient basis and establishment with neurosurgery ? ?Patient reports headaches ongoing for the past 2 to 3 months in the setting of pituitary macroadenoma, present in the morning lasting all day worsening around noon, no worsening headaches with increased intracranial  pressure such as bowel movements, changing positions waking up in the middle the night coughing or sneezing, they are located frontally and periorbitally with right worse than left, nausea with vomiting, associated photophobia and phonophobia, transient blurriness and dizziness but no diplopia. Today it is throbbng around the eyes, nausea, vomiting, blurry vision, no drainage from nipples, migraine cocktail stopped it, came back the next day. He takes 2 aspirin and 2 pain pills this morning. Son is here and provides information. Pulsating/pounding/light sensitivity. Blurry vision. Pain is severe. Better when laying down.  ?  ? ?Reviewed notes, labs and imaging from outside physicians, which showed: ?MRI HEAD FINDINGS ?  ?Brain: No acute infarction, hemorrhage, hydrocephalus, or ?extra-axial fluid collection. Approximately 1.3 cm pituitary mass ?with suprasellar extension. This mass comes in close proximity to ?the optic chiasm. Moderate patchy T2/FLAIR hyperintensities in the ?white matter, nonspecific but compatible with chronic microvascular ?ischemic disease. Cerebral atrophy. ?  ?Vascular: Major arterial flow voids are maintained skull base. ?  ?Skull and upper cervical spine: Normal marrow signal. ?  ?Other: No mastoid effusions. ?  ?MRI ORBITS FINDINGS ?  ?Orbits: No orbital mass or evidence of inflammation. Normal ?appearance of the globes, optic nerve-sheath complexes, extraocular ?muscles, and lacrimal glands. Prominent retro bulbar fat ?bilaterally. ?  ?Visualized sinuses: Mild-to-moderate paranasal sinus mucosal ?thickening. ?  ?Soft tissues: Normal. ?  ?IMPRESSION: ?Due to patient pain, postcontrast imaging was not performed and ?study was terminated early. Within this limitation: ?  ?1. Approximately 1.3 cm pituitary mass with suprasellar extension. ?This mass comes in close proximity to the optic chiasm. Recommend ?dedicated  pituitary protocol MRI with contrast for further ?characterization. ?2.  Otherwise, no evidence of acute intracranial or orbital ?abnormality. ?3. 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 ?  ? ?Review of records, medications tried that can be used in migraine/headache management include: Tylenol, Decadron, Fioricet, Lexapro, losartan, meloxicam magnesium, Zofran, prednisone, Compazine, nortriptyline/amitriptyline contraindicated due to age and risk of altered mental status due to tricyclic antidepressants, compazine, phenergan, trazodone ? ?Review of Systems: ?Patient complains of symptoms per HPI as well as the following symptoms severe eye pain and headache. Pertinent negatives and positives per HPI. All others negative. ? ? ?Social History  ? ?Socioeconomic History  ? Marital status: Married  ?  Spouse name: Not on file  ? Number of children: 1  ? Years of education: Not on file  ? Highest education level: Not on file  ?Occupational History  ? Not on file  ?Tobacco Use  ? Smoking status: Former  ?  Packs/day: 2.00  ?  Years: 20.00  ?  Pack years: 40.00  ?  Types: Cigarettes  ?  Quit date: 58  ?  Years since quitting: 43.3  ? Smokeless tobacco: Never  ? Tobacco comments:  ?  Stopped early 73s  ?Vaping Use  ? Vaping Use: Never used  ?Substance and Sexual Activity  ? Alcohol use: No  ? Drug use: No  ? Sexual activity: Yes  ?Other Topics Concern  ? Not on file  ?Social History Narrative  ? Lives at home with spouse  ? Right handed  ? Caffeine: 2 cups/day, tea seldom  ? ?Social Determinants of Health  ? ?Financial Resource Strain: Not on file  ?Food Insecurity: Not on file  ?Transportation Needs: Not on file  ?Physical Activity: Not on file  ?Stress: Not on file  ?Social Connections: Not on file  ?Intimate Partner Violence: Not on file  ? ? ?Family History  ?Problem Relation Age of Onset  ? CVA Mother   ? Cancer Maternal Grandmother   ? Healthy Son   ? Colon cancer Neg Hx   ? ? ?Past Medical History:  ?Diagnosis Date  ? Anxiety and depression   ? Chronic renal  insufficiency, stage 3 (moderate) (HCC)   ? DDD (degenerative disc disease), lumbar   ? remote hx of fusion surgery  ? GERD (gastroesophageal reflux disease)   ? Gout   ? History of stomach ulcers 2000  ? Hyperlipidemia   ? Hypertension   ? Rheumatoid arthritis ([place])   ? Rheumatoid   ? ? ?Patient Active Problem List  ? Diagnosis Date Noted  ? 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 New onset headache 01/03/2022  ? Altered mental status 12/27/2021  ? Headache 12/27/2021  ? Periorbital pain, bilateral 12/27/2021  ? Anxiety and depression 12/27/2021  ? Chronic gouty arthritis 04/23/2021  ? Chronic pain 04/23/2021  ? Essential hypertension 04/23/2021  ? Fatigue 04/23/2021  ? Hyperlipidemia 04/23/2021  ? Insomnia 04/23/2021  ? Rheumatoid arthritis ([place]) 04/23/2021  ? Intermediate stage nonexudative age-related macular degeneration of both eyes 07/22/2020  ? Posterior vitreous detachment of right eye 07/22/2020  ? Vitreomacular adhesion of left eye 07/22/2020  ? Cholelithiasis with chronic cholecystitis 06/05/2017  ? Umbilical hernia 22/29/7989  ? Cholelithiasis 05/09/2017  ? GERD 10/15/2010  ? NAUSEA WITH VOMITING 10/15/2010  ? COLONIC POLYPS 11/25/2008  ? GASTRIC ULCER 11/25/2008  ? DUODENAL ULCER 11/25/2008  ? DIVERTICULOSIS, COLON 11/25/2008  ? ARTHRITIS, RHEUMATOID 11/25/2008  ? NAUSEA 11/25/2008  ? ABDOMINAL PAIN-EPIGASTRIC 11/25/2008  ?  ABDOMINAL PAIN -GENERALIZED 11/25/2008  ? ? ?Past Surgical History:  ?Procedure Laterality Date  ? APPENDECTOMY  1968  ? back fusion  2000  ? CHOLECYSTECTOMY N/A 06/06/2017  ? Procedure: LAPAROSCOPIC CHOLECYSTECTOMY WITH INTRAOPERATIVE CHOLANGIOGRAM;  Surgeon: [name], MD;  Location: [facility];  Service: General;  Laterality: N/A;  ? EYE SURGERY    ? Dr. [name]  lens implants  ? LUMBAR LAMINECTOMY  2000  ? TIBIA FRACTURE SURGERY Left 1998  ? with titanium rods  ? UMBILICAL HERNIA REPAIR N/A 06/06/2017  ? Procedure: UMBILICAL HERNIA REPAIR;  Surgeon: [name], MD;   Location: [facility];  Service: General;  Laterality: N/A;  ? ? ?Current Outpatient Medications  ?Medication Sig Dispense Refill  ? acetaminophen (TYLENOL) 500 MG tablet Take 1 tablet (500 mg total) by mouth every 6 (six) hour

## 2022-01-03 NOTE — Telephone Encounter (Signed)
Referral sent to [facility] Endocr[facility]ology [phone]. ?

## 2022-01-04 ENCOUNTER — Other Ambulatory Visit: Payer: Self-pay | Admitting: Neurology

## 2022-01-04 ENCOUNTER — Encounter: Payer: Self-pay | Admitting: Neurology

## 2022-01-04 MED ORDER — ONDANSETRON 8 MG PO TBDP: 8.0000 mg | ORAL_TABLET | Freq: Three times a day (TID) | ORAL | 6 refills | Status: DC | PRN

## 2022-01-04 NOTE — Telephone Encounter (Signed)
[facility] referral on proficient. Ph # [phone] ?

## 2022-01-05 DIAGNOSIS — Z8673 Personal history of transient ischemic attack (TIA), and cerebral infarction without residual deficits: Secondary | ICD-10-CM | POA: Diagnosis not present

## 2022-01-05 DIAGNOSIS — Z09 Encounter for follow-up examination after completed treatment for conditions other than malignant neoplasm: Secondary | ICD-10-CM | POA: Diagnosis not present

## 2022-01-05 DIAGNOSIS — D352 Benign neoplasm of pituitary gland: Secondary | ICD-10-CM | POA: Diagnosis not present

## 2022-01-05 DIAGNOSIS — I10 Essential (primary) hypertension: Secondary | ICD-10-CM | POA: Diagnosis not present

## 2022-01-05 LAB — T4, FREE: Free T4: 0.91 ng/dL (ref 0.82–1.77)

## 2022-01-05 LAB — FOLLICLE STIMULATING HORMONE: FSH: 4.4 m[IU]/mL (ref 1.5–12.4)

## 2022-01-05 LAB — PROLACTIN: Prolactin: 9.4 ng/mL (ref 4.0–15.2)

## 2022-01-05 LAB — PTH, INTACT AND CALCIUM
Calcium: 10.1 mg/dL (ref 8.6–10.2)
PTH: 20 pg/mL (ref 15–65)

## 2022-01-05 LAB — TESTOSTERONE: Testosterone: 33 ng/dL — ABNORMAL LOW (ref 264–916)

## 2022-01-05 LAB — LUTEINIZING HORMONE: LH: 2.6 m[IU]/mL (ref 1.7–8.6)

## 2022-01-05 LAB — INSULIN-LIKE GROWTH FACTOR: Insulin-Like GF-1: 57 ng/mL (ref 45–207)

## 2022-01-05 LAB — TSH: TSH: 1.77 u[IU]/mL (ref 0.450–4.500)

## 2022-01-05 LAB — CORTISOL, BASELINE: Cortisol, Baseline: 1.9 ug/dL — ABNORMAL LOW (ref 6.2–19.4)

## 2022-01-09 ENCOUNTER — Other Ambulatory Visit: Payer: Self-pay

## 2022-01-09 ENCOUNTER — Encounter (HOSPITAL_COMMUNITY): Payer: Self-pay

## 2022-01-09 ENCOUNTER — Emergency Department (HOSPITAL_COMMUNITY): Payer: Medicare Other

## 2022-01-09 ENCOUNTER — Inpatient Hospital Stay (HOSPITAL_COMMUNITY)
Admission: EM | Admit: 2022-01-09 | Discharge: 2022-01-14 | DRG: 391 | Disposition: A | Discharge status: Home Health | Payer: Medicare Other | Attending: Internal Medicine | Admitting: Internal Medicine

## 2022-01-09 DIAGNOSIS — E869 Volume depletion, unspecified: Secondary | ICD-10-CM | POA: Diagnosis not present

## 2022-01-09 DIAGNOSIS — F32A Depression, unspecified: Secondary | ICD-10-CM | POA: Diagnosis present

## 2022-01-09 DIAGNOSIS — R1111 Vomiting without nausea: Secondary | ICD-10-CM | POA: Diagnosis not present

## 2022-01-09 DIAGNOSIS — R531 Weakness: Secondary | ICD-10-CM | POA: Diagnosis not present

## 2022-01-09 DIAGNOSIS — A419 Sepsis, unspecified organism: Secondary | ICD-10-CM

## 2022-01-09 DIAGNOSIS — Z20822 Contact with and (suspected) exposure to covid-19: Secondary | ICD-10-CM | POA: Diagnosis present

## 2022-01-09 DIAGNOSIS — E440 Moderate protein-calorie malnutrition: Secondary | ICD-10-CM | POA: Diagnosis not present

## 2022-01-09 DIAGNOSIS — G43901 Migraine, unspecified, not intractable, with status migrainosus: Secondary | ICD-10-CM | POA: Diagnosis not present

## 2022-01-09 DIAGNOSIS — G9341 Metabolic encephalopathy: Secondary | ICD-10-CM | POA: Diagnosis not present

## 2022-01-09 DIAGNOSIS — R0689 Other abnormalities of breathing: Secondary | ICD-10-CM | POA: Diagnosis not present

## 2022-01-09 DIAGNOSIS — F419 Anxiety disorder, unspecified: Secondary | ICD-10-CM | POA: Diagnosis present

## 2022-01-09 DIAGNOSIS — N179 Acute kidney failure, unspecified: Secondary | ICD-10-CM | POA: Diagnosis present

## 2022-01-09 DIAGNOSIS — H4710 Unspecified papilledema: Secondary | ICD-10-CM | POA: Diagnosis not present

## 2022-01-09 DIAGNOSIS — M109 Gout, unspecified: Secondary | ICD-10-CM | POA: Diagnosis present

## 2022-01-09 DIAGNOSIS — K219 Gastro-esophageal reflux disease without esophagitis: Secondary | ICD-10-CM | POA: Diagnosis not present

## 2022-01-09 DIAGNOSIS — H547 Unspecified visual loss: Secondary | ICD-10-CM | POA: Diagnosis not present

## 2022-01-09 DIAGNOSIS — Z7952 Long term (current) use of systemic steroids: Secondary | ICD-10-CM

## 2022-01-09 DIAGNOSIS — R1110 Vomiting, unspecified: Secondary | ICD-10-CM | POA: Diagnosis not present

## 2022-01-09 DIAGNOSIS — Z87891 Personal history of nicotine dependence: Secondary | ICD-10-CM | POA: Diagnosis not present

## 2022-01-09 DIAGNOSIS — G8929 Other chronic pain: Secondary | ICD-10-CM | POA: Diagnosis present

## 2022-01-09 DIAGNOSIS — H5340 Unspecified visual field defects: Secondary | ICD-10-CM | POA: Diagnosis not present

## 2022-01-09 DIAGNOSIS — K5792 Diverticulitis of intestine, part unspecified, without perforation or abscess without bleeding: Secondary | ICD-10-CM | POA: Diagnosis not present

## 2022-01-09 DIAGNOSIS — E785 Hyperlipidemia, unspecified: Secondary | ICD-10-CM | POA: Diagnosis present

## 2022-01-09 DIAGNOSIS — D352 Benign neoplasm of pituitary gland: Secondary | ICD-10-CM | POA: Diagnosis present

## 2022-01-09 DIAGNOSIS — R519 Headache, unspecified: Secondary | ICD-10-CM | POA: Diagnosis not present

## 2022-01-09 DIAGNOSIS — M069 Rheumatoid arthritis, unspecified: Secondary | ICD-10-CM | POA: Diagnosis present

## 2022-01-09 DIAGNOSIS — G441 Vascular headache, not elsewhere classified: Secondary | ICD-10-CM | POA: Diagnosis present

## 2022-01-09 DIAGNOSIS — Z6826 Body mass index (BMI) 26.0-26.9, adult: Secondary | ICD-10-CM

## 2022-01-09 DIAGNOSIS — K5732 Diverticulitis of large intestine without perforation or abscess without bleeding: Principal | ICD-10-CM | POA: Diagnosis present

## 2022-01-09 DIAGNOSIS — R6520 Severe sepsis without septic shock: Secondary | ICD-10-CM

## 2022-01-09 DIAGNOSIS — Z981 Arthrodesis status: Secondary | ICD-10-CM | POA: Diagnosis not present

## 2022-01-09 DIAGNOSIS — I129 Hypertensive chronic kidney disease with stage 1 through stage 4 chronic kidney disease, or unspecified chronic kidney disease: Secondary | ICD-10-CM | POA: Diagnosis present

## 2022-01-09 DIAGNOSIS — I10 Essential (primary) hypertension: Secondary | ICD-10-CM | POA: Diagnosis present

## 2022-01-09 DIAGNOSIS — E876 Hypokalemia: Secondary | ICD-10-CM | POA: Diagnosis not present

## 2022-01-09 DIAGNOSIS — I6782 Cerebral ischemia: Secondary | ICD-10-CM | POA: Diagnosis not present

## 2022-01-09 DIAGNOSIS — I2510 Atherosclerotic heart disease of native coronary artery without angina pectoris: Secondary | ICD-10-CM | POA: Diagnosis present

## 2022-01-09 DIAGNOSIS — G459 Transient cerebral ischemic attack, unspecified: Secondary | ICD-10-CM | POA: Diagnosis not present

## 2022-01-09 DIAGNOSIS — N1832 Chronic kidney disease, stage 3b: Secondary | ICD-10-CM | POA: Diagnosis not present

## 2022-01-09 DIAGNOSIS — Z823 Family history of stroke: Secondary | ICD-10-CM | POA: Diagnosis not present

## 2022-01-09 DIAGNOSIS — N189 Chronic kidney disease, unspecified: Secondary | ICD-10-CM | POA: Diagnosis not present

## 2022-01-09 DIAGNOSIS — Z79899 Other long term (current) drug therapy: Secondary | ICD-10-CM

## 2022-01-09 DIAGNOSIS — R110 Nausea: Secondary | ICD-10-CM | POA: Diagnosis not present

## 2022-01-09 DIAGNOSIS — Z8711 Personal history of peptic ulcer disease: Secondary | ICD-10-CM | POA: Diagnosis not present

## 2022-01-09 DIAGNOSIS — N281 Cyst of kidney, acquired: Secondary | ICD-10-CM | POA: Diagnosis not present

## 2022-01-09 DIAGNOSIS — Z743 Need for continuous supervision: Secondary | ICD-10-CM | POA: Diagnosis not present

## 2022-01-09 LAB — COMPREHENSIVE METABOLIC PANEL
ALT: 20 U/L (ref 0–44)
AST: 38 U/L (ref 15–41)
Albumin: 3 g/dL — ABNORMAL LOW (ref 3.5–5.0)
Alkaline Phosphatase: 70 U/L (ref 38–126)
Anion gap: 9 (ref 5–15)
BUN: 64 mg/dL — ABNORMAL HIGH (ref 8–23)
CO2: 24 mmol/L (ref 22–32)
Calcium: 8.6 mg/dL — ABNORMAL LOW (ref 8.9–10.3)
Chloride: 107 mmol/L (ref 98–111)
Creatinine, Ser: 2.71 mg/dL — ABNORMAL HIGH (ref 0.61–1.24)
GFR, Estimated: 23 mL/min — ABNORMAL LOW (ref >60)
Glucose, Bld: 116 mg/dL — ABNORMAL HIGH (ref 70–99)
Potassium: 4.4 mmol/L (ref 3.5–5.1)
Sodium: 140 mmol/L (ref 135–145)
Total Bilirubin: 1.1 mg/dL (ref 0.3–1.2)
Total Protein: 6.8 g/dL (ref 6.5–8.1)

## 2022-01-09 LAB — LACTIC ACID, PLASMA
Lactic Acid, Venous: 1.4 mmol/L (ref 0.5–1.9)
Lactic Acid, Venous: 1.1 mmol/L (ref 0.5–1.9)

## 2022-01-09 LAB — URINALYSIS, ROUTINE W REFLEX MICROSCOPIC
Bacteria, UA: NONE SEEN
Bilirubin Urine: NEGATIVE
Glucose, UA: NEGATIVE mg/dL
Ketones, ur: 5 mg/dL — AB
Leukocytes,Ua: NEGATIVE
Nitrite: NEGATIVE
Protein, ur: NEGATIVE mg/dL
Specific Gravity, Urine: 1.014 (ref 1.005–1.030)
pH: 5 (ref 5.0–8.0)

## 2022-01-09 LAB — CBC
HCT: 39.6 % (ref 39.0–52.0)
Hemoglobin: 13.2 g/dL (ref 13.0–17.0)
MCH: 30.9 pg (ref 26.0–34.0)
MCHC: 33.3 g/dL (ref 30.0–36.0)
MCV: 92.7 fL (ref 80.0–100.0)
Platelets: 269 10*3/uL (ref 150–400)
RBC: 4.27 MIL/uL (ref 4.22–5.81)
RDW: 14 % (ref 11.5–15.5)
WBC: 16.9 10*3/uL — ABNORMAL HIGH (ref 4.0–10.5)
nRBC: 0 % (ref 0.0–0.2)

## 2022-01-09 LAB — RESP PANEL BY RT-PCR (FLU A&B, COVID) ARPGX2
Influenza A by PCR: NEGATIVE
Influenza B by PCR: NEGATIVE
SARS Coronavirus 2 by RT PCR: NEGATIVE

## 2022-01-09 LAB — PROTIME-INR
INR: 1.2 (ref 0.8–1.2)
Prothrombin Time: 15.5 seconds — ABNORMAL HIGH (ref 11.4–15.2)

## 2022-01-09 LAB — APTT: aPTT: 28 seconds (ref 24–36)

## 2022-01-09 LAB — LIPASE, BLOOD: Lipase: 47 U/L (ref 11–51)

## 2022-01-09 MED ORDER — DIPHENHYDRAMINE HCL 50 MG/ML IJ SOLN
25.0000 mg | Freq: Once | INTRAMUSCULAR | Status: AC
  Administered 2022-01-09: 25 mg via INTRAVENOUS
  Filled 2022-01-09: qty 1

## 2022-01-09 MED ORDER — ESCITALOPRAM OXALATE 10 MG PO TABS
20.0000 mg | ORAL_TABLET | Freq: Every day | ORAL | Status: DC
  Administered 2022-01-09 – 2022-01-13 (×5): 20 mg via ORAL
  Filled 2022-01-09 (×5): qty 2

## 2022-01-09 MED ORDER — LACTATED RINGERS IV SOLN: INTRAVENOUS | Status: DC

## 2022-01-09 MED ORDER — LACTATED RINGERS IV BOLUS (SEPSIS)
2000.0000 mL | Freq: Once | INTRAVENOUS | Status: AC
  Administered 2022-01-09: 2000 mL via INTRAVENOUS

## 2022-01-09 MED ORDER — FOLIC ACID 1 MG PO TABS
1.0000 mg | ORAL_TABLET | Freq: Every day | ORAL | Status: DC
  Administered 2022-01-09: 1 mg via ORAL
  Filled 2022-01-09 (×2): qty 1

## 2022-01-09 MED ORDER — ONDANSETRON HCL 4 MG/2ML IJ SOLN
4.0000 mg | Freq: Four times a day (QID) | INTRAMUSCULAR | Status: DC | PRN
  Administered 2022-01-10: 4 mg via INTRAVENOUS
  Filled 2022-01-09: qty 2

## 2022-01-09 MED ORDER — ACETAMINOPHEN 650 MG RE SUPP: 650.0000 mg | Freq: Four times a day (QID) | RECTAL | Status: DC | PRN

## 2022-01-09 MED ORDER — ALLOPURINOL 100 MG PO TABS
300.0000 mg | ORAL_TABLET | Freq: Every day | ORAL | Status: DC
  Administered 2022-01-09 – 2022-01-14 (×5): 300 mg via ORAL
  Filled 2022-01-09 (×6): qty 3

## 2022-01-09 MED ORDER — METRONIDAZOLE 500 MG/100ML IV SOLN
500.0000 mg | Freq: Once | INTRAVENOUS | Status: AC
  Administered 2022-01-09: 500 mg via INTRAVENOUS
  Filled 2022-01-09: qty 100

## 2022-01-09 MED ORDER — PRAVASTATIN SODIUM 40 MG PO TABS
40.0000 mg | ORAL_TABLET | Freq: Every day | ORAL | Status: DC
  Administered 2022-01-09 – 2022-01-14 (×5): 40 mg via ORAL
  Filled 2022-01-09 (×6): qty 1

## 2022-01-09 MED ORDER — LACTATED RINGERS IV SOLN: INTRAVENOUS | Status: AC

## 2022-01-09 MED ORDER — METRONIDAZOLE 500 MG/100ML IV SOLN
500.0000 mg | Freq: Three times a day (TID) | INTRAVENOUS | Status: DC
  Administered 2022-01-09 – 2022-01-10 (×3): 500 mg via INTRAVENOUS
  Filled 2022-01-09 (×3): qty 100

## 2022-01-09 MED ORDER — METRONIDAZOLE 500 MG/100ML IV SOLN: 500.0000 mg | Freq: Two times a day (BID) | INTRAVENOUS | Status: DC

## 2022-01-09 MED ORDER — SODIUM CHLORIDE 0.9 % IV SOLN
2.0000 g | Freq: Once | INTRAVENOUS | Status: AC
  Administered 2022-01-09: 2 g via INTRAVENOUS
  Filled 2022-01-09: qty 20

## 2022-01-09 MED ORDER — HEPARIN SODIUM (PORCINE) 5000 UNIT/ML IJ SOLN
5000.0000 [IU] | Freq: Three times a day (TID) | INTRAMUSCULAR | Status: DC
  Administered 2022-01-09 – 2022-01-14 (×15): 5000 [IU] via SUBCUTANEOUS
  Filled 2022-01-09 (×15): qty 1

## 2022-01-09 MED ORDER — FENTANYL CITRATE PF 50 MCG/ML IJ SOSY
50.0000 ug | PREFILLED_SYRINGE | INTRAMUSCULAR | Status: AC | PRN
  Administered 2022-01-09 (×2): 50 ug via INTRAVENOUS
  Filled 2022-01-09 (×2): qty 1

## 2022-01-09 MED ORDER — TRAZODONE HCL 50 MG PO TABS
50.0000 mg | ORAL_TABLET | Freq: Every evening | ORAL | Status: DC | PRN
  Administered 2022-01-09 – 2022-01-13 (×3): 50 mg via ORAL
  Filled 2022-01-09 (×3): qty 1

## 2022-01-09 MED ORDER — BOOST / RESOURCE BREEZE PO LIQD CUSTOM
1.0000 | Freq: Three times a day (TID) | ORAL | Status: DC
Start: 2022-01-09 — End: 2022-01-14
  Administered 2022-01-09 – 2022-01-13 (×6): 1 via ORAL

## 2022-01-09 MED ORDER — SODIUM CHLORIDE 0.9 % IV SOLN
2.0000 g | INTRAVENOUS | Status: DC
  Administered 2022-01-10: 2 g via INTRAVENOUS
  Filled 2022-01-09: qty 20

## 2022-01-09 MED ORDER — ONDANSETRON HCL 4 MG/2ML IJ SOLN
4.0000 mg | Freq: Once | INTRAMUSCULAR | Status: DC
Start: 2022-01-09 — End: 2022-01-09

## 2022-01-09 MED ORDER — PROCHLORPERAZINE EDISYLATE 10 MG/2ML IJ SOLN
10.0000 mg | Freq: Once | INTRAMUSCULAR | Status: AC
Start: 2022-01-09 — End: 2022-01-09
  Administered 2022-01-09: 10 mg via INTRAVENOUS
  Filled 2022-01-09: qty 2

## 2022-01-09 MED ORDER — ENSURE ENLIVE PO LIQD: 237.0000 mL | Freq: Two times a day (BID) | ORAL | Status: DC

## 2022-01-09 MED ORDER — PROPRANOLOL HCL ER 60 MG PO CP24
60.0000 mg | ORAL_CAPSULE | Freq: Every day | ORAL | Status: DC
  Administered 2022-01-09 – 2022-01-14 (×6): 60 mg via ORAL
  Filled 2022-01-09 (×9): qty 1

## 2022-01-09 MED ORDER — ACETAMINOPHEN 325 MG PO TABS
650.0000 mg | ORAL_TABLET | Freq: Four times a day (QID) | ORAL | Status: DC | PRN
  Administered 2022-01-10 – 2022-01-12 (×2): 650 mg via ORAL
  Filled 2022-01-09 (×2): qty 2

## 2022-01-09 MED ORDER — ONDANSETRON HCL 4 MG PO TABS: 4.0000 mg | ORAL_TABLET | Freq: Four times a day (QID) | ORAL | Status: DC | PRN

## 2022-01-09 NOTE — H&P (Signed)
?History and Physical  ? ? ?Patient: [name] DJM:[id number] DOB: [birth year] ?DOA: 01/09/2022 ?DOS: the patient was seen and examined on 01/09/2022 ?PCP: Deon Pilling, NP  ?Patient coming from: Home ? ?Chief Complaint:  ?Chief Complaint  ?Patient presents with  ? Emesis  ? ?HPI: [name] is a 76 year old male with a history of hypertension, hyperlipidemia, coronary artery disease, CKD stage III, depression, pituitary macroadenoma presenting with 2 to 3-day history of abdominal pain with associated nausea and vomiting.  The patient has a history of chronic abdominal pain, but it had significantly worsened, primarily in the left-sided abdomen over the past 2 to 3 days.  There is no hematemesis.  The patient did have loose stools without any hematochezia or melena.  There is been some subjective fevers and chills.  The patient's spouse at the bedside supplements the history.  There is been no chest pain, shortness breath, coughing, hemoptysis, dysuria, hematuria.  The patient has been suffering from chronic headaches for the past 2 months. ?Regarding his headaches, the patient was admitted to [facility] from 12/27/2021 to 12/28/2021.  At that time, the patient was seen by neurology for his headaches.  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  Neurosurgery was consulted.  They recommended nonoperative management and outpatient follow-up as well as endocrine follow-up.  Ophthalmology saw the patient and noted the patient had bitemporal visual field deficits with mild disc edema.  They recommended outpatient visual field testing.  The patient did follow-up with outpatient neurology, Dr. Jaynee Eagles on 01/03/2022.  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  The patient was prescribed propranolol and Percocet.  A referral was made to [facility] endocrinology.  According to the patient's spouse, the endocrine appointment is on 03/29/2022. ? ?In the ED,  the patient was afebrile hemodynamically stable with oxygen saturation 100% room air.  Sodium 140, potassium 4.4, bicarbonate 24, serum creatinine 2.71.  LFTs were unremarkable.  Lipase 47.  WBC 16.9, hemoglobin 13.2, platelets 269,000.  CT of the brain showed chronic small vessel disease and previously seen pituitary adenoma up to 1.3 cm.  CT of the abdomen and pelvis showed acute diverticulitis of the proximal sigmoid with edema and fat stranding without abscess.  The patient was started on ceftriaxone, metronidazole, IV fluids.  He was admitted for further evaluation and treatment of his diverticulitis and renal failure. ? ?Review of Systems: As mentioned in the history of present illness. All other systems reviewed and are negative. ?Past Medical History:  ?Diagnosis Date  ? Anxiety and depression   ? Chronic renal insufficiency, stage 3 (moderate) (HCC)   ? DDD (degenerative disc disease), lumbar   ? remote hx of fusion surgery  ? GERD (gastroesophageal reflux disease)   ? Gout   ? History of stomach ulcers 2000  ? Hyperlipidemia   ? Hypertension   ? Rheumatoid arthritis ([place])   ? Rheumatoid   ? ?Past Surgical History:  ?Procedure Laterality Date  ? APPENDECTOMY  1968  ? back fusion  2000  ? CHOLECYSTECTOMY N/A 06/06/2017  ? Procedure: LAPAROSCOPIC CHOLECYSTECTOMY WITH INTRAOPERATIVE CHOLANGIOGRAM;  Surgeon: [name], MD;  Location: [facility];  Service: General;  Laterality: N/A;  ? EYE SURGERY    ? Dr. [name]  lens implants  ? LUMBAR LAMINECTOMY  2000  ? TIBIA FRACTURE SURGERY Left 1998  ? with titanium rods  ? UMBILICAL HERNIA REPAIR N/A 06/06/2017  ? Procedure: UMBILICAL HERNIA REPAIR;  Surgeon: [name], MD;  Location: [facility];  Service: General;  Laterality: N/A;  ? ?Social History:  reports that he quit smoking about 43 years ago. His smoking use included cigarettes. He has a 40.00 pack-year smoking history. He has never used smokeless tobacco. He reports that he does not drink alcohol and does not  use drugs. ? ?No Known Allergies ? ?Family History  ?Problem Relation Age of Onset  ? CVA Mother   ? Cancer Maternal Grandmother   ? Healthy Son   ? Colon cancer Neg Hx   ? ? ?Prior to Admission medications   ?Medication Sig Start Date End Date Taking? Authorizing Provider  ?acetaminophen (TYLENOL) 500 MG tablet Take 1 tablet (500 mg total) by mouth every 6 (six) hours as needed for mild pain or headache. 12/28/21   Raiford Noble Latif, DO  ?allopurinol (ZYLOPRIM) 300 MG tablet Take 300 mg by mouth daily.    [provider]  ?escitalopram (LEXAPRO) 20 MG tablet Take 20 mg by mouth at bedtime.     [provider]  ?Etanercept (ENBREL [place]) Inject 1 Dose into the skin every Monday.    [provider]  ?folic acid (FOLVITE) 1 MG tablet Take 1 mg by mouth daily.    [provider]  ?losartan (COZAAR) 25 MG tablet Take 25 mg by mouth daily.    [provider]  ?meloxicam (MOBIC) 7.5 MG tablet Take 7.5 mg by mouth daily as needed for pain. 12/12/21   [provider]  ?Omega-3 Fatty Acids (FISH OIL) 1200 MG CAPS Take 6,000 mg by mouth daily. Takes 5 capsules every morning    [provider]  ?ondansetron (ZOFRAN-ODT) 8 MG disintegrating tablet Take 1 tablet (8 mg total) by mouth every 8 (eight) hours as needed for nausea or vomiting. 01/04/22   Melvenia Beam, MD  ?oxyCODONE-acetaminophen (PERCOCET) 10-325 MG tablet Take 1 tablet by mouth every 6 (six) hours as needed for pain. 01/03/22   Melvenia Beam, MD  ?pantoprazole (PROTONIX) 40 MG tablet Take 40 mg by mouth daily.    [provider]  ?pravastatin (PRAVACHOL) 40 MG tablet Take 40 mg by mouth daily. 04/15/21   [provider]  ?predniSONE (DELTASONE) 5 MG tablet Take 5 mg by mouth daily with breakfast.    [provider]  ?propranolol ER (INDERAL LA) 60 MG 24 hr capsule Take 1 capsule (60 mg total) by mouth daily. 01/03/22   Melvenia Beam, MD  ?testosterone cypionate  (DEPOTESTOSTERONE CYPIONATE) 200 MG/ML injection Inject 1 mL into the muscle every 28 (twenty-eight) days. 09/20/17   [provider]  ?traZODone (DESYREL) 50 MG tablet Take 50 mg by mouth at bedtime as needed for sleep. 02/13/21   [provider]  ? ? ?Physical Exam: ?Vitals:  ? 01/09/22 0837 01/09/22 0838 01/09/22 1130  ?BP:  134/72 135/64  ?Pulse:  68 68  ?Resp:  20 12  ?Temp:  98.1 ?F (36.7 ?C)   ?TempSrc:  Oral   ?SpO2:  100% 100%  ?Weight: 78.5 kg    ?Height: '5\' 8"'$  (1.727 m)    ? ?GENERAL:  Awake and alert  NAD, well developed, cooperative, follows commands ?HEENT: [place]/AT, No thrush, No icterus, No oral ulcers ?Neck:  No neck mass, No meningismus, soft, supple ?CV: RRR, no S3, no S4, no rub, no JVD ?Lungs:  diminished BS at bases.  No wheeze ?Abd: soft/LLQ tender +BS, nondistended ?Ext: No edema, no lymphangitis, no cyanosis, no  rashes ?Neuro:  CN II-XII intact, strength 4/5 in RUE, RLE, strength 4/5 LUE, LLE; sensation intact bilateral; no dysmetria; babinski equivocal ? ?Data Reviewed: ?Labs reviewed in history ? ?Assessment and Plan: ?* Acute diverticulitis ?Continue ceftriaxone and metronidazole ?Clear liquid diet for now ?Continue IV fluids ?Continue IV opioids ? ?Acute renal failure superimposed on stage 3b chronic kidney disease ([place]) ?Secondary to volume depletion ?Baseline creatinine 1.5-1.7 ?Patient presented with serum creatinine of 2.71 ?Continue IV fluids ?A.m. BMP ? ?Chronic daily headache ?Attributed to the patient's pituitary macroadenoma and vascular headache ?Patient will receive IV opioids and lieu of his Percocet ? ?Pituitary macroadenoma ([place]) ?Contributing to the patient's chronic daily headache ?Initially seen on MRI brain 12/28/2021 ?Patient has outpatient neurology, neurosurgery, and endocrine appointments ? ?Anxiety and depression ?Continue Lexapro when able to tolerate p.o. ? ?Hyperlipidemia ?Continue statin when able to tolerate p.o. ? ?Essential hypertension ?Holding  losartan secondary to AKI ?Patient has been on propanolol for his chronic headache prophylaxis ?Monitor clinically ? ? ? ? ? Advance Care Planning: FULL CODE ? ?Consults: none ? ?Family Communication: spouse updated 5/

## 2022-01-09 NOTE — Progress Notes (Signed)
Elink following for sepsis protocol. 

## 2022-01-09 NOTE — Assessment & Plan Note (Addendum)
Contributing to the patient's chronic daily headache ?Initially seen on MRI brain 12/28/2021--on impingement on optic chiasm ?Patient has outpatient neurology, neurosurgery, and endocrine appointments ?--pt has neurosurgery appointment 01/28/21 ?--I've made ambulatory referral to [place] [facility] per family request ?

## 2022-01-09 NOTE — ED Provider Notes (Signed)
?[facility] ?Provider Note ? ? ?CSN: [id number] ?Arrival date & time: 01/09/22  0827 ? ?  ? ?History ? ?Chief Complaint  ?Patient presents with  ? Emesis  ? ? ?[name] is a 76 y.o. male. ? ?HPI ? ?  ? ?76 y.o. male with medical history significant of hypertension, hyperlipidemia, CKD stage III, anxiety, depression, and GERD comes in with chief complaint of nausea and vomiting for the last 3 days along with some abdominal pain.  Patient was also diagnosed with pituitary macroadenoma recently and has some visual defects secondary to it and headaches. ? ?Patient's wife is at the bedside and provides meaningful history as well. ? ?Patient indicates that he started having nausea and vomiting over the last 2 or 3 days.  Initially the emesis was usually clear or food material, but per wife, yesterday the emesis became more bilious.  Patient is also complained of abdominal pain to her. ? ?Patient indicates that the pain is left-sided.  He denies any diarrhea.  Last BM was normal and nonbloody.  Emesis has been nonbloody as well.  Patient also continues to have headaches.  He denies any new neurologic symptoms. ? ?Home Medications ?Prior to Admission medications   ?Medication Sig Start Date End Date Taking? Authorizing Provider  ?acetaminophen (TYLENOL) 500 MG tablet Take 1 tablet (500 mg total) by mouth every 6 (six) hours as needed for mild pain or headache. 12/28/21   Raiford Noble Latif, DO  ?allopurinol (ZYLOPRIM) 300 MG tablet Take 300 mg by mouth daily.    [provider]  ?escitalopram (LEXAPRO) 20 MG tablet Take 20 mg by mouth at bedtime.     [provider]  ?Etanercept (ENBREL [place]) Inject 1 Dose into the skin every Monday.    [provider]  ?folic acid (FOLVITE) 1 MG tablet Take 1 mg by mouth daily.    [provider]  ?losartan (COZAAR) 25 MG tablet Take 25 mg by mouth daily.    [provider]  ?meloxicam (MOBIC) 7.5 MG tablet Take 7.5 mg by  mouth daily as needed for pain. 12/12/21   [provider]  ?Omega-3 Fatty Acids (FISH OIL) 1200 MG CAPS Take 6,000 mg by mouth daily. Takes 5 capsules every morning    [provider]  ?ondansetron (ZOFRAN-ODT) 8 MG disintegrating tablet Take 1 tablet (8 mg total) by mouth every 8 (eight) hours as needed for nausea or vomiting. 01/04/22   Melvenia Beam, MD  ?oxyCODONE-acetaminophen (PERCOCET) 10-325 MG tablet Take 1 tablet by mouth every 6 (six) hours as needed for pain. 01/03/22   Melvenia Beam, MD  ?pantoprazole (PROTONIX) 40 MG tablet Take 40 mg by mouth daily.    [provider]  ?pravastatin (PRAVACHOL) 40 MG tablet Take 40 mg by mouth daily. 04/15/21   [provider]  ?predniSONE (DELTASONE) 5 MG tablet Take 5 mg by mouth daily with breakfast.    [provider]  ?propranolol ER (INDERAL LA) 60 MG 24 hr capsule Take 1 capsule (60 mg total) by mouth daily. 01/03/22   Melvenia Beam, MD  ?testosterone cypionate (DEPOTESTOSTERONE CYPIONATE) 200 MG/ML injection Inject 1 mL into the muscle every 28 (twenty-eight) days. 09/20/17   [provider]  ?traZODone (DESYREL) 50 MG tablet Take 50 mg by mouth at bedtime as needed for sleep. 02/13/21   [provider]  ?   ? ?Allergies    ?Patient has no known allergies.   ? ?  Review of Systems   ?Review of Systems  ?All other systems reviewed and are negative. ? ?Physical Exam ?Updated Vital Signs ?BP 135/64   Pulse 68   Temp 98.1 ?F (36.7 ?C) (Oral)   Resp 12   Ht '5\' 8"'$  (1.727 m)   Wt 78.5 kg   SpO2 100%   BMI 26.30 kg/m?  ?Physical Exam ?Vitals and nursing note reviewed.  ?Constitutional:   ?   Appearance: He is well-developed.  ?HENT:  ?   Head: Atraumatic.  ?Eyes:  ?   General: No scleral icterus. ?Cardiovascular:  ?   Rate and Rhythm: Normal rate.  ?Pulmonary:  ?   Effort: Pulmonary effort is normal.  ?Abdominal:  ?   Tenderness: There is abdominal tenderness. There is guarding. There is no rebound.   ?Musculoskeletal:  ?   Cervical back: Neck supple.  ?Skin: ?   General: Skin is warm.  ?   Coloration: Skin is not jaundiced.  ?Neurological:  ?   Mental Status: He is alert and oriented to person, place, and time.  ? ? ?ED Results / Procedures / Treatments   ?Labs ?(all labs ordered are listed, but only abnormal results are displayed) ?Labs Reviewed  ?COMPREHENSIVE METABOLIC PANEL - Abnormal; Notable for the following components:  ?    Result Value  ? Glucose, Bld 116 (*)   ? BUN 64 (*)   ? Creatinine, Ser 2.71 (*)   ? Calcium 8.6 (*)   ? Albumin 3.0 (*)   ? GFR, Estimated 23 (*)   ? All other components within normal limits  ?CBC - Abnormal; Notable for the following components:  ? WBC 16.9 (*)   ? All other components within normal limits  ?RESP PANEL BY RT-PCR (FLU A&B, COVID) [id number]  ?CULTURE, BLOOD (ROUTINE X 2)  ?CULTURE, BLOOD (ROUTINE X 2)  ?LIPASE, BLOOD  ?URINALYSIS, ROUTINE W REFLEX MICROSCOPIC  ?LACTIC ACID, PLASMA  ?LACTIC ACID, PLASMA  ?PROTIME-INR  ?APTT  ? ? ?EKG ?None ? ?Radiology ?CT ABDOMEN PELVIS WO CONTRAST ? ?Result Date: 01/09/2022 ?CLINICAL DATA:  Nausea and vomiting.  Bowel obstruction suspected. EXAM: CT ABDOMEN AND PELVIS WITHOUT CONTRAST TECHNIQUE: Multidetector CT imaging of the abdomen and pelvis was performed following the standard protocol without IV contrast. RADIATION DOSE REDUCTION: This exam was performed according to the departmental dose-optimization program which includes automated exposure control, adjustment of the mA and/or kV according to patient size and/or use of iterative reconstruction technique. COMPARISON:  11/12/2020.  10/31/2017. FINDINGS: Lower chest: Linear atelectasis or scarring newly seen in the right lower lobe. No pleural fluid. Coronary artery calcification and aortic atherosclerotic calcification are noted. Hepatobiliary: Liver parenchyma appears normal without contrast. Previous cholecystectomy. Pancreas: Normal Spleen: Normal Adrenals/Urinary Tract:  Adrenal glands are normal. Small renal cysts as seen previously. No evidence of mass, stone, inflammatory change or hydroureteronephrosis. Bladder is normal. Stomach/Bowel: The stomach is normal. Small bowel is normal. No evidence of small-bowel obstruction. There is diverticulosis of the colon with acute diverticulitis in the proximal sigmoid region. Edematous stranding of the fat without evidence of frank abscess. No free fluid or air. Vascular/Lymphatic: Aortic atherosclerosis. Maximal diameter 2.6 cm, unchanged. IVC is normal. No adenopathy. Reproductive: Normal Other: None Musculoskeletal: Ordinary lower lumbar degenerative changes, most pronounced at L4-5. IMPRESSION: Acute diverticulitis of the sigmoid colon. Stranding of the regional fat but no evidence of abscess. No free fluid or air. No evidence of bowel obstruction. No acute solid organ pathology. Aortic atherosclerosis, unchanged. Electronically Signed  By: Nelson Chimes M.D.   On: 01/09/2022 12:02  ? ?CT Head Wo Contrast ? ?Result Date: 01/09/2022 ?CLINICAL DATA:  Headache, chronic, new features or increased frequency. Nausea and vomiting. EXAM: CT HEAD WITHOUT CONTRAST TECHNIQUE: Contiguous axial images were obtained from the base of the skull through the vertex without intravenous contrast. RADIATION DOSE REDUCTION: This exam was performed according to the departmental dose-optimization program which includes automated exposure control, adjustment of the mA and/or kV according to patient size and/or use of iterative reconstruction technique. COMPARISON:  12/27/2021 CT.  12/28/2021 MRI. FINDINGS: Brain: Chronic small-vessel ischemic changes affect the cerebral hemispheric white matter. No evidence of acute infarction, mass lesion, hemorrhage, hydrocephalus or extra-axial collection. Sellar and suprasellar mass as seen previously, without apparent change. Maximal dimension is proximally 1.3 cm. Vascular: There is atherosclerotic calcification of the  major vessels at the base of the brain. 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 No acute brain finding. Previously seen pituitary mass, m

## 2022-01-09 NOTE — Assessment & Plan Note (Addendum)
Secondary to volume depletion ?Baseline creatinine 1.5-1.7 ?Patient presented with serum creatinine of 2.71 ?Treated with IV fluids>>improved to baseline ?

## 2022-01-09 NOTE — Hospital Course (Signed)
76 year old male with a history of hypertension, hyperlipidemia, coronary artery disease, CKD stage III, depression, pituitary macroadenoma presenting with 2 to 3-day history of abdominal pain with associated nausea and vomiting.  The patient has a history of chronic abdominal pain, but it had significantly worsened, primarily in the left-sided abdomen over the past 2 to 3 days.  There is no hematemesis.  The patient did have loose stools without any hematochezia or melena.  There is been some subjective fevers and chills.  The patient's spouse at the bedside supplements the history.  There is been no chest pain, shortness breath, coughing, hemoptysis, dysuria, hematuria.  The patient has been suffering from chronic headaches for the past 2 months. ?Regarding his headaches, the patient was admitted to [facility] from 12/27/2021 to 12/28/2021.  At that time, the patient was seen by neurology for his headaches.  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  Neurosurgery was consulted.  They recommended nonoperative management and outpatient follow-up as well as endocrine follow-up.  Ophthalmology saw the patient and noted the patient had bitemporal visual field deficits with mild disc edema.  They recommended outpatient visual field testing.  The patient did follow-up with outpatient neurology, Dr. Jaynee Eagles on 01/03/2022.  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  The patient was prescribed propranolol and Percocet.  A referral was made to [facility] endocrinology.  According to the patient's spouse, the endocrine appointment is on 03/29/2022. ? ?In the ED, the patient was afebrile hemodynamically stable with oxygen saturation 100% room air.  Sodium 140, potassium 4.4, bicarbonate 24, serum creatinine 2.71.  LFTs were unremarkable.  Lipase 47.  WBC 16.9, hemoglobin 13.2, platelets 269,000.  CT of the brain showed chronic small vessel disease and  previously seen pituitary adenoma up to 1.3 cm.  CT of the abdomen and pelvis showed acute diverticulitis of the proximal sigmoid with edema and fat stranding without abscess.  The patient was started on ceftriaxone, metronidazole, IV fluids.  He was admitted for further evaluation and treatment of his diverticulitis and renal failure. ?

## 2022-01-09 NOTE — Assessment & Plan Note (Addendum)
Continue statin. 

## 2022-01-09 NOTE — ED Triage Notes (Signed)
Patient via EMS due to nausea and vomiting for ongoing nausea and vomiting.  ?

## 2022-01-09 NOTE — Assessment & Plan Note (Addendum)
Attributed to the patient's pituitary macroadenoma and vascular headache ?Judicious opioids--family notes he has been confused with the opioids at home ?Neurosurgery follow up already arranged ?

## 2022-01-09 NOTE — Assessment & Plan Note (Addendum)
Temporarily held losartan due to AKI but resumed now as renal function back to baseline ?Patient has been on propanolol for his chronic headache prophylaxis ?Follow up outpatient for ongoing surveillance/management ?

## 2022-01-09 NOTE — ED Notes (Signed)
Bladder Scan done. Showed 271 ml ?

## 2022-01-09 NOTE — Assessment & Plan Note (Signed)
Continue Lexapro when able to tolerate p.o. ?

## 2022-01-09 NOTE — Assessment & Plan Note (Addendum)
He was treated with IV zosyn as the metronidazole caused nausea--this has helped  ?He is now tolerating soft diet ?Treated with IV fluids and IV antibiotics  ?judicious IV opioids ?WBC trending down to normal and abdominal pain improved and tolerating soft diet ?Augmentin BID x 7 days to complete full course of antibiotics ? ?

## 2022-01-10 ENCOUNTER — Encounter: Payer: Self-pay | Admitting: Neurology

## 2022-01-10 DIAGNOSIS — R519 Headache, unspecified: Secondary | ICD-10-CM | POA: Diagnosis not present

## 2022-01-10 DIAGNOSIS — K5792 Diverticulitis of intestine, part unspecified, without perforation or abscess without bleeding: Secondary | ICD-10-CM | POA: Diagnosis not present

## 2022-01-10 DIAGNOSIS — I10 Essential (primary) hypertension: Secondary | ICD-10-CM | POA: Diagnosis not present

## 2022-01-10 DIAGNOSIS — N179 Acute kidney failure, unspecified: Secondary | ICD-10-CM | POA: Diagnosis not present

## 2022-01-10 LAB — BASIC METABOLIC PANEL
Anion gap: 7 (ref 5–15)
BUN: 45 mg/dL — ABNORMAL HIGH (ref 8–23)
CO2: 28 mmol/L (ref 22–32)
Calcium: 8.6 mg/dL — ABNORMAL LOW (ref 8.9–10.3)
Chloride: 108 mmol/L (ref 98–111)
Creatinine, Ser: 1.83 mg/dL — ABNORMAL HIGH (ref 0.61–1.24)
GFR, Estimated: 38 mL/min — ABNORMAL LOW (ref >60)
Glucose, Bld: 104 mg/dL — ABNORMAL HIGH (ref 70–99)
Potassium: 4 mmol/L (ref 3.5–5.1)
Sodium: 143 mmol/L (ref 135–145)

## 2022-01-10 LAB — CBC
HCT: 38.7 % — ABNORMAL LOW (ref 39.0–52.0)
Hemoglobin: 12.4 g/dL — ABNORMAL LOW (ref 13.0–17.0)
MCH: 29.5 pg (ref 26.0–34.0)
MCHC: 32 g/dL (ref 30.0–36.0)
MCV: 92.1 fL (ref 80.0–100.0)
Platelets: 256 10*3/uL (ref 150–400)
RBC: 4.2 MIL/uL — ABNORMAL LOW (ref 4.22–5.81)
RDW: 13.5 % (ref 11.5–15.5)
WBC: 11.6 10*3/uL — ABNORMAL HIGH (ref 4.0–10.5)
nRBC: 0 % (ref 0.0–0.2)

## 2022-01-10 LAB — MAGNESIUM: Magnesium: 1.7 mg/dL (ref 1.7–2.4)

## 2022-01-10 MED ORDER — ONDANSETRON HCL 4 MG/2ML IJ SOLN
4.0000 mg | Freq: Four times a day (QID) | INTRAMUSCULAR | Status: DC
  Administered 2022-01-10 – 2022-01-12 (×8): 4 mg via INTRAVENOUS
  Filled 2022-01-10 (×8): qty 2

## 2022-01-10 MED ORDER — FOLIC ACID 5 MG/ML IJ SOLN
1.0000 mg | Freq: Every day | INTRAMUSCULAR | Status: DC
  Administered 2022-01-10 – 2022-01-14 (×4): 1 mg via INTRAVENOUS
  Filled 2022-01-10 (×5): qty 0.2

## 2022-01-10 MED ORDER — SODIUM CHLORIDE 0.9 % IV SOLN: 1.0000 mg | Freq: Every day | INTRAVENOUS | Status: DC

## 2022-01-10 MED ORDER — PIPERACILLIN-TAZOBACTAM 3.375 G IVPB
3.3750 g | Freq: Three times a day (TID) | INTRAVENOUS | Status: DC
Start: 2022-01-10 — End: 2022-01-12
  Administered 2022-01-10 – 2022-01-12 (×5): 3.375 g via INTRAVENOUS
  Filled 2022-01-10 (×4): qty 50

## 2022-01-10 MED ORDER — PROSOURCE PLUS PO LIQD
30.0000 mL | Freq: Three times a day (TID) | ORAL | Status: DC
  Administered 2022-01-11 – 2022-01-14 (×8): 30 mL via ORAL
  Filled 2022-01-10 (×9): qty 30

## 2022-01-10 MED ORDER — METOCLOPRAMIDE HCL 5 MG/ML IJ SOLN
5.0000 mg | Freq: Once | INTRAMUSCULAR | Status: AC
  Administered 2022-01-10: 5 mg via INTRAVENOUS
  Filled 2022-01-10: qty 2

## 2022-01-10 MED ORDER — LACTATED RINGERS IV SOLN
INTRAVENOUS | Status: DC
Start: 2022-01-10 — End: 2022-01-14

## 2022-01-10 MED ORDER — MAGNESIUM SULFATE IN D5W 1-5 GM/100ML-% IV SOLN
1.0000 g | Freq: Once | INTRAVENOUS | Status: AC
  Administered 2022-01-10: 1 g via INTRAVENOUS
  Filled 2022-01-10: qty 100

## 2022-01-10 MED ORDER — PANTOPRAZOLE SODIUM 40 MG IV SOLR
40.0000 mg | Freq: Two times a day (BID) | INTRAVENOUS | Status: DC
  Administered 2022-01-10 – 2022-01-14 (×7): 40 mg via INTRAVENOUS
  Filled 2022-01-10 (×8): qty 10

## 2022-01-10 MED ORDER — ADULT MULTIVITAMIN W/MINERALS CH
1.0000 | ORAL_TABLET | Freq: Every day | ORAL | Status: DC
  Administered 2022-01-11 – 2022-01-14 (×4): 1 via ORAL
  Filled 2022-01-10 (×5): qty 1

## 2022-01-10 NOTE — Progress Notes (Signed)
?  ?       ?PROGRESS NOTE ? ?[name] AOZ:[id number] DOB: 1946/02/12 DOA: 01/09/2022 ?PCP: Deon Pilling, NP ? ?Brief History:  ?76 year old male with a history of hypertension, hyperlipidemia, coronary artery disease, CKD stage III, depression, pituitary macroadenoma presenting with 2 to 3-day history of abdominal pain with associated nausea and vomiting.  The patient has a history of chronic abdominal pain, but it had significantly worsened, primarily in the left-sided abdomen over the past 2 to 3 days.  There is no hematemesis.  The patient did have loose stools without any hematochezia or melena.  There is been some subjective fevers and chills.  The patient's spouse at the bedside supplements the history.  There is been no chest pain, shortness breath, coughing, hemoptysis, dysuria, hematuria.  The patient has been suffering from chronic headaches for the past 2 months. ?Regarding his headaches, the patient was admitted to [facility] from 12/27/2021 to 12/28/2021.  At that time, the patient was seen by neurology for his headaches.  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  Neurosurgery was consulted.  They recommended nonoperative management and outpatient follow-up as well as endocrine follow-up.  Ophthalmology saw the patient and noted the patient had bitemporal visual field deficits with mild disc edema.  They recommended outpatient visual field testing.  The patient did follow-up with outpatient neurology, Dr. Jaynee Eagles on 01/03/2022.  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  The patient was prescribed propranolol and Percocet.  A referral was made to [facility] endocrinology.  According to the patient's spouse, the endocrine appointment is on 03/29/2022. ? ?In the ED, the patient was afebrile hemodynamically stable with oxygen saturation 100% room air.  Sodium 140, potassium 4.4, bicarbonate 24, serum creatinine 2.71.  LFTs were  unremarkable.  Lipase 47.  WBC 16.9, hemoglobin 13.2, platelets 269,000.  CT of the brain showed chronic small vessel disease and previously seen pituitary adenoma up to 1.3 cm.  CT of the abdomen and pelvis showed acute diverticulitis of the proximal sigmoid with edema and fat stranding without abscess.  The patient was started on ceftriaxone, metronidazole, IV fluids.  He was admitted for further evaluation and treatment of his diverticulitis and renal failure.  ? ? ?Assessment and Plan: ?* Acute diverticulitis ?Start zosyn as the metronidazole may be contributing to his nausea ?Advance to full liquids ?Continue IV fluids ?judicious IV opioids ?WBC trending down ? ?Acute renal failure superimposed on stage 3b chronic kidney disease ([place]) ?Secondary to volume depletion ?Baseline creatinine 1.5-1.7 ?Patient presented with serum creatinine of 2.71 ?Continue IV fluids>>improving ?A.m. BMP ? ?Chronic daily headache ?Attributed to the patient's pituitary macroadenoma and vascular headache ?Judicious opioids--family notes he has been confused with the opioids at home ? ?Pituitary macroadenoma ([place]) ?Contributing to the patient's chronic daily headache ?Initially seen on MRI brain 12/28/2021--on impingement on optic chiasm ?Patient has outpatient neurology, neurosurgery, and endocrine appointments ?--pt has neurosurgery appointment 01/28/21 ? ?Anxiety and depression ?Continue Lexapro when able to tolerate p.o. ? ?Hyperlipidemia ?Continue statin when able to tolerate p.o. ? ?Essential hypertension ?Holding losartan secondary to AKI ?Patient has been on propanolol for his chronic headache prophylaxis ?Monitor clinically ? ? ? ? ?Family Communication:   spouse updated 5/8 ? ?Consultants:  none ? ?Code Status:  FULL  ? ?DVT Prophylaxis:  [place] Heparin  ? ? ?Procedures: ?As Listed in Progress Note Above ? ?Antibiotics: ?Ceftriaxone/flagyl 5/7>>5/8 ?Zosyn 5/8>> ? ? ? ?Subjective: ?  Patient states headache is about same as usual.   Complains of LLQ abd pain.  Pleasantly confused limiting ROS.  Denies cp, sob.   ? ?Objective: ?Vitals:  ? 01/09/22 1532 01/09/22 2005 01/10/22 0012 01/10/22 0411  ?BP: (!) 147/70 (!) 158/77 (!) 144/68 (!) 157/71  ?Pulse: 85 85 72 72  ?Resp: '18 18 18 18  '$ ?Temp: 99.1 ?F (37.3 ?C) 99.1 ?F (37.3 ?C) 98.9 ?F (37.2 ?C) 98.4 ?F (36.9 ?C)  ?TempSrc: Oral Oral Oral Oral  ?SpO2: 92% 95% 95% 95%  ?Weight:      ?Height:      ? ? ?Intake/Output Summary (Last 24 hours) at 01/10/2022 1251 ?Last data filed at 01/10/2022 3893 ?Gross per 24 hour  ?Intake 2203.27 ml  ?Output 1450 ml  ?Net 753.27 ml  ? ?Weight change:  ?Exam: ? ?General:  Pt is alert, follows commands appropriately, not in acute distress ?HEENT: No icterus, No thrush, No neck mass, [place]/AT ?Cardiovascular: RRR, S1/S2, no rubs, no gallops ?Respiratory: diminished BS, but CTA ?Abdomen: Soft/+BS, non tender, non distended, no guarding ?Extremities: No edema, No lymphangitis, No petechiae, No rashes, no synovitis ? ? ?Data Reviewed: ?I have personally reviewed following labs and imaging studies ?Basic Metabolic Panel: ?Recent Labs  ?Lab 01/03/22 ?1426 01/09/22 ?1019 01/10/22 ?0512  ?NA  --  140 143  ?K  --  4.4 4.0  ?CL  --  107 108  ?CO2  --  24 28  ?GLUCOSE  --  116* 104*  ?BUN  --  64* 45*  ?CREATININE  --  2.71* 1.83*  ?CALCIUM 10.1 8.6* 8.6*  ?MG  --   --  1.7  ? ?Liver Function Tests: ?Recent Labs  ?Lab 01/09/22 ?1019  ?AST 38  ?ALT 20  ?ALKPHOS 70  ?BILITOT 1.1  ?PROT 6.8  ?ALBUMIN 3.0*  ? ?Recent Labs  ?Lab 01/09/22 ?1019  ?LIPASE 47  ? ?No results for input(s): AMMONIA in the last 168 hours. ?Coagulation Profile: ?Recent Labs  ?Lab 01/09/22 ?1311  ?INR 1.2  ? ?CBC: ?Recent Labs  ?Lab 01/09/22 ?1019 01/10/22 ?0512  ?WBC 16.9* 11.6*  ?HGB 13.2 12.4*  ?HCT 39.6 38.7*  ?MCV 92.7 92.1  ?PLT 269 256  ? ?Cardiac Enzymes: ?No results for input(s): CKTOTAL, CKMB, CKMBINDEX, TROPONINI in the last 168 hours. ?BNP: ?Invalid input(s): POCBNP ?CBG: ?No results for input(s): GLUCAP  in the last 168 hours. ?HbA1C: ?No results for input(s): HGBA1C in the last 72 hours. ?Urine analysis: ?   ?Component Value Date/Time  ? COLORURINE YELLOW 01/09/2022 1425  ? APPEARANCEUR CLEAR 01/09/2022 1425  ? LABSPEC 1.014 01/09/2022 1425  ? PHURINE 5.0 01/09/2022 1425  ? GLUCOSEU NEGATIVE 01/09/2022 1425  ? HGBUR SMALL (A) 01/09/2022 1425  ? [place] NEGATIVE 01/09/2022 1425  ? KETONESUR 5 (A) 01/09/2022 1425  ? PROTEINUR NEGATIVE 01/09/2022 1425  ? NITRITE NEGATIVE 01/09/2022 1425  ? LEUKOCYTESUR NEGATIVE 01/09/2022 1425  ? ?Sepsis Labs: ?'@LABRCNTIP'$ (procalcitonin:4,lacticidven:4) ?) ?Recent Results (from the past 240 hour(s))  ?Resp Panel by RT-PCR (Flu A&B, Covid) Nasopharyngeal Swab     Status: None  ? Collection Time: 01/09/22 12:36 PM  ? Specimen: Nasopharyngeal Swab; Nasopharyngeal(NP) swabs in vial transport medium  ?Result Value Ref Range Status  ? SARS Coronavirus 2 by RT PCR NEGATIVE NEGATIVE Final  ?  Comment: (NOTE) ?SARS-CoV-2 target nucleic acids are NOT DETECTED. ? ?The SARS-CoV-2 RNA is generally detectable in upper respiratory ?specimens during the acute phase of infection. The lowest ?concentration of SARS-CoV-2 viral copies this assay  can detect is ?138 copies/mL. A negative result does not preclude SARS-Cov-2 ?infection and should not be used as the sole basis for treatment or ?other patient management decisions. A negative result may occur with  ?improper specimen collection/handling, submission of specimen other ?than nasopharyngeal swab, presence of viral mutation(s) within the ?areas targeted by this assay, and inadequate number of viral ?copies(<138 copies/mL). A negative result must be combined with ?clinical observations, patient history, and epidemiological ?information. The expected result is Negative. ? ?Fact Sheet for Patients:  ?EntrepreneurPulse.com.au ? ?Fact Sheet for Healthcare Providers:  ?IncredibleEmployment.be ? ?This test is no t yet  approved or cleared by the [place] FDA and  ?has been authorized for detection and/or diagnosis of SARS-CoV-2 by ?FDA under an Emergency Use Authorization (EUA). This EUA will remain  ?in effect (meaning th

## 2022-01-10 NOTE — Progress Notes (Signed)
PT Cancellation Note ? ?Patient Details ?Name: [name] ?MRN: [id number] ?DOB: 1946/07/06 ? ? ?Cancelled Treatment:    Reason Eval/Treat Not Completed: Other (comment).  Patient declined therapy due to severe nausea with vomiting - RN aware.  Will check back tomorrow. ? ? ?2:29 PM, 01/10/22 ?Lonell Grandchild, MPT ?Physical Therapist with [place] ?[facility] ?[phone] office ?[phone] mobile phone ? ?

## 2022-01-10 NOTE — Progress Notes (Signed)
Initial Nutrition Assessment ? ?DOCUMENTATION CODES:  ? ?Non-severe (moderate) malnutrition in context of acute illness/injury ? ?INTERVENTION:  ?- Boost Breeze po TID, each supplement provides 250 kcal and 9 grams of protein ? ?- 30 ml ProSource Plus TID, each supplement provides 100 kcals and 15 grams protein.  ? ?- MVI with minerals daily ? ?NUTRITION DIAGNOSIS:  ? 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 ? ?GOAL:  ? ?Patient will meet greater than or equal to 90% of their needs ? ?MONITOR:  ? ?PO intake, Supplement acceptance, Diet advancement, Labs, Weight trends ? ?REASON FOR ASSESSMENT:  ? ?Malnutrition Screening Tool ?  ? ?ASSESSMENT:  ? ?Pt admitted with 2-3 days of abdominal pain, nausea and vomiting d/t acute diverticulitis. PMH significant for HTN, HLD, CAD, CKD stage III, depression, and recent admission to [facility] 4/24-4/25 where MRI findings showed pituitary macroadenoma. ? ?Pt reports having episodes of emesis earlier today d/t ongoing headache which is noted to have been occurring for the past 2 months. He reports that he has had ongoing, intermittent nausea and vomiting within this time frame. During this time he has been unable to tolerate much PO intake. Pt states that the only food he has consumed within the last 2 months is a bit of ice cream and black coffee. Prior to this time, he was tolerating food well and eating 3 meals per day. Pt reports that he has not received Boost yet during admission. Spoke with him about the importance of drinking these as he is able to tolerate them to enhance nutritional adequacy of a clear liquid diet.  ? ?Pt reports that his usual weight is about 203 lbs and endorses weight loss with a last known weight of 175 lbs. Unfortunately there is limited documentation of recent weight history to confirm this weight loss.  Current weight noted to be 173 lbs. Will continue to monitor throughout admission.   ? ?Medications: folvite, zofran, protonix, IV abx ? ?Labs: BUN 45, Cr 1.83, GFR 38 ? ?NUTRITION - FOCUSED PHYSICAL EXAM: ? ?Flowsheet Row Most Recent Value  ?Orbital Region Mild depletion  ?Upper Arm Region Mild depletion  ?Thoracic and Lumbar Region No depletion  ?Buccal Region Mild depletion  ?Temple Region No depletion  ?Clavicle Bone Region Mild depletion  ?Clavicle and Acromion Bone Region Mild depletion  ?Scapular Bone Region No depletion  ?Dorsal Hand No depletion  ?Patellar Region Moderate depletion  ?Anterior Thigh Region Moderate depletion  ?Posterior Calf Region Mild depletion  ?Edema (RD Assessment) None  ?Hair Reviewed  ?Eyes Reviewed  ?Mouth Reviewed  ?Skin Reviewed  ?Nails Reviewed  ? ?  ? ? ?Diet Order:   ?Diet Order   ? ?       ?  Diet clear liquid Room service appropriate? Yes; Fluid consistency: Thin  Diet effective now       ?  ? ?  ?  ? ?  ? ? ?EDUCATION NEEDS:  ? ?Education needs have been addressed ? ?Skin:  Skin Assessment: Reviewed RN Assessment ? ?Last BM:  5/6 (type 4) ? ?Height:  ? ?Ht Readings from Last 1 Encounters:  ?01/09/22 '5\' 8"'$  (1.727 m)  ? ? ?Weight:  ? ?Wt Readings from Last 1 Encounters:  ?01/09/22 78.5 kg  ? ?BMI:  Body mass index is 26.3 kg/m?. ? ?Estimated Nutritional Needs:  ? ?Kcal:  2000-2200 ? ?Protein:  100-115g ? ?Fluid:  >/=2L ? ?Clayborne Dana, RDN, LDN ?Clinical Nutrition ?

## 2022-01-10 NOTE — Progress Notes (Signed)
?  Transition of Care (TOC) Screening Note ? ? ?Patient Details  ?Name: RENNE PLATTS ?Date of Birth: 1946-07-27 ? ? ?Transition of Care (TOC) CM/SW Contact:    ?Iona Beard, LCSWA ?Phone Number: ?01/10/2022, 10:52 AM ? ? ? ?Transition of Care Department [facility]) has reviewed patient and no TOC needs have been identified at this time. We will continue to monitor patient advancement through interdisciplinary progression rounds. If new patient transition needs arise, please place a TOC consult. ?  ?

## 2022-01-11 ENCOUNTER — Encounter: Payer: Self-pay | Admitting: Neurology

## 2022-01-11 DIAGNOSIS — G9341 Metabolic encephalopathy: Secondary | ICD-10-CM | POA: Diagnosis not present

## 2022-01-11 DIAGNOSIS — K5792 Diverticulitis of intestine, part unspecified, without perforation or abscess without bleeding: Secondary | ICD-10-CM | POA: Diagnosis not present

## 2022-01-11 DIAGNOSIS — N1832 Chronic kidney disease, stage 3b: Secondary | ICD-10-CM | POA: Diagnosis not present

## 2022-01-11 DIAGNOSIS — N179 Acute kidney failure, unspecified: Secondary | ICD-10-CM | POA: Diagnosis not present

## 2022-01-11 DIAGNOSIS — E440 Moderate protein-calorie malnutrition: Secondary | ICD-10-CM | POA: Diagnosis present

## 2022-01-11 DIAGNOSIS — G43901 Migraine, unspecified, not intractable, with status migrainosus: Secondary | ICD-10-CM | POA: Diagnosis not present

## 2022-01-11 LAB — CBC
HCT: 39.8 % (ref 39.0–52.0)
Hemoglobin: 13 g/dL (ref 13.0–17.0)
MCH: 29.6 pg (ref 26.0–34.0)
MCHC: 32.7 g/dL (ref 30.0–36.0)
MCV: 90.7 fL (ref 80.0–100.0)
Platelets: 281 10*3/uL (ref 150–400)
RBC: 4.39 MIL/uL (ref 4.22–5.81)
RDW: 13.2 % (ref 11.5–15.5)
WBC: 10.8 10*3/uL — ABNORMAL HIGH (ref 4.0–10.5)
nRBC: 0 % (ref 0.0–0.2)

## 2022-01-11 LAB — BASIC METABOLIC PANEL
Anion gap: 11 (ref 5–15)
BUN: 32 mg/dL — ABNORMAL HIGH (ref 8–23)
CO2: 27 mmol/L (ref 22–32)
Calcium: 8.6 mg/dL — ABNORMAL LOW (ref 8.9–10.3)
Chloride: 106 mmol/L (ref 98–111)
Creatinine, Ser: 1.59 mg/dL — ABNORMAL HIGH (ref 0.61–1.24)
GFR, Estimated: 44 mL/min — ABNORMAL LOW (ref >60)
Glucose, Bld: 96 mg/dL (ref 70–99)
Potassium: 3.9 mmol/L (ref 3.5–5.1)
Sodium: 144 mmol/L (ref 135–145)

## 2022-01-11 LAB — CORTISOL-AM, BLOOD: Cortisol - AM: 11.4 ug/dL (ref 6.7–22.6)

## 2022-01-11 LAB — FOLATE: Folate: 38.4 ng/mL (ref >5.9)

## 2022-01-11 LAB — AMMONIA: Ammonia: 14 umol/L (ref 9–35)

## 2022-01-11 LAB — MAGNESIUM: Magnesium: 1.9 mg/dL (ref 1.7–2.4)

## 2022-01-11 LAB — VITAMIN B12: Vitamin B-12: 754 pg/mL (ref 180–914)

## 2022-01-11 MED ORDER — HALOPERIDOL LACTATE 5 MG/ML IJ SOLN: 2.0000 mg | Freq: Four times a day (QID) | INTRAMUSCULAR | Status: DC | PRN

## 2022-01-11 NOTE — Assessment & Plan Note (Signed)
Continue supplements

## 2022-01-11 NOTE — Assessment & Plan Note (Addendum)
Multifactorial including opioids, infectious process, renal failure ?--overall improving, near baseline on 5/9 ?--minimize hypnotic/opioid meds ?--discharge home today with home health  ?

## 2022-01-11 NOTE — Plan of Care (Signed)

## 2022-01-11 NOTE — Progress Notes (Signed)
?  ?       ?PROGRESS NOTE ? ?PERL KERNEY ZES:[id number] DOB: October 03, 1945 DOA: 01/09/2022 ?PCP: Deon Pilling, NP ? ?Brief History:  ?76 year old male with a history of hypertension, hyperlipidemia, coronary artery disease, CKD stage III, depression, pituitary macroadenoma presenting with 2 to 3-day history of abdominal pain with associated nausea and vomiting.  The patient has a history of chronic abdominal pain, but it had significantly worsened, primarily in the left-sided abdomen over the past 2 to 3 days.  There is no hematemesis.  The patient did have loose stools without any hematochezia or melena.  There is been some subjective fevers and chills.  The patient's spouse at the bedside supplements the history.  There is been no chest pain, shortness breath, coughing, hemoptysis, dysuria, hematuria.  The patient has been suffering from chronic headaches for the past 2 months. ?Regarding his headaches, the patient was admitted to [facility] from 12/27/2021 to 12/28/2021.  At that time, the patient was seen by neurology for his headaches.  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  Neurosurgery was consulted.  They recommended nonoperative management and outpatient follow-up as well as endocrine follow-up.  Ophthalmology saw the patient and noted the patient had bitemporal visual field deficits with mild disc edema.  They recommended outpatient visual field testing.  The patient did follow-up with outpatient neurology, Dr. Jaynee Eagles on 01/03/2022.  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  The patient was prescribed propranolol and Percocet.  A referral was made to [facility] endocrinology.  According to the patient's spouse, the endocrine appointment is on 03/29/2022. ? ?In the ED, the patient was afebrile hemodynamically stable with oxygen saturation 100% room air.  Sodium 140, potassium 4.4, bicarbonate 24, serum creatinine 2.71.  LFTs were  unremarkable.  Lipase 47.  WBC 16.9, hemoglobin 13.2, platelets 269,000.  CT of the brain showed chronic small vessel disease and previously seen pituitary adenoma up to 1.3 cm.  CT of the abdomen and pelvis showed acute diverticulitis of the proximal sigmoid with edema and fat stranding without abscess.  The patient was started on ceftriaxone, metronidazole, IV fluids.  He was admitted for further evaluation and treatment of his diverticulitis and renal failure.  ? ? ?Assessment and Plan: ?* Acute diverticulitis ?Start zosyn as the metronidazole may be contributing to his nausea--this has helped ?Advance to soft diet ?Continue IV fluids ?judicious IV opioids ?WBC trending down; abdominal pain improving ? ?Acute metabolic encephalopathy ?Multifactorial including opioids, infectious process, renal failure ?--overall improving, near baseline on 5/9 ?--minimize hypnotic/opioid meds ? ?Malnutrition of moderate degree ?Continue supplements ? ?Acute renal failure superimposed on stage 3b chronic kidney disease ([place]) ?Secondary to volume depletion ?Baseline creatinine 1.5-1.7 ?Patient presented with serum creatinine of 2.71 ?Continue IV fluids>>improving ?A.m. BMP ? ?Chronic daily headache ?Attributed to the patient's pituitary macroadenoma and vascular headache ?Judicious opioids--family notes he has been confused with the opioids at home ? ?Pituitary macroadenoma ([place]) ?Contributing to the patient's chronic daily headache ?Initially seen on MRI brain 12/28/2021--on impingement on optic chiasm ?Patient has outpatient neurology, neurosurgery, and endocrine appointments ?--pt has neurosurgery appointment 01/28/21 ?--I've made referral to [place] ? ?Anxiety and depression ?Continue Lexapro when able to tolerate p.o. ? ?Hyperlipidemia ?Continue statin when able to tolerate p.o. ? ?Essential hypertension ?Holding losartan secondary to AKI ?Patient has been on propanolol for his chronic headache prophylaxis ?Monitor  clinically ? ? ? ? ?Family Communication:  spouse updated 5/9 ? ?Consultants:  none ? ?Code Status:  FULL ? ?DVT Prophylaxis:  [place] Heparin ? ? ?Procedures: ?As Listed in Progress Note Above ? ?Antibiotics: ?Ceftriaxone/flagyl 5/7>>5/8 ?Zosyn 5/8>> ?  ?  ? ? ? ? ?Subjective: ?Patient feels hungry, wants solid food.  Denies cp, sob, n/v/d.  Abd pain is improving ? ?Objective: ?Vitals:  ? 01/10/22 0012 01/10/22 0411 01/11/22 0524 01/11/22 1500  ?BP: (!) 144/68 (!) 157/71 (!) 197/80 (!) 163/64  ?Pulse: 72 72 68 65  ?Resp: '18 18 19 18  '$ ?Temp: 98.9 ?F (37.2 ?C) 98.4 ?F (36.9 ?C) 98.1 ?F (36.7 ?C) 99.1 ?F (37.3 ?C)  ?TempSrc: Oral Oral Oral Oral  ?SpO2: 95% 95% 99% 96%  ?Weight:      ?Height:      ? ? ?Intake/Output Summary (Last 24 hours) at 01/11/2022 1740 ?Last data filed at 01/11/2022 1600 ?Gross per 24 hour  ?Intake 625.54 ml  ?Output --  ?Net 625.54 ml  ? ?Weight change:  ?Exam: ? ?General:  Pt is alert, follows commands appropriately, not in acute distress ?HEENT: No icterus, No thrush, No neck mass, [place]/AT ?Cardiovascular: RRR, S1/S2, no rubs, no gallops ?Respiratory: diminished BS at bases, but CTA bilaterally, no wheezing, no crackles, no rhonchi ?Abdomen: Soft/+BS, mild LLQ tender, non distended, no guarding ?Extremities: No edema, No lymphangitis, No petechiae, No rashes, no synovitis ? ? ?Data Reviewed: ?I have personally reviewed following labs and imaging studies ?Basic Metabolic Panel: ?Recent Labs  ?Lab 01/09/22 ?1019 01/10/22 ?4196 01/11/22 ?2229  ?NA 140 143 144  ?K 4.4 4.0 3.9  ?CL 107 108 106  ?CO2 '24 28 27  '$ ?GLUCOSE 116* 104* 96  ?BUN 64* 45* 32*  ?CREATININE 2.71* 1.83* 1.59*  ?CALCIUM 8.6* 8.6* 8.6*  ?MG  --  1.7 1.9  ? ?Liver Function Tests: ?Recent Labs  ?Lab 01/09/22 ?1019  ?AST 38  ?ALT 20  ?ALKPHOS 70  ?BILITOT 1.1  ?PROT 6.8  ?ALBUMIN 3.0*  ? ?Recent Labs  ?Lab 01/09/22 ?1019  ?LIPASE 47  ? ?Recent Labs  ?Lab 01/11/22 ?7989  ?AMMONIA 14  ? ?Coagulation Profile: ?Recent Labs  ?Lab 01/09/22 ?1311   ?INR 1.2  ? ?CBC: ?Recent Labs  ?Lab 01/09/22 ?1019 01/10/22 ?2119 01/11/22 ?4174  ?WBC 16.9* 11.6* 10.8*  ?HGB 13.2 12.4* 13.0  ?HCT 39.6 38.7* 39.8  ?MCV 92.7 92.1 90.7  ?PLT 269 256 281  ? ?Cardiac Enzymes: ?No results for input(s): CKTOTAL, CKMB, CKMBINDEX, TROPONINI in the last 168 hours. ?BNP: ?Invalid input(s): POCBNP ?CBG: ?No results for input(s): GLUCAP in the last 168 hours. ?HbA1C: ?No results for input(s): HGBA1C in the last 72 hours. ?Urine analysis: ?   ?Component Value Date/Time  ? COLORURINE YELLOW 01/09/2022 1425  ? APPEARANCEUR CLEAR 01/09/2022 1425  ? LABSPEC 1.014 01/09/2022 1425  ? PHURINE 5.0 01/09/2022 1425  ? GLUCOSEU NEGATIVE 01/09/2022 1425  ? HGBUR SMALL (A) 01/09/2022 1425  ? [place] NEGATIVE 01/09/2022 1425  ? KETONESUR 5 (A) 01/09/2022 1425  ? PROTEINUR NEGATIVE 01/09/2022 1425  ? NITRITE NEGATIVE 01/09/2022 1425  ? LEUKOCYTESUR NEGATIVE 01/09/2022 1425  ? ?Sepsis Labs: ?'@LABRCNTIP'$ (procalcitonin:4,lacticidven:4) ?) ?Recent Results (from the past 240 hour(s))  ?Resp Panel by RT-PCR (Flu A&B, Covid) Nasopharyngeal Swab     Status: None  ? Collection Time: 01/09/22 12:36 PM  ? Specimen: Nasopharyngeal Swab; Nasopharyngeal(NP) swabs in vial transport medium  ?Result Value Ref Range Status  ? SARS Coronavirus 2 by RT PCR NEGATIVE NEGATIVE Final  ?  Comment: (NOTE) ?  SARS-CoV-2 target nucleic acids are NOT DETECTED. ? ?The SARS-CoV-2 RNA is generally detectable in upper respiratory ?specimens during the acute phase of infection. The lowest ?concentration of SARS-CoV-2 viral copies this assay can detect is ?138 copies/mL. A negative result does not preclude SARS-Cov-2 ?infection and should not be used as the sole basis for treatment or ?other patient management decisions. A negative result may occur with  ?improper specimen collection/handling, submission of specimen other ?than nasopharyngeal swab, presence of viral mutation(s) within the ?areas targeted by this assay, and inadequate  number of viral ?copies(<138 copies/mL). A negative result must be combined with ?clinical observations, patient history, and epidemiological ?information. The expected result is Negative. ? ?Fact Sheet for Patient

## 2022-01-11 NOTE — Progress Notes (Signed)
Pt has been more alert in the AM.  Pt was able to ambulate with walker and assistance to restroom. Pt has also been more confused and agitated than before but not combative. He thinks he's in a hotel and has gotten out of bed on his own. MD made aware. Will continue to monitor pt. ?

## 2022-01-11 NOTE — Plan of Care (Signed)
?  Problem: Acute Rehab PT Goals(only PT should resolve) ?Goal: Pt Will Go Supine/Side To Sit ?Outcome: Progressing ?Flowsheets (Taken 01/11/2022 1209) ?Pt will go Supine/Side to Sit: ? Independently ? with modified independence ?Goal: Patient Will Transfer Sit To/From Stand ?Outcome: Progressing ?Flowsheets (Taken 01/11/2022 1209) ?Patient will transfer sit to/from stand: ? with modified independence ? with supervision ?Goal: Pt Will Transfer Bed To Chair/Chair To Bed ?Outcome: Progressing ?Flowsheets (Taken 01/11/2022 1209) ?Pt will Transfer Bed to Chair/Chair to Bed: ? with modified independence ? with supervision ?Goal: Pt Will Ambulate ?Outcome: Progressing ?Flowsheets (Taken 01/11/2022 1209) ?Pt will Ambulate: ? 100 feet ? with supervision ? with rolling walker ? with modified independence ?  ?12:09 PM, 01/11/22 ?Lonell Grandchild, MPT ?Physical Therapist with [place] ?[facility] ?[phone] office ?[phone] mobile phone ? ?

## 2022-01-11 NOTE — Evaluation (Signed)
Physical Therapy Evaluation ?Patient Details ?Name: [name] ?MRN: [id number] ?DOB: [birth year] ?Today's Date: 01/11/2022 ? ?History of Present Illness ? [name] is a 76 year old male with a history of hypertension, hyperlipidemia, coronary artery disease, CKD stage III, depression, pituitary macroadenoma presenting with 2 to 3-day history of abdominal pain with associated nausea and vomiting.  The patient has a history of chronic abdominal pain, but it had significantly worsened, primarily in the left-sided abdomen over the past 2 to 3 days.  There is no hematemesis.  The patient did have loose stools without any hematochezia or melena.  There is been some subjective fevers and chills.  The patient's spouse at the bedside supplements the history.  There is been no chest pain, shortness breath, coughing, hemoptysis, dysuria, hematuria.  The patient has been suffering from chronic headaches for the past 2 months.  Regarding his headaches, the patient was admitted to [facility] from 12/27/2021 to 12/28/2021.  At that time, the patient was seen by neurology for his headaches.  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  Neurosurgery was consulted.  They recommended nonoperative management and outpatient follow-up as well as endocrine follow-up.  Ophthalmology saw the patient and noted the patient had bitemporal visual field deficits with mild disc edema.  They recommended outpatient visual field testing.  The patient did follow-up with outpatient neurology, Dr. Jaynee Eagles on 01/03/2022.  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  The patient was prescribed propranolol and Percocet.  A referral was made to [facility]ocrinology.  According to the patient's spouse, the endocrine appointment is on 03/29/2022. ?  ?Clinical Impression ? Patient unsteady on feet with near loss of balance completing transfers without AD, required use of RW for safety  demonstrating slow slightly labored cadence ambulating in room/hallway without loss of balance and limited mostly due to fatigue.  Patient tolerated staying up in chair after therapy - nursing staff aware.  Patient will benefit from continued skilled physical therapy in hospital and recommended venue below to increase strength, balance, endurance for safe ADLs and gait.  ? ?   ? ?Recommendations for follow up therapy are one component of a multi-disciplinary discharge planning process, led by the attending physician.  Recommendations may be updated based on patient status, additional functional criteria and insurance authorization. ? ?Follow Up Recommendations Home health PT ? ?  ?Assistance Recommended at Discharge Intermittent Supervision/Assistance  ?Patient can return home with the following ? A little help with walking and/or transfers;A little help with bathing/dressing/bathroom;Help with stairs or ramp for entrance;Assistance with cooking/housework ? ?  ?Equipment Recommendations None recommended by PT  ?Recommendations for Other Services ?    ?  ?Functional Status Assessment Patient has had a recent decline in their functional status and demonstrates the ability to make significant improvements in function in a reasonable and predictable amount of time.  ? ?  ?Precautions / Restrictions Precautions ?Precautions: Fall ?Restrictions ?Weight Bearing Restrictions: No  ? ?  ? ?Mobility ? Bed Mobility ?Overal bed mobility: Modified Independent ?  ?  ?  ?  ?  ?  ?  ?  ? ?Transfers ?Overall transfer level: Needs assistance ?Equipment used: Rolling walker (2 wheels), None ?Transfers: Sit to/from Stand, Bed to chair/wheelchair/BSC ?Sit to Stand: Min guard ?  ?Step pivot transfers: Min guard, Min assist ?  ?  ?  ?General transfer comment: very unsteady on feet with near loss of balance without AD,  required use of RW for safety ?  ? ?Ambulation/Gait ?Ambulation/Gait assistance: Min guard ?Gait Distance (Feet): 55  Feet ?Assistive device: Rolling walker (2 wheels) ?Gait Pattern/deviations: Decreased step length - right, Decreased step length - left, Decreased stride length ?Gait velocity: decreased ?  ?  ?General Gait Details: slow slightly labored cadence without loss of balance, limited mostly due to c/o fatigue ? ?Stairs ?  ?  ?  ?  ?  ? ?Wheelchair Mobility ?  ? ?Modified Rankin (Stroke Patients Only) ?  ? ?  ? ?Balance Overall balance assessment: Needs assistance ?Sitting-balance support: Feet supported, No upper extremity supported ?Sitting balance-Leahy Scale: Good ?Sitting balance - Comments: seated at EOB ?  ?Standing balance support: During functional activity, No upper extremity supported ?Standing balance-Leahy Scale: Poor ?Standing balance comment: fair using RW ?  ?  ?  ?  ?  ?  ?  ?  ?  ?  ?  ?   ? ? ? ?Pertinent Vitals/Pain Pain Assessment ?Pain Assessment: No/denies pain  ? ? ?Home Living Family/patient expects to be discharged to:: Private residence ?Living Arrangements: Spouse/significant other ?Available Help at Discharge: Family;Available PRN/intermittently ?Type of Home: House ?Home Access: Stairs to enter ?Entrance Stairs-Rails: Right ?Entrance Stairs-Number of Steps: 3-4 ?  ?Home Layout: One level ?Home Equipment: Conservation officer, nature (2 wheels);Cane - single point;Shower seat ?   ?  ?Prior Function Prior Level of Function : Independent/Modified Independent;Driving ?  ?  ?  ?  ?  ?  ?Mobility Comments: Community ambulator without AD, was driving until recently ?ADLs Comments: Independent ?  ? ? ?Hand Dominance  ? Dominant Hand: Right ? ?  ?Extremity/Trunk Assessment  ? Upper Extremity Assessment ?Upper Extremity Assessment: Overall WFL for tasks assessed ?  ? ?Lower Extremity Assessment ?Lower Extremity Assessment: Generalized weakness ?  ? ?Cervical / Trunk Assessment ?Cervical / Trunk Assessment: Normal  ?Communication  ? Communication: No difficulties  ?Cognition Arousal/Alertness: Awake/alert ?Behavior  During Therapy: [facility] for tasks assessed/performed ?Overall Cognitive Status: Within Functional Limits for tasks assessed ?  ?  ?  ?  ?  ?  ?  ?  ?  ?  ?  ?  ?  ?  ?  ?  ?  ?  ?  ? ?  ?General Comments   ? ?  ?Exercises    ? ?Assessment/Plan  ?  ?PT Assessment Patient needs continued PT services  ?PT Problem List Decreased strength;Decreased activity tolerance;Decreased balance;Decreased mobility ? ?   ?  ?PT Treatment Interventions DME instruction;Gait training;Stair training;Functional mobility training;Therapeutic activities;Therapeutic exercise;Balance training;Patient/family education   ? ?PT Goals (Current goals can be found in the Care Plan section)  ?Acute Rehab PT Goals ?Patient Stated Goal: return home with family to assist ?PT Goal Formulation: With patient ?Time For Goal Achievement: 01/18/22 ?Potential to Achieve Goals: Good ? ?  ?Frequency Min 3X/week ?  ? ? ?Co-evaluation   ?  ?  ?  ?  ? ? ?  ?AM-PAC PT "6 Clicks" Mobility  ?Outcome Measure Help needed turning from your back to your side while in a flat bed without using bedrails?: None ?Help needed moving from lying on your back to sitting on the side of a flat bed without using bedrails?: None ?Help needed moving to and from a bed to a chair (including a wheelchair)?: A Little ?Help needed standing up from a chair using your arms (e.g., wheelchair or bedside chair)?: A Little ?Help needed to walk in hospital  room?: A Little ?Help needed climbing 3-5 steps with a railing? : A Little ?6 Click Score: 20 ? ?  ?End of Session   ?Activity Tolerance: Patient tolerated treatment well;Patient limited by fatigue ?Patient left: in chair;with call bell/phone within reach;with chair alarm set ?Nurse Communication: Mobility status ?PT Visit Diagnosis: Unsteadiness on feet (R26.81);Other abnormalities of gait and mobility (R26.89);Muscle weakness (generalized) (M62.81) ?  ? ?Time: 2518-9842 ?PT Time Calculation (min) (ACUTE ONLY): 16 min ? ? ?Charges:   PT  Evaluation ?$PT Eval Low Complexity: 1 Low ?PT Treatments ?$Therapeutic Activity: 8-22 mins ?  ?   ? ? ?12:08 PM, 01/11/22 ?Lonell Grandchild, MPT ?Physical Therapist with [place] ?[facility] ?[phone] off

## 2022-01-12 ENCOUNTER — Encounter (HOSPITAL_COMMUNITY): Payer: Self-pay | Admitting: Internal Medicine

## 2022-01-12 DIAGNOSIS — K5792 Diverticulitis of intestine, part unspecified, without perforation or abscess without bleeding: Secondary | ICD-10-CM | POA: Diagnosis not present

## 2022-01-12 DIAGNOSIS — G9341 Metabolic encephalopathy: Secondary | ICD-10-CM | POA: Diagnosis not present

## 2022-01-12 DIAGNOSIS — E785 Hyperlipidemia, unspecified: Secondary | ICD-10-CM

## 2022-01-12 DIAGNOSIS — I10 Essential (primary) hypertension: Secondary | ICD-10-CM | POA: Diagnosis not present

## 2022-01-12 DIAGNOSIS — N179 Acute kidney failure, unspecified: Secondary | ICD-10-CM | POA: Diagnosis not present

## 2022-01-12 LAB — CBC
HCT: 38.8 % — ABNORMAL LOW (ref 39.0–52.0)
Hemoglobin: 13 g/dL (ref 13.0–17.0)
MCH: 30 pg (ref 26.0–34.0)
MCHC: 33.5 g/dL (ref 30.0–36.0)
MCV: 89.4 fL (ref 80.0–100.0)
Platelets: 276 10*3/uL (ref 150–400)
RBC: 4.34 MIL/uL (ref 4.22–5.81)
RDW: 13.1 % (ref 11.5–15.5)
WBC: 10 10*3/uL (ref 4.0–10.5)
nRBC: 0 % (ref 0.0–0.2)

## 2022-01-12 LAB — BASIC METABOLIC PANEL
Anion gap: 10 (ref 5–15)
BUN: 27 mg/dL — ABNORMAL HIGH (ref 8–23)
CO2: 27 mmol/L (ref 22–32)
Calcium: 8.3 mg/dL — ABNORMAL LOW (ref 8.9–10.3)
Chloride: 103 mmol/L (ref 98–111)
Creatinine, Ser: 1.6 mg/dL — ABNORMAL HIGH (ref 0.61–1.24)
GFR, Estimated: 44 mL/min — ABNORMAL LOW (ref >60)
Glucose, Bld: 94 mg/dL (ref 70–99)
Potassium: 3.4 mmol/L — ABNORMAL LOW (ref 3.5–5.1)
Sodium: 140 mmol/L (ref 135–145)

## 2022-01-12 LAB — MAGNESIUM: Magnesium: 1.7 mg/dL (ref 1.7–2.4)

## 2022-01-12 MED ORDER — AMOXICILLIN-POT CLAVULANATE 875-125 MG PO TABS: 1.0000 | ORAL_TABLET | Freq: Two times a day (BID) | ORAL | 0 refills | Status: AC

## 2022-01-12 MED ORDER — AMOXICILLIN-POT CLAVULANATE 875-125 MG PO TABS
1.0000 | ORAL_TABLET | Freq: Two times a day (BID) | ORAL | Status: DC
  Administered 2022-01-12 – 2022-01-13 (×2): 1 via ORAL
  Filled 2022-01-12 (×2): qty 1

## 2022-01-12 MED ORDER — HYDRALAZINE HCL 20 MG/ML IJ SOLN: 10.0000 mg | Freq: Four times a day (QID) | INTRAMUSCULAR | Status: DC | PRN

## 2022-01-12 MED ORDER — LOSARTAN POTASSIUM 50 MG PO TABS
25.0000 mg | ORAL_TABLET | Freq: Every day | ORAL | Status: DC
  Administered 2022-01-12: 25 mg via ORAL
  Filled 2022-01-12 (×2): qty 1

## 2022-01-12 NOTE — Plan of Care (Signed)

## 2022-01-12 NOTE — TOC Transition Note (Signed)
Transition of Care (TOC) - CM/SW Discharge Note ? ? ?Patient Details  ?Name: [name] ?MRN: [id number] ?Date of Birth: 1946/01/20 ? ?Transition of Care (TOC) CM/SW Contact:  ?Shade Flood, LCSW ?Phone Number: ?01/12/2022, 12:31 PM ? ? ?Clinical Narrative:    ? ?Pt stable for dc today per MD. Referred to [facility] for HHPT and pt is accepted. [facility] will follow at home. Information added to pt's AVS.   ? ?There are no other TOC needs identified for dc. ? ?Final next level of care: [place] ?Barriers to Discharge: Barriers Resolved ? ? ?Patient Goals and CMS Choice ?  ?  ?  ? ?Discharge Placement ?  ?           ?  ?  ?  ?  ? ?Discharge Plan and Services ?  ?  ?           ?  ?  ?  ?  ?  ?HH Arranged: PT ?[place] Agency: [place] ?Date HH Agency Contacted: 01/12/22 ?  ?Representative spoke with at [place]: Marjory Lies ? ?Social Determinants of Health (SDOH) Interventions ?  ? ? ?Readmission Risk Interventions ?   ? View : No data to display.  ?  ?  ?  ? ? ? ? ? ?

## 2022-01-12 NOTE — Care Management Important Message (Signed)
Important Message ? ?Patient Details  ?Name: [name] ?MRN: [id number] ?Date of Birth: [birth year] ? ? ?Medicare Important Message Given:  Yes ? ? ? ? ?Tommy Medal ?01/12/2022, 10:52 AM ?

## 2022-01-12 NOTE — [place] FL2 (Signed)
?[place] MEDICAID FL2 LEVEL OF CARE SCREENING TOOL  ?  ? ?IDENTIFICATION  ?Patient Name: ?[name] Birthdate: Jun 24, 1946 Sex: male Admission Date (Current Location): ?01/09/2022  ?[place] and [place] Number: ? [place] and Address:  ?[facility] [address] [place] ?     Provider Number: ?[id number]  ?Attending Physician Name and Address:  ?Murlean Iba, MD ? Relative Name and Phone Number:  ?  ?   ?Current Level of Care: ?Hospital Recommended Level of Care: ?[place] Prior Approval Number: ?  ? ?Date Approved/Denied: ?  PASRR Number: ?  ? ?Discharge Plan: ?SNF ?  ? ?Current Diagnoses: ?Patient Active Problem List  ? Diagnosis Date Noted  ? Malnutrition of moderate degree 01/11/2022  ? Acute metabolic encephalopathy 17/51/0258  ? Acute diverticulitis 01/09/2022  ? Acute renal failure superimposed on stage 3b chronic kidney disease ([place]) 01/09/2022  ? 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 Chronic daily headache 01/03/2022  ? Altered mental status 12/27/2021  ? Headache 12/27/2021  ? Periorbital pain, bilateral 12/27/2021  ? Anxiety and depression 12/27/2021  ? Chronic gouty arthritis 04/23/2021  ? Chronic pain 04/23/2021  ? Essential hypertension 04/23/2021  ? Fatigue 04/23/2021  ? Hyperlipidemia 04/23/2021  ? Insomnia 04/23/2021  ? Rheumatoid arthritis ([place]) 04/23/2021  ? Intermediate stage nonexudative age-related macular degeneration of both eyes 07/22/2020  ? Posterior vitreous detachment of right eye 07/22/2020  ? Vitreomacular adhesion of left eye 07/22/2020  ? Cholelithiasis with chronic cholecystitis 06/05/2017  ? Umbilical hernia 52/77/8242  ? Cholelithiasis 05/09/2017  ? GERD 10/15/2010  ? NAUSEA WITH VOMITING 10/15/2010  ? COLONIC POLYPS 11/25/2008  ? GASTRIC ULCER 11/25/2008  ? DUODENAL ULCER 11/25/2008  ? DIVERTICULOSIS, COLON 11/25/2008  ? ARTHRITIS, RHEUMATOID 11/25/2008  ? NAUSEA 11/25/2008  ? ABDOMINAL PAIN-EPIGASTRIC  11/25/2008  ? ABDOMINAL PAIN -GENERALIZED 11/25/2008  ? ? ?Orientation RESPIRATION BLADDER Height & Weight   ?  ?Self, Situation, Place ? Normal Incontinent Weight: 173 lb (78.5 kg) ?Height:  '5\' 8"'$  (172.7 cm)  ?BEHAVIORAL SYMPTOMS/MOOD NEUROLOGICAL BOWEL NUTRITION STATUS  ?    Continent Diet (see dc summary)  ?AMBULATORY STATUS COMMUNICATION OF NEEDS Skin   ?Extensive Assist Verbally Normal ?  ?  ?  ?    ?     ?     ? ? ?Personal Care Assistance Level of Assistance  ?Bathing, Feeding, Dressing Bathing Assistance: Limited assistance ?Feeding assistance: Independent ?Dressing Assistance: Limited assistance ?   ? ?Functional Limitations Info  ?Sight, Hearing, Speech Sight Info: Adequate ?Hearing Info: Adequate ?Speech Info: Adequate  ? ? ?SPECIAL CARE FACTORS FREQUENCY  ?PT (By licensed PT), OT (By licensed OT)   ?  ?PT Frequency: 5x week ?OT Frequency: 3x week ?  ?  ?  ?   ? ? ?Contractures Contractures Info: Not present  ? ? ?Additional Factors Info  ?Code Status, Allergies, Psychotropic Code Status Info: Full ?Allergies Info: NKA ?Psychotropic Info: Lexapro ?  ?  ?   ? ?Current Medications (01/12/2022):  This is the current hospital active medication list ?Current Facility-Administered Medications  ?Medication Dose Route Frequency Provider Last Rate Last Admin  ? (feeding supplement) PROSource Plus liquid 30 mL  30 mL Oral TID BM Tat, David, MD   30 mL at 01/12/22 1450  ? acetaminophen (TYLENOL) tablet 650 mg  650 mg Oral Q6H PRN Orson Eva, MD   650 mg at 01/12/22 1332  ? Or  ? acetaminophen (TYLENOL) suppository  650 mg  650 mg Rectal Q6H PRN Tat, Shanon Brow, MD      ? allopurinol (ZYLOPRIM) tablet 300 mg  300 mg Oral Daily Tat, David, MD   300 mg at 01/12/22 0827  ? escitalopram (LEXAPRO) tablet 20 mg  20 mg Oral Benay Pike, MD   20 mg at 01/11/22 2250  ? feeding supplement (BOOST / RESOURCE BREEZE) liquid 1 Container  1 Container Oral TID BM Orson Eva, MD   1 Container at 01/12/22 1450  ? folic acid injection 1 mg   1 mg Intravenous Daily Tat, David, MD   1 mg at 01/12/22 1011  ? haloperidol lactate (HALDOL) injection 2 mg  2 mg Intravenous Q6H PRN Tat, David, MD      ? heparin injection 5,000 Units  5,000 Units Subcutaneous Franco Collet, MD   5,000 Units at 01/12/22 0827  ? hydrALAZINE (APRESOLINE) injection 10 mg  10 mg Intravenous Q6H PRN Adefeso, Oladapo, DO      ? lactated ringers infusion   Intravenous Continuous Johnson, Clanford L, MD 10 mL/hr at 01/12/22 1012 Rate Change at 01/12/22 1012  ? losartan (COZAAR) tablet 25 mg  25 mg Oral Daily Johnson, Clanford L, MD   25 mg at 01/12/22 9381  ? multivitamin with minerals tablet 1 tablet  1 tablet Oral Daily Tat, Shanon Brow, MD   1 tablet at 01/12/22 0827  ? ondansetron (ZOFRAN) tablet 4 mg  4 mg Oral Q6H PRN Tat, David, MD      ? Or  ? ondansetron (ZOFRAN) injection 4 mg  4 mg Intravenous Q6H PRN Orson Eva, MD   4 mg at 01/10/22 1329  ? ondansetron (ZOFRAN) injection 4 mg  4 mg Intravenous Q6H Orson Eva, MD   4 mg at 01/12/22 1332  ? pantoprazole (PROTONIX) injection 40 mg  40 mg Intravenous Therisa Doyne, MD   40 mg at 01/12/22 0827  ? piperacillin-tazobactam (ZOSYN) IVPB 3.375 g  3.375 g Intravenous Franco Collet, MD 12.5 mL/hr at 01/12/22 0617 3.375 g at 01/12/22 0617  ? pravastatin (PRAVACHOL) tablet 40 mg  40 mg Oral Daily Tat, David, MD   40 mg at 01/12/22 0827  ? propranolol ER (INDERAL LA) 24 hr capsule 60 mg  60 mg Oral Daily Tat, David, MD   60 mg at 01/12/22 0827  ? traZODone (DESYREL) tablet 50 mg  50 mg Oral QHS PRN Orson Eva, MD   50 mg at 01/09/22 2209  ? ? ? ?Discharge Medications: ?Please see discharge summary for a list of discharge medications. ? ?Relevant Imaging Results: ? ?Relevant Lab Results: ? ? ?Additional Information ?SSN: [id number] ? ?Shade Flood, LCSW ? ? ? ? ?

## 2022-01-12 NOTE — Progress Notes (Signed)
Pt assisted x2 back to bed. Very drowsy, shuffling gait. Pt denies c/o pain, refused food from supper tray, did drink Boost supplement. Pt remains oriented to person only. Condom cath intact draining clear yellow urine. Bed alarm on and tele sitter in place for safety. Call bell within reach. ?

## 2022-01-12 NOTE — Discharge Instructions (Addendum)
SOFT FOODS DIET RECOMMENDED FOR NEXT 1-2 WEEKS ?PLEASE FOLLOW UP WITH NEUROSURGERY AS SCHEDULED ?PLEASE FOLLOW UP WITH ENDOCRINOLOGY  ? ? ?IMPORTANT INFORMATION: PAY CLOSE ATTENTION  ? ?PHYSICIAN DISCHARGE INSTRUCTIONS ? ?Follow with Primary care provider  Deon Pilling, NP  and other consultants as instructed by your Hospitalist Physician ? ?SEEK MEDICAL CARE OR RETURN TO EMERGENCY ROOM IF SYMPTOMS COME BACK, WORSEN OR NEW PROBLEM DEVELOPS  ? ?Please note: ?You were cared for by a hospitalist during your hospital stay. Every effort will be made to forward records to your primary care provider.  You can request that your primary care provider send for your hospital records if they have not received them.  Once you are discharged, your primary care physician will handle any further medical issues. Please note that NO REFILLS for any discharge medications will be authorized once you are discharged, as it is imperative that you return to your primary care physician (or establish a relationship with a primary care physician if you do not have one) for your post hospital discharge needs so that they can reassess your need for medications and monitor your lab values. ? ?Please get a complete blood count and chemistry panel checked by your Primary MD at your next visit, and again as instructed by your Primary MD. ? ?Get Medicines reviewed and adjusted: ?Please take all your medications with you for your next visit with your Primary MD ? ?Laboratory/radiological data: ?Please request your Primary MD to go over all hospital tests and procedure/radiological results at the follow up, please ask your primary care provider to get all Hospital records sent to his/her office. ? ?In some cases, they will be blood work, cultures and biopsy results pending at the time of your discharge. Please request that your primary care provider follow up on these results. ? ?If you are diabetic, please bring your blood sugar readings with you to  your follow up appointment with primary care.   ? ?Please call and make your follow up appointments as soon as possible.   ? ?Also Note the following: ?If you experience worsening of your admission symptoms, develop shortness of breath, life threatening emergency, suicidal or homicidal thoughts you must seek medical attention immediately by calling 911 or calling your MD immediately  if symptoms less severe. ? ?You must read complete instructions/literature along with all the possible adverse reactions/side effects for all the Medicines you take and that have been prescribed to you. Take any new Medicines after you have completely understood and accpet all the possible adverse reactions/side effects.  ? ?Do not drive when taking Pain medications or sleeping medications (Benzodiazepines) ? ?Do not take more than prescribed Pain, Sleep and Anxiety Medications. It is not advisable to combine anxiety,sleep and pain medications without talking with your primary care practitioner ? ?Special Instructions: If you have smoked or chewed Tobacco  in the last 2 yrs please stop smoking, stop any regular Alcohol  and or any Recreational drug use. ? ?Wear Seat belts while driving.  Do not drive if taking any narcotic, mind altering or controlled substances or recreational drugs or alcohol.  ? ? ? ? ? ?

## 2022-01-12 NOTE — TOC Progression Note (Signed)
Transition of Care (TOC) - Progression Note  ? ? ?Patient Details  ?Name: SEVE MONETTE ?MRN: [id number] ?Date of Birth: [birth year] ? ?Transition of Care (TOC) CM/SW Contact  ?Shade Flood, LCSW ?Phone Number: ?01/12/2022, 2:56 PM ? ?Clinical Narrative:    ? ?TOC notified by RN that pt's wife does not feel pt is medically stable for dc. Dr. Wynetta Emery has spoken with pt's wife and son and dc order remains in place. Pt's wife informs this LCSW of her plan to appeal the dc. Discussed options for SNF referral, resource information for private duty caregivers, and updated her on frequency of [place] visits. ? ?At this time, pt's wife requesting SNF referral for short term rehab. CMS provider options reviewed. Will refer as requested. ? ?TOC will follow. ? ?  ?Barriers to Discharge: Barriers Resolved ? ?Expected Discharge Plan and Services ?  ?In-house Referral: Clinical Social Work ?  ?Post Acute Care Choice: [facility] ?  ?Expected Discharge Date: 01/12/22               ?  ?  ?  ?  ?  ?HH Arranged: PT ?[place] Agency: [facility] ?Date HH Agency Contacted: 01/12/22 ?  ?Representative spoke with at [facility]: Marjory Lies ? ? ?Social Determinants of Health (SDOH) Interventions ?  ? ?Readmission Risk Interventions ?   ? View : No data to display.  ?  ?  ?  ? ? ?

## 2022-01-12 NOTE — Discharge Summary (Signed)
Physician Discharge Summary  ?[name] QIW:[id number] DOB: 12-Nov-1945 DOA: 01/09/2022 ? ?PCP: Deon Pilling, NP ? ?Admit date: 01/09/2022 ?Discharge date: 01/12/2022 ? ?Admitted From:  Home  ?Disposition: Home with HH  ? ?Recommendations for Outpatient Follow-up:  ?Follow up with PCP in 1 weeks ?Follow up / Establish care with Dr. Dorris Fetch (endocrinology) ambulatory referral request made  ?Follow up with neurosurgery on 01/28/22 as scheduled ? ?Home Health:  PT  ? ?Discharge Condition: Stable   ?CODE STATUS: full  ?Diet: soft foods diet recommended  ? ?Brief Hospitalization Summary: ?Please see all hospital notes, images, labs for full details of the hospitalization. ?76 year old male with a history of hypertension, hyperlipidemia, coronary artery disease, CKD stage III, depression, pituitary macroadenoma presenting with 2 to 3-day history of abdominal pain with associated nausea and vomiting.  The patient has a history of chronic abdominal pain, but it had significantly worsened, primarily in the left-sided abdomen over the past 2 to 3 days.  There is no hematemesis.  The patient did have loose stools without any hematochezia or melena.  There is been some subjective fevers and chills.  The patient's spouse at the bedside supplements the history.  There is been no chest pain, shortness breath, coughing, hemoptysis, dysuria, hematuria.  The patient has been suffering from chronic headaches for the past 2 months. ?Regarding his headaches, the patient was admitted to [facility] from 12/27/2021 to 12/28/2021.  At that time, the patient was seen by neurology for his headaches.  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  Neurosurgery was consulted.  They recommended nonoperative management and outpatient follow-up as well as endocrine follow-up.  Ophthalmology saw the patient and noted the patient had bitemporal visual field deficits with mild disc edema.  They recommended outpatient visual field testing.  The patient did  follow-up with outpatient neurology, Dr. Jaynee Eagles on 01/03/2022.  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  The patient was prescribed propranolol and Percocet.  A referral was made to [facility] endocrinology.  According to the patient's spouse, the endocrine appointment is on 03/29/2022. ? ?In the ED, the patient was afebrile hemodynamically stable with oxygen saturation 100% room air.  Sodium 140, potassium 4.4, bicarbonate 24, serum creatinine 2.71.  LFTs were unremarkable.  Lipase 47.  WBC 16.9, hemoglobin 13.2, platelets 269,000.  CT of the brain showed chronic small vessel disease and previously seen pituitary adenoma up to 1.3 cm.  CT of the abdomen and pelvis showed acute diverticulitis of the proximal sigmoid with edema and fat stranding without abscess.  The patient was started on ceftriaxone, metronidazole, IV fluids.  He was admitted for further evaluation and treatment of his diverticulitis and renal failure. ? ?HOSPITAL COURSE BY PROBLEM LIST ? ?Assessment and Plan: ?* Acute diverticulitis ?He was treated with IV zosyn as the metronidazole caused nausea--this has helped  ?He is now tolerating soft diet ?Treated with IV fluids and IV antibiotics  ?judicious IV opioids ?WBC trending down to normal and abdominal pain improved and tolerating soft diet ?Augmentin BID x 7 days to complete full course of antibiotics ? ? ?Acute metabolic encephalopathy ?Multifactorial including opioids, infectious process, renal failure ?--overall improving, near baseline on 5/9 ?--minimize hypnotic/opioid meds ?--discharge home today with home health  ? ?Malnutrition of moderate degree ?Continue supplements ? ?Acute renal failure superimposed on stage 3b chronic kidney disease ([place]) ?Secondary to volume depletion ?Baseline creatinine 1.5-1.7 ?Patient presented with serum creatinine of 2.71 ?  Treated with IV fluids>>improved to baseline ? ?Chronic daily  headache ?Attributed to the patient's pituitary macroadenoma and vascular headache ?Judicious opioids--family notes he has been confused with the opioids at home ?Neurosurgery follow up already arranged ? ?Pituitary macroadenoma ([place]) ?Contributing to the patient's chronic daily headache ?Initially seen on MRI brain 12/28/2021--on impingement on optic chiasm ?Patient has outpatient neurology, neurosurgery, and endocrine appointments ?--pt has neurosurgery appointment 01/28/21 ?--I've made ambulatory referral to [facility] per family request ? ?Anxiety and depression ?Continue Lexapro when able to tolerate p.o. ? ?Hyperlipidemia ?Continue statin. ? ?Essential hypertension ?Temporarily held losartan due to AKI but resumed now as renal function back to baseline ?Patient has been on propanolol for his chronic headache prophylaxis ?Follow up outpatient for ongoing surveillance/management ? ? ?Discharge Diagnoses:  ?Principal Problem: ?  Acute diverticulitis ?Active Problems: ?  Essential hypertension ?  Hyperlipidemia ?  Anxiety and depression ?  Pituitary macroadenoma ([place]) ?  Chronic daily headache ?  Acute renal failure superimposed on stage 3b chronic kidney disease ([place]) ?  Malnutrition of moderate degree ?  Acute metabolic encephalopathy ? ? ?Discharge Instructions: ?Discharge Instructions   ? ? Ambulatory referral to Endocrinology   Complete by: As directed ?  ? ?  ? ?Allergies as of 01/12/2022   ?No Known Allergies ?  ? ?  ?Medication List  ?  ? ?STOP taking these medications   ? ?meloxicam 7.5 MG tablet ?Commonly known as: MOBIC ?  ?predniSONE 5 MG tablet ?Commonly known as: DELTASONE ?  ? ?  ? ?TAKE these medications   ? ?acetaminophen 500 MG tablet ?Commonly known as: TYLENOL ?Take 1 tablet (500 mg total) by mouth every 6 (six) hours as needed for mild pain or headache. ?  ?allopurinol 300 MG tablet ?Commonly known as: ZYLOPRIM ?Take 300 mg by mouth daily. ?  ?amoxicillin-clavulanate 875-125 MG  tablet ?Commonly known as: Augmentin ?Take 1 tablet by mouth 2 (two) times daily for 7 days. ?  ?ENBREL [place] ?Inject 1 Dose into the skin every Monday. ?  ?escitalopram 20 MG tablet ?Commonly known as: LEXAPRO ?Take 20 mg by mouth at bedtime. ?  ?Fish Oil 1200 MG Caps ?Take 6,000 mg by mouth daily. Takes 5 capsules every morning ?  ?folic acid 1 MG tablet ?Commonly known as: FOLVITE ?Take 1 mg by mouth daily. ?  ?losartan 25 MG tablet ?Commonly known as: COZAAR ?Take 25 mg by mouth daily. ?  ?ondansetron 8 MG disintegrating tablet ?Commonly known as: ZOFRAN-ODT ?Take 1 tablet (8 mg total) by mouth every 8 (eight) hours as needed for nausea or vomiting. ?  ?oxyCODONE-acetaminophen 10-325 MG tablet ?Commonly known as: PERCOCET ?Take 1 tablet by mouth every 6 (six) hours as needed for pain. ?  ?pantoprazole 40 MG tablet ?Commonly known as: PROTONIX ?Take 40 mg by mouth daily. ?  ?pravastatin 40 MG tablet ?Commonly known as: PRAVACHOL ?Take 40 mg by mouth daily. ?  ?propranolol ER 60 MG 24 hr capsule ?Commonly known as: INDERAL LA ?Take 1 capsule (60 mg total) by mouth daily. ?  ?testosterone cypionate 200 MG/ML injection ?Commonly known as: DEPOTESTOSTERONE CYPIONATE ?Inject 1 mL into the muscle every 28 (twenty-eight) days. ?  ?traZODone 50 MG tablet ?Commonly known as: DESYREL ?Take 50 mg by mouth at bedtime as needed for sleep. ?  ? ?  ? ? Follow-up Information   ? ? Cassandria Anger, MD. Schedule an appointment as soon as possible for a visit in 2 week(s).   ?  Specialty: Endocrinology ?Contact information: ?[address] ?[place] [address] ?[phone] ? ? ?  ?  ? ? Deon Pilling, NP. Schedule an appointment as soon as possible for a visit in 1 week(s).   ?Why: Hospital Follow Up ?Contact information: ?[address] ?[address] ?[place] [address] ?[phone] ? ? ?  ?  ? ?  ?  ? ?  ? ?No Known Allergies ?Allergies as of 01/12/2022   ?No Known Allergies ?  ? ?  ?Medication List  ?  ? ?STOP taking  these medications   ? ?meloxicam 7.5 MG tablet ?Commonly known as: MOBIC ?  ?predniSONE 5 MG tablet ?Commonly known as: DELTASONE ?  ? ?  ? ?TAKE these medications   ? ?acetaminophen 500 MG tablet ?Commonly known as: TYLENOL

## 2022-01-12 NOTE — Progress Notes (Signed)
Transition of Care (TOC) -30 day Note   ?  ?  ?Patient Details  ?Name: [name] ?Date of Birth: [birth year] ?  ?Transition of Care (TOC) CM/SW Contact  ?Name: Shade Flood ?Phone Number: [phone] ?Date: 01/12/2022 ?  ?  ?To Whom it May Concern: ?  ?Please be advised that the above patient will require a short-term nursing home stay, anticipated 30 days or less rehabilitation and strengthening. The plan is for return home.  ?  ? ?

## 2022-01-12 NOTE — Progress Notes (Signed)
Pt's family was bedside and had concerns about his discharge. Message sent to MD. MD addressed concerns with family. Family wanted to speak with Transitions of Care to see what options they had because family didn't feel it was safe for pt to go home. Transitions of Care addressed concerns with family.  ?

## 2022-01-12 NOTE — Progress Notes (Signed)
Pt more awake, did ask to get up into chair. Pt assisted up to recliner, very unsteady on feet today, required assist x x2. Pt oriented to self only, very drowsy, only keeps eyes open for short period of time then falls back to sleep. Pt given crackers and soda, able to feed self but fell asleep with cracker held in hand to mouth. Awakens to name called, answers yes/no questions appropriately. Pt denies any pain at this time. Finished eating cracker with encouragement and able to hold cup and take sip of cola, but movements very slow and was falling asleep again before he could sit cup on bedside table.  ? ?Wife remains at bedside. She is pleasant and cooperative, concerned that pt is not stable for discharge home due to increased sleepiness, unsteadiness on feet, confusion, and pt has not taken any food or fluids today until now. Wife has already talked with TOC regarding discharge. Administrative Supervisor Tawnya Crook, RN in room speaking with wife regarding her concerns and request for MD change. ?

## 2022-01-13 ENCOUNTER — Telehealth: Payer: Self-pay | Admitting: Neurology

## 2022-01-13 DIAGNOSIS — F419 Anxiety disorder, unspecified: Secondary | ICD-10-CM

## 2022-01-13 DIAGNOSIS — F32A Depression, unspecified: Secondary | ICD-10-CM

## 2022-01-13 LAB — BASIC METABOLIC PANEL
Anion gap: 8 (ref 5–15)
BUN: 34 mg/dL — ABNORMAL HIGH (ref 8–23)
CO2: 27 mmol/L (ref 22–32)
Calcium: 8.2 mg/dL — ABNORMAL LOW (ref 8.9–10.3)
Chloride: 101 mmol/L (ref 98–111)
Creatinine, Ser: 2.28 mg/dL — ABNORMAL HIGH (ref 0.61–1.24)
GFR, Estimated: 29 mL/min — ABNORMAL LOW (ref >60)
Glucose, Bld: 124 mg/dL — ABNORMAL HIGH (ref 70–99)
Potassium: 3.2 mmol/L — ABNORMAL LOW (ref 3.5–5.1)
Sodium: 136 mmol/L (ref 135–145)

## 2022-01-13 LAB — MAGNESIUM: Magnesium: 1.8 mg/dL (ref 1.7–2.4)

## 2022-01-13 MED ORDER — PREDNISONE 10 MG PO TABS
5.0000 mg | ORAL_TABLET | Freq: Every day | ORAL | Status: DC
  Administered 2022-01-14: 5 mg via ORAL
  Filled 2022-01-13: qty 1

## 2022-01-13 MED ORDER — AMOXICILLIN-POT CLAVULANATE 500-125 MG PO TABS
1.0000 | ORAL_TABLET | Freq: Two times a day (BID) | ORAL | Status: DC
  Administered 2022-01-13 – 2022-01-14 (×2): 500 mg via ORAL
  Filled 2022-01-13 (×2): qty 1

## 2022-01-13 MED ORDER — POTASSIUM CHLORIDE 2 MEQ/ML IV SOLN
INTRAVENOUS | Status: DC
  Filled 2022-01-13 (×7): qty 1000

## 2022-01-13 NOTE — Telephone Encounter (Signed)
Mychart message has been sent back to pt's son.  ?

## 2022-01-13 NOTE — Telephone Encounter (Signed)
Dr. Roderic Palau, Patient is seeing Dr. Arnoldo Morale in Neurosurgery soon. Dr. Arnoldo Morale would like a repeat MRI brain w/wo contrast with pituitary protocol as well as MRI Orbits w/wo contrast prior to seeing him soon. Is there any way this can be ordered inpatient? I think he needs surgical intervention on that pituitary tumor soon ? ?Thanks, Dr. Jaynee Eagles ?

## 2022-01-13 NOTE — Telephone Encounter (Signed)
Patient's wife just emailed me and said that patient is acutely declining.  We have tried very hard to get him an endocrinology appointment however I do not think that this has anything to do with an endocrinologic issue, I did a full panel and did not find any reversible causes.  I have also discussed with Dr. Arnoldo Morale who said he would get him in to the office sooner.  I am just not sure what else to do.  I believe he does have a macroadenoma that is causing him significant headaches and that he possibly needs surgical intervention especially considering ophthalmology did note optic nerve edema and bitemporal vision loss.  Please call wife and have her go to [facility] and I can contact the emergency room and the neuro-ophthalmologist on-call. ?

## 2022-01-13 NOTE — Progress Notes (Signed)
?PROGRESS NOTE ? ? ? ?[name]  WJX:[id number] DOB: 10/11/1945 DOA: 01/09/2022 ?PCP: Deon Pilling, NP  ? ? ?Brief Narrative:  ?76 year old male with a history of hypertension, hyperlipidemia, coronary artery disease, CKD stage III, depression, pituitary macroadenoma presenting with 2 to 3-day history of abdominal pain with associated nausea and vomiting.  The patient has a history of chronic abdominal pain, but it had significantly worsened, primarily in the left-sided abdomen over the past 2 to 3 days.  There is no hematemesis.  The patient did have loose stools without any hematochezia or melena.  There is been some subjective fevers and chills.  The patient's spouse at the bedside supplements the history.  There is been no chest pain, shortness breath, coughing, hemoptysis, dysuria, hematuria.  The patient has been suffering from chronic headaches for the past 2 months. ?Regarding his headaches, the patient was admitted to [facility] from 12/27/2021 to 12/28/2021.  At that time, the patient was seen by neurology for his headaches.  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  Neurosurgery was consulted.  They recommended nonoperative management and outpatient follow-up as well as endocrine follow-up.  Ophthalmology saw the patient and noted the patient had bitemporal visual field deficits with mild disc edema.  They recommended outpatient visual field testing.  The patient did follow-up with outpatient neurology, Dr. Jaynee Eagles on 01/03/2022.  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  The patient was prescribed propranolol and Percocet.  A referral was made to [facility] endocrinology.  According to the patient's spouse, the endocrine appointment is on 03/29/2022. ?  ?In the ED, the patient was afebrile hemodynamically stable with oxygen saturation 100% room air.  Sodium 140, potassium 4.4, bicarbonate 24, serum creatinine 2.71.  LFTs were  unremarkable.  Lipase 47.  WBC 16.9, hemoglobin 13.2, platelets 269,000.  CT of the brain showed chronic small vessel disease and previously seen pituitary adenoma up to 1.3 cm.  CT of the abdomen and pelvis showed acute diverticulitis of the proximal sigmoid with edema and fat stranding without abscess.  The patient was started on ceftriaxone, metronidazole, IV fluids.  He was admitted for further evaluation and treatment of his diverticulitis and renal failure. ? ? ?Assessment & Plan: ?  ?Principal Problem: ?  Acute diverticulitis ?Active Problems: ?  Essential hypertension ?  Hyperlipidemia ?  Rheumatoid arthritis ([place]) ?  Anxiety and depression ?  Pituitary macroadenoma ([place]) ?  Chronic daily headache ?  Acute renal failure superimposed on stage 3b chronic kidney disease ([place]) ?  Malnutrition of moderate degree ?  Acute metabolic encephalopathy ? ? ?Acute diverticulitis ?-Initially treated with intravenous antibiotics ?-WBC count has normalized ?-Abdominal pain appears to have improved ?-He has been transitioned to Augmentin to complete his course ? ?Acute metabolic encephalopathy ?-His wife reports that she has noted some intermittent confusion over the past 3 months ?-Currently, appears to be more somnolent than he is at baseline ?-Suspect that he may have some degree of dehydration which could be contributing to this ?-We will start on IV fluids and monitor mental status ?-Other work-up including ammonia, B12 unremarkable ? ?Hypokalemia ?-Likely related to decreased p.o. intake ?-Replace ? ?AKI on CKD stage IIIb ?-Creatinine initially 2.7 on admission ?-Baseline creatinine around 1.5 ?-Creatinine did improve with IV hydration to 1.5, but has since trended back up to 2.2 ?-We will hold further ARB for now ?-Recent imaging did not show any evidence of hydronephrosis ?-We  will continue on IV fluids and monitor urine output/renal function ? ?Pituitary macroadenoma ?-Discussed in detail with patient's primary  neurologist Dr. Jaynee Eagles who also discussed this case with neurosurgery, Dr. Arnoldo Morale ?-Request has been made to help expedite his care for macroadenoma since this does appear to be significantly symptomatic ?-MRI brain w/wo contrast with pituitary protocol as well as MRI orbits w/wo contrast has been requested as part of his preop evaluation ?-We will hydrate the patient overnight and recheck labs in a.m. ?-If renal function has improved, will be reasonable to give the patient IV contrast, otherwise would have to do without contrast ?-Referral has been made to endocrinology ? 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 ?Rheumatoid arthritis ?-He is on chronic prednisone therapy as well as weekly Enbrel ? ?Hypertension ?-Holding losartan in light of bump in creatinine ?-Continue on propranolol ? ?Hyperlipidemia ?-Continue statin ? ?Anxiety depression ?-On Lexapro ? ?Generalized weakness ?-Seen by physical therapy with recommendations for skilled nursing facility placement ?-TOC following for placement ? ? ?DVT prophylaxis: heparin injection 5,000 Units Start: 01/09/22 1600 ? ?Code Status: Full code ?Family Communication: Discussed with wife and son at the bedside ?Disposition Plan: Status is: Inpatient ?Remains inpatient appropriate because: Awaiting placement in skilled nursing facility ? ? ? ? ?Consultants:  ? ? ?Procedures:  ? ? ?Antimicrobials:  ?  ? ? ?Subjective: ?Patient's wife reports that he has been persistently somnolent.  He does wake up, but often falls back asleep during the day.  His p.o. intake has been very poor.  He did not eat anything yesterday.  He did have a banana this morning.  Does not really complain of abdominal pain.  Has increasing generalized weakness.  No vomiting. ? ?Objective: ?Vitals:  ? 01/12/22 0620 01/12/22 1328 01/12/22 2127 01/13/22 0526  ?BP: (!) 187/81 (!) 156/75 132/70 (!)  142/75  ?Pulse: 70 81 73 82  ?Resp: '17  18 18  '$ ?Temp: (!) 97 ?F (36.1 ?C) 99.3 ?F (37.4 ?C) 98 ?F (36.7 ?C) 98.6 ?F (37 ?C)  ?TempSrc:  Oral    ?SpO2: 98% 99% 95% 96%  ?Weight:      ?Height:      ? ? ?Intake/Output Summary (Last 24 hours) at 01/13/2022 1859 ?Last data filed at 01/13/2022 1829 ?Gross per 24 hour  ?Intake 1428.54 ml  ?Output 500 ml  ?Net 928.54 ml  ? ?Filed Weights  ? 01/09/22 0837  ?Weight: 78.5 kg  ? ? ?Examination: ? ?General exam: Somnolent, but wakes up to voice, answers questions appropriately ?Respiratory system: Clear to auscultation. Respiratory effort normal. ?Cardiovascular system: S1 & S2 heard, RRR. No JVD, murmurs, rubs, gallops or clicks. No pedal edema. ?Gastrointestinal system: Abdomen is nondistended, soft and nontender. No organomegaly or masses felt. Normal bowel sounds heard. ?Central nervous system: Alert and oriented. No focal neurological deficits. ?Extremities: Symmetric 5 x 5 power. ?Skin: No rashes, lesions or ulcers ?Psychiatry: Judgement and insight appear normal. Mood & affect appropriate.  ? ? ? ?Data Reviewed: I have personally reviewed following labs and imaging studies ? ?CBC: ?Recent Labs  ?Lab 01/09/22 ?1019 01/10/22 ?3662 01/11/22 ?9476 01/12/22 ?0510  ?WBC 16.9* 11.6* 10.8* 10.0  ?HGB 13.2 12.4* 13.0 13.0  ?HCT 39.6 38.7* 39.8 38.8*  ?MCV 92.7 92.1 90.7 89.4  ?PLT 269 256 281 276  ? ?Basic Metabolic Panel: ?Recent Labs  ?Lab 01/09/22 ?1019 01/10/22 ?5465 01/11/22 ?0354 01/12/22 ?0510 01/13/22 ?1021  ?NA  140 143 144 140 136  ?K 4.4 4.0 3.9 3.4* 3.2*  ?CL 107 108 106 103 101  ?CO2 '24 28 27 27 27  '$ ?GLUCOSE 116* 104* 96 94 124*  ?BUN 64* 45* 32* 27* 34*  ?CREATININE 2.71* 1.83* 1.59* 1.60* 2.28*  ?CALCIUM 8.6* 8.6* 8.6* 8.3* 8.2*  ?MG  --  1.7 1.9 1.7 1.8  ? ?GFR: ?Estimated Creatinine Clearance: 26.3 mL/min (A) (by C-G formula based on SCr of 2.28 mg/dL (H)). ?Liver Function Tests: ?Recent Labs  ?Lab 01/09/22 ?1019  ?AST 38  ?ALT 20  ?ALKPHOS 70  ?BILITOT 1.1  ?PROT  6.8  ?ALBUMIN 3.0*  ? ?Recent Labs  ?Lab 01/09/22 ?1019  ?LIPASE 47  ? ?Recent Labs  ?Lab 01/11/22 ?2446  ?AMMONIA 14  ? ?Coagulation Profile: ?Recent Labs  ?Lab 01/09/22 ?1311  ?INR 1.2  ? ?Cardiac Enzymes: ?No resul

## 2022-01-13 NOTE — Progress Notes (Signed)
Physical Therapy Treatment ?Patient Details ?Name: [name] ?MRN: [id number] ?DOB: [birth year] ?Today's Date: 01/13/2022 ? ? ?History of Present Illness [name] is a 76 year old male with a history of hypertension, hyperlipidemia, coronary artery disease, CKD stage III, depression, pituitary macroadenoma presenting with 2 to 3-day history of abdominal pain with associated nausea and vomiting.  The patient has a history of chronic abdominal pain, but it had significantly worsened, primarily in the left-sided abdomen over the past 2 to 3 days.  There is no hematemesis.  The patient did have loose stools without any hematochezia or melena.  There is been some subjective fevers and chills.  The patient's spouse at the bedside supplements the history.  There is been no chest pain, shortness breath, coughing, hemoptysis, dysuria, hematuria.  The patient has been suffering from chronic headaches for the past 2 months.  Regarding his headaches, the patient was admitted to [facility] from 12/27/2021 to 12/28/2021.  At that time, the patient was seen by neurology for his headaches.  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  Neurosurgery was consulted.  They recommended nonoperative management and outpatient follow-up as well as endocrine follow-up.  Ophthalmology saw the patient and noted the patient had bitemporal visual field deficits with mild disc edema.  They recommended outpatient visual field testing.  The patient did follow-up with outpatient neurology, Dr. Jaynee Eagles on 01/03/2022.  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  The patient was prescribed propranolol and Percocet.  A referral was made to [facility] endocrinology.  According to the patient's spouse, the endocrine appointment is on 03/29/2022. ? ?  ?PT Comments  ? ? Patient demonstrates slow labored movement for sitting up at bedside with c/o low back pain, fair/good return for  completing BLE ROM/strengthening exercises with verbal cues, slightly increased endurance/distance for gait training without loss of balance and limited mostly due to fatigue.  Patient tolerated sitting up in chair after therapy with his spouse present.  Patient will benefit from continued skilled physical therapy in hospital and recommended venue below to increase strength, balance, endurance for safe ADLs and gait.  ? ? ?   ?Recommendations for follow up therapy are one component of a multi-disciplinary discharge planning process, led by the attending physician.  Recommendations may be updated based on patient status, additional functional criteria and insurance authorization. ? ?Follow Up Recommendations ? Skilled nursing-short term rehab (<3 hours/day) ?  ?  ?Assistance Recommended at Discharge Intermittent Supervision/Assistance  ?Patient can return home with the following A little help with walking and/or transfers;A little help with bathing/dressing/bathroom;Help with stairs or ramp for entrance;Assistance with cooking/housework ?  ?Equipment Recommendations ? None recommended by PT  ?  ?Recommendations for Other Services   ? ? ?  ?Precautions / Restrictions Precautions ?Precautions: Fall ?Restrictions ?Weight Bearing Restrictions: No  ?  ? ?Mobility ? Bed Mobility ?Overal bed mobility: Needs Assistance ?Bed Mobility: Supine to Sit ?  ?  ?Supine to sit: Min assist ?  ?  ?General bed mobility comments: increased time, labored movement ?  ? ?Transfers ?Overall transfer level: Needs assistance ?Equipment used: Rolling walker (2 wheels) ?Transfers: Sit to/from Stand, Bed to chair/wheelchair/BSC ?Sit to Stand: Min guard, Min assist ?  ?Step pivot transfers: Min guard, Min assist ?  ?  ?  ?General transfer comment: slightly labored movement, increased time ?  ? ?Ambulation/Gait ?Ambulation/Gait assistance: Min assist, Min guard ?Gait Distance (Feet): 60 Feet ?Assistive  device: Rolling walker (2 wheels) ?Gait  Pattern/deviations: Decreased step length - right, Decreased step length - left, Decreased stride length, Trunk flexed ?Gait velocity: decreased ?  ?  ?General Gait Details: slow slightly labored cadence having to lean over RW to support self, no loss of balance and limited mostly due to c/o fatigue ? ? ?Stairs ?  ?  ?  ?  ?  ? ? ?Wheelchair Mobility ?  ? ?Modified Rankin (Stroke Patients Only) ?  ? ? ?  ?Balance Overall balance assessment: Needs assistance ?Sitting-balance support: Feet supported, No upper extremity supported ?Sitting balance-Leahy Scale: Fair ?Sitting balance - Comments: fair/good seated EOB ?  ?Standing balance support: During functional activity, Bilateral upper extremity supported ?Standing balance-Leahy Scale: Fair ?Standing balance comment: using RW ?  ?  ?  ?  ?  ?  ?  ?  ?  ?  ?  ?  ? ?  ?Cognition Arousal/Alertness: Awake/alert ?Behavior During Therapy: [facility] for tasks assessed/performed ?Overall Cognitive Status: Within Functional Limits for tasks assessed ?  ?  ?  ?  ?  ?  ?  ?  ?  ?  ?  ?  ?  ?  ?  ?  ?  ?  ?  ? ?  ?Exercises General Exercises - Lower Extremity ?Long Arc Quad: Seated, AROM, Strengthening, Both, 10 reps ?Hip Flexion/Marching: Seated, AROM, Strengthening, Both, 10 reps ?Toe Raises: Seated, AROM, Strengthening, Both, 10 reps ?Heel Raises: Seated, AROM, Strengthening, Both, 10 reps ? ?  ?General Comments   ?  ?  ? ?Pertinent Vitals/Pain Pain Assessment ?Pain Assessment: Faces ?Faces Pain Scale: Hurts little more ?Pain Location: low back ?Pain Descriptors / Indicators: Sore, Guarding, Grimacing ?Pain Intervention(s): Limited activity within patient's tolerance, Monitored during session, Repositioned  ? ? ?Home Living   ?  ?  ?  ?  ?  ?  ?  ?  ?  ?   ?  ?Prior Function    ?  ?  ?   ? ?PT Goals (current goals can now be found in the care plan section) Acute Rehab PT Goals ?Patient Stated Goal: return home with family to assist ?PT Goal Formulation: With patient/family ?Time For  Goal Achievement: 01/25/22 ?Potential to Achieve Goals: Good ?Progress towards PT goals: Progressing toward goals ? ?  ?Frequency ? ? ? Min 3X/week ? ? ? ?  ?PT Plan Current plan remains appropriate  ? ? ?Co-evaluation   ?  ?  ?  ?  ? ?  ?AM-PAC PT "6 Clicks" Mobility   ?Outcome Measure ? Help needed turning from your back to your side while in a flat bed without using bedrails?: None ?Help needed moving from lying on your back to sitting on the side of a flat bed without using bedrails?: A Little ?Help needed moving to and from a bed to a chair (including a wheelchair)?: A Little ?Help needed standing up from a chair using your arms (e.g., wheelchair or bedside chair)?: A Little ?Help needed to walk in hospital room?: A Little ?Help needed climbing 3-5 steps with a railing? : A Little ?6 Click Score: 19 ? ?  ?End of Session   ?Activity Tolerance: Patient tolerated treatment well;Patient limited by fatigue ?Patient left: in chair;with call bell/phone within reach;with family/visitor present ?Nurse Communication: Mobility status ?PT Visit Diagnosis: Unsteadiness on feet (R26.81);Other abnormalities of gait and mobility (R26.89);Muscle weakness (generalized) (M62.81) ?  ? ? ?Time: 6759-1638 ?PT Time Calculation (min) (ACUTE  ONLY): 20 min ? ?Charges:  $Gait Training: 8-22 mins ?$Therapeutic Exercise: 8-22 mins          ?          ? ?3:06 PM, 01/13/22 ?Lonell Grandchild, MPT ?Physical Therapist with [place] ?[facility] ?[phone] office ?[phone] mobile phone ? ? ?

## 2022-01-13 NOTE — TOC Progression Note (Signed)
Transition of Care (TOC) - Progression Note  ? ? ?Patient Details  ?Name: MYRLE DUES ?MRN: [id number] ?Date of Birth: 1946-05-02 ? ?Transition of Care (TOC) CM/SW Contact  ?Boneta Lucks, RN ?Phone Number: ?01/13/2022, 4:39 PM ? ?Clinical Narrative:   Patient weak, poor food intake, creatine evaluated, discharge cancelled. Insurance Auth started for [facility].  ?  ?Barriers to Discharge: Barriers Resolved ? ?Expected Discharge Plan and Services ?  ?In-house Referral: Clinical Social Work ?  ?Post Acute Care Choice: [place] ?  ?Expected Discharge Date: 01/12/22               ?   ?HH Arranged: PT ?[place] Agency: [place] ?Date HH Agency Contacted: 01/12/22 ?  ?Representative spoke with at [place]: Marjory Lies ? ? ?Readmission Risk Interventions ? ?  01/13/2022  ?  4:39 PM  ?Readmission Risk Prevention Plan  ?Transportation Screening Complete  ?PCP or Specialist Appt within 5-7 Days Not Complete  ?Home Care Screening Complete  ?Medication Review (RN CM) Complete  ? ? ?

## 2022-01-14 ENCOUNTER — Inpatient Hospital Stay (HOSPITAL_COMMUNITY): Payer: Medicare Other

## 2022-01-14 LAB — BASIC METABOLIC PANEL
Anion gap: 6 (ref 5–15)
BUN: 37 mg/dL — ABNORMAL HIGH (ref 8–23)
CO2: 26 mmol/L (ref 22–32)
Calcium: 7.9 mg/dL — ABNORMAL LOW (ref 8.9–10.3)
Chloride: 106 mmol/L (ref 98–111)
Creatinine, Ser: 2.03 mg/dL — ABNORMAL HIGH (ref 0.61–1.24)
GFR, Estimated: 33 mL/min — ABNORMAL LOW (ref >60)
Glucose, Bld: 98 mg/dL (ref 70–99)
Potassium: 3.9 mmol/L (ref 3.5–5.1)
Sodium: 138 mmol/L (ref 135–145)

## 2022-01-14 LAB — CULTURE, BLOOD (ROUTINE X 2)
Culture: NO GROWTH
Culture: NO GROWTH
Special Requests: ADEQUATE
Special Requests: ADEQUATE

## 2022-01-14 LAB — MAGNESIUM: Magnesium: 1.8 mg/dL (ref 1.7–2.4)

## 2022-01-14 MED ORDER — LOPERAMIDE HCL 2 MG PO CAPS
2.0000 mg | ORAL_CAPSULE | Freq: Once | ORAL | Status: AC
  Administered 2022-01-14: 2 mg via ORAL
  Filled 2022-01-14: qty 1

## 2022-01-14 MED ORDER — GADOBUTROL 1 MMOL/ML IV SOLN
7.5000 mL | Freq: Once | INTRAVENOUS | Status: AC | PRN
  Administered 2022-01-14: 7.5 mL via INTRAVENOUS

## 2022-01-14 MED ORDER — LOPERAMIDE HCL 2 MG PO CAPS: 2.0000 mg | ORAL_CAPSULE | Freq: Four times a day (QID) | ORAL | 0 refills | Status: DC | PRN

## 2022-01-14 NOTE — TOC Transition Note (Signed)
Transition of Care (TOC) - CM/SW Discharge Note ? ? ?Patient Details  ?Name: [name] ?MRN: [id number] ?Date of Birth: 1946-02-18 ? ?Transition of Care (TOC) CM/SW Contact:  ?Boneta Lucks, RN ?Phone Number: ?01/14/2022, 2:11 PM ? ? ?Clinical Narrative:   [facility] offered , wife accepted, and Insurance auth started. MD seeing patient and states wife now wants to take him home with home health. He has been referred to [place]. Marjory Lies updated and orders have been placed.  ? ?Final next level of care: [place] ?Barriers to Discharge: Barriers Resolved ? ? ?Patient Goals and CMS Choice ?Patient states their goals for this hospitalization and ongoing recovery are:: to go home. ?CMS Medicare.gov Compare Post Acute Care list provided to:: Patient ?Choice offered to / list presented to : Patient ? ?Discharge Placement ?  ?      ?Name of family member notified: Wife ?Patient and family notified of of transfer: 01/14/22 ? ?Discharge Plan and Services ?In-house Referral: Clinical Social Work ?  ?Post Acute Care Choice: [place]          ?  ?   ?HH Arranged: PT ?[place] Agency: [facility] ?Date HH Agency Contacted: 01/12/22 ?  ?Representative spoke with at [place]: Marjory Lies ? ?Readmission Risk Interventions ? ?  01/13/2022  ?  4:39 PM  ?Readmission Risk Prevention Plan  ?Transportation Screening Complete  ?PCP or Specialist Appt within 5-7 Days Not Complete  ?Home Care Screening Complete  ?Medication Review (RN CM) Complete  ? ? ? ? ? ?

## 2022-01-14 NOTE — Discharge Summary (Signed)
Physician Discharge Summary  ?[name] HEN:[id number] DOB: 1946-07-15 DOA: 01/09/2022 ? ?PCP: Deon Pilling, NP ? ?Admit date: 01/09/2022 ?Discharge date: 01/14/2022 ? ?Admitted From:  Home  ?Disposition: Home with HH  ? ?Recommendations for Outpatient Follow-up:  ?Follow up with PCP in 1 weeks ?Follow up / Establish care with Dr. Dorris Fetch (endocrinology) on 5/15 ?Follow up with neurosurgery next week as scheduled ? ?Home Health:  PT  ? ?Discharge Condition: Stable   ?CODE STATUS: full  ?Diet: soft foods diet recommended  ? ?Brief Hospitalization Summary: ?Please see all hospital notes, images, labs for full details of the hospitalization. ?76 year old male with a history of hypertension, hyperlipidemia, coronary artery disease, CKD stage III, depression, pituitary macroadenoma presenting with 2 to 3-day history of abdominal pain with associated nausea and vomiting.  The patient has a history of chronic abdominal pain, but it had significantly worsened, primarily in the left-sided abdomen over the past 2 to 3 days.  There is no hematemesis.  The patient did have loose stools without any hematochezia or melena.  There is been some subjective fevers and chills.  The patient's spouse at the bedside supplements the history.  There is been no chest pain, shortness breath, coughing, hemoptysis, dysuria, hematuria.  The patient has been suffering from chronic headaches for the past 2 months. ?Regarding his headaches, the patient was admitted to [facility] from 12/27/2021 to 12/28/2021.  At that time, the patient was seen by neurology for his headaches.  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  Neurosurgery was consulted.  They recommended nonoperative management and outpatient follow-up as well as endocrine follow-up.  Ophthalmology saw the patient and noted the patient had bitemporal visual field deficits with mild disc edema.  They recommended outpatient visual field testing.  The patient did follow-up with outpatient  neurology, Dr. Jaynee Eagles on 01/03/2022.  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  The patient was prescribed propranolol and Percocet.  A referral was made to [facility] endocrinology.  According to the patient's spouse, the endocrine appointment is on 03/29/2022. ? ?In the ED, the patient was afebrile hemodynamically stable with oxygen saturation 100% room air.  Sodium 140, potassium 4.4, bicarbonate 24, serum creatinine 2.71.  LFTs were unremarkable.  Lipase 47.  WBC 16.9, hemoglobin 13.2, platelets 269,000.  CT of the brain showed chronic small vessel disease and previously seen pituitary adenoma up to 1.3 cm.  CT of the abdomen and pelvis showed acute diverticulitis of the proximal sigmoid with edema and fat stranding without abscess.  The patient was started on ceftriaxone, metronidazole, IV fluids.  He was admitted for further evaluation and treatment of his diverticulitis and renal failure. ? ?HOSPITAL COURSE BY PROBLEM LIST ? ?Assessment and Plan: ?* Acute diverticulitis ?He was treated with IV zosyn as the metronidazole caused nausea--this has helped  ?He is now tolerating soft diet ?Treated with IV fluids and IV antibiotics  ?judicious IV opioids ?WBC trending down to normal and abdominal pain improved and tolerating soft diet ?Augmentin BID x 7 days to complete full course of antibiotics ? ? ?Acute metabolic encephalopathy ?Multifactorial including opioids, infectious process, renal failure ?--overall improving, near baseline on 5/12 ?--minimize hypnotic/opioid meds ?--discharge home today with home health  ? ?Malnutrition of moderate degree ?Continue supplements ? ?Acute renal failure superimposed on stage 3b chronic kidney disease ([place]) ?Secondary to volume depletion ?Baseline creatinine 1.5-1.7 ?Patient presented with serum creatinine of 2.71 ?Treated with IV  fluids>>improved to 2.0 on discharge ?-Continue to hold losartan on  discharge ?-Advised patient not to take any further meloxicam after discharge ? ?Chronic daily headache ?Attributed to the patient's pituitary macroadenoma and vascular headache ?Judicious opioids--family notes he has been confused with the opioids at home ?Neurosurgery follow up already arranged for next week ?-At the time of discharge, he was not complaining of any headaches ? ?Pituitary macroadenoma ([place]) ?Contributing to the patient's chronic daily headache ?Initially seen on MRI brain 12/28/2021--on impingement on optic chiasm ?Patient has outpatient neurology, neurosurgery, and endocrine appointments ?--pt has neurosurgery appointment next week ?-- Endocrinology appointment obtained for 5/15 ?-He had repeat MRI brain/MRI orbits during this admission ? ?Anxiety and depression ?Continue Lexapro when able to tolerate p.o. ? ?Hyperlipidemia ?Continue statin. ? ?Essential hypertension ?Continue to hold losartan on discharge ?Patient has been on propanolol for his chronic headache prophylaxis ?Follow up outpatient for ongoing surveillance/management ?-Blood pressures currently stable ? ?Rheumatoid arthritis ?-On chronic home dose of prednisone and Enbrel ? ?Low testosterone ?-Has been on chronic testosterone supplement therapy ?-Reports that he has been taking this intermittently has not been regular ?-advised to discuss this further with endocrinology on his appointment ? ? ?Discharge Diagnoses:  ?Principal Problem: ?  Acute diverticulitis ?Active Problems: ?  Essential hypertension ?  Hyperlipidemia ?  Rheumatoid arthritis ([place]) ?  Anxiety and depression ?  Pituitary macroadenoma ([place]) ?  Chronic daily headache ?  Acute renal failure superimposed on stage 3b chronic kidney disease ([place]) ?  Malnutrition of moderate degree ?  Acute metabolic encephalopathy ? ? ?Discharge Instructions: ?Discharge Instructions   ? ? Ambulatory referral to Endocrinology   Complete by: As directed ?  ? Diet - low sodium heart healthy    Complete by: As directed ?  ? Increase activity slowly   Complete by: As directed ?  ? ?  ? ?Allergies as of 01/14/2022   ?No Known Allergies ?  ? ?  ?Medication List  ?  ? ?STOP taking these medications   ? ?losartan 25 MG tablet ?Commonly known as: COZAAR ?  ?meloxicam 7.5 MG tablet ?Commonly known as: MOBIC ?  ? ?  ? ?TAKE these medications   ? ?acetaminophen 500 MG tablet ?Commonly known as: TYLENOL ?Take 1 tablet (500 mg total) by mouth every 6 (six) hours as needed for mild pain or headache. ?  ?allopurinol 300 MG tablet ?Commonly known as: ZYLOPRIM ?Take 300 mg by mouth daily. ?  ?amoxicillin-clavulanate 875-125 MG tablet ?Commonly known as: Augmentin ?Take 1 tablet by mouth 2 (two) times daily for 7 days. ?  ?ENBREL [place] ?Inject 1 Dose into the skin every Monday. ?  ?escitalopram 20 MG tablet ?Commonly known as: LEXAPRO ?Take 20 mg by mouth at bedtime. ?  ?Fish Oil 1200 MG Caps ?Take 6,000 mg by mouth daily. Takes 5 capsules every morning ?  ?folic acid 1 MG tablet ?Commonly known as: FOLVITE ?Take 1 mg by mouth daily. ?  ?loperamide 2 MG capsule ?Commonly known as: IMODIUM ?Take 1 capsule (2 mg total) by mouth every 6 (six) hours as needed for diarrhea or loose stools. ?  ?ondansetron 8 MG disintegrating tablet ?Commonly known as: ZOFRAN-ODT ?Take 1 tablet (8 mg total) by mouth every 8 (eight) hours as needed for nausea or vomiting. ?  ?oxyCODONE-acetaminophen 10-325 MG tablet ?Commonly known as: PERCOCET ?Take 1 tablet by mouth every 6 (six) hours as needed for pain. ?  ?pantoprazole 40 MG tablet ?Commonly known as: PROTONIX ?Take  40 mg by mouth daily. ?  ?pravastatin 40 MG tablet ?Commonly known as: PRAVACHOL ?Take 40 mg by mouth daily. ?  ?predniSONE 5 MG tablet ?Commonly known as: DELTASONE ?Take 5 mg by mouth daily with breakfast. ?  ?propranolol ER 60 MG 24 hr capsule ?Commonly known as: INDERAL LA ?Take 1 capsule (60 mg total) by mouth daily. ?  ?testosterone cypionate 200 MG/ML injection ?Commonly  known as: DEPOTESTOSTERONE CYPIONATE ?Inject 1 mL into the muscle every 28 (twenty-eight) days. ?  ?traZODone 50 MG tablet ?Commonly known as: DESYREL ?Take 50 mg by mouth at bedtime as needed for sleep. ?  ? ?  ? ? [place]

## 2022-01-14 NOTE — Progress Notes (Signed)
Pt discharged home with spouse in stable condition. Discharge instructions given. Scripts sent to pharmacy of choice. No immediate questions or concerns at this time. Discharged from unit via wheelchair.  

## 2022-01-14 NOTE — Plan of Care (Signed)
?  Problem: Education: ?Goal: Knowledge of General Education information will improve ?Description: Including pain rating scale, medication(s)/side effects and non-pharmacologic comfort measures ?Outcome: Adequate for Discharge ?  ?Problem: Health Behavior/Discharge Planning: ?Goal: Ability to manage health-related needs will improve ?Outcome: Adequate for Discharge ?  ?Problem: Clinical Measurements: ?Goal: Ability to maintain clinical measurements within normal limits will improve ?Outcome: Adequate for Discharge ?Goal: Will remain free from infection ?Outcome: Adequate for Discharge ?Goal: Diagnostic test results will improve ?Outcome: Adequate for Discharge ?Goal: Respiratory complications will improve ?Outcome: Adequate for Discharge ?Goal: Cardiovascular complication will be avoided ?Outcome: Adequate for Discharge ?  ?Problem: Activity: ?Goal: Risk for activity intolerance will decrease ?Outcome: Adequate for Discharge ?  ?Problem: Nutrition: ?Goal: Adequate nutrition will be maintained ?Outcome: Adequate for Discharge ?  ?Problem: Coping: ?Goal: Level of anxiety will decrease ?Outcome: Adequate for Discharge ?  ?Problem: Elimination: ?Goal: Will not experience complications related to bowel motility ?Outcome: Adequate for Discharge ?Goal: Will not experience complications related to urinary retention ?Outcome: Adequate for Discharge ?  ?Problem: Pain Managment: ?Goal: General experience of comfort will improve ?Outcome: Adequate for Discharge ?  ?Problem: Safety: ?Goal: Ability to remain free from injury will improve ?Outcome: Adequate for Discharge ?  ?Problem: Skin Integrity: ?Goal: Risk for impaired skin integrity will decrease ?Outcome: Adequate for Discharge ?  ?Problem: Malnutrition  (NI-5.2) ?Goal: Food and/or nutrient delivery ?Description: Individualized approach for food/nutrient provision. ?Outcome: Adequate for Discharge ?  ?Problem: Acute Rehab PT Goals(only PT should resolve) ?Goal: Pt Will Go  Supine/Side To Sit ?Outcome: Adequate for Discharge ?Goal: Patient Will Transfer Sit To/From Stand ?Outcome: Adequate for Discharge ?Goal: Pt Will Transfer Bed To Chair/Chair To Bed ?Outcome: Adequate for Discharge ?Goal: Pt Will Ambulate ?Outcome: Adequate for Discharge ?  ?

## 2022-01-14 NOTE — Telephone Encounter (Signed)
Update: patient's wife states Dr Arnoldo Morale is trying to see if the hospital can get an updated MRI before he discharges. ?

## 2022-01-17 ENCOUNTER — Encounter: Payer: Self-pay | Admitting: "Endocrinology

## 2022-01-17 ENCOUNTER — Ambulatory Visit: Payer: Medicare Other | Admitting: "Endocrinology

## 2022-01-17 VITALS — BP 130/70 | HR 60 | Ht 68.0 in

## 2022-01-17 DIAGNOSIS — D352 Benign neoplasm of pituitary gland: Secondary | ICD-10-CM

## 2022-01-17 NOTE — Progress Notes (Signed)
? ?    Endocrinology Consult Note ?                                           01/17/2022, 5:50 PM ? ? ?Subjective:  ? ? Patient ID: [name], male    DOB: 02-19-1946, PCP Deon Pilling, NP ? ? ?Past Medical History:  ?Diagnosis Date  ? Anxiety and depression   ? Chronic renal insufficiency, stage 3 (moderate) (HCC)   ? DDD (degenerative disc disease), lumbar   ? remote hx of fusion surgery  ? GERD (gastroesophageal reflux disease)   ? Gout   ? History of stomach ulcers 2000  ? Hyperlipidemia   ? Hypertension   ? Rheumatoid arthritis ([place])   ? Rheumatoid   ? ?Past Surgical History:  ?Procedure Laterality Date  ? APPENDECTOMY  1968  ? back fusion  2000  ? CHOLECYSTECTOMY N/A 06/06/2017  ? Procedure: LAPAROSCOPIC CHOLECYSTECTOMY WITH INTRAOPERATIVE CHOLANGIOGRAM;  Surgeon: [name], MD;  Location: [facility];  Service: General;  Laterality: N/A;  ? EYE SURGERY    ? Dr. [name]  lens implants  ? LUMBAR LAMINECTOMY  2000  ? TIBIA FRACTURE SURGERY Left 1998  ? with titanium rods  ? UMBILICAL HERNIA REPAIR N/A 06/06/2017  ? Procedure: UMBILICAL HERNIA REPAIR;  Surgeon: [name], MD;  Location: [facility];  Service: General;  Laterality: N/A;  ? ?Social History  ? ?Socioeconomic History  ? Marital status: Married  ?  Spouse name: Not on file  ? Number of children: 1  ? Years of education: Not on file  ? Highest education level: Not on file  ?Occupational History  ? Not on file  ?Tobacco Use  ? Smoking status: Former  ?  Packs/day: 2.00  ?  Years: 20.00  ?  Pack years: 40.00  ?  Types: Cigarettes  ?  Quit date: 37  ?  Years since quitting: 43.3  ? Smokeless tobacco: Never  ? Tobacco comments:  ?  Stopped early 55s  ?Vaping Use  ? Vaping Use: Never used  ?Substance and Sexual Activity  ? Alcohol use: No  ? Drug use: No  ? Sexual activity: Yes  ?Other Topics Concern  ? Not on file  ?Social History Narrative  ? Lives at home with spouse  ? Right handed  ? Caffeine: 2 cups/day, tea seldom  ? ?Social Determinants of  Health  ? ?Financial Resource Strain: Not on file  ?Food Insecurity: Not on file  ?Transportation Needs: Not on file  ?Physical Activity: Not on file  ?Stress: Not on file  ?Social Connections: Not on file  ? ?Family History  ?Problem Relation Age of Onset  ? Cancer Mother   ? CVA Mother   ? Cancer Maternal Grandmother   ? Healthy Son   ? Colon cancer Neg Hx   ? ?Outpatient Encounter Medications as of 01/17/2022  ?Medication Sig  ? acetaminophen (TYLENOL) 500 MG tablet Take 1 tablet (500 mg total) by mouth every 6 (six) hours as needed for mild pain or headache.  ? allopurinol (ZYLOPRIM) 300 MG tablet Take 300 mg by mouth daily.  ? amoxicillin-clavulanate (AUGMENTIN) 875-125 MG tablet Take 1 tablet by mouth 2 (two) times daily for 7 days.  ? escitalopram (LEXAPRO) 20 MG tablet Take 20 mg by mouth at bedtime.   ? Etanercept (ENBREL [place]) Inject 1 Dose into  the skin every Monday.  ? folic acid (FOLVITE) 1 MG tablet Take 1 mg by mouth daily.  ? loperamide (IMODIUM) 2 MG capsule Take 1 capsule (2 mg total) by mouth every 6 (six) hours as needed for diarrhea or loose stools.  ? Omega-3 Fatty Acids (FISH OIL) 1200 MG CAPS Take 6,000 mg by mouth daily. Takes 5 capsules every morning  ? ondansetron (ZOFRAN-ODT) 8 MG disintegrating tablet Take 1 tablet (8 mg total) by mouth every 8 (eight) hours as needed for nausea or vomiting.  ? oxyCODONE-acetaminophen (PERCOCET) 10-325 MG tablet Take 1 tablet by mouth every 6 (six) hours as needed for pain.  ? pantoprazole (PROTONIX) 40 MG tablet Take 40 mg by mouth daily.  ? pravastatin (PRAVACHOL) 40 MG tablet Take 40 mg by mouth daily.  ? predniSONE (DELTASONE) 5 MG tablet Take 5 mg by mouth daily with breakfast.  ? propranolol ER (INDERAL LA) 60 MG 24 hr capsule Take 1 capsule (60 mg total) by mouth daily.  ? testosterone cypionate (DEPOTESTOSTERONE CYPIONATE) 200 MG/ML injection Inject 1 mL into the muscle every 28 (twenty-eight) days. (Patient not taking: Reported on 01/17/2022)  ?  traZODone (DESYREL) 50 MG tablet Take 50 mg by mouth at bedtime as needed for sleep.  ? ?No facility-administered encounter medications on file as of 01/17/2022.  ? ?ALLERGIES: ?No Known Allergies ? ?VACCINATION STATUS: ?Immunization History  ?Administered Date(s) Administered  ? DTaP 11/17/2007  ? Influenza, High Dose Seasonal PF 06/09/2014, 05/04/2015, 06/07/2017  ? Influenza, Quadrivalent, Recombinant, Inj, Pf 05/18/2018, 07/01/2019, 07/13/2020  ? Influenza-Unspecified 05/09/2012, 05/28/2013, 06/05/2018  ? Pneumococcal Conjugate-13 05/29/2014  ? Pneumococcal-Unspecified 04/26/2004  ? Zoster, Live 11/16/2011  ? ? ?HPI ?[name] is 76 y.o. male who presents today with a medical history as above. he is being seen in consultation for pituitary macroadenoma requested by Deon Pilling, NP.   ?Patient is accompanied by his wife to clinic.  History is obtained directly from the patient as well as chart review.  He checked in to ER on December 27, 2021 for headache/TIA.  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   ?Patient is known to have hypogonadism, with history of testosterone treatment, not on testosterone anymore ?He is not known to have other hormone deficits.  His prolactin level was 9.4-normal.  His thyroid function tests are were normal.  His a.m. cortisol was normal, however patient is on prednisone 5 mg p.o. daily at breakfast.  ?His other medical problems include rheumatoid arthritis with some significant deformities of upper extremities.  He also has gout, mood shoulders, hyperlipidemia. ?Patient reports he is initial presentations with headache where now resolved.  He denies visual field loss. ?More recently, he was hospitalized for diverticulitis, currently on Augmentin twice  daily. ? ?Review of Systems ? ?Constitutional: + Reports progressive weight loss, no fatigue, no subjective hyperthermia, no subjective hypothermia ?Eyes: no blurry vision, no xerophthalmia ?ENT: no sore throat, no nodules palpated in throat, no dysphagia/odynophagia, no hoarseness ?Cardiovascular: no Chest Pain, no Shortness of Breath, no palpitations, no leg swelling ?Respiratory: no cough, no shortness of breath ?Gastrointestinal: no Nausea/Vomiting/Diarhhea ?Musculoskeletal: no muscle/joint aches ?Skin: no rashes ?Neurological: no tremors, no numbness, no tingling, no dizziness ?Psychiatric: no depression, no anxiety ? ?Objective:  ?  ? ?  01/17/2022  ?  1:21 PM 01/14/2022  ? 12:13 PM 01/14/2022  ?  5:42 AM  ?Vitals with BMI  ?Height '5\' 8"'$     ?Systolic 597 471 855  ?Diastolic 70 72 63  ?Pulse 60 66 72  ? ? ?BP 130/70   Pulse 60   Ht '5\' 8"'$  (1.727 m)   BMI 26.30 kg/m?   ?Wt Readings from Last 3 Encounters:  ?01/09/22 173 lb (78.5 kg)  ?01/03/22 153 lb (69.4 kg)  ?12/27/21 160 lb 15 oz (73 kg)  ?  ?Physical Exam ? ?Constitutional:  Body mass index is 26.3 kg/m?.,  not in acute distress, normal state of mind ?Eyes: PERRLA, EOMI, no exophthalmos ?ENT: moist mucous membranes, no gross thyromegaly, no gross cervical lymphadenopathy ?Cardiovascular: normal precordial activity, Regular Rate and Rhythm, no Murmur/Rubs/Gallops ?Respiratory:  adequate breathing efforts, no gross chest deformity, Clear to auscultation bilaterally ?Gastrointestinal: abdomen soft, Non -tender, No distension, Bowel Sounds present, no gross organomegaly ?Musculoskeletal: + gross bilateral hand deformities due to rheumatoid arthritis, strength intact in all four extremities ?Skin: moist, warm, no rashes ?Neurological: no tremor with outstretched hands, Deep tendon reflexes normal in bilateral lower extremities. ? ?CMP ( most recent) ?CMP  ?   ?Component Value Date/Time  ? NA 138 01/14/2022 0606  ? K 3.9 01/14/2022 0606  ? CL 106 01/14/2022 0606   ? CO2 26 01/14/2022 0606  ? GLUCOSE 98 01/14/2022 0606  ? BUN 37 (H) 01/14/2022 0606  ? CREATININE 2.03 (H) 01/14/2022 0606  ? CALCIUM 7.9 (L) 01/14/2022 0606  ? PROT 6.8 01/09/2022 1019  ? ALBUMIN 3.0 (L) 05/07

## 2022-01-18 DIAGNOSIS — D352 Benign neoplasm of pituitary gland: Secondary | ICD-10-CM | POA: Diagnosis not present

## 2022-01-19 LAB — PROLACTIN: Prolactin: 8.6 ng/mL (ref 4.0–15.2)

## 2022-01-20 NOTE — Telephone Encounter (Signed)
I called and LMVM for Becky with Dr. Arnoldo Morale office. It looks to me they were trying to get a MRI brain on pt while he was in the hospital.  He had done 01-14-2022 and is in Epic to view.  If she need anything else give [place] a call back.  If needs fax let us know.

## 2022-01-20 NOTE — Telephone Encounter (Signed)
[facility]) clarify what the fax is requesting from Dr. Arnoldo Morale. Is there anything he needs to do? Would like a call from the nurse.  Contact info: [phone] ext [phone]

## 2022-01-21 ENCOUNTER — Other Ambulatory Visit: Payer: Self-pay

## 2022-01-21 DIAGNOSIS — D352 Benign neoplasm of pituitary gland: Secondary | ICD-10-CM

## 2022-01-24 ENCOUNTER — Ambulatory Visit: Payer: Medicare Other | Admitting: "Endocrinology

## 2022-01-24 DIAGNOSIS — D352 Benign neoplasm of pituitary gland: Secondary | ICD-10-CM | POA: Diagnosis not present

## 2022-01-25 ENCOUNTER — Other Ambulatory Visit: Payer: Self-pay | Admitting: Neurology

## 2022-01-26 LAB — PROLACTIN, MACROADENOMA
Prolactin on 1:100 Dilution: 18.6 ng/mL
Prolactin: 11.6 ng/mL (ref 4.0–15.2)

## 2022-01-27 ENCOUNTER — Ambulatory Visit (HOSPITAL_COMMUNITY): Payer: Medicare Other | Admitting: Physical Therapy

## 2022-01-27 ENCOUNTER — Encounter: Payer: Self-pay | Admitting: Neurology

## 2022-02-01 ENCOUNTER — Ambulatory Visit: Payer: Self-pay | Admitting: Neurology

## 2022-02-01 ENCOUNTER — Ambulatory Visit: Payer: Medicare Other | Admitting: "Endocrinology

## 2022-02-01 ENCOUNTER — Encounter: Payer: Self-pay | Admitting: "Endocrinology

## 2022-02-01 VITALS — BP 140/76 | HR 56 | Ht 68.0 in | Wt 147.4 lb

## 2022-02-01 DIAGNOSIS — D352 Benign neoplasm of pituitary gland: Secondary | ICD-10-CM

## 2022-02-01 NOTE — Progress Notes (Signed)
02/01/2022, 5:47 PM  Endocrinology follow-up note   Subjective:    Patient ID: [name], male    DOB: 04/02/1946, PCP Deon Pilling, NP   Past Medical History:  Diagnosis Date   Anxiety and depression    Chronic renal insufficiency, stage 3 (moderate) ([place])    DDD (degenerative disc disease), lumbar    remote hx of fusion surgery   GERD (gastroesophageal reflux disease)    Gout    History of stomach ulcers 2000   Hyperlipidemia    Hypertension    Rheumatoid arthritis ([place])    Rheumatoid    Past Surgical History:  Procedure Laterality Date   APPENDECTOMY  1968   back fusion  2000   CHOLECYSTECTOMY N/A 06/06/2017   Procedure: LAPAROSCOPIC CHOLECYSTECTOMY WITH INTRAOPERATIVE CHOLANGIOGRAM;  Surgeon: [name], MD;  Location: [facility];  Service: General;  Laterality: N/A;   EYE SURGERY     Dr. [name]  lens implants   LUMBAR LAMINECTOMY  2000   TIBIA FRACTURE SURGERY Left 1998   with titanium rods   UMBILICAL HERNIA REPAIR N/A 06/06/2017   Procedure: UMBILICAL HERNIA REPAIR;  Surgeon: [name], MD;  Location: [facility];  Service: General;  Laterality: N/A;   Social History   Socioeconomic History   Marital status: Married    Spouse name: Not on file   Number of children: 1   Years of education: Not on file   Highest education level: Not on file  Occupational History   Not on file  Tobacco Use   Smoking status: Former    Packs/day: 2.00    Years: 20.00    Pack years: 40.00    Types: Cigarettes    Quit date: 1980    Years since quitting: 43.4   Smokeless tobacco: Never   Tobacco comments:    Stopped early 4s  Vaping Use   Vaping Use: Never used  Substance and Sexual Activity   Alcohol use: No   Drug use: No   Sexual activity: Yes  Other Topics Concern   Not on file  Social History Narrative   Lives at home with spouse   Right handed   Caffeine: 2 cups/day, tea seldom   Social Determinants of  Radio broadcast assistant Strain: Not on file  Food Insecurity: Not on file  Transportation Needs: Not on file  Physical Activity: Not on file  Stress: Not on file  Social Connections: Not on file   Family History  Problem Relation Age of Onset   Cancer Mother    CVA Mother    Cancer Maternal Grandmother    Healthy Son    Colon cancer Neg Hx    Outpatient Encounter Medications as of 02/01/2022  Medication Sig   acetaminophen (TYLENOL) 500 MG tablet Take 1 tablet (500 mg total) by mouth every 6 (six) hours as needed for mild pain or headache.   allopurinol (ZYLOPRIM) 300 MG tablet Take 300 mg by mouth daily.   escitalopram (LEXAPRO) 20 MG tablet Take 20 mg by mouth at bedtime.    Etanercept (ENBREL [place]) Inject 1 Dose into the skin every Monday.   folic acid (FOLVITE) 1 MG tablet Take 1 mg by mouth daily.   loperamide (IMODIUM)  2 MG capsule Take 1 capsule (2 mg total) by mouth every 6 (six) hours as needed for diarrhea or loose stools.   Omega-3 Fatty Acids (FISH OIL) 1200 MG CAPS Take 6,000 mg by mouth daily. Takes 5 capsules every morning   ondansetron (ZOFRAN-ODT) 8 MG disintegrating tablet Take 1 tablet (8 mg total) by mouth every 8 (eight) hours as needed for nausea or vomiting.   oxyCODONE-acetaminophen (PERCOCET) 10-325 MG tablet Take 1 tablet by mouth every 6 (six) hours as needed for pain.   pantoprazole (PROTONIX) 40 MG tablet Take 40 mg by mouth daily.   pravastatin (PRAVACHOL) 40 MG tablet Take 40 mg by mouth daily.   predniSONE (DELTASONE) 5 MG tablet Take 5 mg by mouth daily with breakfast.   propranolol ER (INDERAL LA) 60 MG 24 hr capsule TAKE 1 CAPSULE BY MOUTH EVERY DAY   testosterone cypionate (DEPOTESTOSTERONE CYPIONATE) 200 MG/ML injection Inject 1 mL into the muscle every 28 (twenty-eight) days. (Patient not taking: Reported on 01/17/2022)   traZODone (DESYREL) 50 MG tablet Take 50 mg by mouth at bedtime as needed for sleep.   No facility-administered encounter  medications on file as of 02/01/2022.   ALLERGIES: No Known Allergies  VACCINATION STATUS: Immunization History  Administered Date(s) Administered   DTaP 11/17/2007   Influenza, High Dose Seasonal PF 06/09/2014, 05/04/2015, 06/07/2017   Influenza, Quadrivalent, Recombinant, Inj, Pf 05/18/2018, 07/01/2019, 07/13/2020   Influenza-Unspecified 05/09/2012, 05/28/2013, 06/05/2018   Pneumococcal Conjugate-13 05/29/2014   Pneumococcal-Unspecified 04/26/2004   Zoster, Live 11/16/2011    [name] is 76 y.o. male who presents today with a medical history as above. he is being seen in follow-up after he was seen in consultation for pituitary macroadenoma requested by Deon Pilling, NP.   Patient is accompanied by his wife to clinic.  History is obtained directly from the patient as well as chart review.  He checked in to ER on December 27, 2021 for headache/TIA.  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   Patient is known to have hypogonadism, with history of testosterone treatment, not on testosterone anymore He is not known to have other hormone deficits.  His prolactin level was 9.4-normal.  His thyroid function tests are were normal.  His a.m. cortisol was normal, however patient is on prednisone 5 mg p.o. daily at breakfast.   More recently, in an attempt to rule out hook effect, he underwent prolactin measurement by dilution and result was 18.6 considered comparable to the result produced on direct testing of the sample. His other medical problems include rheumatoid arthritis with some significant deformities of upper extremities.  He also has gout, mood shoulders, hyperlipidemia. Patient reports he is initial presentations with headache where now resolved.  He  denies visual field loss. More recently, he was hospitalized for diverticulitis, currently on Augmentin twice daily.  Review of Systems  Constitutional: + Reports progressive weight loss, no fatigue, no subjective hyperthermia, no subjective hypothermia Eyes: no blurry vision, no xerophthalmia ENT: no sore throat, no nodules palpated in throat, no dysphagia/odynophagia, no hoarseness Cardiovascular: no Chest Pain, no Shortness of Breath, no palpitations, no leg swelling Respiratory: no cough, no shortness of breath Gastrointestinal: no Nausea/Vomiting/Diarhhea Musculoskeletal: no muscle/joint aches Skin: no rashes Neurological: no tremors, no numbness, no tingling, no dizziness  Psychiatric: no depression, no anxiety  Objective:       02/01/2022    1:00 PM 01/17/2022    1:21 PM 01/14/2022   12:13 PM  Vitals with BMI  Height '5\' 8"'$  '5\' 8"'$    Weight 147 lbs 6 oz    BMI 49.44    Systolic 967 591 638  Diastolic 76 70 72  Pulse 56 60 66    BP 140/76   Pulse (!) 56   Ht '5\' 8"'$  (1.727 m)   Wt 147 lb 6.4 oz (66.9 kg)   BMI 22.41 kg/m   Wt Readings from Last 3 Encounters:  02/01/22 147 lb 6.4 oz (66.9 kg)  01/09/22 173 lb (78.5 kg)  01/03/22 153 lb (69.4 kg)    Physical Exam  Constitutional:  Body mass index is 22.41 kg/m.,  not in acute distress, normal state of mind Eyes: PERRLA, EOMI, no exophthalmos ENT: moist mucous membranes, no gross thyromegaly, no gross cervical lymphadenopathy Cardiovascular: normal precordial activity, Regular Rate and Rhythm, no Murmur/Rubs/Gallops Respiratory:  adequate breathing efforts, no gross chest deformity, Clear to auscultation bilaterally Gastrointestinal: abdomen soft, Non -tender, No distension, Bowel Sounds present, no gross organomegaly Musculoskeletal: + gross bilateral hand deformities due to rheumatoid arthritis, strength intact in all four extremities Skin: moist, warm, no rashes Neurological: no tremor with outstretched hands, Deep  tendon reflexes normal in bilateral lower extremities.  CMP ( most recent) CMP     Component Value Date/Time   NA 138 01/14/2022 0606   K 3.9 01/14/2022 0606   CL 106 01/14/2022 0606   CO2 26 01/14/2022 0606   GLUCOSE 98 01/14/2022 0606   BUN 37 (H) 01/14/2022 0606   CREATININE 2.03 (H) 01/14/2022 0606   CALCIUM 7.9 (L) 01/14/2022 0606   PROT 6.8 01/09/2022 1019   ALBUMIN 3.0 (L) 01/09/2022 1019   AST 38 01/09/2022 1019   ALT 20 01/09/2022 1019   ALKPHOS 70 01/09/2022 1019   BILITOT 1.1 01/09/2022 1019   GFRNONAA 33 (L) 01/14/2022 0606   GFRAA 42 (L) 10/31/2017 1147        Lab Results  Component Value Date   TSH 1.770 01/03/2022   FREET4 0.91 01/03/2022    Endocrine labs: A.m. cortisol on Jan 11, 2022-11.4 Prolactin 9.4, FSH 4.4, LH 2.6, IGF 57 Testosterone on Jan 03, 2022 low at 14. 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  His prolactin by 1: 100 dilution was 18.6 ng per mL.  This is considered comparable to the result produced on direct testing of the sample.  It rules out significant hook effect.   - I have reviewed his available pituitary MRI, and endocrine work-up and clinically evaluated the patient. 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  He is unlikely to respond to dopamine receptor agonist treatment.  He has hypogonadism may not be related to his pituitary macroadenoma.  He was consulted with a neurosurgeon.  It seems reasonable to keep this patient on observation with plan to measure hormones and MRI if needed in 6 months.   He is instructed to report if he develops visual field deficit, changing headaches or any significant CNS symptoms.  If he becomes symptomatic, and he does not have hyperprolactinemia, he he is a candidate for  neurosurgery.  He is not on thyroid nor testosterone replacement at this time.  He is on ongoing prednisone treatment likely related to his rheumatoid arthritis.  His recent a.m. cortisol was normal.  It is difficult to assess for adrenal sufficiency while he is taking prednisone.  For now, he is advised to continue with prednisone  - I did not initiate any new prescriptions today. - he is advised to maintain close follow up with Deon Pilling, NP for primary care needs.   I spent 30 minutes in the care of the patient today including review of labs from Thyroid Function, CMP, and other relevant labs ; imaging/biopsy records (current and previous including abstractions from other facilities); face-to-face time discussing  his lab results and symptoms, medications doses, his options of short and long term treatment based on the latest standards of care / guidelines;   and documenting the encounter.  [name]  participated in the discussions, expressed understanding, and voiced agreement with the above plans.  All questions were answered to his satisfaction. he is encouraged to contact clinic should he have any questions or concerns prior to his return visit.   Follow up plan: Return in about 6 months (around 08/04/2022) for F/U with Pre-visit Labs.   Glade Lloyd, MD [facility] [address] [place], [place] [address] Phone: [phone]  Fax: [phone]     02/01/2022, 5:47 PM  This note was partially dictated with voice recognition software. Similar sounding words can be transcribed inadequately or may not  be corrected upon review.

## 2022-02-02 ENCOUNTER — Telehealth: Payer: Self-pay | Admitting: Neurology

## 2022-02-02 ENCOUNTER — Ambulatory Visit (INDEPENDENT_AMBULATORY_CARE_PROVIDER_SITE_OTHER): Payer: Medicare Other | Admitting: Neurology

## 2022-02-02 DIAGNOSIS — D352 Benign neoplasm of pituitary gland: Secondary | ICD-10-CM

## 2022-02-02 DIAGNOSIS — R519 Headache, unspecified: Secondary | ICD-10-CM

## 2022-02-02 NOTE — Patient Instructions (Signed)
Continue current medications See eye doctor, neurosugery Will call in julyto get repeat MRI ordered Call us for any new symptoms Pituitary Tumors  Pituitary tumors are abnormal growths found in the pituitary gland. The pituitary gland is a pea-sized gland located in the base of the skull, behind the bridge of the nose. It makes hormones that affect growth and the functions of other glands in the body. In most cases, pituitary tumors grow slowly, are not cancerous (are benign), and do not spread to other parts of the body. These tumors are best treated when they are found and diagnosed early. A pituitary tumor may produce extra hormones (functioning tumor) or not (non-functioning tumor). A pituitary tumor may cause: Cushing disease. In this disease, the pituitary gland produces too much of a hormone called adrenocorticotropic hormone (ACTH). This hormone causes the adrenal glands to make high levels of another hormone called cortisol. This causes fat to build up in the face, back, and chest while the arms and legs become thin. Acromegaly. This is a condition in which the hands, feet, and face are larger than normal. Breast milk production, even when there is no pregnancy. What are the causes? The cause of most pituitary tumors is not known. In some cases, pituitary tumors may be passed from parent to child (inherited). What increases the risk? You are more likely to develop this condition if: You have a family history of pituitary tumors. You have certain syndromes caused by unwanted changes (mutations) in your genes. These include multiple endocrine neoplasia type 1 (MEN1) and 4 (MEN4), McCune-Albright syndrome, and Carney complex. What are the signs or symptoms? Symptoms depend on the size of the tumor and the specific hormones that a functioning tumor makes. Symptoms may include: Headaches. Seizures or loss of consciousness. Vision problems and eye muscle weakness. Weakness or low  energy. Clear fluid draining from the nose. Changes in the sense of smell. Loss of body hair. Nausea and vomiting. Problems caused by the production of too many hormones, such as: Inability to get pregnant after a year of having sex regularly without using birth control (infertility). Loss of menstrual periods in women. Abnormal growth. Diabetes insipidus or diabetes mellitus. High blood pressure (hypertension). Inability to tolerate heat or cold. Increase in sweating. Joint pain. Other skin and body changes. Nipple discharge. Changes in mood, or depression. Decreased sexual function. In some cases, there are no symptoms. How is this diagnosed? This condition may be diagnosed based on: A physical exam and your medical history. Blood or urine tests to check your hormone levels. Imaging tests such as a CT or MRI scan. Removal and examination of a small tumor tissue (biopsy). How is this treated? Treatment depends on the type of pituitary tumor you have and your overall health. Treatments may include: Surgical removal of the tumor. Using high doses of X-ray energy to kill tumor cells (radiation). Using certain medicines to stop the pituitary gland from producing too many hormones. Using certain medicines to kill abnormal or cancerous cells (chemotherapy) or to treat symptoms from the tumor. If you have a family history of pituitary tumors, you may need to have regular blood tests to monitor pituitary hormone levels. Follow these instructions at home: If directed, follow instructions from your health care provider about measuring how much urine you pass. Drink enough fluid to keep your urine pale yellow. If your nose is draining clear fluid, do not pick your nose or remove any crusting. Tell your health care provider if this condition  worsens. Do not do any activities that require straining, such as heavy lifting. Ask your health care provider what activities are safe for you. Take  over-the-counter and prescription medicines only as told by your health care provider. Keep all follow-up visits. This is important, especially if you have a family history of pituitary tumors and you need regular blood tests. Where to find more information [place]: www.cancer.gov Contact a health care provider if: You have sudden, unusual thirst. You are urinating more often than usual. You have a headache that does not go away. You develop new changes in your vision. You have new or increased clear fluid leaking from your nose or ears. You have a sensation of fluid trickling down the back of your throat. You have a salty taste in your mouth. You have trouble concentrating. Get help right away if: Your symptoms suddenly become severe. You have a nosebleed that does not stop after a few minutes. You have a fever of over 101F (38.3C). You have a severe headache. You have a stiff neck. You are confused or not as alert as usual. You have chest pain. You have shortness of breath. These symptoms may represent a serious problem that is an emergency. Do not wait to see if the symptoms will go away. Get medical help right away. Call your local emergency services (911 in the [place] Do not drive yourself to the hospital. Summary Pituitary tumors are abnormal growths found in the pituitary gland. Depending on the tumor type, this can cause your body to make excessive amounts of certain hormones. Symptoms of a pituitary tumor depend on the size of the tumor and the specific hormones that a functioning tumor makes. Some tumors may not cause symptoms. Treatment depends on the type of pituitary tumor you have and your overall health. Keep all follow-up visits. This is important, especially if you have a family history of pituitary tumors and you need regular blood tests. This information is not intended to replace advice given to you by your health care provider. Make sure you  discuss any questions you have with your health care provider. Document Revised: 07/28/2020 Document Reviewed: 07/28/2020 Elsevier Patient Education  [place].

## 2022-02-02 NOTE — Telephone Encounter (Signed)
Guy Begin, can you call patient and set him up within a appointment with me for follow-up for pituitary adenoma August 15 or around mid to late August.  Thank you

## 2022-02-02 NOTE — Progress Notes (Signed)
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  Location: Patient: home Provider: office   I discussed the limitations, risks, security and privacy concerns of performing an evaluation and management service by telephone and the availability of in person appointments. I also discussed with the patient that there may be a patient responsible charge related to this service. The patient expressed understanding and agreed to proceed.   Follow Up Instructions:    I discussed the assessment and treatment plan with the patient. The patient was provided an opportunity to ask questions and all were answered. The patient agreed with the plan and demonstrated an understanding of the instructions.   The patient was advised to call back or seek an in-person evaluation if the symptoms worsen or if the condition fails to improve as anticipated.  I provided over 22 minutes of non-face-to-face time during this encounter.   Christian Beam, MD   Follow-up Feb 02, 2022: Since patient has been seen, he has been admitted into the hospital, repeat MRI of the brain showed a stable pituitary adenoma, he is also been seen by endocrinology and neurosurgery. neurosurgery. Still pending ophthalmology. He saw endocrinology and the tumor would not be responsive to any medication so if it grows will need to be removal. His headache is better. He is seeing ophthalmology June 12th, Seeing Dr. Arlyce Sparks eye doctor, they will send me a note. He does not feel his vision is worsening. Hasn;t had a headache since leaving the hospital. Repeat MRI brain was stable. Would repeat in  3-4 months unless having symptoms. So far no new symptoms. We will call to repeat it in 3 . Seeing NSY in August will repeat in prior to neurosurgery 4th. We will repeat MRI early August. Will call the early July to remind him that repeat it and schedule it late July in preparation for neurosurgery appointment. He feels good right now  Patient complains of symptoms per HPI as well as the following symptoms: diverticulitis . Pertinent negatives and positives per HPI. All others negative  Repeat MRI brain/orbits 01/14/2022: MRI ORBITS FINDINGS   Orbits: Very limited by motion. No detected optic nerve edema. Unremarkable appearance of the globes, orbital fat, extraocular muscles, and lacrimal glands.   Visualized sinuses: Mild mucosal thickening in ethmoid sinuses.   Soft tissues: Negative   IMPRESSION: 1. Significantly motion degraded MRI of the brain and orbits. 2. Unchanged appearance of pituitary macro adenoma when compared to the MRI last month. No optic nerve mass effect or signs of apoplexy. 3. No acute finding in the brain or orbits. 4. Chronic small vessel ischemia.  Personally reviewed images additional 5 minutes (in addition to appointment time otp today)   HPI:  [name] is a 76 y.o. male here as requested by Christian Pilling, NP for follow-up after being seen in the emergency room.  He has a past medical history of hypertension, hyperlipidemia, CKD stage III, anxiety depression and GERD.  He presented recently after being found to be acutely altered, having constant headache for over a month but acutely worse over the several last week, pain behind the eyes but usually  worse on the right, sensitivity to light and sound, home pain medication such as Tylenol Benadryl without relief, associated symptoms dizziness nausea and reportedly an episode of vomiting.  No other significant symptoms such as fever shortness of breath cough chest pain or diarrhea.  He went to see his eye doctor  who sent him to the emergency room as he was disoriented and unable to write his name, EMS was called.  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  Neurology was consulted and given a migraine cocktail which abated his headache.  Ophthalmology was consulted and he was found to have a newly diagnosed pituitary mass with bitemporal field cut mild disc edema.  His ophthalmology exam showed no emergent or otherwise medical issues or ocular issues to explain his pain.  Ophthalmology recommended formal visual field testing on outpatient basis and establishment with neurosurgery  Patient reports headaches ongoing for the past 2 to 3 months in the setting of pituitary macroadenoma, present in the morning lasting all day worsening around noon, no worsening headaches with increased intracranial pressure such as bowel movements, changing positions waking up in the middle the night coughing or sneezing, they are located frontally and periorbitally with right worse than left, nausea with vomiting, associated photophobia and phonophobia, transient blurriness and dizziness but no diplopia. Today it is throbbng around the eyes, nausea, vomiting, blurry vision, no drainage from nipples, migraine cocktail stopped it, came back the next day. He takes 2 aspirin and 2 pain pills this morning. Son is here and provides information. Pulsating/pounding/light sensitivity. Blurry vision. Pain is severe. Better when laying down.     Reviewed notes, labs and imaging from outside physicians, which showed: MRI HEAD FINDINGS   Brain: No acute infarction, hemorrhage, hydrocephalus, or extra-axial fluid collection. Approximately 1.3 cm pituitary mass with suprasellar extension. This mass comes in close proximity to the optic chiasm. Moderate patchy  T2/FLAIR hyperintensities in the white matter, nonspecific but compatible with chronic microvascular ischemic disease. Cerebral atrophy.   Vascular: Major arterial flow voids are maintained skull base.   Skull and upper cervical spine: Normal marrow signal.   Other: No mastoid effusions.   MRI ORBITS FINDINGS   Orbits: No orbital mass or evidence of inflammation. Normal appearance of the globes, optic nerve-sheath complexes, extraocular muscles, and lacrimal glands. Prominent retro bulbar fat bilaterally.   Visualized sinuses: Mild-to-moderate paranasal sinus mucosal thickening.   Soft tissues: Normal.   IMPRESSION: Due to patient pain, postcontrast imaging was not performed and study was terminated early. Within this limitation:   1. Approximately 1.3 cm pituitary mass with suprasellar extension. This mass comes in close proximity to the optic chiasm. Recommend dedicated pituitary protocol MRI with contrast for further characterization. 2. Otherwise, no evidence of acute intracranial or orbital abnormality. 3. 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    Review of records, medications tried that can be used in migraine/headache management include: Tylenol, Decadron, Fioricet, Lexapro, losartan, meloxicam magnesium, Zofran, prednisone, Compazine, nortriptyline/amitriptyline contraindicated due to age and risk of altered mental status due to tricyclic antidepressants, compazine, phenergan, trazodone  Review of Systems: Patient complains of symptoms per HPI as well as the following symptoms severe eye pain and headache. Pertinent negatives and positives per HPI. All others negative.   Social History   Socioeconomic History  Marital status: Married    Spouse name: Not on file   Number of children: 1   Years of education: Not on file   Highest education level: Not on file  Occupational History   Not on file  Tobacco Use   Smoking status: Former     Packs/day: 2.00    Years: 20.00    Pack years: 40.00    Types: Cigarettes    Quit date: 89    Years since quitting: 43.4   Smokeless tobacco: Never   Tobacco comments:    Stopped early 35s  Vaping Use   Vaping Use: Never used  Substance and Sexual Activity   Alcohol use: No   Drug use: No   Sexual activity: Yes  Other Topics Concern   Not on file  Social History Narrative   Lives at home with spouse   Right handed   Caffeine: 2 cups/day, tea seldom   Social Determinants of Health   Financial Resource Strain: Not on file  Food Insecurity: Not on file  Transportation Needs: Not on file  Physical Activity: Not on file  Stress: Not on file  Social Connections: Not on file  Intimate Partner Violence: Not on file    Family History  Problem Relation Age of Onset   Cancer Mother    CVA Mother    Cancer Maternal Grandmother    Healthy Son    Colon cancer Neg Hx     Past Medical History:  Diagnosis Date   Anxiety and depression    Chronic renal insufficiency, stage 3 (moderate) (HCC)    DDD (degenerative disc disease), lumbar    remote hx of fusion surgery   GERD (gastroesophageal reflux disease)    Gout    History of stomach ulcers 2000   Hyperlipidemia    Hypertension    Rheumatoid arthritis ([place])    Rheumatoid     Patient Active Problem List   Diagnosis Date Noted   Malnutrition of moderate degree 35/45/6256   Acute metabolic encephalopathy 38/93/7342   Acute diverticulitis 01/09/2022   Acute renal failure superimposed on stage 3b chronic kidney disease ([place]) 01/09/2022   Pituitary macroadenoma ([place]) 01/03/2022   Chronic daily headache 01/03/2022   Altered mental status 12/27/2021   Headache 12/27/2021   Periorbital pain, bilateral 12/27/2021   Anxiety and depression 12/27/2021   Chronic gouty arthritis 04/23/2021   Chronic pain 04/23/2021   Essential hypertension 04/23/2021   Fatigue 04/23/2021   Hyperlipidemia 04/23/2021   Insomnia 04/23/2021    Rheumatoid arthritis ([place]) 04/23/2021   Intermediate stage nonexudative age-related macular degeneration of both eyes 07/22/2020   Posterior vitreous detachment of right eye 07/22/2020   Vitreomacular adhesion of left eye 07/22/2020   Cholelithiasis with chronic cholecystitis 87/68/1157   Umbilical hernia 26/20/3559   Cholelithiasis 05/09/2017   GERD 10/15/2010   NAUSEA WITH VOMITING 10/15/2010   COLONIC POLYPS 11/25/2008   GASTRIC ULCER 11/25/2008   DUODENAL ULCER 11/25/2008   DIVERTICULOSIS, COLON 11/25/2008   ARTHRITIS, RHEUMATOID 11/25/2008   NAUSEA 11/25/2008   ABDOMINAL PAIN-EPIGASTRIC 11/25/2008   ABDOMINAL PAIN -GENERALIZED 11/25/2008    Past Surgical History:  Procedure Laterality Date   APPENDECTOMY  1968   back fusion  2000   CHOLECYSTECTOMY N/A 06/06/2017   Procedure: LAPAROSCOPIC CHOLECYSTECTOMY WITH INTRAOPERATIVE CHOLANGIOGRAM;  Surgeon: [name], MD;  Location: [facility];  Service: General;  Laterality: N/A;   EYE SURGERY     Dr. [name]  lens implants   LUMBAR LAMINECTOMY  2000  TIBIA FRACTURE SURGERY Left 1998   with titanium rods   UMBILICAL HERNIA REPAIR N/A 06/06/2017   Procedure: UMBILICAL HERNIA REPAIR;  Surgeon: [name], MD;  Location: [facility];  Service: General;  Laterality: N/A;    Current Outpatient Medications  Medication Sig Dispense Refill   acetaminophen (TYLENOL) 500 MG tablet Take 1 tablet (500 mg total) by mouth every 6 (six) hours as needed for mild pain or headache. 30 tablet 0   allopurinol (ZYLOPRIM) 300 MG tablet Take 300 mg by mouth daily.     escitalopram (LEXAPRO) 20 MG tablet Take 20 mg by mouth at bedtime.      Etanercept (ENBREL [place]) Inject 1 Dose into the skin every Monday.     folic acid (FOLVITE) 1 MG tablet Take 1 mg by mouth daily.     loperamide (IMODIUM) 2 MG capsule Take 1 capsule (2 mg total) by mouth every 6 (six) hours as needed for diarrhea or loose stools. 20 capsule 0   Omega-3 Fatty Acids (FISH OIL) 1200 MG  CAPS Take 6,000 mg by mouth daily. Takes 5 capsules every morning     ondansetron (ZOFRAN-ODT) 8 MG disintegrating tablet Take 1 tablet (8 mg total) by mouth every 8 (eight) hours as needed for nausea or vomiting. 30 tablet 6   oxyCODONE-acetaminophen (PERCOCET) 10-325 MG tablet Take 1 tablet by mouth every 6 (six) hours as needed for pain. 60 tablet 0   pantoprazole (PROTONIX) 40 MG tablet Take 40 mg by mouth daily.     pravastatin (PRAVACHOL) 40 MG tablet Take 40 mg by mouth daily.     predniSONE (DELTASONE) 5 MG tablet Take 5 mg by mouth daily with breakfast.     propranolol ER (INDERAL LA) 60 MG 24 hr capsule TAKE 1 CAPSULE BY MOUTH EVERY DAY 30 capsule 1   traZODone (DESYREL) 50 MG tablet Take 50 mg by mouth at bedtime as needed for sleep.     No current facility-administered medications for this visit.    Allergies as of 02/02/2022   (No Known Allergies)    Vitals: There were no vitals taken for this visit. Last Weight:  Wt Readings from Last 1 Encounters:  02/01/22 147 lb 6.4 oz (66.9 kg)   Last Height:   Ht Readings from Last 1 Encounters:  02/01/22 '5\' 8"'$  (1.727 m)   OTP Today: Physical exam: Exam: Gen: NAD, conversant,                       Neuro: Detailed Neurologic Exam  Speech:    Speech is normal; fluent and spontaneous with normal comprehension.  Cognition:    The patient is oriented to person, place, and time;     recent and remote memory intact;     language fluent;     normal attention, concentration,     fund of knowledge  PRIOR EXAMINATION  Physical exam: Exam: Gen: NAD, conversant, well nourised, well groomed                     CV: RRR, no MRG. No Carotid Bruits. No peripheral edema, warm, nontender Eyes: Conjunctivae clear without exudates or hemorrhage  Neuro: Detailed Neurologic Exam  Speech:    Speech is normal; fluent and spontaneous with normal comprehension.  Cognition:    The patient is oriented to person, place, and time;      recent and remote memory intact;     language fluent;  normal attention, concentration,     fund of knowledge Cranial Nerves:    The pupils are equal, round, and reactive to light. Pupils too small and photosensitive, difficulty visualizing fundi Decreased left upper quadrant peripheral vision. Extraocular movements are intact. Trigeminal sensation is intact and the muscles of mastication are normal. The face is symmetric. The palate elevates in the midline. Hearing intact. Voice is normal. Shoulder shrug is normal. The tongue has normal motion without fasciculations.   Coordination:    Normal   Gait:    normal.   Motor Observation:    No asymmetry, no atrophy, and no involuntary movements noted. Tone:    Normal muscle tone.    Posture:    Posture is normal. normal erect    Strength:    Strength is V/V in the upper and lower limbs.      Sensation: intact to LT     Reflex Exam:  DTR's:    Deep tendon reflexes in the upper and lower extremities are symmetrical bilaterally.   Toes:    The toes are equivocal bilaterally.   Clonus:    Clonus is absent.    Assessment/Plan:   76 y.o. male here as requested by [name], MD for follow-up after being seen in the emergency room.  He has a past medical history of hypertension, hyperlipidemia, CKD stage III, anxiety depression and GERD.  He presented recently after being found to be acutely altered, having constant headache for over a month(NO HISTORY OF MIGRAINES) but acutely worse over the several last week, pain behind the eyes but usually worse on the right, sensitivity to light and sound, home pain medication such as Tylenol Benadryl without relief, associated symptoms dizziness nausea and reportedly an episode of vomiting.  - Headaches improved. Repeat MRI stable. Reorder MRi pituitary protocol in July for preparation for NSY appt August 4th, sent a reminder message to myself first of July to order - See eye Doctor for visual  field testing, regularly, asked him to send [place] the notes - Will get appointment with me in mid August,sent front staff message to call - asked him to call us for any changes especially vision, headaches or anything concerning so we can repeat sooner if needed  -Pituitary macroadenoma, extrasellar extension, visual field cut, eye pain, new onset headache: Newly diagnosed pituitary mass with a bitemporal field cut and mild disc edema.  But his inpatient ophthalmology exam showed by report no emergent medical issues or ocular issues that may explain his pain.  Ophthalmology recommended formal visual testing on outpatient basis for his chronic eye disease and establishment with an eye care provider. See his eye doctor asap -Neurology was consulted and given his pituitary macroadenoma and is an MRI of the brain was done and did not cause significant compression of his optic apparatus based on MRI, Dr. Arnoldo Morale did not feel significant compression and no surgically presently I recommended follow-up outpatient for visual testing and endocrinology work-up.  MRI of the brain and orbits showed a 1.3 cm pituitary mass with suprasellar extension coming in close proximity to the optic chiasm but not appearing to compress, otherwise no evidence of acute intracranial or orbital abnormality on MRI of the orbits, chronic microvascular ischemic disease and cerebral atrophy. - can try propranolol or Topiramate or the new cgrp medications. Pituitary tumor patients with vascular headaches are generally quite responsive to standard prophylactic migraine drugs (e.g. tricyclic antidepressants, verapamil, beta-blockers). Will try propranolol. Limited oxycodone.  - avoid triptans due  to risk of pituitary apolpexy    Cc: Christian Pilling, NP,  Christian Pilling, NP  Sarina Ill, MD  [facility] [address] [place], [place] [address]  Phone [phone] Fax [phone]

## 2022-02-07 ENCOUNTER — Ambulatory Visit (HOSPITAL_COMMUNITY): Payer: Medicare Other | Admitting: Occupational Therapy

## 2022-02-09 DIAGNOSIS — R140 Abdominal distension (gaseous): Secondary | ICD-10-CM | POA: Diagnosis not present

## 2022-02-09 DIAGNOSIS — K5901 Slow transit constipation: Secondary | ICD-10-CM | POA: Diagnosis not present

## 2022-02-09 DIAGNOSIS — Z09 Encounter for follow-up examination after completed treatment for conditions other than malignant neoplasm: Secondary | ICD-10-CM | POA: Diagnosis not present

## 2022-02-14 DIAGNOSIS — Z961 Presence of intraocular lens: Secondary | ICD-10-CM | POA: Diagnosis not present

## 2022-02-14 DIAGNOSIS — H353132 Nonexudative age-related macular degeneration, bilateral, intermediate dry stage: Secondary | ICD-10-CM | POA: Diagnosis not present

## 2022-02-14 DIAGNOSIS — H5213 Myopia, bilateral: Secondary | ICD-10-CM | POA: Diagnosis not present

## 2022-02-14 DIAGNOSIS — K5901 Slow transit constipation: Secondary | ICD-10-CM | POA: Diagnosis not present

## 2022-02-14 DIAGNOSIS — D649 Anemia, unspecified: Secondary | ICD-10-CM | POA: Diagnosis not present

## 2022-02-14 DIAGNOSIS — H524 Presbyopia: Secondary | ICD-10-CM | POA: Diagnosis not present

## 2022-02-14 DIAGNOSIS — H52203 Unspecified astigmatism, bilateral: Secondary | ICD-10-CM | POA: Diagnosis not present

## 2022-02-14 DIAGNOSIS — D352 Benign neoplasm of pituitary gland: Secondary | ICD-10-CM | POA: Diagnosis not present

## 2022-02-19 ENCOUNTER — Other Ambulatory Visit: Payer: Self-pay | Admitting: Neurology

## 2022-03-15 DIAGNOSIS — M47816 Spondylosis without myelopathy or radiculopathy, lumbar region: Secondary | ICD-10-CM | POA: Diagnosis not present

## 2022-03-17 ENCOUNTER — Other Ambulatory Visit: Payer: Self-pay | Admitting: Neurology

## 2022-03-28 ENCOUNTER — Telehealth: Payer: Self-pay | Admitting: Neurology

## 2022-03-28 ENCOUNTER — Other Ambulatory Visit: Payer: Self-pay | Admitting: Neurology

## 2022-03-28 DIAGNOSIS — E236 Other disorders of pituitary gland: Secondary | ICD-10-CM

## 2022-03-28 DIAGNOSIS — H547 Unspecified visual loss: Secondary | ICD-10-CM

## 2022-03-28 DIAGNOSIS — D352 Benign neoplasm of pituitary gland: Secondary | ICD-10-CM

## 2022-03-28 NOTE — Telephone Encounter (Signed)
Please call patient: Patient follows up with me in august. At last visit he had a follow upw ith neurosurgery aug 4th and we wanted to repeat his MRI with pituiytary protocol prior to his neurosurgery appointment. However if he is table and feeling well we can wait, I would like a repeat to monitor his large pituitary mass however. If he is ok with me reordering it please let me know and we can try to get it done befoire his NSY appot (is it still aug4th?) thanks

## 2022-03-28 NOTE — Telephone Encounter (Signed)
I called pt and relayed that Dr. Jaynee Eagles wanted to repeat the MRI pituitary on pt to f/u on the pituitary mass.  He stated the NS appt with Dr. Arnoldo Morale was moved up to 04-05-2022 at 1630.  He was ok to proceed.

## 2022-03-29 ENCOUNTER — Telehealth: Payer: Self-pay | Admitting: Neurology

## 2022-03-29 NOTE — Telephone Encounter (Signed)
[employer] medicare NPR sent to GI 

## 2022-04-06 ENCOUNTER — Ambulatory Visit
Admission: RE | Admit: 2022-04-06 | Discharge: 2022-04-06 | Disposition: A | Discharge status: Home / Self Care | Payer: Medicare Other | Source: Ambulatory Visit | Attending: Neurology | Admitting: Neurology

## 2022-04-06 DIAGNOSIS — E236 Other disorders of pituitary gland: Secondary | ICD-10-CM | POA: Diagnosis not present

## 2022-04-06 DIAGNOSIS — H547 Unspecified visual loss: Secondary | ICD-10-CM | POA: Diagnosis not present

## 2022-04-06 DIAGNOSIS — D352 Benign neoplasm of pituitary gland: Secondary | ICD-10-CM

## 2022-04-06 MED ORDER — GADOBENATE DIMEGLUMINE 529 MG/ML IV SOLN
7.0000 mL | Freq: Once | INTRAVENOUS | Status: AC | PRN
  Administered 2022-04-06: 7 mL via INTRAVENOUS

## 2022-04-13 DIAGNOSIS — M47816 Spondylosis without myelopathy or radiculopathy, lumbar region: Secondary | ICD-10-CM | POA: Diagnosis not present

## 2022-04-15 ENCOUNTER — Telehealth: Payer: Self-pay | Admitting: Internal Medicine

## 2022-04-15 NOTE — Telephone Encounter (Signed)
Error

## 2022-04-20 ENCOUNTER — Ambulatory Visit: Payer: Medicare Other | Admitting: Neurology

## 2022-04-23 ENCOUNTER — Other Ambulatory Visit: Payer: Self-pay | Admitting: Neurology

## 2022-05-11 DIAGNOSIS — M1990 Unspecified osteoarthritis, unspecified site: Secondary | ICD-10-CM | POA: Diagnosis not present

## 2022-05-11 DIAGNOSIS — N289 Disorder of kidney and ureter, unspecified: Secondary | ICD-10-CM | POA: Diagnosis not present

## 2022-05-11 DIAGNOSIS — K219 Gastro-esophageal reflux disease without esophagitis: Secondary | ICD-10-CM | POA: Diagnosis not present

## 2022-05-11 DIAGNOSIS — Z79899 Other long term (current) drug therapy: Secondary | ICD-10-CM | POA: Diagnosis not present

## 2022-05-11 DIAGNOSIS — M549 Dorsalgia, unspecified: Secondary | ICD-10-CM | POA: Diagnosis not present

## 2022-05-11 DIAGNOSIS — M1A09X0 Idiopathic chronic gout, multiple sites, without tophus (tophi): Secondary | ICD-10-CM | POA: Diagnosis not present

## 2022-05-11 DIAGNOSIS — E785 Hyperlipidemia, unspecified: Secondary | ICD-10-CM | POA: Diagnosis not present

## 2022-05-11 DIAGNOSIS — I10 Essential (primary) hypertension: Secondary | ICD-10-CM | POA: Diagnosis not present

## 2022-05-11 DIAGNOSIS — M0589 Other rheumatoid arthritis with rheumatoid factor of multiple sites: Secondary | ICD-10-CM | POA: Diagnosis not present

## 2022-05-16 DIAGNOSIS — M47816 Spondylosis without myelopathy or radiculopathy, lumbar region: Secondary | ICD-10-CM | POA: Diagnosis not present

## 2022-05-18 ENCOUNTER — Other Ambulatory Visit: Payer: Self-pay | Admitting: Neurology

## 2022-05-23 ENCOUNTER — Telehealth: Payer: Self-pay | Admitting: Neurology

## 2022-05-23 NOTE — Telephone Encounter (Signed)
MRI is stable no changes since April, the tumor has not gotten any bigger. I hope this helps neurosurgery when you see them thanks. Dr. Jaynee Eagles  Written by Melvenia Beam, MD on 04/10/2022  8:34 PM EDT Seen by patient [name] on 04/11/2022  9:48 AM

## 2022-05-23 NOTE — Telephone Encounter (Signed)
Tried to call the patient back. Phone rang a few times. Received message saying VM has not been setup. Will try again later.

## 2022-05-23 NOTE — Telephone Encounter (Signed)
Pt states it was about a month ago that he had his MRI, he would like a call with results

## 2022-05-23 NOTE — Telephone Encounter (Signed)
I called pt.  I relayed the MRI results that he had seen 04-11-2022 (although he is not computer saavy and was not aware of results).  I relayed the results to him "MRI is stable no changes since April, the tumor has not gotten any bigger".  Hopes it helps neurosurgery when you see them.  I relayed that he needs to call neurosurgery and see when they see him.  (Do they follow him with MRI's serially)? Do we see him again in follow up?

## 2022-05-25 DIAGNOSIS — E785 Hyperlipidemia, unspecified: Secondary | ICD-10-CM | POA: Diagnosis not present

## 2022-05-25 DIAGNOSIS — R7303 Prediabetes: Secondary | ICD-10-CM | POA: Diagnosis not present

## 2022-05-25 DIAGNOSIS — Z79899 Other long term (current) drug therapy: Secondary | ICD-10-CM | POA: Diagnosis not present

## 2022-06-01 DIAGNOSIS — R519 Headache, unspecified: Secondary | ICD-10-CM | POA: Diagnosis not present

## 2022-06-01 DIAGNOSIS — D352 Benign neoplasm of pituitary gland: Secondary | ICD-10-CM | POA: Diagnosis not present

## 2022-06-01 DIAGNOSIS — G8929 Other chronic pain: Secondary | ICD-10-CM | POA: Diagnosis not present

## 2022-06-01 DIAGNOSIS — M1A0790 Idiopathic chronic gout, unspecified ankle and foot, without tophus (tophi): Secondary | ICD-10-CM | POA: Diagnosis not present

## 2022-06-01 DIAGNOSIS — M0589 Other rheumatoid arthritis with rheumatoid factor of multiple sites: Secondary | ICD-10-CM | POA: Diagnosis not present

## 2022-06-01 DIAGNOSIS — G629 Polyneuropathy, unspecified: Secondary | ICD-10-CM | POA: Diagnosis not present

## 2022-06-01 DIAGNOSIS — E7800 Pure hypercholesterolemia, unspecified: Secondary | ICD-10-CM | POA: Diagnosis not present

## 2022-06-01 DIAGNOSIS — K219 Gastro-esophageal reflux disease without esophagitis: Secondary | ICD-10-CM | POA: Diagnosis not present

## 2022-06-01 DIAGNOSIS — I10 Essential (primary) hypertension: Secondary | ICD-10-CM | POA: Diagnosis not present

## 2022-06-06 ENCOUNTER — Other Ambulatory Visit: Payer: Self-pay | Admitting: "Endocrinology

## 2022-06-11 ENCOUNTER — Other Ambulatory Visit: Payer: Self-pay | Admitting: Neurology

## 2022-06-15 NOTE — Progress Notes (Addendum)
Patient: [name] Date of Birth: July 31, 1946  Reason for Visit: Follow up History from: Patient Primary Neurologist: Jaynee Eagles     ASSESSMENT AND PLAN 76 y.o. year old male   55.  Pituitary macroadenoma, extrasellar extension, visual field cut 2.  Headache  -MRI of the brain in August 2023 showed no change, did encourage him to keep f/u visit as scheduled with endocrinology next month, unable to see NS notes via epic, will call office to see when prior OV was and if notes can be obtained.  -Plan on repeat MRI brain in 10/2021 for 17-monthsurveillance monitoring per Dr. AJaynee Eaglesrecommendations -Continue propranolol 60 mg nightly for headache prevention, refill provided, in future if needed emergent medication as needed, triptans will need to be avoided due to risk of pituitary apoplexy -will f/u in 6 months or sooner if needed      HISTORY OF PRESENT ILLNESS:  Chief Complaint  Patient presents with   Pituitary mass     Rm 3, 5 month FU, "no problem with headaches, no new concerns"   Headache     Today 06/16/22 [name] today for follow-up.   Repeat MRI brain 04/2022 showed stable appearance of pituitary adenoma.  He was seen by ophthalmology 6/12 without any evidence of visual field defect, plans on follow-up next year.  Denies being seen by neurosurgery recently, is scheduled with endo on 11/30, he questions if this appointment is needed. He is currently taking propranolol 60 mg daily which has been helping with his headaches, has not had any recent headaches, tolerating without side effects. He has no questions or concerns today.    ADDENDUM 06/20/2022: Received prior OV note from neurosurgery Dr. JArnoldo Moralefrom visit on 01/20/2022.  Reviewed note and in summary, did not believe pituitary macroadenoma causing any problems and ultimately decided on observation, recommended follow-up in a couple months or sooner if needed.    MRI of the brain 04/06/22 IMPRESSION: This MRI  of the brain with and without contrast with added attention to the pituitary gland shows the following: 1.  15 x 18 x 14 enhancing enlarged pituitary gland consistent with macroadenoma.  It is unchanged compared to the 12/28/2021 MRI.  It approaches the optic chiasm without distorting or compressing it. 2.  Moderate generalized cortical atrophy. 3.   Extensive T2/FLAIR hyperintense foci in the hemispheres consistent with moderately severe chronic microvascular ischemic changes.  This is stable compared to the 12/27/2021 MRI. 4.   No acute findings.  Normal enhancement pattern.     HISTORY Follow-up Feb 02, 2022: Since patient has been seen, he has been admitted into the hospital, repeat MRI of the brain showed a stable pituitary adenoma, he is also been seen by endocrinology and neurosurgery. neurosurgery. Still pending ophthalmology. He saw endocrinology and the tumor would not be responsive to any medication so if it grows will need to be removal. His headache is better. He is seeing ophthalmology June 12th, Seeing Dr. SArlyce Harmaneye doctor, they will send me a note. He does not feel his vision is worsening. Hasn;t had a headache since leaving the hospital. Repeat MRI brain was stable. Would repeat in 3-4 months unless having symptoms. So far no new symptoms. We will call to repeat it in 3 . Seeing NSY in August will repeat in prior to neurosurgery 4th. We will repeat MRI early August. Will call the early July to remind him that repeat it and schedule it late July in preparation for  neurosurgery appointment. He feels good right now   Patient complains of symptoms per HPI as well as the following symptoms: diverticulitis . Pertinent negatives and positives per HPI. All others negative   Repeat MRI brain/orbits 01/14/2022: MRI ORBITS FINDINGS   Orbits: Very limited by motion. No detected optic nerve edema. Unremarkable appearance of the globes, orbital fat, extraocular muscles, and lacrimal glands.    Visualized sinuses: Mild mucosal thickening in ethmoid sinuses.   Soft tissues: Negative   IMPRESSION: 1. Significantly motion degraded MRI of the brain and orbits. 2. Unchanged appearance of pituitary macro adenoma when compared to the MRI last month. No optic nerve mass effect or signs of apoplexy. 3. No acute finding in the brain or orbits. 4. Chronic small vessel ischemia.   Personally reviewed images additional 5 minutes (in addition to appointment time otp today)     HPI:  [name] is a 76 y.o. male here as requested by Deon Pilling, NP for follow-up after being seen in the emergency room.  He has a past medical history of hypertension, hyperlipidemia, CKD stage III, anxiety depression and GERD.  He presented recently after being found to be acutely altered, having constant headache for over a month but acutely worse over the several last week, pain behind the eyes but usually worse on the right, sensitivity to light and sound, home pain medication such as Tylenol Benadryl without relief, associated symptoms dizziness nausea and reportedly an episode of vomiting.  No other significant symptoms such as fever shortness of breath cough chest pain or diarrhea.  He went to see his eye doctor who sent him to the emergency room as he was disoriented and unable to write his name, EMS was called.  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  Neurology was consulted and given a migraine cocktail which abated his headache.  Ophthalmology was consulted and he was found to have a newly diagnosed pituitary mass with bitemporal field cut mild disc edema.  His ophthalmology exam showed no emergent or otherwise medical issues or ocular issues to explain his pain.  Ophthalmology recommended formal visual field testing  on outpatient basis and establishment with neurosurgery   Patient reports headaches ongoing for the past 2 to 3 months in the setting of pituitary macroadenoma, present in the morning lasting all day worsening around noon, no worsening headaches with increased intracranial pressure such as bowel movements, changing positions waking up in the middle the night coughing or sneezing, they are located frontally and periorbitally with right worse than left, nausea with vomiting, associated photophobia and phonophobia, transient blurriness and dizziness but no diplopia. Today it is throbbng around the eyes, nausea, vomiting, blurry vision, no drainage from nipples, migraine cocktail stopped it, came back the next day. He takes 2 aspirin and 2 pain pills this morning. Son is here and provides information. Pulsating/pounding/light sensitivity. Blurry vision. Pain is severe. Better when laying down.      Reviewed notes, labs and imaging from outside physicians, which showed: MRI HEAD FINDINGS   Brain: No acute infarction, hemorrhage, hydrocephalus, or extra-axial fluid collection. Approximately 1.3 cm pituitary mass with suprasellar extension. This mass comes in close proximity to the optic chiasm. Moderate patchy T2/FLAIR hyperintensities in the white matter, nonspecific but compatible with chronic microvascular ischemic disease. Cerebral atrophy.  Vascular: Major arterial flow voids are maintained skull base.   Skull and upper cervical spine: Normal marrow signal.   Other: No mastoid effusions.   MRI ORBITS FINDINGS   Orbits: No orbital mass or evidence of inflammation. Normal appearance of the globes, optic nerve-sheath complexes, extraocular muscles, and lacrimal glands. Prominent retro bulbar fat bilaterally.   Visualized sinuses: Mild-to-moderate paranasal sinus mucosal thickening.   Soft tissues: Normal.   IMPRESSION: Due to patient pain, postcontrast imaging was not performed  and study was terminated early. Within this limitation:   1. Approximately 1.3 cm pituitary mass with suprasellar extension. This mass comes in close proximity to the optic chiasm. Recommend dedicated pituitary protocol MRI with contrast for further characterization. 2. Otherwise, no evidence of acute intracranial or orbital abnormality. 3. 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   Review of records, medications tried that can be used in migraine/headache management include: Tylenol, Decadron, Fioricet, Lexapro, losartan, meloxicam magnesium, Zofran, prednisone, Compazine, nortriptyline/amitriptyline contraindicated due to age and risk of altered mental status due to tricyclic antidepressants, compazine, phenergan, trazodone     REVIEW OF SYSTEMS: Out of a complete 14 system review of symptoms, the patient complains only of the following symptoms, and all other reviewed systems are negative.  See HPI  ALLERGIES: No Known Allergies  HOME MEDICATIONS: Outpatient Medications Prior to Visit  Medication Sig Dispense Refill   acetaminophen (TYLENOL) 500 MG tablet Take 1 tablet (500 mg total) by mouth every 6 (six) hours as needed for mild pain or headache. 30 tablet 0   allopurinol (ZYLOPRIM) 300 MG tablet Take 300 mg by mouth daily.     escitalopram (LEXAPRO) 20 MG tablet Take 20 mg by mouth at bedtime.      Etanercept (ENBREL [place]) Inject 1 Dose into the skin every Monday.     folic acid (FOLVITE) 1 MG tablet Take 1 mg by mouth daily.     loperamide (IMODIUM) 2 MG capsule Take 1 capsule (2 mg total) by mouth every 6 (six) hours as needed for diarrhea or loose stools. 20 capsule 0   losartan (COZAAR) 25 MG tablet Take 25 mg by mouth daily.     Omega-3 Fatty Acids (FISH OIL) 1200 MG CAPS Take 6,000 mg by mouth daily. Takes 5 capsules every morning     ondansetron (ZOFRAN-ODT) 8 MG disintegrating tablet Take 1 tablet (8 mg total) by mouth every 8 (eight) hours as needed  for nausea or vomiting. 30 tablet 6   oxyCODONE-acetaminophen (PERCOCET) 10-325 MG tablet Take 1 tablet by mouth every 6 (six) hours as needed for pain. 60 tablet 0   pantoprazole (PROTONIX) 40 MG tablet Take 40 mg by mouth daily.     pravastatin (PRAVACHOL) 40 MG tablet Take 40 mg by mouth daily.     predniSONE (DELTASONE) 5 MG tablet Take 5 mg by mouth daily with breakfast.     propranolol ER (INDERAL LA) 60 MG 24 hr capsule Take 1 capsule (60 mg total) by mouth daily. Please call and schedule appointment for March 2024 or sooner if symptoms change. 90 capsule 1   traMADol (ULTRAM) 50 MG tablet Take 50 mg by mouth 2 (two) times daily as needed.     traZODone (DESYREL) 50 MG tablet Take 50 mg by mouth at bedtime as needed for sleep.     No facility-administered medications prior to visit.    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 [name]  lens implants   [place]   with titanium rods   UMBILICAL HERNIA REPAIR N/A 06/06/2017   Procedure: UMBILICAL HERNIA REPAIR;  Sparks: [name], MD;  Location: [facility];  Service: General;  Laterality: N/A;    FAMILY HISTORY: Family History  Problem Relation Age of Onset   Cancer Mother    CVA Mother    Cancer Maternal  Grandmother    Healthy Son    Colon cancer Neg Hx     SOCIAL HISTORY: Social History   Socioeconomic History   Marital status: Married    Spouse name: Not on file   Number of children: 1   Years of education: Not on file   Highest education level: Not on file  Occupational History   Not on file  Tobacco Use   Smoking status: Former    Packs/day: 2.00    Years: 20.00    Total pack years: 40.00    Types: Cigarettes    Quit date: 1980    Years since quitting: 43.8   Smokeless tobacco: Never   Tobacco comments:    Stopped early 69s  Vaping Use   Vaping Use: Never used  Substance and Sexual Activity   Alcohol use: No   Drug use: No   Sexual activity: Yes  Other Topics Concern   Not on file  Social History Narrative   Lives at home with spouse   Right handed   Caffeine: 2 cups/day, tea seldom   Social Determinants of Radio broadcast assistant Strain: Not on file  Food Insecurity: Not on file  Transportation Needs: Not on file  Physical Activity: Not on file  Stress: Not on file  Social Connections: Not on file  Intimate Partner Violence: Not on file    PHYSICAL EXAM  Vitals:   06/16/22 1038  BP: (!) 154/81  Pulse: 69  Weight: 158 lb (71.7 kg)  Height: '5\' 7"'$  (1.702 m)   Body mass index is 24.75 kg/m.  Generalized: Well developed, very pleasant elderly Caucasian male, in no acute distress  Neurological examination  Mentation: Alert oriented to time, place, history taking. Follows all commands speech and language fluent Cranial nerve II-XII: Pupils were equal round reactive to light. Extraocular movements were full, visual field were full on confrontational test. Facial sensation and strength were normal. Uvula tongue midline. Head turning and shoulder shrug  were normal and symmetric. Motor: The motor testing reveals 5 over 5 strength of all 4 extremities. Good symmetric motor tone is noted throughout.  Sensory: Sensory testing is intact to soft touch  on all 4 extremities. No evidence of extinction is noted.  Coordination: Cerebellar testing reveals good finger-nose-finger and heel-to-shin bilaterally.  Gait and station: Gait is  normal. Tandem gait is normal. Romberg is negative. No drift is seen.  Reflexes: Deep tendon reflexes are symmetric and normal bilaterally.    DIAGNOSTIC DATA (LABS, IMAGING, TESTING) - I reviewed patient records, labs, notes, testing and imaging myself where available.  Lab Results  Component Value Date   WBC 10.0 01/12/2022   HGB 13.0 01/12/2022   HCT 38.8 (L) 01/12/2022   MCV 89.4 01/12/2022   PLT 276 01/12/2022      Component Value Date/Time   NA 138 01/14/2022 0606   K 3.9 01/14/2022 0606   CL 106 01/14/2022 0606   CO2 26 01/14/2022 0606   GLUCOSE 98 01/14/2022 0606   BUN 37 (H) 01/14/2022 0606   CREATININE 2.03 (H) 01/14/2022 0606   CALCIUM 7.9 (L) 01/14/2022 0606   PROT 6.8 01/09/2022 1019   ALBUMIN 3.0 (L) 01/09/2022 1019   AST 38 01/09/2022 1019   ALT 20 01/09/2022 1019   ALKPHOS 70 01/09/2022 1019   BILITOT 1.1 01/09/2022 1019   GFRNONAA 33 (L) 01/14/2022 0606   GFRAA 42 (L) 10/31/2017 1147   No results found for: "CHOL", "HDL", "LDLCALC", "LDLDIRECT", "TRIG", "CHOLHDL" No results found for: "HGBA1C" Lab Results  Component Value Date   VITAMINB12 754 01/11/2022   Lab Results  Component Value Date   TSH 1.770 01/03/2022      I spent 23 minutes of face-to-face and non-face-to-face time with patient.  This included previsit chart review, lab review, study review, order entry, electronic health record documentation, patient education and discussion regarding above diagnoses and treatment plan and answered all the questions to patient satisfaction  Frann Rider, [facility]  [facility] [address] [place], [place] [address]  Phone [phone] Fax [phone] Note: This document was prepared with digital dictation and possible smart phrase  technology. Any transcriptional errors that result from this process are unintentional.

## 2022-06-15 NOTE — Telephone Encounter (Signed)
Ok and I placed a reminder in the instructions of his propranolol refill to call and schedule an appointment. Also sent mychart message.

## 2022-06-16 ENCOUNTER — Ambulatory Visit: Payer: Medicare Other | Admitting: Adult Health

## 2022-06-16 ENCOUNTER — Encounter: Payer: Self-pay | Admitting: Adult Health

## 2022-06-16 VITALS — BP 154/81 | HR 69 | Ht 67.0 in | Wt 158.0 lb

## 2022-06-16 DIAGNOSIS — G441 Vascular headache, not elsewhere classified: Secondary | ICD-10-CM | POA: Diagnosis not present

## 2022-06-16 DIAGNOSIS — E236 Other disorders of pituitary gland: Secondary | ICD-10-CM

## 2022-06-16 DIAGNOSIS — D352 Benign neoplasm of pituitary gland: Secondary | ICD-10-CM | POA: Diagnosis not present

## 2022-06-16 MED ORDER — PROPRANOLOL HCL ER 60 MG PO CP24: 60.0000 mg | ORAL_CAPSULE | Freq: Every day | ORAL | 3 refills | Status: DC

## 2022-06-16 NOTE — Progress Notes (Addendum)
[place] [facility], [phone], opt 7,3  spoke with Margaretha Sheffield who stated patient last saw Dr Arnoldo Morale 01/18/22. She will fax note for NP's review. Notes received, placed on NP's desk.

## 2022-06-21 ENCOUNTER — Encounter: Payer: Self-pay | Admitting: *Deleted

## 2022-06-24 ENCOUNTER — Encounter: Payer: Self-pay | Admitting: Internal Medicine

## 2022-07-13 DIAGNOSIS — M47816 Spondylosis without myelopathy or radiculopathy, lumbar region: Secondary | ICD-10-CM | POA: Diagnosis not present

## 2022-07-20 ENCOUNTER — Ambulatory Visit: Payer: Medicare Other | Admitting: Adult Health

## 2022-08-04 ENCOUNTER — Ambulatory Visit: Payer: Medicare Other | Admitting: "Endocrinology

## 2022-08-11 DIAGNOSIS — D352 Benign neoplasm of pituitary gland: Secondary | ICD-10-CM | POA: Diagnosis not present

## 2022-08-20 IMAGING — MR MR HEAD WO/W CM
5 of 16 series · 14 of 48 positions shown · IV contrast (Contrast agent)
Comparison: Brain/orbital MRI 1 day

CLINICAL DATA: Follow-up pituitary mass seen on brain MRI 1 day
prior

EXAM:
MRI HEAD WITHOUT AND WITH CONTRAST
TECHNIQUE: Multiplanar, multiecho pulse sequences of the brain and surrounding
structures were obtained without and with intravenous contrast.
CONTRAST:  5mL GADAVIST GADOBUTROL 1 MMOL/ML IV SOLN

[Series 6: T1 · coronal · 3.0mm · 0.25mm/px · 3 of 13 slices shown (1 of 2)]
[im 1/13]
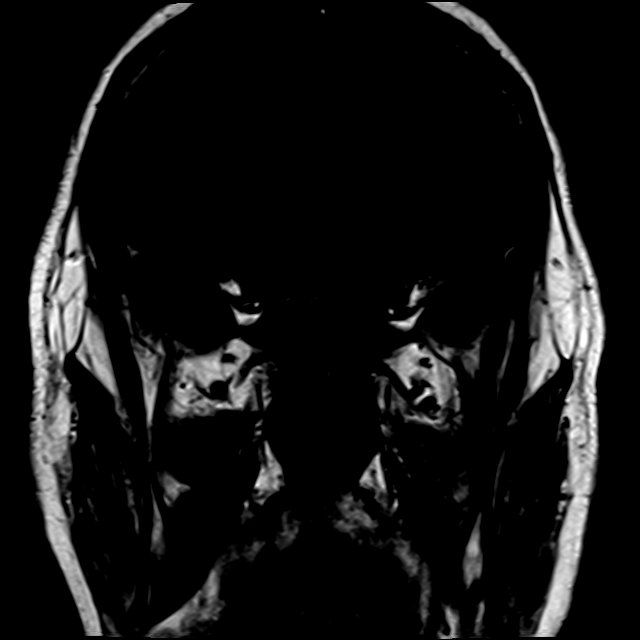
[im 7/13]
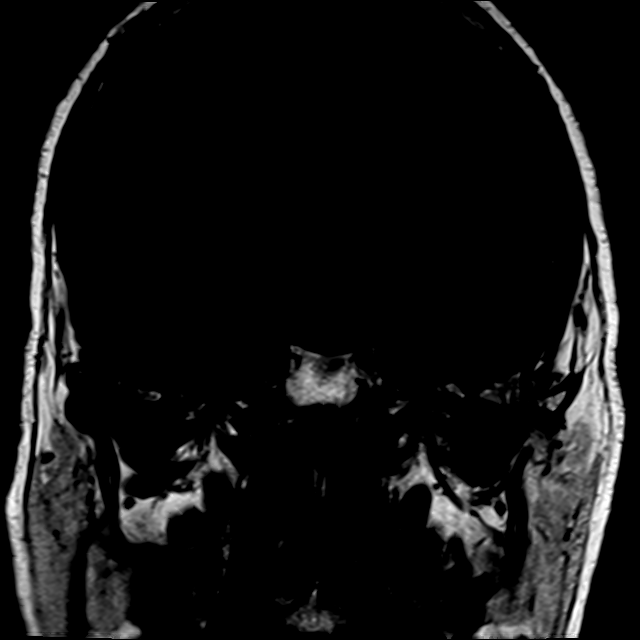
[im 13/13]
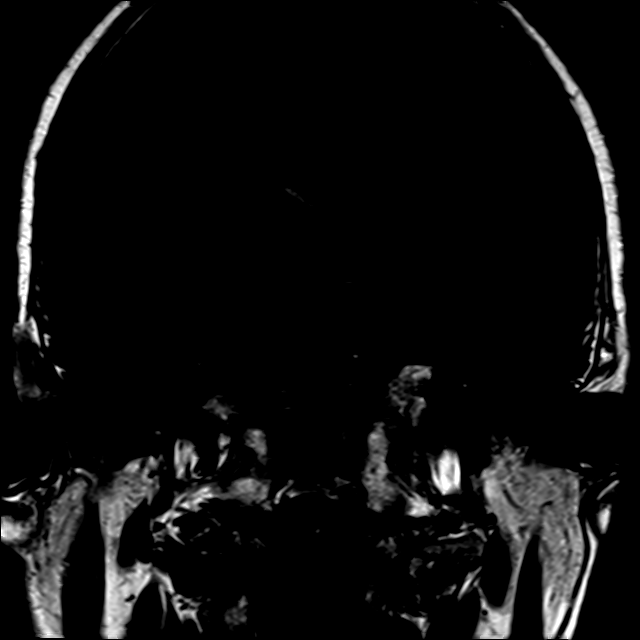

[Series 8: T1 · sagittal · 3.0mm · 0.25mm/px · 1 of 13 slices shown (2 of 2)]
[im 1/13]
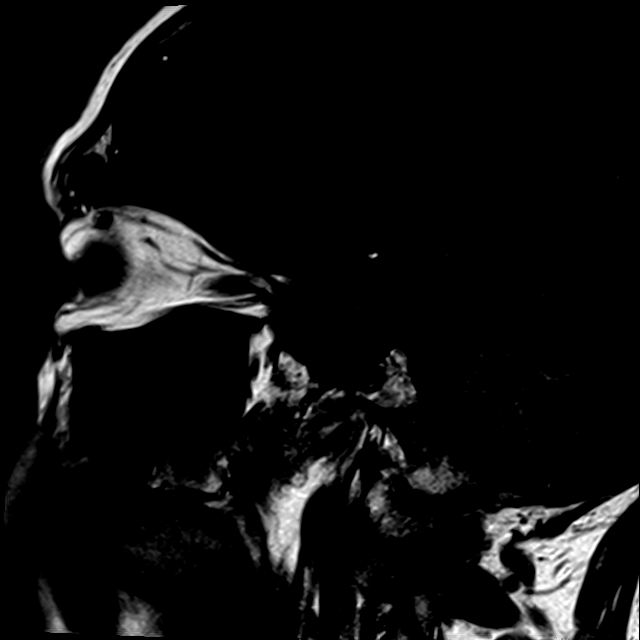

[Series 18: T1 post-contrast · sagittal · 3.0mm · 0.25mm/px · 2 of 13 slices shown (1 of 3)]
[im 1/13]
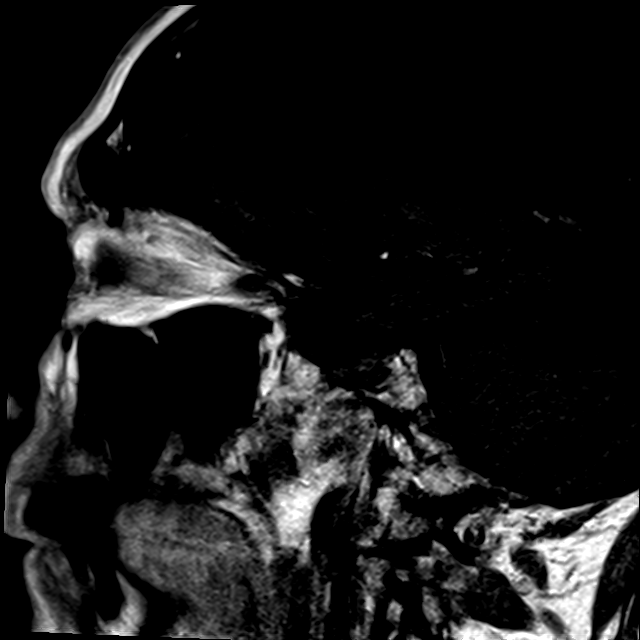
[im 13/13]
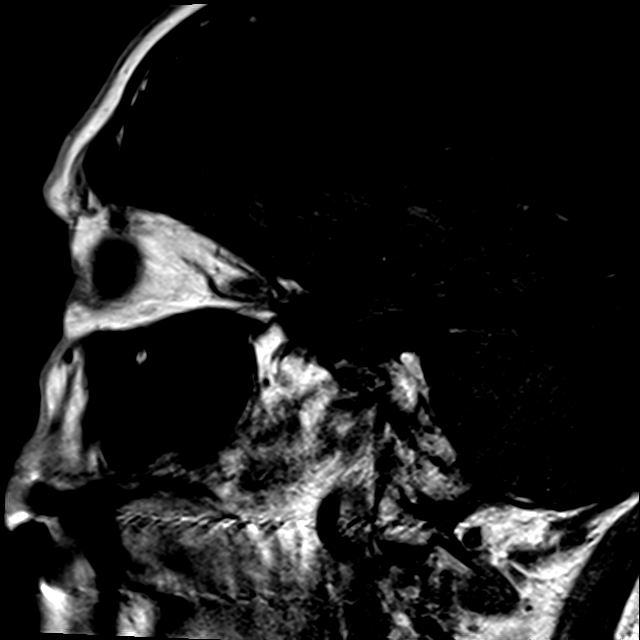

[Series 19: T1 post-contrast · coronal · 3.0mm · 0.25mm/px · 2 of 13 slices shown (2 of 3)]
[im 1/13]
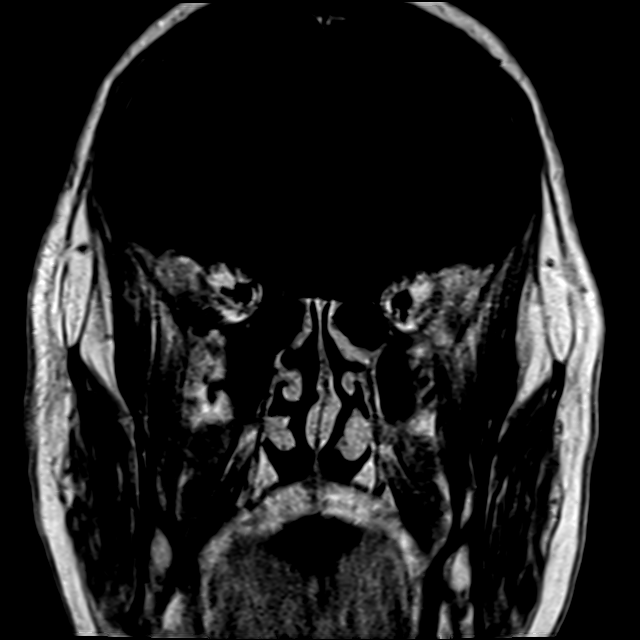
[im 13/13]
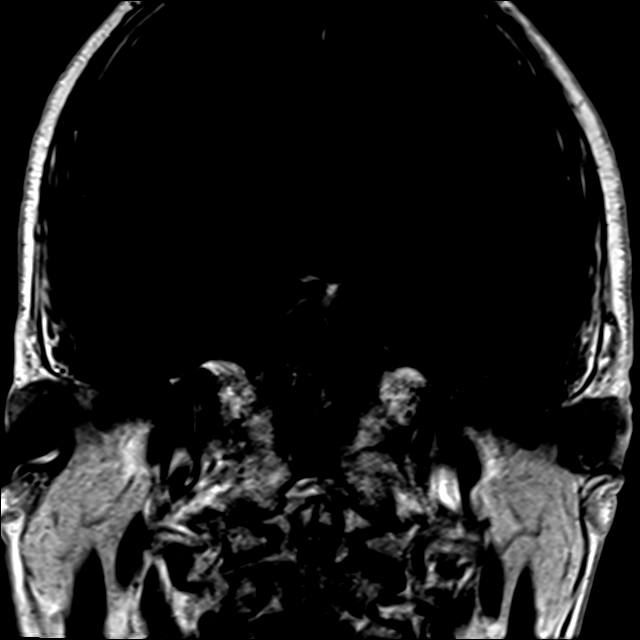

[Series 21: T1 post-contrast · coronal · 5.0mm · 0.34mm/px · 6 of 31 slices shown (3 of 3)]
[im 1/31]
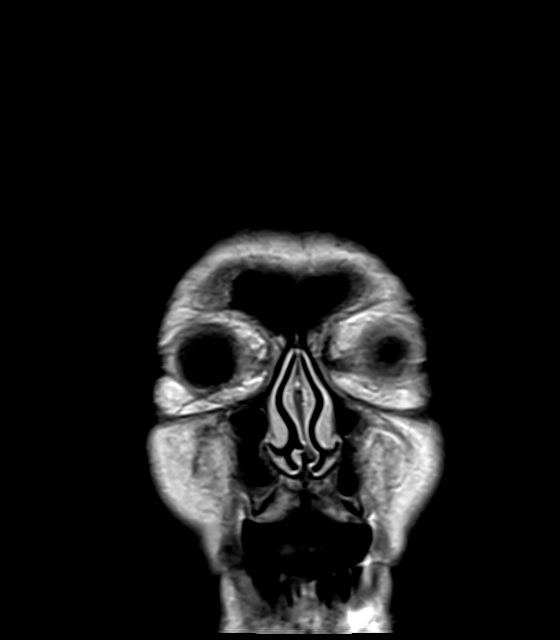
[im 7/31]
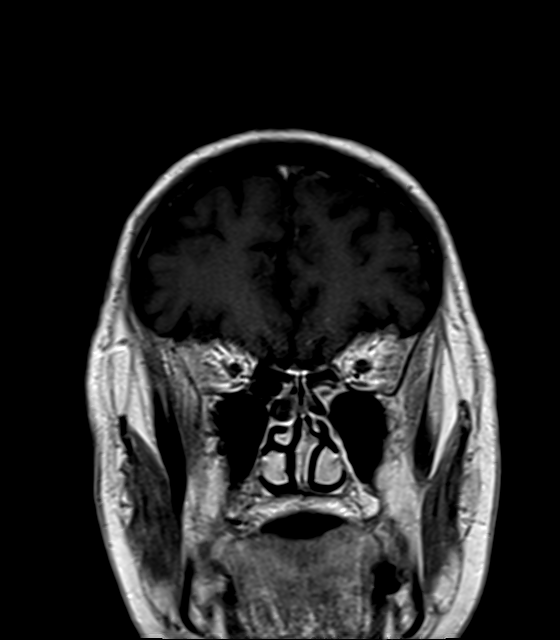
[im 13/31]
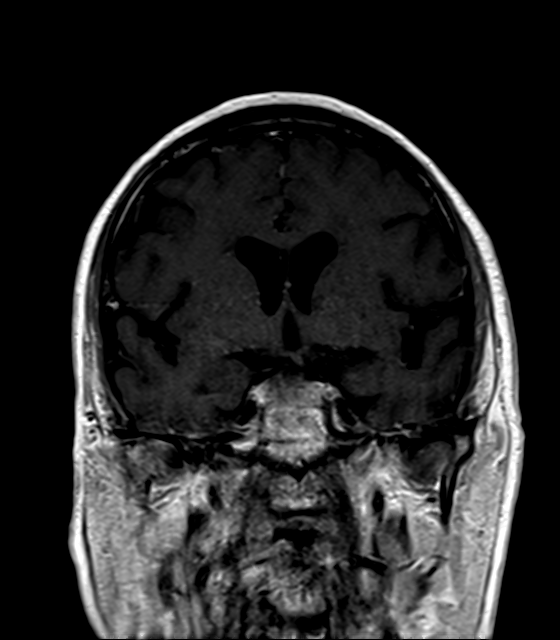
[im 19/31]
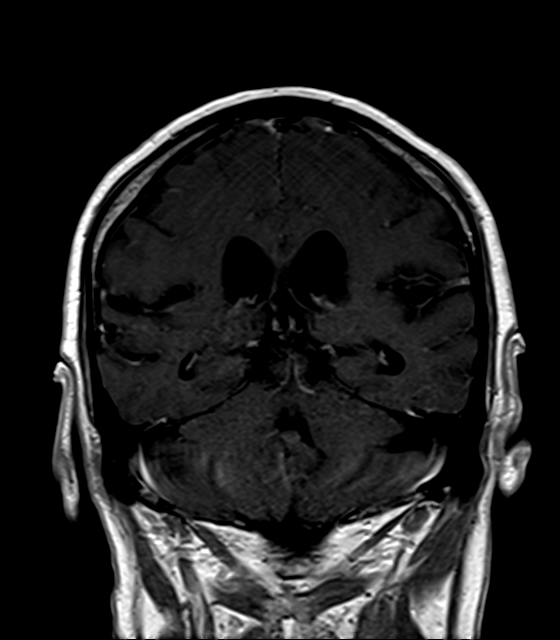
[im 25/31]
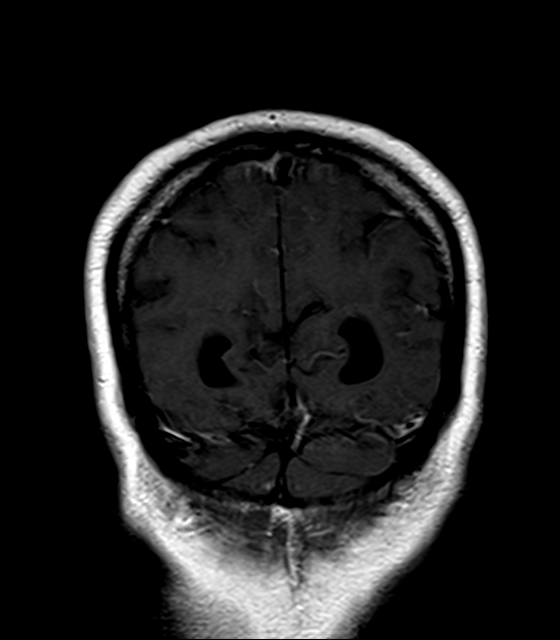
[im 31/31]
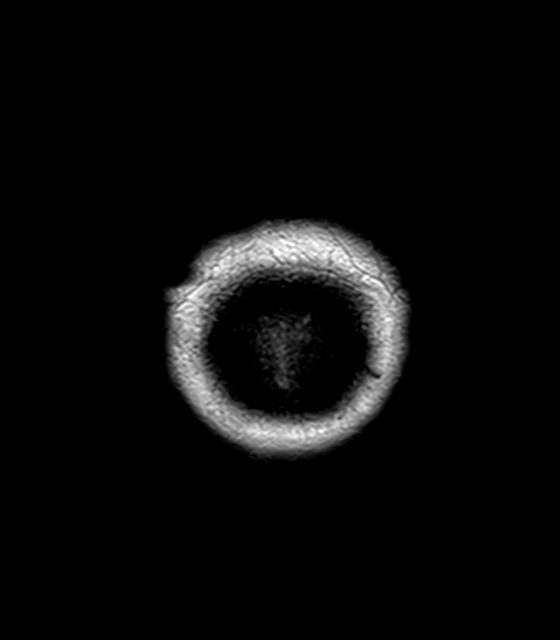

[14 of 48 positions shown; findings below may reference images not displayed]

FINDINGS: Brain: Standard brain MRI protocol was not obtained as the full
noncontrast brain MRI was obtained 1 day prior. Postcontrast images
of the brain demonstrate no abnormal parenchymal enhancement.

There is a 1.5 cm AP by 2.0 cm TV by 1.3 cm cc enhancing lesion
centered in the sella with suprasellar extent likely reflecting a
macro adenoma. The lesion comes in close proximity to the
undersurface of the optic chiasm without evidence of displacement.
On the right, the lesion extends to the level of the lateral margin
of the ICA, and on the left the lesion extends towards the level of
the intercarotid line (Knosp 2 on the right and 1 on the left,
15-3). There is leftward deviation of the pituitary stalk.

Vascular: The major intracranial flow voids are present. Cavernous
ICA flow voids are normal.

Skull and upper cervical spine: Normal marrow signal.

Sinuses/Orbits: There is mild mucosal thickening in the paranasal
sinuses. Bilateral lens implants are in place. The globes and orbits
are otherwise unremarkable.

Other: None.
IMPRESSION: Enhancing lesion in the sella measuring up to 2.0 cm consistent with
a macroadenoma. There is suprasellar extension without evidence of
mass effect on the optic chiasm.

## 2022-08-22 ENCOUNTER — Encounter: Payer: Self-pay | Admitting: Neurology

## 2022-08-22 DIAGNOSIS — R7303 Prediabetes: Secondary | ICD-10-CM | POA: Diagnosis not present

## 2022-08-22 DIAGNOSIS — R519 Headache, unspecified: Secondary | ICD-10-CM | POA: Diagnosis not present

## 2022-08-22 DIAGNOSIS — I10 Essential (primary) hypertension: Secondary | ICD-10-CM | POA: Diagnosis not present

## 2022-08-22 DIAGNOSIS — E7800 Pure hypercholesterolemia, unspecified: Secondary | ICD-10-CM | POA: Diagnosis not present

## 2022-08-22 DIAGNOSIS — D352 Benign neoplasm of pituitary gland: Secondary | ICD-10-CM | POA: Diagnosis not present

## 2022-08-22 NOTE — Telephone Encounter (Signed)
PCP office also sent over a referral for this patient for this same thing-please advise when he can be scheduled I don't see anything open anytime soon

## 2022-08-23 ENCOUNTER — Other Ambulatory Visit: Payer: Self-pay | Admitting: Neurosurgery

## 2022-08-26 ENCOUNTER — Inpatient Hospital Stay (HOSPITAL_COMMUNITY): Payer: Medicare Other

## 2022-08-26 ENCOUNTER — Encounter: Payer: Self-pay | Admitting: Neurology

## 2022-08-26 ENCOUNTER — Emergency Department (HOSPITAL_COMMUNITY): Payer: Medicare Other

## 2022-08-26 ENCOUNTER — Other Ambulatory Visit: Payer: Self-pay

## 2022-08-26 ENCOUNTER — Encounter (HOSPITAL_COMMUNITY): Payer: Self-pay | Admitting: Neurology

## 2022-08-26 ENCOUNTER — Telehealth: Payer: Self-pay | Admitting: Adult Health

## 2022-08-26 ENCOUNTER — Inpatient Hospital Stay (HOSPITAL_COMMUNITY)
Admission: EM | Admit: 2022-08-26 | Discharge: 2022-09-01 | DRG: 071 | Disposition: A | Discharge status: Home Health | Payer: Medicare Other | Attending: Neurology | Admitting: Neurology

## 2022-08-26 ENCOUNTER — Ambulatory Visit (INDEPENDENT_AMBULATORY_CARE_PROVIDER_SITE_OTHER): Payer: Self-pay | Admitting: Neurology

## 2022-08-26 DIAGNOSIS — Z743 Need for continuous supervision: Secondary | ICD-10-CM | POA: Diagnosis not present

## 2022-08-26 DIAGNOSIS — D849 Immunodeficiency, unspecified: Secondary | ICD-10-CM | POA: Diagnosis not present

## 2022-08-26 DIAGNOSIS — N179 Acute kidney failure, unspecified: Secondary | ICD-10-CM | POA: Diagnosis not present

## 2022-08-26 DIAGNOSIS — I700 Atherosclerosis of aorta: Secondary | ICD-10-CM | POA: Diagnosis not present

## 2022-08-26 DIAGNOSIS — R0682 Tachypnea, not elsewhere classified: Secondary | ICD-10-CM | POA: Diagnosis not present

## 2022-08-26 DIAGNOSIS — G9349 Other encephalopathy: Secondary | ICD-10-CM | POA: Diagnosis not present

## 2022-08-26 DIAGNOSIS — I4892 Unspecified atrial flutter: Secondary | ICD-10-CM | POA: Diagnosis not present

## 2022-08-26 DIAGNOSIS — Z91199 Patient's noncompliance with other medical treatment and regimen due to unspecified reason: Secondary | ICD-10-CM

## 2022-08-26 DIAGNOSIS — R4182 Altered mental status, unspecified: Secondary | ICD-10-CM | POA: Diagnosis not present

## 2022-08-26 DIAGNOSIS — Z809 Family history of malignant neoplasm, unspecified: Secondary | ICD-10-CM

## 2022-08-26 DIAGNOSIS — R109 Unspecified abdominal pain: Secondary | ICD-10-CM | POA: Diagnosis not present

## 2022-08-26 DIAGNOSIS — I4891 Unspecified atrial fibrillation: Secondary | ICD-10-CM | POA: Diagnosis not present

## 2022-08-26 DIAGNOSIS — I2510 Atherosclerotic heart disease of native coronary artery without angina pectoris: Secondary | ICD-10-CM | POA: Diagnosis not present

## 2022-08-26 DIAGNOSIS — Z9049 Acquired absence of other specified parts of digestive tract: Secondary | ICD-10-CM

## 2022-08-26 DIAGNOSIS — Z8711 Personal history of peptic ulcer disease: Secondary | ICD-10-CM

## 2022-08-26 DIAGNOSIS — B379 Candidiasis, unspecified: Secondary | ICD-10-CM | POA: Diagnosis not present

## 2022-08-26 DIAGNOSIS — Z1152 Encounter for screening for COVID-19: Secondary | ICD-10-CM | POA: Diagnosis not present

## 2022-08-26 DIAGNOSIS — D72829 Elevated white blood cell count, unspecified: Secondary | ICD-10-CM | POA: Diagnosis not present

## 2022-08-26 DIAGNOSIS — R2990 Unspecified symptoms and signs involving the nervous system: Principal | ICD-10-CM

## 2022-08-26 DIAGNOSIS — K219 Gastro-esophageal reflux disease without esophagitis: Secondary | ICD-10-CM | POA: Diagnosis not present

## 2022-08-26 DIAGNOSIS — N281 Cyst of kidney, acquired: Secondary | ICD-10-CM | POA: Diagnosis present

## 2022-08-26 DIAGNOSIS — R21 Rash and other nonspecific skin eruption: Secondary | ICD-10-CM | POA: Diagnosis present

## 2022-08-26 DIAGNOSIS — F419 Anxiety disorder, unspecified: Secondary | ICD-10-CM | POA: Diagnosis present

## 2022-08-26 DIAGNOSIS — E785 Hyperlipidemia, unspecified: Secondary | ICD-10-CM | POA: Diagnosis present

## 2022-08-26 DIAGNOSIS — I129 Hypertensive chronic kidney disease with stage 1 through stage 4 chronic kidney disease, or unspecified chronic kidney disease: Secondary | ICD-10-CM | POA: Diagnosis present

## 2022-08-26 DIAGNOSIS — M069 Rheumatoid arthritis, unspecified: Secondary | ICD-10-CM | POA: Diagnosis not present

## 2022-08-26 DIAGNOSIS — R7303 Prediabetes: Secondary | ICD-10-CM | POA: Diagnosis present

## 2022-08-26 DIAGNOSIS — D352 Benign neoplasm of pituitary gland: Secondary | ICD-10-CM | POA: Diagnosis not present

## 2022-08-26 DIAGNOSIS — R471 Dysarthria and anarthria: Secondary | ICD-10-CM | POA: Diagnosis present

## 2022-08-26 DIAGNOSIS — R519 Headache, unspecified: Secondary | ICD-10-CM | POA: Diagnosis not present

## 2022-08-26 DIAGNOSIS — R000 Tachycardia, unspecified: Secondary | ICD-10-CM | POA: Diagnosis present

## 2022-08-26 DIAGNOSIS — M109 Gout, unspecified: Secondary | ICD-10-CM | POA: Diagnosis present

## 2022-08-26 DIAGNOSIS — R4701 Aphasia: Secondary | ICD-10-CM | POA: Diagnosis present

## 2022-08-26 DIAGNOSIS — G9341 Metabolic encephalopathy: Secondary | ICD-10-CM | POA: Diagnosis not present

## 2022-08-26 DIAGNOSIS — R401 Stupor: Secondary | ICD-10-CM | POA: Diagnosis not present

## 2022-08-26 DIAGNOSIS — G9340 Encephalopathy, unspecified: Secondary | ICD-10-CM

## 2022-08-26 DIAGNOSIS — I639 Cerebral infarction, unspecified: Secondary | ICD-10-CM | POA: Diagnosis not present

## 2022-08-26 DIAGNOSIS — F05 Delirium due to known physiological condition: Secondary | ICD-10-CM | POA: Diagnosis present

## 2022-08-26 DIAGNOSIS — Z87891 Personal history of nicotine dependence: Secondary | ICD-10-CM

## 2022-08-26 DIAGNOSIS — Z66 Do not resuscitate: Secondary | ICD-10-CM | POA: Diagnosis not present

## 2022-08-26 DIAGNOSIS — G8929 Other chronic pain: Secondary | ICD-10-CM | POA: Diagnosis not present

## 2022-08-26 DIAGNOSIS — R0989 Other specified symptoms and signs involving the circulatory and respiratory systems: Secondary | ICD-10-CM | POA: Diagnosis not present

## 2022-08-26 DIAGNOSIS — R410 Disorientation, unspecified: Secondary | ICD-10-CM | POA: Diagnosis not present

## 2022-08-26 DIAGNOSIS — N1832 Chronic kidney disease, stage 3b: Secondary | ICD-10-CM | POA: Diagnosis not present

## 2022-08-26 DIAGNOSIS — G9389 Other specified disorders of brain: Secondary | ICD-10-CM | POA: Diagnosis present

## 2022-08-26 DIAGNOSIS — Z7952 Long term (current) use of systemic steroids: Secondary | ICD-10-CM

## 2022-08-26 DIAGNOSIS — F32A Depression, unspecified: Secondary | ICD-10-CM | POA: Diagnosis present

## 2022-08-26 DIAGNOSIS — G4700 Insomnia, unspecified: Secondary | ICD-10-CM | POA: Diagnosis present

## 2022-08-26 DIAGNOSIS — I6389 Other cerebral infarction: Secondary | ICD-10-CM | POA: Diagnosis not present

## 2022-08-26 DIAGNOSIS — R531 Weakness: Secondary | ICD-10-CM | POA: Diagnosis not present

## 2022-08-26 DIAGNOSIS — I959 Hypotension, unspecified: Secondary | ICD-10-CM | POA: Diagnosis not present

## 2022-08-26 DIAGNOSIS — Z781 Physical restraint status: Secondary | ICD-10-CM

## 2022-08-26 DIAGNOSIS — G4489 Other headache syndrome: Secondary | ICD-10-CM | POA: Diagnosis not present

## 2022-08-26 DIAGNOSIS — R488 Other symbolic dysfunctions: Secondary | ICD-10-CM | POA: Diagnosis present

## 2022-08-26 DIAGNOSIS — Z981 Arthrodesis status: Secondary | ICD-10-CM

## 2022-08-26 DIAGNOSIS — R509 Fever, unspecified: Secondary | ICD-10-CM | POA: Diagnosis not present

## 2022-08-26 DIAGNOSIS — Z823 Family history of stroke: Secondary | ICD-10-CM

## 2022-08-26 DIAGNOSIS — R918 Other nonspecific abnormal finding of lung field: Secondary | ICD-10-CM | POA: Diagnosis not present

## 2022-08-26 DIAGNOSIS — Z961 Presence of intraocular lens: Secondary | ICD-10-CM | POA: Diagnosis present

## 2022-08-26 DIAGNOSIS — R29898 Other symptoms and signs involving the musculoskeletal system: Secondary | ICD-10-CM | POA: Diagnosis not present

## 2022-08-26 DIAGNOSIS — R1310 Dysphagia, unspecified: Secondary | ICD-10-CM | POA: Diagnosis present

## 2022-08-26 DIAGNOSIS — R29818 Other symptoms and signs involving the nervous system: Secondary | ICD-10-CM | POA: Diagnosis not present

## 2022-08-26 DIAGNOSIS — Z79899 Other long term (current) drug therapy: Secondary | ICD-10-CM

## 2022-08-26 LAB — DIFFERENTIAL
Abs Immature Granulocytes: 0.08 10*3/uL — ABNORMAL HIGH (ref 0.00–0.07)
Basophils Absolute: 0.1 10*3/uL (ref 0.0–0.1)
Basophils Relative: 0 %
Eosinophils Absolute: 0.2 10*3/uL (ref 0.0–0.5)
Eosinophils Relative: 2 %
Immature Granulocytes: 1 %
Lymphocytes Relative: 26 %
Lymphs Abs: 3.3 10*3/uL (ref 0.7–4.0)
Monocytes Absolute: 0.8 10*3/uL (ref 0.1–1.0)
Monocytes Relative: 6 %
Neutro Abs: 8.4 10*3/uL — ABNORMAL HIGH (ref 1.7–7.7)
Neutrophils Relative %: 65 %

## 2022-08-26 LAB — COMPREHENSIVE METABOLIC PANEL
ALT: 19 U/L (ref 0–44)
AST: 29 U/L (ref 15–41)
Albumin: 3.2 g/dL — ABNORMAL LOW (ref 3.5–5.0)
Alkaline Phosphatase: 57 U/L (ref 38–126)
Anion gap: 10 (ref 5–15)
BUN: 29 mg/dL — ABNORMAL HIGH (ref 8–23)
CO2: 24 mmol/L (ref 22–32)
Calcium: 8.4 mg/dL — ABNORMAL LOW (ref 8.9–10.3)
Chloride: 104 mmol/L (ref 98–111)
Creatinine, Ser: 1.81 mg/dL — ABNORMAL HIGH (ref 0.61–1.24)
GFR, Estimated: 38 mL/min — ABNORMAL LOW (ref >60)
Glucose, Bld: 143 mg/dL — ABNORMAL HIGH (ref 70–99)
Potassium: 4 mmol/L (ref 3.5–5.1)
Sodium: 138 mmol/L (ref 135–145)
Total Bilirubin: 0.8 mg/dL (ref 0.3–1.2)
Total Protein: 6.3 g/dL — ABNORMAL LOW (ref 6.5–8.1)

## 2022-08-26 LAB — CBC
HCT: 35 % — ABNORMAL LOW (ref 39.0–52.0)
Hemoglobin: 12 g/dL — ABNORMAL LOW (ref 13.0–17.0)
MCH: 31 pg (ref 26.0–34.0)
MCHC: 34.3 g/dL (ref 30.0–36.0)
MCV: 90.4 fL (ref 80.0–100.0)
Platelets: 182 10*3/uL (ref 150–400)
RBC: 3.87 MIL/uL — ABNORMAL LOW (ref 4.22–5.81)
RDW: 14 % (ref 11.5–15.5)
WBC: 12.8 10*3/uL — ABNORMAL HIGH (ref 4.0–10.5)
nRBC: 0 % (ref 0.0–0.2)

## 2022-08-26 LAB — URINALYSIS, ROUTINE W REFLEX MICROSCOPIC
Bilirubin Urine: NEGATIVE
Glucose, UA: NEGATIVE mg/dL
Hgb urine dipstick: NEGATIVE
Ketones, ur: NEGATIVE mg/dL
Leukocytes,Ua: NEGATIVE
Nitrite: NEGATIVE
Protein, ur: NEGATIVE mg/dL
Specific Gravity, Urine: 1.029 (ref 1.005–1.030)
pH: 7 (ref 5.0–8.0)

## 2022-08-26 LAB — RESP PANEL BY RT-PCR (RSV, FLU A&B, COVID)  RVPGX2
Influenza A by PCR: NEGATIVE
Influenza B by PCR: NEGATIVE
Resp Syncytial Virus by PCR: NEGATIVE
SARS Coronavirus 2 by RT PCR: NEGATIVE

## 2022-08-26 LAB — I-STAT CHEM 8, ED
BUN: 37 mg/dL — ABNORMAL HIGH (ref 8–23)
Calcium, Ion: 1.03 mmol/L — ABNORMAL LOW (ref 1.15–1.40)
Chloride: 104 mmol/L (ref 98–111)
Creatinine, Ser: 1.7 mg/dL — ABNORMAL HIGH (ref 0.61–1.24)
Glucose, Bld: 140 mg/dL — ABNORMAL HIGH (ref 70–99)
HCT: 41 % (ref 39.0–52.0)
Hemoglobin: 13.9 g/dL (ref 13.0–17.0)
Potassium: 4.1 mmol/L (ref 3.5–5.1)
Sodium: 140 mmol/L (ref 135–145)
TCO2: 26 mmol/L (ref 22–32)

## 2022-08-26 LAB — RAPID URINE DRUG SCREEN, HOSP PERFORMED
Amphetamines: NOT DETECTED
Barbiturates: NOT DETECTED
Benzodiazepines: NOT DETECTED
Cocaine: NOT DETECTED
Opiates: NOT DETECTED
Tetrahydrocannabinol: NOT DETECTED

## 2022-08-26 LAB — ECHOCARDIOGRAM COMPLETE
AR max vel: 1.31 cm2
AV Area VTI: 1.57 cm2
AV Area mean vel: 1.29 cm2
AV Mean grad: 14 mmHg
AV Peak grad: 26.6 mmHg
Ao pk vel: 2.58 m/s
Area-P 1/2: 4.8 cm2
Height: 68 in
Weight: 2532.64 oz

## 2022-08-26 LAB — APTT: aPTT: 22 seconds — ABNORMAL LOW (ref 24–36)

## 2022-08-26 LAB — MRSA NEXT GEN BY PCR, NASAL: MRSA by PCR Next Gen: NOT DETECTED

## 2022-08-26 LAB — ETHANOL: Alcohol, Ethyl (B): <10 mg/dL (ref <10)

## 2022-08-26 LAB — PROTIME-INR
INR: 1.2 (ref 0.8–1.2)
Prothrombin Time: 15 seconds (ref 11.4–15.2)

## 2022-08-26 LAB — CBG MONITORING, ED: Glucose-Capillary: 165 mg/dL — ABNORMAL HIGH (ref 70–99)

## 2022-08-26 MED ORDER — SODIUM CHLORIDE 0.9 % IV SOLN: INTRAVENOUS | Status: DC

## 2022-08-26 MED ORDER — ESCITALOPRAM OXALATE 10 MG PO TABS
20.0000 mg | ORAL_TABLET | Freq: Every day | ORAL | Status: DC
  Administered 2022-08-27 – 2022-08-31 (×5): 20 mg via ORAL
  Filled 2022-08-26 (×5): qty 2

## 2022-08-26 MED ORDER — ACETAMINOPHEN 325 MG PO TABS
650.0000 mg | ORAL_TABLET | ORAL | Status: DC | PRN
  Administered 2022-08-28 – 2022-09-01 (×4): 650 mg via ORAL
  Filled 2022-08-26 (×4): qty 2

## 2022-08-26 MED ORDER — IOHEXOL 350 MG/ML SOLN
150.0000 mL | Freq: Once | INTRAVENOUS | Status: AC | PRN
  Administered 2022-08-26: 150 mL via INTRAVENOUS

## 2022-08-26 MED ORDER — ORAL CARE MOUTH RINSE
15.0000 mL | OROMUCOSAL | Status: DC
  Administered 2022-08-26 – 2022-08-31 (×18): 15 mL via OROMUCOSAL

## 2022-08-26 MED ORDER — CLEVIDIPINE BUTYRATE 0.5 MG/ML IV EMUL
0.0000 mg/h | INTRAVENOUS | Status: DC
  Administered 2022-08-26: 2 mg/h via INTRAVENOUS
  Filled 2022-08-26: qty 100

## 2022-08-26 MED ORDER — METOPROLOL TARTRATE 5 MG/5ML IV SOLN
5.0000 mg | Freq: Once | INTRAVENOUS | Status: AC
  Administered 2022-08-26: 5 mg via INTRAVENOUS
  Filled 2022-08-26: qty 5

## 2022-08-26 MED ORDER — LORAZEPAM 2 MG/ML IJ SOLN
INTRAMUSCULAR | Status: AC
  Filled 2022-08-26: qty 1

## 2022-08-26 MED ORDER — LORAZEPAM 2 MG/ML IJ SOLN
1.0000 mg | Freq: Once | INTRAMUSCULAR | Status: AC
  Administered 2022-08-27: 1 mg via INTRAVENOUS
  Filled 2022-08-26: qty 1

## 2022-08-26 MED ORDER — TRAZODONE HCL 50 MG PO TABS
50.0000 mg | ORAL_TABLET | Freq: Every evening | ORAL | Status: DC | PRN
  Administered 2022-08-27 – 2022-08-31 (×4): 50 mg via ORAL
  Filled 2022-08-26 (×4): qty 1

## 2022-08-26 MED ORDER — TENECTEPLASE FOR STROKE
0.2500 mg/kg | PACK | Freq: Once | INTRAVENOUS | Status: AC
  Administered 2022-08-26: 18 mg via INTRAVENOUS
  Filled 2022-08-26: qty 10

## 2022-08-26 MED ORDER — FOLIC ACID 1 MG PO TABS
1.0000 mg | ORAL_TABLET | Freq: Every day | ORAL | Status: DC
  Administered 2022-08-27 – 2022-09-01 (×6): 1 mg via ORAL
  Filled 2022-08-26 (×6): qty 1

## 2022-08-26 MED ORDER — LORAZEPAM 2 MG/ML IJ SOLN
INTRAMUSCULAR | Status: AC
  Administered 2022-08-26: 1 mg
  Filled 2022-08-26: qty 1

## 2022-08-26 MED ORDER — IOHEXOL 350 MG/ML SOLN
75.0000 mL | Freq: Once | INTRAVENOUS | Status: AC | PRN
  Administered 2022-08-26: 75 mL via INTRAVENOUS

## 2022-08-26 MED ORDER — PANTOPRAZOLE SODIUM 40 MG IV SOLR
40.0000 mg | Freq: Every day | INTRAVENOUS | Status: DC
  Administered 2022-08-27: 40 mg via INTRAVENOUS
  Filled 2022-08-26: qty 10

## 2022-08-26 MED ORDER — ALLOPURINOL 100 MG PO TABS
300.0000 mg | ORAL_TABLET | Freq: Every day | ORAL | Status: DC
  Administered 2022-08-27 – 2022-09-01 (×6): 300 mg via ORAL
  Filled 2022-08-26: qty 1
  Filled 2022-08-26: qty 3
  Filled 2022-08-26 (×4): qty 1

## 2022-08-26 MED ORDER — ACETAMINOPHEN 650 MG RE SUPP
650.0000 mg | RECTAL | Status: DC | PRN
  Administered 2022-08-27: 650 mg via RECTAL
  Filled 2022-08-26: qty 1

## 2022-08-26 MED ORDER — ORAL CARE MOUTH RINSE: 15.0000 mL | OROMUCOSAL | Status: DC | PRN

## 2022-08-26 MED ORDER — ACETAMINOPHEN 160 MG/5ML PO SOLN: 650.0000 mg | ORAL | Status: DC | PRN

## 2022-08-26 MED ORDER — STROKE: EARLY STAGES OF RECOVERY BOOK
Freq: Once | Status: AC
  Administered 2022-08-27: 1
  Filled 2022-08-26: qty 1

## 2022-08-26 MED ORDER — PREDNISONE 5 MG PO TABS
5.0000 mg | ORAL_TABLET | Freq: Every day | ORAL | Status: DC
  Administered 2022-08-27 – 2022-09-01 (×6): 5 mg via ORAL
  Filled 2022-08-26 (×6): qty 1

## 2022-08-26 MED ORDER — PRAVASTATIN SODIUM 40 MG PO TABS
40.0000 mg | ORAL_TABLET | Freq: Every day | ORAL | Status: DC
  Administered 2022-08-27 – 2022-09-01 (×6): 40 mg via ORAL
  Filled 2022-08-26 (×6): qty 1

## 2022-08-26 MED ORDER — SENNOSIDES-DOCUSATE SODIUM 8.6-50 MG PO TABS: 1.0000 | ORAL_TABLET | Freq: Every evening | ORAL | Status: DC | PRN

## 2022-08-26 MED ORDER — METOPROLOL TARTRATE 5 MG/5ML IV SOLN: 5.0000 mg | Freq: Four times a day (QID) | INTRAVENOUS | Status: DC | PRN

## 2022-08-26 MED ORDER — PANTOPRAZOLE SODIUM 40 MG PO TBEC: 40.0000 mg | DELAYED_RELEASE_TABLET | Freq: Every day | ORAL | Status: DC | PRN

## 2022-08-26 MED ORDER — HALOPERIDOL LACTATE 5 MG/ML IJ SOLN
2.0000 mg | Freq: Once | INTRAMUSCULAR | Status: AC
  Administered 2022-08-26: 2 mg via INTRAVENOUS

## 2022-08-26 MED ORDER — LABETALOL HCL 5 MG/ML IV SOLN
10.0000 mg | Freq: Once | INTRAVENOUS | Status: AC
  Administered 2022-08-26: 10 mg via INTRAVENOUS

## 2022-08-26 MED ORDER — HALOPERIDOL LACTATE 5 MG/ML IJ SOLN
INTRAMUSCULAR | Status: AC
  Filled 2022-08-26: qty 1

## 2022-08-26 MED ORDER — HALOPERIDOL LACTATE 5 MG/ML IJ SOLN
2.0000 mg | Freq: Once | INTRAMUSCULAR | Status: AC
  Administered 2022-08-26: 2 mg via INTRAVENOUS
  Filled 2022-08-26: qty 1

## 2022-08-26 MED ORDER — HALOPERIDOL LACTATE 5 MG/ML IJ SOLN
2.0000 mg | Freq: Four times a day (QID) | INTRAMUSCULAR | Status: DC | PRN
  Administered 2022-08-26 – 2022-08-28 (×3): 2 mg via INTRAVENOUS
  Filled 2022-08-26 (×4): qty 1

## 2022-08-26 MED ORDER — LORAZEPAM 2 MG/ML IJ SOLN
1.0000 mg | INTRAMUSCULAR | Status: AC | PRN
  Administered 2022-08-26 (×2): 1 mg via INTRAVENOUS
  Filled 2022-08-26: qty 1

## 2022-08-26 NOTE — ED Notes (Signed)
Echo at bedside

## 2022-08-26 NOTE — Code Documentation (Signed)
Pt is a 76 yr old male presenting to [facility] on 08/26/2022 with a PMH of colon polyps, diverticulosis, RA, CKD, and recent headaches.  . He is not on any blood thinners. Pt is from home where he was last known well when he went to the bathroom at0825. When he didn't come out a few minutes later, his wife found him slumped over, shaking and unable to speak. EMS dispatched, Code stroke activated.    Pt met by stroke team at the bridge. CBG, labs drawn, airway cleared. Pt to CT scanner with team. Pt with aphasia, unable to follow commands, and left gaze. [facility], please see documentation for details and timeline.) Per Dr Lorrin Goodell, CT negative for acute hemorrhage. No contraindications for TNK found. Risk/benefits discussed with wife. Consent obtained, TNK given at 1019.    CTA obtained next. Pt required 2 mg ativan to be still for CTA. Per Dr. Lorrin Goodell, CTA negative for LVO.  Pt returned to room 21 where his workup will continue. Pt with severe agitation requiring additional 2 mg ativan and 4 mg Haldol. Pt will need q 15 min VS and NIHSS for 2 hr, then q 30 min for 6 hr then hourly for 16 hrs. Bedside handoff with Elexa RN.

## 2022-08-26 NOTE — Progress Notes (Signed)
Severe agitation being treated with ativan and haldol.

## 2022-08-26 NOTE — ED Provider Notes (Signed)
[facility] EMERGENCY DEPARTMENT Provider Note   CSN: [id number] Arrival date & time: 08/26/22  1000     History {Add pertinent medical, surgical, social history, OB history to HPI:1} No chief complaint on file.   [name] is a 76 y.o. male with HTN, HLD, GERD, RA, CKD stage III, pituitary macroadenoma, gout, right posterior vitreous detachment, gastric/duodenal ulcers, diverticulosis presents with altered mental status, code stroke.   Patient was at [employer] with his wife at 8:25 AM this morning when he went into the bathroom completely his normal self.  He did not come out of the bathroom and the wife went in and found him altered.  He is conscious but not following commands. ***  Per chart review patient follows with neurology for a pituitary macroadenoma and headaches.  Due to his symptoms today he no showed for an appointment this morning.  Per documentation, he has a 15 x 18 x 14 enhancing enlarged pituitary gland consistent with macroadenoma.  "macroadenoma is large but not surgical at this point... Need repeat MRI brain."  Follows with neurosurgery, endocrinology, ophthalmology.  HPI     Home Medications Prior to Admission medications   Medication Sig Start Date End Date Taking? Authorizing Provider  acetaminophen (TYLENOL) 500 MG tablet Take 1 tablet (500 mg total) by mouth every 6 (six) hours as needed for mild pain or headache. 12/28/21   Raiford Noble Latif, DO  allopurinol (ZYLOPRIM) 300 MG tablet Take 300 mg by mouth daily.    [provider]  escitalopram (LEXAPRO) 20 MG tablet Take 20 mg by mouth at bedtime.     [provider]  Etanercept (ENBREL [place]) Inject 1 Dose into the skin every Monday.    [provider]  folic acid (FOLVITE) 1 MG tablet Take 1 mg by mouth daily.    [provider]  loperamide (IMODIUM) 2 MG capsule Take 1 capsule (2 mg total) by mouth every 6 (six) hours as needed for diarrhea or  loose stools. 01/14/22   Kathie Dike, MD  losartan (COZAAR) 25 MG tablet Take 25 mg by mouth daily. 05/29/22   [provider]  Omega-3 Fatty Acids (FISH OIL) 1200 MG CAPS Take 6,000 mg by mouth daily. Takes 5 capsules every morning    [provider]  ondansetron (ZOFRAN-ODT) 8 MG disintegrating tablet Take 1 tablet (8 mg total) by mouth every 8 (eight) hours as needed for nausea or vomiting. 01/04/22   Melvenia Beam, MD  oxyCODONE-acetaminophen (PERCOCET) 10-325 MG tablet Take 1 tablet by mouth every 6 (six) hours as needed for pain. 01/03/22   Melvenia Beam, MD  pantoprazole (PROTONIX) 40 MG tablet Take 40 mg by mouth daily.    [provider]  pravastatin (PRAVACHOL) 40 MG tablet Take 40 mg by mouth daily. 04/15/21   [provider]  predniSONE (DELTASONE) 5 MG tablet Take 5 mg by mouth daily with breakfast.    [provider]  propranolol ER (INDERAL LA) 60 MG 24 hr capsule Take 1 capsule (60 mg total) by mouth daily. Please call and schedule appointment for March 2024 or sooner if symptoms change. 06/16/22   Frann Rider, NP  traMADol (ULTRAM) 50 MG tablet Take 50 mg by mouth 2 (two) times daily as needed. 06/02/22   [provider]  traZODone (DESYREL) 50 MG tablet Take 50 mg by mouth at bedtime as needed for sleep. 02/13/21   [provider]  Allergies    Patient has no known allergies.    Review of Systems   Review of Systems Review of systems {pos/neg:18640::"Negative","Positive"} for ***.  A 10 point review of systems was performed and is negative unless otherwise reported in HPI.  Physical Exam Updated Vital Signs There were no vitals taken for this visit. Physical Exam General: Normal appearing {Desc; male/male:11659}, lying in bed.  HEENT: PERRLA, Sclera anicteric, MMM, trachea midline.  Cardiology: RRR, no murmurs/rubs/gallops. BL radial and DP pulses equal bilaterally.  Resp: Normal respiratory rate and  effort. CTAB, no wheezes, rhonchi, crackles.  Abd: Soft, non-tender, non-distended. No rebound tenderness or guarding.  GU: Deferred. MSK: No peripheral edema or signs of trauma. Extremities without deformity or TTP. No cyanosis or clubbing. Skin: warm, dry. No rashes or lesions. Back: No CVA tenderness Neuro: A&Ox4, CNs II-XII grossly intact. MAEs. Sensation grossly intact.  Psych: Normal mood and affect.   ED Results / Procedures / Treatments   Labs (all labs ordered are listed, but only abnormal results are displayed) Labs Reviewed  CBG MONITORING, ED - Abnormal; Notable for the following components:      Result Value   Glucose-Capillary 165 (*)    All other components within normal limits  ETHANOL  PROTIME-INR  APTT  CBC  DIFFERENTIAL  COMPREHENSIVE METABOLIC PANEL  RAPID URINE DRUG SCREEN, HOSP PERFORMED  URINALYSIS, ROUTINE W REFLEX MICROSCOPIC  I-STAT CHEM 8, ED    EKG None  Radiology No results found.  Procedures Procedures  {Document cardiac monitor, telemetry assessment procedure when appropriate:1}  Medications Ordered in ED Medications  LORazepam (ATIVAN) injection 1 mg (has no administration in time range)  LORazepam (ATIVAN) 2 MG/ML injection (has no administration in time range)    ED Course/ Medical Decision Making/ A&P                          Medical Decision Making   This patient presents to the ED for concern of AMS, code stroked from the field; this involves an extensive number of treatment options, and is a complaint that carries with it a high risk of complications and morbidity.  I considered the following differential and admission for this acute, potentially life threatening condition.   MDM:    Ddx of acute altered mental status or encephalopathy considered but not limited to: -Intracranial abnormalities such as ICH, ischemic CVA, head trauma, hydrocephalus. Patient's NIHSS is ***.  -Infection such as UTI, PNA, or meningitis. Was  previously in his [place] per the wife on scene, no report of any infectious symptoms recently.  -Toxic ingestion such as opioid overdose, anticholinergic toxicity -Electrolyte abnormalities or hyper/hypoglycemia -Hypercarbia or hypoxia -Hepatic encephalopathy or uremia -ACS or arrhythmia -Endocrine abnormality such as thyroid storm or myxedema coma       Labs: I Ordered, and personally interpreted labs.  The pertinent results include:  ***  Imaging Studies ordered: I ordered imaging studies including CTH, CTA H&N I independently visualized and interpreted imaging. I agree with the radiologist interpretation  Additional history obtained from ***.  External records from outside source obtained and reviewed including ***  Cardiac Monitoring: The patient was maintained on a cardiac monitor.  I personally viewed and interpreted the cardiac monitored which showed an underlying rhythm of: ***  Reevaluation: After the interventions noted above, I reevaluated the patient and found that they have :{resolved/improved/worsened:23923::"improved"}  Social Determinants of Health: ***  Disposition:  ***  Co morbidities that complicate  the patient evaluation  Past Medical History:  Diagnosis Date   Anxiety and depression    Chronic renal insufficiency, stage 3 (moderate) (HCC)    DDD (degenerative disc disease), lumbar    remote hx of fusion surgery   GERD (gastroesophageal reflux disease)    Gout    History of stomach ulcers 2000   Hyperlipidemia    Hypertension    Rheumatoid arthritis (HCC)    Rheumatoid      Medicines Meds ordered this encounter  Medications   LORazepam (ATIVAN) injection 1 mg   LORazepam (ATIVAN) 2 MG/ML injection    Jardin, Carla G: cabinet override    I have reviewed the patients home medicines and have made adjustments as needed  Problem List / ED Course: Problem List Items Addressed This Visit   None     {Document critical care time when  appropriate:1} {Document review of labs and clinical decision tools ie heart score, Chads2Vasc2 etc:1}  {Document your independent review of radiology images, and any outside records:1} {Document your discussion with family members, caretakers, and with consultants:1} {Document social determinants of health affecting pt's care:1} {Document your decision making why or why not admission, treatments were needed:1}  This note was created using dictation software, which may contain spelling or grammatical errors.

## 2022-08-26 NOTE — Progress Notes (Signed)
PHARMACIST CODE STROKE RESPONSE  Notified to mix TNK at 10:12 by Dr. Lorrin Goodell TNK preparation completed at 10:17  TNK dose = 18 mg IV over 5 seconds  Issues/delays encountered (if applicable): Ativan '1mg'$  given to pt to tolerate laying still for imaging on CT scanner. TNK prepared during CTA/P imaging. TNK took some extra time to dissolve into solution. TNK administered to pt at completion of CT scan at 10:19.   Luisa Hart, PharmD, BCPS Clinical Pharmacist 08/26/2022 10:21 AM

## 2022-08-26 NOTE — Telephone Encounter (Signed)
Pt's wife, Caroline Matters called was on the way to appt, while at [place] pt went to the bathroom. After pt did not come out; wife went to the bathroom. Pt said could not move, called EMS. EMS said pt may have had a stroke. Pt was taken by EMS to [facility].

## 2022-08-26 NOTE — ED Provider Notes (Signed)
[facility] EMERGENCY DEPARTMENT Provider Note   CSN: [id number] Arrival date & time: 08/26/22  1000     History {Add pertinent medical, surgical, social history, OB history to HPI:1} Chief Complaint  Patient presents with   Code Stroke    [name] is a 76 y.o. male.  Pt presented as code stroke activation with acute onset aphasia/altered mental status and given tnk.  Pt limited historian - level 5 caveat, altered ms and aphasia. Spouse indicates pt appeared at baseline this AM, they were in fast food restaurant b/c pt wanted to stop there, pt c/o abdominal pain and went to bathroom, but never came out - found pt leaning against wall, altered.  Pt not currently expressing any specific physical c/o or pain. No report of fevers. No report of trauma/fall. No report of chest pain or trouble breathing. Had c/o abd pain earlier.   The history is provided by the patient, medical records, the spouse and the EMS personnel. The history is limited by the condition of the patient.       Home Medications Prior to Admission medications   Medication Sig Start Date End Date Taking? Authorizing Provider  acetaminophen (TYLENOL) 500 MG tablet Take 1 tablet (500 mg total) by mouth every 6 (six) hours as needed for mild pain or headache. 12/28/21  Yes Sheikh, Omair Latif, DO  allopurinol (ZYLOPRIM) 300 MG tablet Take 300 mg by mouth daily.   Yes [provider]  escitalopram (LEXAPRO) 20 MG tablet Take 20 mg by mouth at bedtime.    Yes [provider]  Etanercept (ENBREL [place]) Inject 1 Dose into the skin every Monday.   Yes [provider]  folic acid (FOLVITE) 1 MG tablet Take 1 mg by mouth daily.   Yes [provider]  ibuprofen (ADVIL) 600 MG tablet Take 600 mg by mouth every 6 (six) hours as needed for mild pain. 07/14/22  Yes [provider]  loperamide (IMODIUM) 2 MG capsule Take 1 capsule (2 mg total) by mouth every 6 (six) hours  as needed for diarrhea or loose stools. 01/14/22  Yes Kathie Dike, MD  losartan-hydrochlorothiazide (HYZAAR) 100-25 MG tablet Take 1 tablet by mouth daily. 08/22/22  Yes [provider]  Omega-3 Fatty Acids (FISH OIL) 1200 MG CAPS Take 6,000 mg by mouth daily. Takes 5 capsules every morning   Yes [provider]  pantoprazole (PROTONIX) 40 MG tablet Take 40 mg by mouth daily as needed (pain).   Yes [provider]  pravastatin (PRAVACHOL) 40 MG tablet Take 40 mg by mouth daily. 04/15/21  Yes [provider]  predniSONE (DELTASONE) 5 MG tablet Take 5 mg by mouth daily with breakfast.   Yes [provider]  propranolol ER (INDERAL LA) 60 MG 24 hr capsule Take 1 capsule (60 mg total) by mouth daily. Please call and schedule appointment for March 2024 or sooner if symptoms change. 06/16/22  Yes McCue, Janett Billow, NP  testosterone cypionate (DEPOTESTOSTERONE CYPIONATE) 200 MG/ML injection Inject 200 mg into the muscle every 14 (fourteen) days. 07/30/22  Yes [provider]  traMADol (ULTRAM) 50 MG tablet Take 50 mg by mouth 2 (two) times daily as needed for moderate pain. 06/02/22  Yes [provider]  traZODone (DESYREL) 50 MG tablet Take 50 mg by mouth at bedtime as needed for sleep. 02/13/21  Yes [provider]  amLODipine (NORVASC) 5 MG tablet Take 5 mg by mouth daily. 08/22/22   [provider]  ondansetron (ZOFRAN-ODT) 8 MG disintegrating tablet Take 1 tablet (8 mg total) by mouth every 8 (eight) hours as needed for nausea or vomiting. Patient not taking: Reported on 08/26/2022 01/04/22   Melvenia Beam, MD  oxyCODONE-acetaminophen (PERCOCET) 10-325 MG tablet Take 1 tablet by mouth every 6 (six) hours as needed for pain. Patient not taking: Reported on 08/26/2022 01/03/22   Melvenia Beam, MD      Allergies    Patient has no known allergies.    Review of Systems   Review of Systems  Unable to perform ROS: Mental  status change    Physical Exam Updated Vital Signs BP (!) 146/86   Pulse 84   Resp 17   SpO2 100%  Physical Exam Vitals and nursing note reviewed.  Constitutional:      Appearance: Normal appearance. He is well-developed.  HENT:     Head: Atraumatic.     Nose: Nose normal.     Mouth/Throat:     Mouth: Mucous membranes are moist.     Pharynx: Oropharynx is clear.  Eyes:     General: No scleral icterus.    Conjunctiva/sclera: Conjunctivae normal.     Pupils: Pupils are equal, round, and reactive to light.  Neck:     Vascular: No carotid bruit.     Trachea: No tracheal deviation.     Comments: No stiffness or rigidity.  Cardiovascular:     Rate and Rhythm: Normal rate and regular rhythm.     Pulses: Normal pulses.     Heart sounds: Normal heart sounds. No murmur heard.    No friction rub. No gallop.  Pulmonary:     Effort: Pulmonary effort is normal. No accessory muscle usage or respiratory distress.     Breath sounds: Normal breath sounds.  Abdominal:     General: Bowel sounds are normal. There is no distension.     Palpations: Abdomen is soft.     Tenderness: There is no abdominal tenderness. There is no guarding.  Genitourinary:    Comments: No cva tenderness. Musculoskeletal:        General: No swelling or tenderness.     Cervical back: Normal range of motion and neck supple. No rigidity.     Comments: CTLS spine, non tender, aligned, no step off. No focal pain or bony tenderness on bilateral extremity exam.   Skin:    General: Skin is warm and dry.     Findings: No rash.  Neurological:     Mental Status: He is alert.     Comments: Awake, eyes open. Moves bil extremities purposefully. Not following commands. Altered/not verbally responding to questions asked.      ED Results / Procedures / Treatments   Labs (all labs ordered are listed, but only abnormal results are displayed) Results for orders placed or performed during the hospital encounter of 08/26/22   Ethanol  Result Value Ref Range   Alcohol, Ethyl (B) <10 <10 mg/dL  Protime-INR  Result Value Ref Range   Prothrombin Time 15.0 11.4 - 15.2 seconds   INR 1.2 0.8 - 1.2  APTT  Result Value Ref Range   aPTT 22 (L) 24 - 36 seconds  CBC  Result Value Ref Range   WBC 12.8 (H) 4.0 - 10.5 K/uL   RBC 3.87 (L) 4.22 - 5.81 MIL/uL   Hemoglobin 12.0 (L) 13.0 - 17.0 g/dL   HCT 35.0 (L) 39.0 - 52.0 %   MCV 90.4 80.0 - 100.0  fL   MCH 31.0 26.0 - 34.0 pg   MCHC 34.3 30.0 - 36.0 g/dL   RDW 14.0 11.5 - 15.5 %   Platelets 182 150 - 400 K/uL   nRBC 0.0 0.0 - 0.2 %  Differential  Result Value Ref Range   Neutrophils Relative % 65 %   Neutro Abs 8.4 (H) 1.7 - 7.7 K/uL   Lymphocytes Relative 26 %   Lymphs Abs 3.3 0.7 - 4.0 K/uL   Monocytes Relative 6 %   Monocytes Absolute 0.8 0.1 - 1.0 K/uL   Eosinophils Relative 2 %   Eosinophils Absolute 0.2 0.0 - 0.5 K/uL   Basophils Relative 0 %   Basophils Absolute 0.1 0.0 - 0.1 K/uL   Immature Granulocytes 1 %   Abs Immature Granulocytes 0.08 (H) 0.00 - 0.07 K/uL  Comprehensive metabolic panel  Result Value Ref Range   Sodium 138 135 - 145 mmol/L   Potassium 4.0 3.5 - 5.1 mmol/L   Chloride 104 98 - 111 mmol/L   CO2 24 22 - 32 mmol/L   Glucose, Bld 143 (H) 70 - 99 mg/dL   BUN 29 (H) 8 - 23 mg/dL   Creatinine, Ser 1.81 (H) 0.61 - 1.24 mg/dL   Calcium 8.4 (L) 8.9 - 10.3 mg/dL   Total Protein 6.3 (L) 6.5 - 8.1 g/dL   Albumin 3.2 (L) 3.5 - 5.0 g/dL   AST 29 15 - 41 U/L   ALT 19 0 - 44 U/L   Alkaline Phosphatase 57 38 - 126 U/L   Total Bilirubin 0.8 0.3 - 1.2 mg/dL   GFR, Estimated 38 (L) >60 mL/min   Anion gap 10 5 - 15  Urinalysis, Routine w reflex microscopic  Result Value Ref Range   Color, Urine YELLOW YELLOW   APPearance CLEAR CLEAR   Specific Gravity, Urine 1.029 1.005 - 1.030   pH 7.0 5.0 - 8.0   Glucose, UA NEGATIVE NEGATIVE mg/dL   Hgb urine dipstick NEGATIVE NEGATIVE   Bilirubin Urine NEGATIVE NEGATIVE   Ketones, ur NEGATIVE  NEGATIVE mg/dL   Protein, ur NEGATIVE NEGATIVE mg/dL   Nitrite NEGATIVE NEGATIVE   Leukocytes,Ua NEGATIVE NEGATIVE  CBG monitoring, ED  Result Value Ref Range   Glucose-Capillary 165 (H) 70 - 99 mg/dL  I-stat chem 8, ED  Result Value Ref Range   Sodium 140 135 - 145 mmol/L   Potassium 4.1 3.5 - 5.1 mmol/L   Chloride 104 98 - 111 mmol/L   BUN 37 (H) 8 - 23 mg/dL   Creatinine, Ser 1.70 (H) 0.61 - 1.24 mg/dL   Glucose, Bld 140 (H) 70 - 99 mg/dL   Calcium, Ion 1.03 (L) 1.15 - 1.40 mmol/L   TCO2 26 22 - 32 mmol/L   Hemoglobin 13.9 13.0 - 17.0 g/dL   HCT 41.0 39.0 - 52.0 %   CT ANGIO HEAD NECK W WO CM  Result Date: 08/26/2022 CLINICAL DATA:  Stroke suspected. EXAM: CT ANGIOGRAPHY HEAD AND NECK TECHNIQUE: Multidetector CT imaging of the head and neck was performed using the standard protocol during bolus administration of intravenous contrast. Multiplanar CT image reconstructions and MIPs were obtained to evaluate the vascular anatomy. Carotid stenosis measurements (when applicable) are obtained utilizing NASCET criteria, using the distal internal carotid diameter as the denominator. RADIATION DOSE REDUCTION: This exam was performed according to the departmental dose-optimization program which includes automated exposure control, adjustment of the mA and/or kV according to patient size and/or use of iterative reconstruction technique. CONTRAST:  76m OMNIPAQUE IOHEXOL 350 MG/ML SOLN COMPARISON:  None Available. FINDINGS AND IMPRESSION: Please see same day prior CTA head/neck angiogram dictation. Prior dictation, which was time stamped at 10:11:35 AM was based on interpretation for the images available on this accession ((#[id number]). Electronically Signed   By: HMarin RobertsM.D.   On: 08/26/2022 11:04   CT ANGIO HEAD NECK W WO CM (CODE STROKE)  Result Date: 08/26/2022 CLINICAL DATA:  Stroke suspected. Aphasia EXAM: CT HEAD WITHOUT CONTRAST CT ANGIOGRAPHY OF THE HEAD AND NECK TECHNIQUE:  Contiguous axial images were obtained from the base of the skull through the vertex without intravenous contrast. Multidetector CT imaging of the head and neck was performed using the standard protocol during bolus administration of intravenous contrast. Multiplanar CT image reconstructions and MIPs were obtained to evaluate the vascular anatomy. Carotid stenosis measurements (when applicable) are obtained utilizing NASCET criteria, using the distal internal carotid diameter as the denominator. RADIATION DOSE REDUCTION: This exam was performed according to the departmental dose-optimization program which includes automated exposure control, adjustment of the mA and/or kV according to patient size and/or use of iterative reconstruction technique. CONTRAST:  1569mOMNIPAQUE IOHEXOL 350 MG/ML SOLN COMPARISON:  MRI Brain 04/06/22 FINDINGS: CT HEAD Brain: Sequela of severe chronic microvascular ischemic change. Chronic mineralization of the right basal ganglia. There is no CT evidence of an acute infarct. No hemorrhage. No hydrocephalus. Size and shape of the ventricular system is unchanged compared to prior exam. No extra-axial fluid collection. 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 Vascular: No hyperdense vessel.  See below for additional findings. Skull: Normal. Negative for fracture or focal lesion. Sinuses/Orbits: No acute finding.  Bilateral lens replacement. CTA NECK Aortic arch: There is soft atherosclerotic plaque, most notably after the takeoff of the left subclavian artery. No dissection. There is soft atherosclerotic plaque in the proximal left subclavian artery resulting in mild stenosis. Right carotid system: No hemodynamically significant stenosis, dissection, or aneurysm. Left carotid system: No hemodynamically significant stenosis, dissection, or aneurysm. Vertebral  arteries:The origin of the left vertebral artery is poorly assessed due to the presence of dense calcified atherosclerotic plaque in this region. No hemodynamically significant stenosis, dissection, or aneurysm. Skeleton: Negative Other neck: Negative CTA HEAD Anterior circulation: No aneurysm, stenosis, or occlusion. Posterior circulation:  No aneurysm, stenosis, or occlusion. Venous sinuses: Patent when accounting for timing of contrast bolus. Anatomic variants: None IMPRESSION: 1. No acute intracranial abnormality. 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 2. No emergent large vessel occlusion. 3. No hemodynamically significant stenosis in the neck. 4. Redemonstrated pituitary macroadenoma, which invades the right cavernous sinus. Findings were discussed with Dr. KhLorrin Goodelln 08/26/22 at 10:41 AM. Aortic Atherosclerosis (ICD10-I70.0). Electronically Signed   By: HeMarin Roberts.D.   On: 08/26/2022 10:57   CT HEAD WO CONTRAST (5MM)  Result Date: 08/26/2022 CLINICAL DATA:  Stroke suspected. Aphasia EXAM: CT HEAD WITHOUT CONTRAST CT ANGIOGRAPHY OF THE HEAD AND NECK TECHNIQUE: Contiguous axial images were obtained from the base of the skull through the vertex without intravenous contrast. Multidetector CT imaging of the head and neck was performed using the standard protocol during bolus administration of intravenous contrast. Multiplanar CT image reconstructions and MIPs were obtained to evaluate the vascular anatomy. Carotid stenosis measurements (when applicable) are obtained utilizing NASCET criteria, using the distal internal carotid diameter as the denominator. RADIATION DOSE REDUCTION:  This exam was performed according to the departmental dose-optimization program which includes automated exposure control, adjustment of the mA and/or kV according to patient size and/or use of iterative reconstruction technique. CONTRAST:  125m OMNIPAQUE IOHEXOL 350 MG/ML SOLN COMPARISON:  MRI Brain 04/06/22  FINDINGS: CT HEAD Brain: Sequela of severe chronic microvascular ischemic change. Chronic mineralization of the right basal ganglia. There is no CT evidence of an acute infarct. No hemorrhage. No hydrocephalus. Size and shape of the ventricular system is unchanged compared to prior exam. No extra-axial fluid collection. 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 Vascular: No hyperdense vessel.  See below for additional findings. Skull: Normal. Negative for fracture or focal lesion. Sinuses/Orbits: No acute finding.  Bilateral lens replacement. CTA NECK Aortic arch: There is soft atherosclerotic plaque, most notably after the takeoff of the left subclavian artery. No dissection. There is soft atherosclerotic plaque in the proximal left subclavian artery resulting in mild stenosis. Right carotid system: No hemodynamically significant stenosis, dissection, or aneurysm. Left carotid system: No hemodynamically significant stenosis, dissection, or aneurysm. Vertebral arteries:The origin of the left vertebral artery is poorly assessed due to the presence of dense calcified atherosclerotic plaque in this region. No hemodynamically significant stenosis, dissection, or aneurysm. Skeleton: Negative Other neck: Negative CTA HEAD Anterior circulation: No aneurysm, stenosis, or occlusion. Posterior circulation:  No aneurysm, stenosis, or occlusion. Venous sinuses: Patent when accounting for timing of contrast bolus. Anatomic variants: None IMPRESSION: 1. No acute intracranial abnormality. 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 2. No emergent large vessel occlusion. 3. No hemodynamically significant stenosis in the neck. 4. Redemonstrated pituitary macroadenoma, which invades the right cavernous sinus. Findings were discussed with Dr. KLorrin Goodellon 08/26/22 at 10:41 AM. Aortic  Atherosclerosis (ICD10-I70.0). Electronically Signed   By: HMarin RobertsM.D.   On: 08/26/2022 10:57   CT HEAD CODE STROKE WO CONTRAST  Result Date: 08/26/2022 CLINICAL DATA:  Code stroke. Neuro deficit, acute, stroke suspected. EXAM: CT HEAD WITHOUT CONTRAST TECHNIQUE: Contiguous axial images were obtained from the base of the skull through the vertex without intravenous contrast. RADIATION DOSE REDUCTION: This exam was performed according to the departmental dose-optimization program which includes automated exposure control, adjustment of the mA and/or kV according to patient size and/or use of iterative reconstruction technique. COMPARISON:  Brain MRI 04/06/2022.  Head CT 01/09/2022. FINDINGS: Brain: Mild generalized cerebral atrophy. Advanced patchy and ill-defined hypoattenuation within the cerebral white matter, nonspecific but compatible with chronic small vessel disease. Known pituitary macroadenoma, incompletely assessed on this non-contrast head CT, but grossly unchanged in size from the prior brain MRI of 04/06/2022. There is no acute intracranial hemorrhage. No acute demarcated cortical infarct. No extra-axial fluid collection. No midline shift. Vascular: No hyperdense vessel. Atherosclerotic calcifications. Skull: No fracture or aggressive osseous lesion. Sinuses/Orbits: No mass or acute finding within the imaged orbits. Mild mucosal thickening within the bilateral ethmoid and sphenoid sinuses. ASPECTS ([facility]) - Ganglionic level infarction (caudate, lentiform nuclei, internal capsule, insula, M1-M3 cortex): 7 - Supraganglionic infarction (M4-M6 cortex): 3 Total score (0-10 with 10 being normal): 10 No evidence of acute intracranial hemorrhage or acute infarct. These results were called by telephone at the time of interpretation on 08/26/2022 at 10:15 am to provider Dr. KLorrin Goodell who verbally acknowledged these results. IMPRESSION: 1. No evidence of acute  intracranial hemorrhage or acute infarct.  2. Known pituitary macroadenoma, incompletely assessed on this non-contrast head CT, but grossly unchanged from the prior brain MRI of 04/06/2022. 3. Advanced chronic small vessel image changes within the cerebral white matter. 4. Mild generalized cerebral atrophy. Electronically Signed   By: Kellie Simmering D.O.   On: 08/26/2022 10:36     EKG None  Radiology CT ANGIO HEAD NECK W WO CM  Result Date: 08/26/2022 CLINICAL DATA:  Stroke suspected. EXAM: CT ANGIOGRAPHY HEAD AND NECK TECHNIQUE: Multidetector CT imaging of the head and neck was performed using the standard protocol during bolus administration of intravenous contrast. Multiplanar CT image reconstructions and MIPs were obtained to evaluate the vascular anatomy. Carotid stenosis measurements (when applicable) are obtained utilizing NASCET criteria, using the distal internal carotid diameter as the denominator. RADIATION DOSE REDUCTION: This exam was performed according to the departmental dose-optimization program which includes automated exposure control, adjustment of the mA and/or kV according to patient size and/or use of iterative reconstruction technique. CONTRAST:  36m OMNIPAQUE IOHEXOL 350 MG/ML SOLN COMPARISON:  None Available. FINDINGS AND IMPRESSION: Please see same day prior CTA head/neck angiogram dictation. Prior dictation, which was time stamped at 10:11:35 AM was based on interpretation for the images available on this accession ((#[id number]). Electronically Signed   By: HMarin RobertsM.D.   On: 08/26/2022 11:04   CT ANGIO HEAD NECK W WO CM (CODE STROKE)  Result Date: 08/26/2022 CLINICAL DATA:  Stroke suspected. Aphasia EXAM: CT HEAD WITHOUT CONTRAST CT ANGIOGRAPHY OF THE HEAD AND NECK TECHNIQUE: Contiguous axial images were obtained from the base of the skull through the vertex without intravenous contrast. Multidetector CT imaging of the head and neck was performed using the standard  protocol during bolus administration of intravenous contrast. Multiplanar CT image reconstructions and MIPs were obtained to evaluate the vascular anatomy. Carotid stenosis measurements (when applicable) are obtained utilizing NASCET criteria, using the distal internal carotid diameter as the denominator. RADIATION DOSE REDUCTION: This exam was performed according to the departmental dose-optimization program which includes automated exposure control, adjustment of the mA and/or kV according to patient size and/or use of iterative reconstruction technique. CONTRAST:  1559mOMNIPAQUE IOHEXOL 350 MG/ML SOLN COMPARISON:  MRI Brain 04/06/22 FINDINGS: CT HEAD Brain: Sequela of severe chronic microvascular ischemic change. Chronic mineralization of the right basal ganglia. There is no CT evidence of an acute infarct. No hemorrhage. No hydrocephalus. Size and shape of the ventricular system is unchanged compared to prior exam. No extra-axial fluid collection. 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 Vascular: No hyperdense vessel.  See below for additional findings. Skull: Normal. Negative for fracture or focal lesion. Sinuses/Orbits: No acute finding.  Bilateral lens replacement. CTA NECK Aortic arch: There is soft atherosclerotic plaque, most notably after the takeoff of the left subclavian artery. No dissection. There is soft atherosclerotic plaque in the proximal left subclavian artery resulting in mild stenosis. Right carotid system: No hemodynamically significant stenosis, dissection, or aneurysm. Left carotid system: No hemodynamically significant stenosis, dissection, or aneurysm. Vertebral arteries:The origin of the left vertebral artery is poorly assessed due to the presence of dense calcified atherosclerotic plaque in this region. No hemodynamically significant stenosis,  dissection, or aneurysm. Skeleton: Negative Other neck: Negative CTA HEAD Anterior circulation: No aneurysm, stenosis, or occlusion. Posterior circulation:  No aneurysm, stenosis, or occlusion. Venous sinuses: Patent when accounting for timing of contrast  bolus. Anatomic variants: None IMPRESSION: 1. No acute intracranial abnormality. 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 2. No emergent large vessel occlusion. 3. No hemodynamically significant stenosis in the neck. 4. Redemonstrated pituitary macroadenoma, which invades the right cavernous sinus. Findings were discussed with Dr. Lorrin Goodell on 08/26/22 at 10:41 AM. Aortic Atherosclerosis (ICD10-I70.0). Electronically Signed   By: Marin Roberts M.D.   On: 08/26/2022 10:57   CT HEAD WO CONTRAST (5MM)  Result Date: 08/26/2022 CLINICAL DATA:  Stroke suspected. Aphasia EXAM: CT HEAD WITHOUT CONTRAST CT ANGIOGRAPHY OF THE HEAD AND NECK TECHNIQUE: Contiguous axial images were obtained from the base of the skull through the vertex without intravenous contrast. Multidetector CT imaging of the head and neck was performed using the standard protocol during bolus administration of intravenous contrast. Multiplanar CT image reconstructions and MIPs were obtained to evaluate the vascular anatomy. Carotid stenosis measurements (when applicable) are obtained utilizing NASCET criteria, using the distal internal carotid diameter as the denominator. RADIATION DOSE REDUCTION: This exam was performed according to the departmental dose-optimization program which includes automated exposure control, adjustment of the mA and/or kV according to patient size and/or use of iterative reconstruction technique. CONTRAST:  182m OMNIPAQUE IOHEXOL 350 MG/ML SOLN COMPARISON:  MRI Brain 04/06/22 FINDINGS: CT HEAD Brain: Sequela of severe chronic microvascular ischemic change. Chronic mineralization of the right basal ganglia. There is no CT evidence of an acute infarct. No  hemorrhage. No hydrocephalus. Size and shape of the ventricular system is unchanged compared to prior exam. No extra-axial fluid collection. 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 Vascular: No hyperdense vessel.  See below for additional findings. Skull: Normal. Negative for fracture or focal lesion. Sinuses/Orbits: No acute finding.  Bilateral lens replacement. CTA NECK Aortic arch: There is soft atherosclerotic plaque, most notably after the takeoff of the left subclavian artery. No dissection. There is soft atherosclerotic plaque in the proximal left subclavian artery resulting in mild stenosis. Right carotid system: No hemodynamically significant stenosis, dissection, or aneurysm. Left carotid system: No hemodynamically significant stenosis, dissection, or aneurysm. Vertebral arteries:The origin of the left vertebral artery is poorly assessed due to the presence of dense calcified atherosclerotic plaque in this region. No hemodynamically significant stenosis, dissection, or aneurysm. Skeleton: Negative Other neck: Negative CTA HEAD Anterior circulation: No aneurysm, stenosis, or occlusion. Posterior circulation:  No aneurysm, stenosis, or occlusion. Venous sinuses: Patent when accounting for timing of contrast bolus. Anatomic variants: None IMPRESSION: 1. No acute intracranial abnormality. 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 2. No emergent large vessel occlusion. 3. No hemodynamically significant stenosis in the neck. 4. Redemonstrated pituitary macroadenoma, which invades the right cavernous sinus. Findings were discussed with Dr. KLorrin Goodellon 08/26/22 at 10:41 AM. Aortic Atherosclerosis (ICD10-I70.0). Electronically Signed   By: HMarin RobertsM.D.   On: 08/26/2022 10:57   CT HEAD CODE STROKE WO CONTRAST  Result Date: 08/26/2022 CLINICAL DATA:  Code  stroke. Neuro deficit, acute, stroke suspected. EXAM: CT HEAD WITHOUT CONTRAST TECHNIQUE: Contiguous axial images were obtained from the base of the skull through the vertex without intravenous contrast. RADIATION DOSE REDUCTION: This exam was performed according to the departmental dose-optimization program which includes automated exposure control, adjustment of the mA and/or kV according to patient size and/or use of iterative reconstruction technique. COMPARISON:  Brain MRI 04/06/2022.  Head CT 01/09/2022. FINDINGS: Brain: Mild generalized cerebral atrophy.  Advanced patchy and ill-defined hypoattenuation within the cerebral white matter, nonspecific but compatible with chronic small vessel disease. Known pituitary macroadenoma, incompletely assessed on this non-contrast head CT, but grossly unchanged in size from the prior brain MRI of 04/06/2022. There is no acute intracranial hemorrhage. No acute demarcated cortical infarct. No extra-axial fluid collection. No midline shift. Vascular: No hyperdense vessel. Atherosclerotic calcifications. Skull: No fracture or aggressive osseous lesion. Sinuses/Orbits: No mass or acute finding within the imaged orbits. Mild mucosal thickening within the bilateral ethmoid and sphenoid sinuses. ASPECTS [facility] Stroke Program Early CT Score) - Ganglionic level infarction (caudate, lentiform nuclei, internal capsule, insula, M1-M3 cortex): 7 - Supraganglionic infarction (M4-M6 cortex): 3 Total score (0-10 with 10 being normal): 10 No evidence of acute intracranial hemorrhage or acute infarct. These results were called by telephone at the time of interpretation on 08/26/2022 at 10:15 am to provider Dr. Lorrin Goodell, who verbally acknowledged these results. IMPRESSION: 1. No evidence of acute intracranial hemorrhage or acute infarct. 2. Known pituitary macroadenoma, incompletely assessed on this non-contrast head CT, but grossly unchanged from the prior brain MRI of 04/06/2022. 3.  Advanced chronic small vessel image changes within the cerebral white matter. 4. Mild generalized cerebral atrophy. Electronically Signed   By: Kellie Simmering D.O.   On: 08/26/2022 10:36    Procedures Procedures  {Document cardiac monitor, telemetry assessment procedure when appropriate:1}  Medications Ordered in ED Medications  LORazepam (ATIVAN) injection 1 mg (1 mg Intravenous Given 08/26/22 1100)  LORazepam (ATIVAN) 2 MG/ML injection (has no administration in time range)   stroke: early stages of recovery book (has no administration in time range)  0.9 %  sodium chloride infusion (has no administration in time range)  acetaminophen (TYLENOL) tablet 650 mg (has no administration in time range)    Or  acetaminophen (TYLENOL) 160 MG/5ML solution 650 mg (has no administration in time range)    Or  acetaminophen (TYLENOL) suppository 650 mg (has no administration in time range)  senna-docusate (Senokot-S) tablet 1 tablet (has no administration in time range)  pantoprazole (PROTONIX) injection 40 mg (has no administration in time range)  haloperidol lactate (HALDOL) injection 2 mg (has no administration in time range)  LORazepam (ATIVAN) injection 1 mg (has no administration in time range)  haloperidol lactate (HALDOL) 5 MG/ML injection (has no administration in time range)  labetalol (NORMODYNE) injection 10 mg (10 mg Intravenous Given 08/26/22 1120)    And  clevidipine (CLEVIPREX) infusion 0.5 mg/mL (has no administration in time range)  tenecteplase (TNKASE) injection for Stroke 18 mg (18 mg Intravenous Given 08/26/22 1019)  iohexol (OMNIPAQUE) 350 MG/ML injection 150 mL (150 mLs Intravenous Contrast Given 08/26/22 1021)  LORazepam (ATIVAN) 2 MG/ML injection (1 mg  Given 08/26/22 1050)  iohexol (OMNIPAQUE) 350 MG/ML injection 75 mL (75 mLs Intravenous Contrast Given 08/26/22 1042)  haloperidol lactate (HALDOL) injection 2 mg (2 mg Intravenous Given 08/26/22 1059)  haloperidol lactate  (HALDOL) injection 2 mg (2 mg Intravenous Given 08/26/22 1114)    ED Course/ Medical Decision Making/ A&P Clinical Course as of 08/26/22 1135  Fri Aug 26, 2022  1018 Glucose-Capillary(!): 165 [HN]  1027 WBC(!): 12.8 [HN]  1027 Hemoglobin(!): 12.0 [HN]  1027 Creatinine(!): 1.70 [HN]  1030 Ativan 1 mg IV given to patient to tolerate lying still in CT scanner. TNK was given. [HN]    Clinical Course User Index [HN] Audley Hose, MD  Medical Decision Making Amount and/or Complexity of Data Reviewed Labs: ordered. Decision-making details documented in ED Course. Radiology: ordered. ECG/medicine tests: ordered.  Risk Decision regarding hospitalization.   Iv ns. Continuous pulse ox and cardiac monitoring. Labs ordered/sent. Imaging ordered.   Reviewed nursing notes and prior charts for additional history. External reports reviewed. Additional history from: EMS, family.  Cardiac monitor: sinus rhythm, rate 80.  Labs reviewed/interpreted by me - ckd. K normal.   Xrays reviewed/interpreted by me - no pna.   CT reviewed/interpreted by me - no hem.  Pt arrived as code stroke activation and neurology evaluated at bridge/emergently consulted. Neurology gave pt TNK, and plans to admit, remainder of workup currently pending.      {Document critical care time when appropriate:1} {Document review of labs and clinical decision tools ie heart score, Chads2Vasc2 etc:1}  {Document your independent review of radiology images, and any outside records:1} {Document your discussion with family members, caretakers, and with consultants:1} {Document social determinants of health affecting pt's care:1} {Document your decision making why or why not admission, treatments were needed:1} Final Clinical Impression(s) / ED Diagnoses Final diagnoses:  None    Rx / DC Orders ED Discharge Orders     None

## 2022-08-26 NOTE — Progress Notes (Signed)
Pt's wife [name] states that he would want to be a DNR. No immediate answer from on duty MD, Will pass on to upcoming shift.

## 2022-08-26 NOTE — Progress Notes (Signed)
PATIENT NO SHOWED TODAY: PRECHARTING BELOW FOR NEXT APPOINTMENT   [facility]    Provider:  Dr Jaynee Eagles Requesting Provider: Deon Pilling, NP Primary Care Provider:  Deon Pilling, NP  CC:  headaches in the setting of newly discovered pituitary macroadenoma  Follow up Dr. Jaynee Eagles 08/26/2022: Patient here with pituitary macroadenoma and headache. The macroadenoma is large but not surgical at this point and has been causing headache. My NP tried propranolol several months ago. Difficult to treat headaches due to mass. Need repeat MRI brain. He sees Dr. Arnoldo Morale.It appears he is having surgery 09/29/2022 so in the meantime we may need to try to push through. He sees neurosurgery and endocrinology and ophthalmology.   Follow up  06/16/22 Janett Billow NP [name] here today for follow-up.   Repeat MRI brain 04/2022 showed stable appearance of pituitary adenoma.  He was seen by ophthalmology 6/12 without any evidence of visual field defect, plans on follow-up next year.  Denies being seen by neurosurgery recently, is scheduled with endo on 11/30, he questions if this appointment is needed. He is currently taking propranolol 60 mg daily which has been helping with his headaches, has not had any recent headaches, tolerating without side effects. He has no questions or concerns today.   Per Janett Billow assessment and plan ast seen 06/16/2022:   1.  Pituitary macroadenoma, extrasellar extension, visual field cut 2.  Headache  -MRI of the brain in August 2023 showed no change, did encourage him to keep f/u visit as scheduled with endocrinology next month, unable to see NS notes via epic, will call office to see when prior OV was and if notes can be obtained.  -Plan on repeat MRI brain in 10/2021 for 60-monthsurveillance monitoring per Dr. AJaynee Eaglesrecommendations -Continue propranolol 60 mg nightly for headache prevention, refill provided, in future if needed emergent medication as needed, triptans  will need to be avoided due to risk of pituitary apoplexy -will f/u in 6 months or sooner if needed     ADDENDUM 06/20/2022 Dr. AJaynee Eagles Received prior OV note from neurosurgery Dr. JArnoldo Moralefrom visit on 01/20/2022.  Reviewed note and in summary, did not believe pituitary macroadenoma causing any problems and ultimately decided on observation, recommended follow-up in a couple months or sooner if needed.    MRI of the brain 04/06/22 IMPRESSION: This MRI of the brain with and without contrast with added attention to the pituitary gland shows the following: 1.  15 x 18 x 14 enhancing enlarged pituitary gland consistent with macroadenoma.  It is unchanged compared to the 12/28/2021 MRI.  It approaches the optic chiasm without distorting or compressing it. 2.  Moderate generalized cortical atrophy. 3.   Extensive T2/FLAIR hyperintense foci in the hemispheres consistent with moderately severe chronic microvascular ischemic changes.  This is stable compared to the 12/27/2021 MRI. 4.   No acute findings.  Normal enhancement pattern.      Follow-up Feb 02, 2022 Dr AJaynee Eagles Since patient has been seen, he has been admitted into the hospital, repeat MRI of the brain showed a stable pituitary adenoma, he is also been seen by endocrinology and neurosurgery. neurosurgery. Still pending ophthalmology. He saw endocrinology and the tumor would not be responsive to any medication so if it grows will need to be removal. His headache is better. He is seeing ophthalmology June 12th, Seeing Dr. SArlyce Harmaneye doctor, they will send me a note. He does not feel his vision is worsening. Hasn;t had a headache since leaving  the hospital. Repeat MRI brain was stable. Would repeat in 3-4 months unless having symptoms. So far no new symptoms. We will call to repeat it in 3 . Seeing NSY in August will repeat in prior to neurosurgery 4th. We will repeat MRI early August. Will call the early July to remind him that repeat it and schedule it  late July in preparation for neurosurgery appointment. He feels good right now   Patient complains of symptoms per HPI as well as the following symptoms: diverticulitis . Pertinent negatives and positives per HPI. All others negative   Repeat MRI brain/orbits 01/14/2022: MRI ORBITS FINDINGS   Orbits: Very limited by motion. No detected optic nerve edema. Unremarkable appearance of the globes, orbital fat, extraocular muscles, and lacrimal glands.   Visualized sinuses: Mild mucosal thickening in ethmoid sinuses.   Soft tissues: Negative   IMPRESSION: 1. Significantly motion degraded MRI of the brain and orbits. 2. Unchanged appearance of pituitary macro adenoma when compared to the MRI last month. No optic nerve mass effect or signs of apoplexy. 3. No acute finding in the brain or orbits. 4. Chronic small vessel ischemia.   Personally reviewed images additional 5 minutes (in addition to appointment time otp today)  Follow-up Feb 02, 2022: Since patient has been seen, he has been admitted into the hospital, repeat MRI of the brain showed a stable pituitary adenoma, he is also been seen by endocrinology and neurosurgery. neurosurgery. Still pending ophthalmology. He saw endocrinology and the tumor would not be responsive to any medication so if it grows will need to be removal. His headache is better. He is seeing ophthalmology June 12th, Seeing Dr. Arlyce Harman eye doctor, they will send me a note. He does not feel his vision is worsening. Hasn;t had a headache since leaving the hospital. Repeat MRI brain was stable. Would repeat in 3-4 months unless having symptoms. So far no new symptoms. We will call to repeat it in 3 . Seeing NSY in August will repeat in prior to neurosurgery 4th. We will repeat MRI early August. Will call the early July to remind him that repeat it and schedule it late July in preparation for neurosurgery appointment. He feels good right now  Patient complains of symptoms per HPI  as well as the following symptoms: diverticulitis . Pertinent negatives and positives per HPI. All others negative  Repeat MRI brain/orbits 01/14/2022: MRI ORBITS FINDINGS   Orbits: Very limited by motion. No detected optic nerve edema. Unremarkable appearance of the globes, orbital fat, extraocular muscles, and lacrimal glands.   Visualized sinuses: Mild mucosal thickening in ethmoid sinuses.   Soft tissues: Negative   IMPRESSION: 1. Significantly motion degraded MRI of the brain and orbits. 2. Unchanged appearance of pituitary macro adenoma when compared to the MRI last month. No optic nerve mass effect or signs of apoplexy. 3. No acute finding in the brain or orbits. 4. Chronic small vessel ischemia.  Personally reviewed images additional 5 minutes (in addition to appointment time otp today)   HPI:  [name] is a 76 y.o. male here as requested by Deon Pilling, NP for follow-up after being seen in the emergency room.  He has a past medical history of hypertension, hyperlipidemia, CKD stage III, anxiety depression and GERD.  He presented recently after being found to be acutely altered, having constant headache for over a month but acutely worse over the several last week, pain behind the eyes but usually worse on the right, sensitivity to light  and sound, home pain medication such as Tylenol Benadryl without relief, associated symptoms dizziness nausea and reportedly an episode of vomiting.  No other significant symptoms such as fever shortness of breath cough chest pain or diarrhea.  He went to see his eye doctor who sent him to the emergency room as he was disoriented and unable to write his name, EMS was called.  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  Neurology was consulted and given a  migraine cocktail which abated his headache.  Ophthalmology was consulted and he was found to have a newly diagnosed pituitary mass with bitemporal field cut mild disc edema.  His ophthalmology exam showed no emergent or otherwise medical issues or ocular issues to explain his pain.  Ophthalmology recommended formal visual field testing on outpatient basis and establishment with neurosurgery  Patient reports headaches ongoing for the past 2 to 3 months in the setting of pituitary macroadenoma, present in the morning lasting all day worsening around noon, no worsening headaches with increased intracranial pressure such as bowel movements, changing positions waking up in the middle the night coughing or sneezing, they are located frontally and periorbitally with right worse than left, nausea with vomiting, associated photophobia and phonophobia, transient blurriness and dizziness but no diplopia. Today it is throbbng around the eyes, nausea, vomiting, blurry vision, no drainage from nipples, migraine cocktail stopped it, came back the next day. He takes 2 aspirin and 2 pain pills this morning. Son is here and provides information. Pulsating/pounding/light sensitivity. Blurry vision. Pain is severe. Better when laying down.     Reviewed notes, labs and imaging from outside physicians, which showed: MRI HEAD FINDINGS   Brain: No acute infarction, hemorrhage, hydrocephalus, or extra-axial fluid collection. Approximately 1.3 cm pituitary mass with suprasellar extension. This mass comes in close proximity to the optic chiasm. Moderate patchy T2/FLAIR hyperintensities in the white matter, nonspecific but compatible with chronic microvascular ischemic disease. Cerebral atrophy.   Vascular: Major arterial flow voids are maintained skull base.   Skull and upper cervical spine: Normal marrow signal.   Other: No mastoid effusions.   MRI ORBITS FINDINGS   Orbits: No orbital mass or evidence of inflammation.  Normal appearance of the globes, optic nerve-sheath complexes, extraocular muscles, and lacrimal glands. Prominent retro bulbar fat bilaterally.   Visualized sinuses: Mild-to-moderate paranasal sinus mucosal thickening.   Soft tissues: Normal.   IMPRESSION: Due to patient pain, postcontrast imaging was not performed and study was terminated early. Within this limitation:   1. Approximately 1.3 cm pituitary mass with suprasellar extension. This mass comes in close proximity to the optic chiasm. Recommend dedicated pituitary protocol MRI with contrast for further characterization. 2. Otherwise, no evidence of acute intracranial or orbital abnormality. 3. 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    Review of records, medications tried that can be used in migraine/headache management include: Tylenol, Decadron, Fioricet, Lexapro, losartan, meloxicam magnesium, Zofran, prednisone, Compazine, nortriptyline/amitriptyline contraindicated due to age and risk of altered mental status due to tricyclic antidepressants, compazine, phenergan, trazodone  Review of Systems: Patient complains of symptoms per HPI as well as the following symptoms severe eye pain and headache. Pertinent negatives and positives per HPI. All others negative.   OTP Today: Physical exam: Exam: Gen: NAD, conversant,  Neuro: Detailed Neurologic Exam  Speech:    Speech is normal; fluent and spontaneous with normal comprehension.  Cognition:    The patient is oriented to person, place, and time;     recent and remote memory intact;     language fluent;     normal attention, concentration,     fund of knowledge  PRIOR EXAMINATION  Physical exam: Exam: Gen: NAD, conversant, well nourised, well groomed                     CV: RRR, no MRG. No Carotid Bruits. No peripheral edema, warm, nontender Eyes: Conjunctivae clear without exudates or hemorrhage  Neuro: Detailed  Neurologic Exam  Speech:    Speech is normal; fluent and spontaneous with normal comprehension.  Cognition:    The patient is oriented to person, place, and time;     recent and remote memory intact;     language fluent;     normal attention, concentration,     fund of knowledge Cranial Nerves:    The pupils are equal, round, and reactive to light. Pupils too small and photosensitive, difficulty visualizing fundi Decreased left upper quadrant peripheral vision. Extraocular movements are intact. Trigeminal sensation is intact and the muscles of mastication are normal. The face is symmetric. The palate elevates in the midline. Hearing intact. Voice is normal. Shoulder shrug is normal. The tongue has normal motion without fasciculations.   Coordination:    Normal   Gait:    normal.   Motor Observation:    No asymmetry, no atrophy, and no involuntary movements noted. Tone:    Normal muscle tone.    Posture:    Posture is normal. normal erect    Strength:    Strength is V/V in the upper and lower limbs.      Sensation: intact to LT     Reflex Exam:  DTR's:    Deep tendon reflexes in the upper and lower extremities are symmetrical bilaterally.   Toes:    The toes are equivocal bilaterally.   Clonus:    Clonus is absent.    Assessment/Plan:   76 y.o. male here as requested by [name], MD for follow-up after being seen in the emergency room.  He has a past medical history of hypertension, hyperlipidemia, CKD stage III, anxiety depression and GERD.  He presented recently after being found to be acutely altered, having constant headache for over a month(NO HISTORY OF MIGRAINES) but acutely worse over the several last week, pain behind the eyes but usually worse on the right, sensitivity to light and sound, home pain medication such as Tylenol Benadryl without relief, associated symptoms dizziness nausea and reportedly an episode of vomiting.  - Headaches improved. Repeat MRI  stable. Reorder MRi pituitary protocol in July for preparation for NSY appt August 4th, sent a reminder message to myself first of July to order - See eye Doctor for visual field testing, regularly, asked him to send [place] the notes - Will get appointment with me in mid August,sent front staff message to call - asked him to call us for any changes especially vision, headaches or anything concerning so we can repeat sooner if needed  -Pituitary macroadenoma, extrasellar extension, visual field cut, eye pain, new onset headache: Newly diagnosed pituitary mass with a bitemporal field cut and mild disc edema.  But his inpatient ophthalmology exam showed by report no emergent medical issues or ocular issues that may explain his pain.  Ophthalmology recommended formal visual testing on outpatient basis for his chronic eye disease and establishment with an eye care provider. See his eye doctor asap -Neurology was consulted and given his pituitary macroadenoma and is an MRI of the brain was done and did not cause significant compression of his optic apparatus based on MRI, Dr. Arnoldo Morale did not feel significant compression and no surgically presently I recommended follow-up outpatient for visual testing and endocrinology work-up.  MRI of the brain and orbits showed a 1.3 cm pituitary mass with suprasellar extension coming in close proximity to the optic chiasm but not appearing to compress, otherwise no evidence of acute intracranial or orbital abnormality on MRI of the orbits, chronic microvascular ischemic disease and cerebral atrophy. - can try propranolol or Topiramate or the new cgrp medications. Pituitary tumor patients with vascular headaches are generally quite responsive to standard prophylactic migraine drugs (e.g. tricyclic antidepressants, verapamil, beta-blockers). Will try propranolol. Limited oxycodone.  - avoid triptans due to risk of pituitary apolpexy    Cc: Deon Pilling, NP,  Deon Pilling,  NP  Sarina Ill, MD  [facility] [address] [place], [place] [address]  Phone [phone] Fax [phone]

## 2022-08-26 NOTE — Progress Notes (Signed)
Pt attempting to exit bed, is sideways in bed, unable to follow safety instructions. Discussed with MD and pt's wife. Soft waist belt applied at 1800 per order. Floor mats in place, bed alarm on, low and locked.

## 2022-08-26 NOTE — Progress Notes (Signed)
Echocardiogram 2D Echocardiogram has been performed.  Ronny Flurry 08/26/2022, 3:53 PM

## 2022-08-26 NOTE — ED Triage Notes (Signed)
Pt BIBGEMS after being found by his wife in the bathroom at [facility]. He went into bethroom and never came out. Wife went in and found him lip smacking, aphasic, unable to follow commands, not comprehending, and confused. Left sided gaze preference. Right sided tremor.  CBG 106 140/88 BP  Takes lexapro and losartan  Last known well 0825

## 2022-08-26 NOTE — H&P (Signed)
NEUROLOGY CONSULTATION NOTE   Date of service: August 26, 2022 Patient Name: [name] MRN:  [id number] DOB:  07-11-1946 Reason for consult: "Stroke code for L gaze preference and aphasia" Requesting Provider: Audley Hose, MD _ _ _   _ __   _ __ _ _  __ __   _ __   __ _  History of Present Illness  [name] is a 76 y.o. male with PMH significant for CKD3, HTN, RA, HLD, GERD, pituitary macroadenoma and headaches who presents as a code stroke for aphasia and mild R sided weakness.  Him and wife were at [place]. He went to the bathroom at [place] and did not come out. Wife checked on him and found him having trouble moving. EMS called and he was brought in as a code stroke.  On arrival, Rodarius seems shivering. Vitals are normal. He has left gaze preference and is confused with some ?aphasia out of proportion to his confusion. With encouragement, he will look to his right but then go back to looking to his left.  Wife reports he acted this way when he was diagnosed with diverticulitis back in may but at that time, occurred over the course of a couple days and not this rapid.  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  LKW: 0825 on 08/26/22 mRS: 0 tNKASE: Spoke to wife and expressed concerns regarding potential stroke with the gaze preference noted along with concern for aphasia. Thrombectomy: Not offered, no LVO. NIHSS components Score: Comment  1a Level of Conscious 0'[]'$  1'[x]'$  2'[]'$  3'[]'$      1b LOC Questions 0'[]'$  1'[]'$  2'[x]'$       1c LOC Commands 0'[]'$  1'[]'$  2'[x]'$       2 Best Gaze 0'[]'$  1'[x]'$  2'[]'$       3 Visual 0'[x]'$  1'[]'$  2'[]'$  3'[]'$      4 Facial Palsy 0'[]'$  1'[x]'$  2'[]'$  3'[]'$      5a Motor Arm - left 0'[x]'$  1'[]'$  2'[]'$  3'[]'$  4'[]'$  UN'[]'$    5b Motor Arm - Right 0'[x]'$  1'[]'$  2'[]'$  3'[]'$  4'[]'$  UN'[]'$    6a Motor Leg - Left 0'[]'$  1'[x]'$  2'[]'$  3'[]'$  4'[]'$  UN'[]'$    6b Motor Leg - Right 0'[]'$  1'[x]'$  2'[]'$  3'[]'$  4'[]'$  UN'[]'$    7 Limb Ataxia 0'[x]'$  1'[]'$   2'[]'$  3'[]'$  UN'[]'$     8 Sensory 0'[x]'$  1'[]'$  2'[]'$  UN'[]'$      9 Best Language 0'[]'$  1'[]'$  2'[x]'$  3'[]'$      10 Dysarthria 0'[]'$  1'[]'$  2'[x]'$  UN'[]'$      11 Extinct. and Inattention 0'[x]'$  1'[]'$  2'[]'$       TOTAL: 12    ROS   Encephalopathic and limited ROS. Denies any pain.  Past History   Past Medical History:  Diagnosis Date   Anxiety and depression    Chronic renal insufficiency, stage 3 (moderate) (HCC)    DDD (degenerative disc disease), lumbar    remote hx of fusion surgery   GERD (gastroesophageal reflux disease)    Gout    History of stomach ulcers 2000   Hyperlipidemia    Hypertension    Rheumatoid arthritis ([place])    Rheumatoid    Past Surgical History:  Procedure Laterality Date   APPENDECTOMY  1968   back fusion  2000   CHOLECYSTECTOMY N/A 06/06/2017   Procedure: LAPAROSCOPIC CHOLECYSTECTOMY WITH INTRAOPERATIVE CHOLANGIOGRAM;  Surgeon: [name], MD;  Location: [facility];  Service: General;  Laterality: N/A;   EYE  SURGERY     Dr. [name]  lens implants   LUMBAR LAMINECTOMY  2000   TIBIA FRACTURE SURGERY Left 1998   with titanium rods   UMBILICAL HERNIA REPAIR N/A 06/06/2017   Procedure: UMBILICAL HERNIA REPAIR;  Surgeon: [name], MD;  Location: [facility];  Service: General;  Laterality: N/A;   Family History  Problem Relation Age of Onset   Cancer Mother    CVA Mother    Cancer Maternal Grandmother    Healthy Son    Colon cancer Neg Hx    Social History   Socioeconomic History   Marital status: Married    Spouse name: Not on file   Number of children: 1   Years of education: Not on file   Highest education level: Not on file  Occupational History   Not on file  Tobacco Use   Smoking status: Former    Packs/day: 2.00    Years: 20.00    Total pack years: 40.00    Types: Cigarettes    Quit date: 1980    Years since quitting: 44.0   Smokeless tobacco: Never   Tobacco comments:    Stopped early 53s  Vaping Use   Vaping Use: Never used  Substance and Sexual Activity    Alcohol use: No   Drug use: No   Sexual activity: Yes  Other Topics Concern   Not on file  Social History Narrative   Lives at home with spouse   Right handed   Caffeine: 2 cups/day, tea seldom   Social Determinants of Radio broadcast assistant Strain: Not on file  Food Insecurity: Not on file  Transportation Needs: Not on file  Physical Activity: Not on file  Stress: Not on file  Social Connections: Not on file   No Known Allergies  Medications  (Not in a hospital admission)    Vitals   There were no vitals filed for this visit.   There is no height or weight on file to calculate BMI.  Physical Exam   General: Laying comfortably in bed; agitated and hard to redirect.  HENT: Normal oropharynx and mucosa. Normal external appearance of ears and nose.  Neck: Supple, no pain or tenderness  CV: No JVD. No peripheral edema.  Pulmonary: Symmetric Chest rise. Normal respiratory effort.  Abdomen: Soft to touch, non-tender.  Ext: No cyanosis, edema, or deformity  Skin: No rash. Normal palpation of skin.   Musculoskeletal: Normal digits and nails by inspection. No clubbing.   Neurologic Examination  Mental status/Cognition: Alert, oriented to self,but not to place, month or age. Keeps perseverating. Speech/language: Dysarthric speech, non fluent, comprehends some but not all commands. Cranial nerves:   CN II Pupils equal and reactive to light, blinks to threat BL   CN III,IV,VI Left gaze preference but looks to the right.   CN V normal sensation in V1, V2, and V3 segments bilaterally   CN VII no asymmetry, no nasolabial fold flattening   CN VIII normal hearing to speech   CN IX & X normal palatal elevation, no uvular deviation   CN XI 5/5 head turn and 5/5 shoulder shrug bilaterally   CN XII midline tongue protrusion   Motor:  Muscle bulk: normal, tone normal, pronator drift none tremor noted in RUE which seems most consistent with shivering. Unable to do detailed  strength testing secondary to encephalopathy. He can hold both of his arms off the bed without any drift. His legs will gradually drift down  to the bed when held up off the bed. He localizes to pain in all extremities.   Coordination/Complex Motor:  - Finger to Nose unable to assess as patient is not following commands. - Rapid alternating movement unable to assess. - Gait: Deferred for patient's safety. Labs   CBC:  Recent Labs  Lab 08/26/22 1009  HGB 13.9  HCT 32.2    Basic Metabolic Panel:  Lab Results  Component Value Date   NA 140 08/26/2022   K 4.1 08/26/2022   CO2 26 01/14/2022   GLUCOSE 140 (H) 08/26/2022   BUN 37 (H) 08/26/2022   CREATININE 1.70 (H) 08/26/2022   CALCIUM 7.9 (L) 01/14/2022   GFRNONAA 33 (L) 01/14/2022   GFRAA 42 (L) 10/31/2017   Lipid Panel: No results found for: "LDLCALC" HgbA1c: No results found for: "HGBA1C" Urine Drug Screen:     Component Value Date/Time   LABOPIA NONE DETECTED 12/27/2021 1033   COCAINSCRNUR NONE DETECTED 12/27/2021 1033   LABBENZ NONE DETECTED 12/27/2021 1033   AMPHETMU NONE DETECTED 12/27/2021 [place] DETECTED 12/27/2021 1033   LABBARB NONE DETECTED 12/27/2021 1033    Alcohol Level     Component Value Date/Time   ETH <10 12/27/2021 1027    CT Head without contrast(Personally reviewed): CTH was negative for a large hypodensity concerning for a large territory infarct or hyperdensity concerning for an ICH. Known pituitary macroadenoma.  CT angio Head and Neck with contrast(Personally reviewed): No LVO. Had to be repeated given first set of images with poor contrast timing.  MRI Brain: pending  Impression   ROLLAN ROGER is a 76 y.o. male with PMH significant for CKD3, HTN, RA, HLD, GERD, pituitary macroadenoma and headaches who presents as a code stroke for aphasia and mild R sided weakness. He complained of abdominal pain and went to bathroom at [employer] and found leaning against the wall  confused. On arrival to the ED, gaze preference to left nut crosses midline and encephalopathic but concern for aphasia out of proportion to encephalopathy.  Discussed with his wife and given unable to rule out stroke, tnkase was discussed including risks and benefits and alternatives and he was given tnkase.  Etiology of his presentation is unclear. Had to be given ativan to get him through CT head and CT Angio. And required additional haldol to calm him down.  Wife report he got confused back in may over the course of a few days and was diagnosed with diverticulitis.  Recommendations   L gaze preference with concern for Aphasia out of proportion to encephalopathy: Etiology unclear, given tnkase for concern that this could be stroke. - Frequent NeuroChecks for post tNK care per stroke unit protocol: - Initial CTH demonstrated no acute hemorrhage or mass - MRI Brain - pending - CTA - no LVO - TTE - pending. - Lipid Panel: LDL - pending.  - Statin: if LDL > 70 - HbA1c: pending. - Antithrombotic: Start ASA 81 mg daily if 24 h CTH does not show acute hemorrhage - DVT prophylaxis: SCDs. Pharmacologic prophylaxis if 24 h CTH does not demonstrate acute hemorrhage - Systolic Blood Pressure goal: < 180 mm Hg - Telemetry monitoring for arrhythmia: 72 hours - Swallow screen - ordered - PT/OT/SLP consults - CT Abdomen and pelvis. - UA with reflex to microscopy, Ucx, UDS. - CXR, procalcitonin and lactate. - consider seeking assistance from PCCM for further workup for potential infectious etiologies of his confusion.  Hypertension: Hold home home medication.  Gout: Continue home etanercept and allopurinol and prednisone.  GERD: Continue home Protonix:  Hyperlipidemia: LDL pending. Continue home pravastatin.  Insomnia: Continue home trazodone.   ______________________________________________________________________ This patient is critically ill and at significant risk of  neurological worsening, death and care requires constant monitoring of vital signs, hemodynamics,respiratory and cardiac monitoring, neurological assessment, discussion with family, other specialists and medical decision making of high complexity. I spent 40 minutes of neurocritical care time  in the care of  this patient. This was time spent independent of any time provided by nurse practitioner or PA.  Donnetta Simpers [facility] Pager Number [phone] 08/26/2022  1:08 PM  Thank you for the opportunity to take part in the care of this patient. If you have any further questions, please contact the neurology consultation attending.  Signed,  [place] Pager Number [phone] _ _ _   _ __   _ __ _ _  __ __   _ __   __ _

## 2022-08-27 ENCOUNTER — Inpatient Hospital Stay (HOSPITAL_COMMUNITY): Payer: Medicare Other

## 2022-08-27 DIAGNOSIS — R4182 Altered mental status, unspecified: Secondary | ICD-10-CM | POA: Diagnosis not present

## 2022-08-27 DIAGNOSIS — D352 Benign neoplasm of pituitary gland: Secondary | ICD-10-CM | POA: Diagnosis not present

## 2022-08-27 DIAGNOSIS — R410 Disorientation, unspecified: Secondary | ICD-10-CM | POA: Diagnosis not present

## 2022-08-27 DIAGNOSIS — G8929 Other chronic pain: Secondary | ICD-10-CM | POA: Diagnosis not present

## 2022-08-27 DIAGNOSIS — R519 Headache, unspecified: Secondary | ICD-10-CM

## 2022-08-27 LAB — CBC
HCT: 44.1 % (ref 39.0–52.0)
Hemoglobin: 14.7 g/dL (ref 13.0–17.0)
MCH: 30.1 pg (ref 26.0–34.0)
MCHC: 33.3 g/dL (ref 30.0–36.0)
MCV: 90.2 fL (ref 80.0–100.0)
Platelets: 226 10*3/uL (ref 150–400)
RBC: 4.89 MIL/uL (ref 4.22–5.81)
RDW: 14 % (ref 11.5–15.5)
WBC: 14.7 10*3/uL — ABNORMAL HIGH (ref 4.0–10.5)
nRBC: 0 % (ref 0.0–0.2)

## 2022-08-27 LAB — HEMOGLOBIN A1C
Hgb A1c MFr Bld: 5.8 % — ABNORMAL HIGH (ref 4.8–5.6)
Mean Plasma Glucose: 120 mg/dL

## 2022-08-27 LAB — URINALYSIS, COMPLETE (UACMP) WITH MICROSCOPIC
Bacteria, UA: NONE SEEN
Bilirubin Urine: NEGATIVE
Glucose, UA: NEGATIVE mg/dL
Ketones, ur: NEGATIVE mg/dL
Leukocytes,Ua: NEGATIVE
Nitrite: NEGATIVE
Protein, ur: 30 mg/dL — AB
Specific Gravity, Urine: 1.02 (ref 1.005–1.030)
pH: 5 (ref 5.0–8.0)

## 2022-08-27 LAB — LIPID PANEL
Cholesterol: 138 mg/dL (ref 0–200)
HDL: 27 mg/dL — ABNORMAL LOW (ref >40)
LDL Cholesterol: 73 mg/dL (ref 0–99)
Total CHOL/HDL Ratio: 5.1 RATIO
Triglycerides: 188 mg/dL — ABNORMAL HIGH (ref <150)
VLDL: 38 mg/dL (ref 0–40)

## 2022-08-27 LAB — BASIC METABOLIC PANEL
Anion gap: 11 (ref 5–15)
BUN: 28 mg/dL — ABNORMAL HIGH (ref 8–23)
CO2: 22 mmol/L (ref 22–32)
Calcium: 8.8 mg/dL — ABNORMAL LOW (ref 8.9–10.3)
Chloride: 103 mmol/L (ref 98–111)
Creatinine, Ser: 1.8 mg/dL — ABNORMAL HIGH (ref 0.61–1.24)
GFR, Estimated: 38 mL/min — ABNORMAL LOW (ref >60)
Glucose, Bld: 116 mg/dL — ABNORMAL HIGH (ref 70–99)
Potassium: 3.7 mmol/L (ref 3.5–5.1)
Sodium: 136 mmol/L (ref 135–145)

## 2022-08-27 MED ORDER — METOPROLOL TARTRATE 5 MG/5ML IV SOLN
5.0000 mg | Freq: Four times a day (QID) | INTRAVENOUS | Status: DC | PRN
  Administered 2022-08-27 – 2022-08-29 (×4): 5 mg via INTRAVENOUS
  Filled 2022-08-27 (×5): qty 5

## 2022-08-27 MED ORDER — CHLORHEXIDINE GLUCONATE CLOTH 2 % EX PADS
6.0000 | MEDICATED_PAD | Freq: Every day | CUTANEOUS | Status: DC
  Administered 2022-08-27 – 2022-08-31 (×5): 6 via TOPICAL

## 2022-08-27 NOTE — Evaluation (Signed)
Clinical/Bedside Swallow Evaluation Patient Details  Name: [name] MRN: [id number] Date of Birth: [birth year]  Today's Date: 08/27/2022 Time: SLP Start Time (ACUTE ONLY): 2951 SLP Stop Time (ACUTE ONLY): 1002 SLP Time Calculation (min) (ACUTE ONLY): 13 min  Past Medical History:  Past Medical History:  Diagnosis Date   Anxiety and depression    Chronic renal insufficiency, stage 3 (moderate) (HCC)    DDD (degenerative disc disease), lumbar    remote hx of fusion surgery   GERD (gastroesophageal reflux disease)    Gout    History of stomach ulcers 2000   Hyperlipidemia    Hypertension    Rheumatoid arthritis ([place])    Rheumatoid    Past Surgical History:  Past Surgical History:  Procedure Laterality Date   APPENDECTOMY  1968   back fusion  2000   CHOLECYSTECTOMY N/A 06/06/2017   Procedure: LAPAROSCOPIC CHOLECYSTECTOMY WITH INTRAOPERATIVE CHOLANGIOGRAM;  Surgeon: [name], MD;  Location: [facility];  Service: General;  Laterality: N/A;   EYE SURGERY     Dr. [name]  lens implants   LUMBAR LAMINECTOMY  [place]   with titanium rods   UMBILICAL HERNIA REPAIR N/A 06/06/2017   Procedure: UMBILICAL HERNIA REPAIR;  Surgeon: [name], MD;  Location: [facility];  Service: General;  Laterality: N/A;   HPI:  [name] is a 76 y.o. male who presented as a code stroke for aphasia and mild R sided weakness.  Head CT and CXR 12/22 with no acute findings.  MRI pending.  Pt with PMH significant for CKD3, HTN, RA, HLD, GERD, pituitary macroadenoma and headaches.    Assessment / Plan / Recommendation  Clinical Impression  Pt presents with a mild oral dysphagia.  His wife reports his upper denture plate is new and he is still getting used to it.  He has had some baseline difficulty with chewing because of this.  Pt exhibited difficulty following commands for OME, it is unclear if this is an oral apraxia, receptive language deficit, or combination of  both.  Full speech-language evaluation pending.  Today pt tolerated all consistencies trialed, including serial straw sips of thin liquid, with no clinical s/s of aspiration.  There was mild diffuse oral residue with regular solids after slightly prolonged oral phase.  Wife in agreement with mechanical soft solids for ease of mastication. MRI is pending.  Should MRI be concerning for increased risk of silent aspiration, consider MBS at that time.    Recommend mechanical soft diet with thin liquids.  SLP Visit Diagnosis: Dysphagia, oral phase (R13.11)    Aspiration Risk  No limitations    Diet Recommendation Dysphagia 3 (Mech soft);Thin liquid   Liquid Administration via: Cup;Straw Medication Administration:  (As tolerated. Pt with some confusion, crush if needed.) Supervision: Staff to assist with self feeding;Full supervision/cueing for compensatory strategies Compensations: Slow rate;Small sips/bites Postural Changes: Seated upright at 90 degrees    Other  Recommendations Oral Care Recommendations: Oral care BID    Recommendations for follow up therapy are one component of a multi-disciplinary discharge planning process, led by the attending physician.  Recommendations may be updated based on patient status, additional functional criteria and insurance authorization.  Follow up Recommendations  (Likely not to need follow up for swallowing; SLE pending.)      Assistance Recommended at Discharge    Functional Status Assessment Patient has had a recent decline in their functional status and demonstrates the ability to make significant  improvements in function in a reasonable and predictable amount of time.  Frequency and Duration min 2x/week  2 weeks       Prognosis Prognosis for Safe Diet Advancement: Fair (dental status may limit ability to advance solid textures.) Barriers/Prognosis Comment: dental status may limit ability to advance solid textures.      Swallow Study    General Date of Onset: 08/26/22 HPI: [name] is a 76 y.o. male who presented as a code stroke for aphasia and mild R sided weakness.  Head CT and CXR 12/22 with no acute findings.  MRI pending.  Pt with PMH significant for CKD3, HTN, RA, HLD, GERD, pituitary macroadenoma and headaches. Type of Study: Bedside Swallow Evaluation Previous Swallow Assessment: none Diet Prior to this Study: NPO Temperature Spikes Noted: No Respiratory Status: Nasal cannula History of Recent Intubation: No Behavior/Cognition: Alert;Cooperative;Confused;Requires cueing Oral Cavity Assessment: Within Functional Limits Oral Care Completed by SLP: Yes Oral Cavity - Dentition: Dentures, top (bottom partial, not in place) Self-Feeding Abilities: Needs assist Patient Positioning: Upright in bed Baseline Vocal Quality: Normal Volitional Cough:  (Fair) Volitional Swallow: Unable to elicit    Oral/Motor/Sensory Function Overall Oral Motor/Sensory Function: Mild impairment Facial ROM: Reduced right;Reduced left Facial Symmetry: Within Functional Limits Lingual ROM: Reduced right;Reduced left (reduced protrusion) Lingual Symmetry: Within Functional Limits Lingual Strength: Reduced Velum: Within Functional Limits Mandible: Within Functional Limits   Ice Chips Ice chips: Within functional limits Presentation: Spoon   Thin Liquid Thin Liquid: Within functional limits Presentation: Spoon;Straw    Nectar Thick Nectar Thick Liquid: Not tested   Honey Thick Honey Thick Liquid: Not tested   Puree Puree: Within functional limits Presentation: Spoon   Solid     Solid: Impaired Presentation:  (SLP fed) Oral Phase Functional Implications: Oral residue      Christian Savage, MA, [place] Office: [phone] 08/27/2022,10:37 AM

## 2022-08-27 NOTE — Evaluation (Signed)
Physical Therapy Evaluation Patient Details Name: [name] MRN: [id number] DOB: 1946-07-17 Today's Date: 08/27/2022  History of Present Illness  76 y.o. male admitted 08/26/22 with aphasia and R side weakness. MRI pending. CT (-). 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   Clinical Impression  Pt presents with condition above and deficits mentioned below, see PT Problem List. PTA, he was independent without DME and living with his wife in a 1-level house with 3-4 STE. Currently, pt is very lethargic after receiving ativan for his MRI, thus following few cues and keeping eyes closed. Pt required maxAx2 for bed mobility and to partially stand at EOB. Pt is able to move all 4 extremities at least partially against gravity. He demonstrates deficits in gross overall strength, static and dynamic balance, power, cognition, and activity tolerance. At this time recommending AIR to maximize his return to baseline, but will continue to follow acutely and assess post-acute PT needs pending his progress.        Recommendations for follow up therapy are one component of a multi-disciplinary discharge planning process, led by the attending physician.  Recommendations may be updated based on patient status, additional functional criteria and insurance authorization.  Follow Up Recommendations Acute inpatient rehab (3hours/day)      Assistance Recommended at Discharge Frequent or constant Supervision/Assistance  Patient can return home with the following  Two people to help with walking and/or transfers;A lot of help with bathing/dressing/bathroom;Assistance with cooking/housework;Direct supervision/assist for medications management;Direct supervision/assist for financial management;Assist for transportation;Help with stairs or ramp for entrance    Equipment Recommendations Other (comment) (TBA)  Recommendations for Other Services   Rehab consult    Functional Status Assessment Patient has had a recent decline in their functional status and demonstrates the ability to make significant improvements in function in a reasonable and predictable amount of time.     Precautions / Restrictions Precautions Precautions: Fall Precaution Comments: bil mittens, posey belt, SBP < 180 Restrictions Weight Bearing Restrictions: No      Mobility  Bed Mobility Overal bed mobility: Needs Assistance Bed Mobility: Supine to Sit, Sit to Supine, Rolling Rolling: +2 for physical assistance, Max assist   Supine to sit: +2 for physical assistance, Max assist Sit to supine: +2 for physical assistance, Max assist   General bed mobility comments: Cues provided for bed mobility with poor initiation by pt    Transfers Overall transfer level: Needs assistance Equipment used: None Transfers: Sit to/from Stand Sit to Stand: +2 physical assistance, Max assist           General transfer comment: pad used and counting with anterior rocking momentum. pt initiated task but did not sustain    Ambulation/Gait               General Gait Details: unable  Stairs            Wheelchair Mobility    Modified Rankin (Stroke Patients Only)       Balance Overall balance assessment: Needs assistance Sitting-balance support: No upper extremity supported, Feet supported Sitting balance-Leahy Scale: Poor Sitting balance - Comments: ModA majority of time for static sitting balance, minguard assist briefly intermittently Postural control: Posterior lean Standing balance support: No upper extremity supported, During functional activity, Reliant on assistive device for balance Standing balance-Leahy Scale: Zero Standing balance comment: MaxAx2 to stand using pad under buttocks to support  Pertinent Vitals/Pain Pain Assessment Pain Assessment: No/denies pain    Home Living Family/patient  expects to be discharged to:: Private residence Living Arrangements: Spouse/significant other Available Help at Discharge: Family;Available PRN/intermittently Type of Home: House Home Access: Stairs to enter Entrance Stairs-Rails: Right Entrance Stairs-Number of Steps: 3-4   Home Layout: One level Home Equipment: Conservation officer, nature (2 wheels);Cane - single point;Shower seat Additional Comments: spouse works but pts son and pt wife mother ( mother in Sports coach) plan to assist as needed    Prior Function Prior Level of Function : Independent/Modified Independent;Driving             Mobility Comments: Hydrographic surveyor without AD, driving ADLs Comments: Independent     Hand Dominance   Dominant Hand: Right    Extremity/Trunk Assessment   Upper Extremity Assessment Upper Extremity Assessment: Defer to OT evaluation    Lower Extremity Assessment Lower Extremity Assessment: RLE deficits/detail;LLE deficits/detail;Generalized weakness;Difficult to assess due to impaired cognition RLE Deficits / Details: observed initiation of extension of knee in sitting and wiggled toes, active resistance noted when providing stretch into knee extension LLE Deficits / Details: observed initiation of extension of knee in sitting and wiggled toes, active resistance noted when providing stretch into knee extension    Cervical / Trunk Assessment Cervical / Trunk Assessment: Normal  Communication   Communication: Other (comment) (wife reports sometimes HOH but normally hears fine)  Cognition Arousal/Alertness: Lethargic, Suspect due to medications Behavior During Therapy: Flat affect Overall Cognitive Status: Difficult to assess                                 General Comments: pt s/p ativan for MRI and very drowsy, keeping eyes closed        General Comments General comments (skin integrity, edema, etc.): HR max 130 during session, RA    Exercises     Assessment/Plan    PT  Assessment Patient needs continued PT services  PT Problem List Decreased strength;Decreased activity tolerance;Decreased balance;Decreased mobility;Decreased cognition;Decreased safety awareness       PT Treatment Interventions DME instruction;Gait training;Stair training;Functional mobility training;Therapeutic activities;Therapeutic exercise;Balance training;Neuromuscular re-education;Cognitive remediation;Patient/family education    PT Goals (Current goals can be found in the Care Plan section)  Acute Rehab PT Goals Patient Stated Goal: wife wishes for pt to improve; pt did not state PT Goal Formulation: With patient/family Time For Goal Achievement: 09/10/22 Potential to Achieve Goals: Good    Frequency Min 4X/week     Co-evaluation PT/OT/SLP Co-Evaluation/Treatment: Yes Reason for Co-Treatment: Complexity of the patient's impairments (multi-system involvement);Necessary to address cognition/behavior during functional activity;For patient/therapist safety;To address functional/ADL transfers PT goals addressed during session: Mobility/safety with mobility;Balance         AM-PAC PT "6 Clicks" Mobility  Outcome Measure Help needed turning from your back to your side while in a flat bed without using bedrails?: Total Help needed moving from lying on your back to sitting on the side of a flat bed without using bedrails?: Total Help needed moving to and from a bed to a chair (including a wheelchair)?: Total Help needed standing up from a chair using your arms (e.g., wheelchair or bedside chair)?: Total Help needed to walk in hospital room?: Total Help needed climbing 3-5 steps with a railing? : Total 6 Click Score: 6    End of Session   Activity Tolerance: Patient limited by lethargy Patient left: in bed;with call bell/phone within  reach;with bed alarm set;with restraints reapplied;with family/visitor present Nurse Communication: Mobility status PT Visit Diagnosis:  Unsteadiness on feet (R26.81);Muscle weakness (generalized) (M62.81);Difficulty in walking, not elsewhere classified (R26.2)    Time: 9090-3014 PT Time Calculation (min) (ACUTE ONLY): 21 min   Charges:   PT Evaluation $PT Eval Moderate Complexity: 1 Mod          Moishe Spice, PT, DPT Acute Rehabilitation Services  Office: [phone]   Orvan Falconer 08/27/2022, 3:59 PM

## 2022-08-27 NOTE — Progress Notes (Signed)
Pt is currently in MRI will try back as schedule permits

## 2022-08-27 NOTE — Procedures (Signed)
Routine EEG Report  [name] is a 76 y.o. male with a history of altered mental status who is undergoing an EEG to evaluate for seizures.  Report: This EEG was acquired with electrodes placed according to the International 10-20 electrode system (including Fp1, Fp2, F3, F4, C3, C4, P3, P4, O1, O2, T3, T4, T5, T6, A1, A2, Fz, Cz, Pz). The following electrodes were missing or displaced: none.  The occipital dominant rhythm was 7-8 Hz. This activity is reactive to stimulation. Drowsiness was manifested by background fragmentation; deeper stages of sleep were not identified. There was no focal slowing. There were no interictal epileptiform discharges. There were no electrographic seizures identified. Photic stimulation and hyperventilation were not performed.   Impression and clinical correlation: This EEG was obtained while awake and drowsy and is abnormal due to mild diffuse slowing indicative of global cerebral dysfunction. Epileptiform abnormalities were not seen during this recording.  Su Monks, MD [facility] [phone]  If 7pm- 7am, please page neurology on call as listed in [place].

## 2022-08-27 NOTE — Progress Notes (Signed)
We attempted to get patient onto the scanner table and they were trying to roll over and almost rolled off the table. Had to immediately move them back to their bed. Very unsafe.

## 2022-08-27 NOTE — Progress Notes (Addendum)
STROKE TEAM PROGRESS NOTE   ATTENDING NOTE: I reviewed above note and agree with the assessment and plan. Pt was seen and examined.   76 year old male with history of hypertension, hyperlipidemia, CKD 3, rheumatoid arthritis, headache, pituitary macroadenoma admitted for right-sided weakness, aphasia, shivering, left gaze, confusion.  CT no acute abnormality except pituitary macroadenoma.  Status post TNK.  CT head and neck negative.  Patient was agitated in ED, needed Ativan and Haldol for imaging studies.  Not able to have MRI done due to agitation.  Repeat CT no acute abnormality.  EF 60 to 65%.  LDL 73, A1c 5.8.  UDS negative.  Creatinine 1.81-1.70-1.80.  WBC 12.8->14.7.  Patient had spiking fever 103 this afternoon.  Received Tylenol.  Blood culture pending.  UA negative.  CXR negative.  EEG mild encephalopathy, no seizure.  Per wife, patient since February had relenting headache.  In May, he was seen by Dr. 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 Arnoldo Morale neurosurgery, plan for surgical resection next months.  He was put on tramadol, however not effective.  Per wife, patient suffering from headache and on daily basis and yesterday he was found in bathroom in [place], holding head, not able to talk with difficulty walking.  On exam, wife at bedside.  Patient drowsy sleepy, eyes open on voice, orientated to place and people but not to age or time. No aphasia, paucity of speech but able to say "I don't know", following most simple commands. Able to name 2/3 and repeat simple 3-word sentence. No gaze palsy, tracking bilaterally, blinking to visual threat bilaterally. No facial droop. Tongue midline. Bilateral UEs 4/5 and bilaterally LEs 3/5. Sensation, coordination not cooperative and gait not tested. Spontaneously shaking legs mildly bilaterally, but not involuntary movement.   Etiology for patient symptoms not quite clear.  Concerning for seizure activity with postictal  confusion and agitation.  DDx including complicated migraine and TIA.  Pending MRI, pending blood culture.  Hold off antibiotic at this time.  Continue ICU monitoring.  Patient currently no headache, will consider Depakote if headache recurs.  For detailed assessment and plan, please refer to above/below as I have made changes wherever appropriate.   Rosalin Hawking, MD PhD Stroke Neurology 08/27/2022 10:22 PM  This patient is critically ill due to strokelike symptoms status post TNK, agitation needing sedation, spiking fever and at significant risk of neurological worsening, death form bleeding from TNK, encephalopathy, sepsis, status epilepticus. This patient's care requires constant monitoring of vital signs, hemodynamics, respiratory and cardiac monitoring, review of multiple databases, neurological assessment, discussion with family, other specialists and medical decision making of high complexity. I spent 40 minutes of neurocritical care time in the care of this patient. I had long discussion with wife at bedside, updated pt current condition, treatment plan and potential prognosis, and answered all the questions.  She expressed understanding and appreciation.      INTERVAL HISTORY Patient seen at bedside with wife present in room. He is somnolent but alert and oriented x3. He has notable mild anomic aphasia and generalized weakness throughout that is non-focal.  Vitals:   08/27/22 0600 08/27/22 0618 08/27/22 0619 08/27/22 0700  BP: (!) 132/118 (!) 147/95 (!) 147/95 (!) 149/92  Pulse: (!) 114 (!) 104  (!) 117  Resp: (!) 26 (!) 23  17  Temp:      TempSrc:      SpO2: 94% 94%  94%   CBC:  Recent Labs  Lab 08/26/22  1004 08/26/22 1009  WBC 12.8*  --   NEUTROABS 8.4*  --   HGB 12.0* 13.9  HCT 35.0* 41.0  MCV 90.4  --   PLT 182  --    Basic Metabolic Panel:  Recent Labs  Lab 08/26/22 1004 08/26/22 1009  NA 138 140  K 4.0 4.1  CL 104 104  CO2 24  --   GLUCOSE 143* 140*  BUN  29* 37*  CREATININE 1.81* 1.70*  CALCIUM 8.4*  --    Lipid Panel:  Recent Labs  Lab 08/27/22 0311  CHOL 138  TRIG 188*  HDL 27*  CHOLHDL 5.1  VLDL 38  LDLCALC 73   HgbA1c:  Recent Labs  Lab 08/26/22 1646  HGBA1C 5.8*   Urine Drug Screen:  Recent Labs  Lab 08/26/22 1002  LABOPIA NONE DETECTED  COCAINSCRNUR NONE DETECTED  LABBENZ NONE DETECTED  AMPHETMU NONE DETECTED  THCU NONE DETECTED  LABBARB NONE DETECTED    Alcohol Level  Recent Labs  Lab 08/26/22 1004  [place] <10    IMAGING past 24 hours ECHOCARDIOGRAM COMPLETE  Result Date: 08/26/2022    ECHOCARDIOGRAM REPORT   Patient Name:   [name] Date of Exam: 08/26/2022 Medical Rec #:  [id number]       Height:       68.0 in Accession #:    [id number]      Weight:       158.3 lb Date of Birth:  March 21, 1946        BSA:          1.850 m Patient Age:    26 years        BP:           152/79 mmHg Patient Gender: M               HR:           108 bpm. Exam Location:  Inpatient Procedure: 2D Echo, Cardiac Doppler and Color Doppler Indications:    Stroke I63.9  History:        Patient has no prior history of Echocardiogram examinations.                 Stroke; Risk Factors:Hypertension and Dyslipidemia. CKD, stage                 III.  Sonographer:    Ronny Flurry Referring Phys: Amie Portland  Sonographer Comments: Image acquisition challenging due to uncooperative patient. IMPRESSIONS  1. Left ventricular ejection fraction, by estimation, is 60 to 65%. The left ventricle has normal function. The left ventricle has no regional wall motion abnormalities. Indeterminate diastolic filling due to E-A fusion.  2. Right ventricular systolic function is normal. The right ventricular size is normal.  3. No evidence of mitral valve regurgitation.  4. The aortic valve was not well visualized. Aortic valve regurgitation is not visualized.  5. The inferior vena cava is normal in size with greater than 50% respiratory variability, suggesting  right atrial pressure of 3 mmHg. Comparison(s): No prior Echocardiogram. FINDINGS  Left Ventricle: Left ventricular ejection fraction, by estimation, is 60 to 65%. The left ventricle has normal function. The left ventricle has no regional wall motion abnormalities. The left ventricular internal cavity size was normal in size. There is  no left ventricular hypertrophy. Indeterminate diastolic filling due to E-A fusion. Right Ventricle: The right ventricular size is normal. Right ventricular systolic function is normal. Left Atrium: Left atrial size was  normal in size. Right Atrium: Right atrial size was normal in size. Pericardium: There is no evidence of pericardial effusion. Mitral Valve: No evidence of mitral valve regurgitation. Tricuspid Valve: Tricuspid valve regurgitation is not demonstrated. Aortic Valve: The aortic valve was not well visualized. Aortic valve regurgitation is not visualized. Aortic valve mean gradient measures 14.0 mmHg. Aortic valve peak gradient measures 26.6 mmHg. Aortic valve area, by VTI measures 1.57 cm. Pulmonic Valve: Pulmonic valve regurgitation is not visualized. Aorta: The aortic root and ascending aorta are structurally normal, with no evidence of dilitation. Venous: The inferior vena cava is normal in size with greater than 50% respiratory variability, suggesting right atrial pressure of 3 mmHg. IAS/Shunts: The interatrial septum was not well visualized.  LEFT VENTRICLE PLAX 2D LVOT diam:     2.10 cm   Diastology LV SV:         48        LV e' lateral:   11.00 cm/s LV SV Index:   26        LV E/e' lateral: 5.6 LVOT Area:     3.46 cm  RIGHT VENTRICLE RV S prime:     14.70 cm/s TAPSE (M-mode): 1.7 cm AORTIC VALVE AV Area (Vmax):    1.31 cm AV Area (Vmean):   1.29 cm AV Area (VTI):     1.57 cm AV Vmax:           258.00 cm/s AV Vmean:          169.000 cm/s AV VTI:            0.308 m AV Peak Grad:      26.6 mmHg AV Mean Grad:      14.0 mmHg LVOT Vmax:         97.65 cm/s LVOT  Vmean:        63.000 cm/s LVOT VTI:          0.140 m LVOT/AV VTI ratio: 0.45  AORTA Ao Root diam: 3.70 cm Ao Asc diam:  3.50 cm MITRAL VALVE MV Area (PHT): 4.80 cm     SHUNTS MV Decel Time: 158 msec     Systemic VTI:  0.14 m MV E velocity: 61.70 cm/s   Systemic Diam: 2.10 cm MV A velocity: 110.00 cm/s MV E/A ratio:  0.56 Mary Scientist, physiological signed by Phineas Inches Signature Date/Time: 08/26/2022/4:09:27 PM    Final    CT ABDOMEN PELVIS WO CONTRAST  Result Date: 08/26/2022 CLINICAL DATA:  Acute generalized abdominal pain. EXAM: CT ABDOMEN AND PELVIS WITHOUT CONTRAST TECHNIQUE: Multidetector CT imaging of the abdomen and pelvis was performed following the standard protocol without IV contrast. RADIATION DOSE REDUCTION: This exam was performed according to the departmental dose-optimization program which includes automated exposure control, adjustment of the mA and/or kV according to patient size and/or use of iterative reconstruction technique. COMPARISON:  Jan 09, 2022. FINDINGS: Lower chest: No acute abnormality. Hepatobiliary: No focal liver abnormality is seen. Status post cholecystectomy. No biliary dilatation. Pancreas: Unremarkable. No pancreatic ductal dilatation or surrounding inflammatory changes. Spleen: Normal in size without focal abnormality. Adrenals/Urinary Tract: Adrenal glands appear normal. Bilateral renal cysts are noted for which no further follow-up is required. No hydronephrosis or renal obstruction is noted. Urinary bladder is unremarkable. Stomach/Bowel: The stomach appears normal. Status post appendectomy. There is no evidence of bowel obstruction or inflammation. Sigmoid diverticulosis is noted without inflammation. Vascular/Lymphatic: Aortic atherosclerosis. No enlarged abdominal or pelvic lymph nodes. Reproductive: Prostate is unremarkable. Other: No abdominal  wall hernia or abnormality. No abdominopelvic ascites. Musculoskeletal: No acute or significant osseous findings.  IMPRESSION: Sigmoid diverticulosis without inflammation. No acute abnormality seen in the abdomen or pelvis. Aortic Atherosclerosis (ICD10-I70.0). Electronically Signed   By: Marijo Conception M.D.   On: 08/26/2022 12:58   DG Chest Portable 1 View  Result Date: 08/26/2022 CLINICAL DATA:  Tachypnea EXAM: PORTABLE CHEST 1 VIEW COMPARISON:  Chest x-ray Jan 09, 2022. FINDINGS: The heart size and mediastinal contours are within normal limits. Both lungs are clear. No visible pleural effusions or pneumothorax. No acute osseous abnormality. IMPRESSION: No active disease. Electronically Signed   By: Margaretha Sheffield M.D.   On: 08/26/2022 11:58   CT ANGIO HEAD NECK W WO CM  Result Date: 08/26/2022 CLINICAL DATA:  Stroke suspected. EXAM: CT ANGIOGRAPHY HEAD AND NECK TECHNIQUE: Multidetector CT imaging of the head and neck was performed using the standard protocol during bolus administration of intravenous contrast. Multiplanar CT image reconstructions and MIPs were obtained to evaluate the vascular anatomy. Carotid stenosis measurements (when applicable) are obtained utilizing NASCET criteria, using the distal internal carotid diameter as the denominator. RADIATION DOSE REDUCTION: This exam was performed according to the departmental dose-optimization program which includes automated exposure control, adjustment of the mA and/or kV according to patient size and/or use of iterative reconstruction technique. CONTRAST:  25m OMNIPAQUE IOHEXOL 350 MG/ML SOLN COMPARISON:  None Available. FINDINGS AND IMPRESSION: Please see same day prior CTA head/neck angiogram dictation. Prior dictation, which was time stamped at 10:11:35 AM was based on interpretation for the images available on this accession ((#[id number]). Electronically Signed   By: HMarin RobertsM.D.   On: 08/26/2022 11:04   CT ANGIO HEAD NECK W WO CM (CODE STROKE)  Result Date: 08/26/2022 CLINICAL DATA:  Stroke suspected. Aphasia EXAM: CT HEAD WITHOUT  CONTRAST CT ANGIOGRAPHY OF THE HEAD AND NECK TECHNIQUE: Contiguous axial images were obtained from the base of the skull through the vertex without intravenous contrast. Multidetector CT imaging of the head and neck was performed using the standard protocol during bolus administration of intravenous contrast. Multiplanar CT image reconstructions and MIPs were obtained to evaluate the vascular anatomy. Carotid stenosis measurements (when applicable) are obtained utilizing NASCET criteria, using the distal internal carotid diameter as the denominator. RADIATION DOSE REDUCTION: This exam was performed according to the departmental dose-optimization program which includes automated exposure control, adjustment of the mA and/or kV according to patient size and/or use of iterative reconstruction technique. CONTRAST:  1568mOMNIPAQUE IOHEXOL 350 MG/ML SOLN COMPARISON:  MRI Brain 04/06/22 FINDINGS: CT HEAD Brain: Sequela of severe chronic microvascular ischemic change. Chronic mineralization of the right basal ganglia. There is no CT evidence of an acute infarct. No hemorrhage. No hydrocephalus. Size and shape of the ventricular system is unchanged compared to prior exam. No extra-axial fluid collection. 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 Vascular: No hyperdense vessel.  See below for additional findings. Skull: Normal. Negative for fracture or focal lesion. Sinuses/Orbits: No acute finding.  Bilateral lens replacement. CTA NECK Aortic arch: There is soft atherosclerotic plaque, most notably after the takeoff of the left subclavian artery. No dissection. There is soft atherosclerotic plaque in the proximal left subclavian artery resulting in mild stenosis. Right carotid system: No hemodynamically significant stenosis, dissection, or aneurysm. Left carotid system: No hemodynamically  significant stenosis, dissection, or aneurysm. Vertebral arteries:The  origin of the left vertebral artery is poorly assessed due to the presence of dense calcified atherosclerotic plaque in this region. No hemodynamically significant stenosis, dissection, or aneurysm. Skeleton: Negative Other neck: Negative CTA HEAD Anterior circulation: No aneurysm, stenosis, or occlusion. Posterior circulation:  No aneurysm, stenosis, or occlusion. Venous sinuses: Patent when accounting for timing of contrast bolus. Anatomic variants: None IMPRESSION: 1. No acute intracranial abnormality. 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 2. No emergent large vessel occlusion. 3. No hemodynamically significant stenosis in the neck. 4. Redemonstrated pituitary macroadenoma, which invades the right cavernous sinus. Findings were discussed with Dr. Lorrin Goodell on 08/26/22 at 10:41 AM. Aortic Atherosclerosis (ICD10-I70.0). Electronically Signed   By: Marin Roberts M.D.   On: 08/26/2022 10:57   CT HEAD WO CONTRAST (5MM)  Result Date: 08/26/2022 CLINICAL DATA:  Stroke suspected. Aphasia EXAM: CT HEAD WITHOUT CONTRAST CT ANGIOGRAPHY OF THE HEAD AND NECK TECHNIQUE: Contiguous axial images were obtained from the base of the skull through the vertex without intravenous contrast. Multidetector CT imaging of the head and neck was performed using the standard protocol during bolus administration of intravenous contrast. Multiplanar CT image reconstructions and MIPs were obtained to evaluate the vascular anatomy. Carotid stenosis measurements (when applicable) are obtained utilizing NASCET criteria, using the distal internal carotid diameter as the denominator. RADIATION DOSE REDUCTION: This exam was performed according to the departmental dose-optimization program which includes automated exposure control, adjustment of the mA and/or kV according to patient size and/or use of iterative reconstruction technique. CONTRAST:  118m  OMNIPAQUE IOHEXOL 350 MG/ML SOLN COMPARISON:  MRI Brain 04/06/22 FINDINGS: CT HEAD Brain: Sequela of severe chronic microvascular ischemic change. Chronic mineralization of the right basal ganglia. There is no CT evidence of an acute infarct. No hemorrhage. No hydrocephalus. Size and shape of the ventricular system is unchanged compared to prior exam. No extra-axial fluid collection. 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 Vascular: No hyperdense vessel.  See below for additional findings. Skull: Normal. Negative for fracture or focal lesion. Sinuses/Orbits: No acute finding.  Bilateral lens replacement. CTA NECK Aortic arch: There is soft atherosclerotic plaque, most notably after the takeoff of the left subclavian artery. No dissection. There is soft atherosclerotic plaque in the proximal left subclavian artery resulting in mild stenosis. Right carotid system: No hemodynamically significant stenosis, dissection, or aneurysm. Left carotid system: No hemodynamically significant stenosis, dissection, or aneurysm. Vertebral arteries:The origin of the left vertebral artery is poorly assessed due to the presence of dense calcified atherosclerotic plaque in this region. No hemodynamically significant stenosis, dissection, or aneurysm. Skeleton: Negative Other neck: Negative CTA HEAD Anterior circulation: No aneurysm, stenosis, or occlusion. Posterior circulation:  No aneurysm, stenosis, or occlusion. Venous sinuses: Patent when accounting for timing of contrast bolus. Anatomic variants: None IMPRESSION: 1. No acute intracranial abnormality. 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 2. No emergent large vessel occlusion. 3. No hemodynamically significant stenosis in the neck. 4. Redemonstrated pituitary macroadenoma, which invades the right cavernous sinus. Findings were  discussed with Dr. KLorrin Goodellon 08/26/22 at 10:41 AM. Aortic Atherosclerosis (ICD10-I70.0). Electronically Signed   By: HMarin RobertsM.D.   On: 08/26/2022 10:57   CT HEAD CODE STROKE WO CONTRAST  Result Date: 08/26/2022 CLINICAL DATA:  Code stroke. Neuro deficit, acute, stroke suspected. EXAM: CT HEAD WITHOUT CONTRAST TECHNIQUE: Contiguous axial images were obtained from the  base of the skull through the vertex without intravenous contrast. RADIATION DOSE REDUCTION: This exam was performed according to the departmental dose-optimization program which includes automated exposure control, adjustment of the mA and/or kV according to patient size and/or use of iterative reconstruction technique. COMPARISON:  Brain MRI 04/06/2022.  Head CT 01/09/2022. FINDINGS: Brain: Mild generalized cerebral atrophy. Advanced patchy and ill-defined hypoattenuation within the cerebral white matter, nonspecific but compatible with chronic small vessel disease. Known pituitary macroadenoma, incompletely assessed on this non-contrast head CT, but grossly unchanged in size from the prior brain MRI of 04/06/2022. There is no acute intracranial hemorrhage. No acute demarcated cortical infarct. No extra-axial fluid collection. No midline shift. Vascular: No hyperdense vessel. Atherosclerotic calcifications. Skull: No fracture or aggressive osseous lesion. Sinuses/Orbits: No mass or acute finding within the imaged orbits. Mild mucosal thickening within the bilateral ethmoid and sphenoid sinuses. ASPECTS [facility] Stroke Program Early CT Score) - Ganglionic level infarction (caudate, lentiform nuclei, internal capsule, insula, M1-M3 cortex): 7 - Supraganglionic infarction (M4-M6 cortex): 3 Total score (0-10 with 10 being normal): 10 No evidence of acute intracranial hemorrhage or acute infarct. These results were called by telephone at the time of interpretation on 08/26/2022 at 10:15 am to provider Dr. Lorrin Goodell, who verbally  acknowledged these results. IMPRESSION: 1. No evidence of acute intracranial hemorrhage or acute infarct. 2. Known pituitary macroadenoma, incompletely assessed on this non-contrast head CT, but grossly unchanged from the prior brain MRI of 04/06/2022. 3. Advanced chronic small vessel image changes within the cerebral white matter. 4. Mild generalized cerebral atrophy. Electronically Signed   By: Kellie Simmering D.O.   On: 08/26/2022 10:36    PHYSICAL EXAM General: in no acute distress HEENT: normocephalic and atraumatic Cardiovascular: tachycardic Respiratory: normal respiratory effort and on RA Gastrointestinal: non-tender and non-distended Extremities: moving all extremities spontaneously  Mental Status: [name] is somnolent; he is oriented to person, oriented to place, and oriented to situation. Speech was clear and fluent with evidence of anomic aphasia. He was able to follow only simple commands.  Cranial Nerves: II:  Visual fields grossly normal III,IV, VI: no ptosis, extra-ocular motions intact bilaterally V,VII: smile unable to be assessed, facial light touch sensation intact bilaterally XII: midline tongue extension without atrophy and without fasciculations  Motor: Right : Upper extremity   4/5 full range of motion against gravity and offers some resistance  Lower extremity   4/5 full range of motion against gravity and offers some resistance  Left: Upper extremity   3/5 full range of motion against gravity Lower extremity   3/5 full range of motion against gravity  Tone and bulk: normal tone throughout; no atrophy noted  Sensory: sensation to light touch intact throughout bilaterally  Gait: not observed during encounter  ASSESSMENT/PLAN [name] is a 76 y.o. male with PMH significant for CKD3, HTN, RA, HLD, GERD, pituitary macroadenoma and headaches who presents as a code stroke for aphasia and mild R sided weakness. He complained of abdominal pain and went  to bathroom at [employer] and found leaning against the wall confused. Given TNK on admission. Presentation c/f stroke vs post-ictal state.  #AMS #Stroke vs post-ictal state Code Stroke CT Head No evidence of acute intracranial hemorrhage or acute infarct. Known pituitary macroadenoma, incompletely assessed on this non-contrast head CT, but grossly unchanged from the prior brain MRI of 04/06/2022.Advanced chronic small vessel image changes within the cerebral white matter. Mild generalized cerebral atrophy. ASPECTS 10. CTA head & neck shows no  emergent large vessel occlusion. No hemodynamically significant stenosis in the neck. Redemonstrated pituitary macroadenoma, which invades the right cavernous sinus. MRI pending 2D Echo EF 60-65% LDL 73 HgbA1c 5.8 VTE prophylaxis - SCDs    Diet   Diet NPO time specified   No antithrombotic prior to admission, now on No antithrombotic. DAPT pending MRI EEG pending Therapy recommendations:  pending evaluation Disposition:  pending therapy recs, workup  Hypertension Home meds:  amlodipine, losartan. Held on admission. Clevidipine stopped Stable Permissive hypertension (OK if < 220/120) but gradually normalize in 5-7 days Long-term BP goal normotensive  Hyperlipidemia Home meds: pravastatin 40 mg, held hospital LDL 73, goal < 70 Increase pravastatin from 40 mg to 60 mg given LDL close to goal pending no bleeding High intensity statin not indicated given close to goal. Continue statin at discharge  Prediabetes Home meds:  none HgbA1c 5.8, goal < 7.0 CBGs Recent Labs    08/26/22 1001  GLUCAP 165*    SSI  Leukocytosis WBC 12.8. Afebrile. - continue to trend WBC, fever curve  Other Stroke Risk Factors Advanced Age >/= 68   Other Active Problems Gout Continue home etanercept and allopurinol and prednisone. GERD: Continue home pantoprazole: Insomnia Continue home trazodone. Anxiety and depression Continue  escitalopram  Hospital day # 1  To contact Stroke Continuity provider, please refer to [email or web address] After hours, contact General Neurology

## 2022-08-27 NOTE — Progress Notes (Signed)
Notified Dr. Erlinda Hong that there was no current order for ASA. Dr. Erlinda Hong confirmed that he will reassess for the need of ASA tomorrow.

## 2022-08-27 NOTE — Progress Notes (Signed)
EEG complete - results pending 

## 2022-08-27 NOTE — Progress Notes (Signed)
PT Cancellation Note  Patient Details Name: MEAD SLANE MRN: [id number] DOB: 06-Apr-1946   Cancelled Treatment:    Reason Eval/Treat Not Completed: Active bedrest order. Will plan to follow-up later as time permits and if activity orders progress.   Moishe Spice, PT, DPT Acute Rehabilitation Services  Office: [place] 08/27/2022, 7:45 AM

## 2022-08-27 NOTE — Evaluation (Signed)
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   Clinical Impression   PT admitted with R side weakness with pending MRI workup. Pt currently with functional limitiations due to the deficits listed below (see OT problem list). Pt at baseline indep with all adls and driving. Pt provided ativan prior to MRI and very drowsy at this time. Wife present to provide information regarding PTA.  Pt will benefit from skilled OT to increase their independence and safety with adls and balance to allow discharge AIR.       Recommendations for follow up therapy are one component of a multi-disciplinary discharge planning process, led by the attending physician.  Recommendations may be updated based on patient status, additional functional criteria and insurance authorization.   Follow Up Recommendations  Acute inpatient rehab (3hours/day)     Assistance Recommended at Discharge Frequent or constant Supervision/Assistance  Patient can return home with the following Two people to help with bathing/dressing/bathroom    Functional Status Assessment  Patient has had a recent decline in their functional status and demonstrates the ability to make significant improvements in function in a reasonable and predictable amount of time.  Equipment Recommendations  Other (comment) (TBA)    Recommendations for Other Services Rehab consult     Precautions / Restrictions Precautions Precautions: Fall      Mobility Bed Mobility Overal bed mobility: Needs Assistance Bed Mobility: Supine to Sit, Sit to Supine, Rolling Rolling: +2 for physical assistance, Max assist   Supine to sit: +2 for physical assistance, Max  assist Sit to supine: +2 for physical assistance, Max assist        Transfers Overall transfer level: Needs assistance   Transfers: Sit to/from Stand Sit to Stand: +2 physical assistance, Max assist           General transfer comment: pad used and counting with anterior rocking momentum. pt initiated task but did not sustain      Balance Overall balance assessment: Needs assistance Sitting-balance support: No upper extremity supported, Feet supported Sitting balance-Leahy Scale: Poor   Postural control: Posterior lean Standing balance support: No upper extremity supported, During functional activity, Reliant on assistive device for balance Standing balance-Leahy Scale: Zero                             ADL either performed or assessed with clinical judgement   ADL Overall ADL's : Needs assistance/impaired                                       General ADL Comments: (A) for all adls at this time due to level of arousal. pt unable to follow simple 1 step commands     Vision Baseline Vision/History: 1 Wears glasses Additional Comments: wears glasses since May 2023 all activities (difficult to assess as pt keeps eyes closed. pt with L gaze with eye lids manual opening. pt does not follow commands for testing)     Perception     Praxis      Pertinent Vitals/Pain Pain Assessment Pain Assessment: No/denies pain     Hand Dominance Right  Extremity/Trunk Assessment Upper Extremity Assessment Upper Extremity Assessment: Generalized weakness;RUE deficits/detail;LUE deficits/detail RUE Deficits / Details: able to squeeze hand but unable to hold against gravity LUE Deficits / Details: able to squeeze hand but unable to hold against gravity   Lower Extremity Assessment Lower Extremity Assessment: Defer to PT evaluation;RLE deficits/detail;LLE deficits/detail RLE Deficits / Details: observed moving and wiggled toes LLE Deficits / Details:  observed moving and wiggled toes   Cervical / Trunk Assessment Cervical / Trunk Assessment: Normal   Communication Communication Communication: Other (comment) (wife reports sometimes HOH but normally hears fine)   Cognition Arousal/Alertness: Lethargic, Suspect due to medications Behavior During Therapy: Flat affect Overall Cognitive Status: Difficult to assess                                 General Comments: pt s/p ativan for MRI and very drowsy     General Comments  HR max 130 during session, RA    Exercises     Shoulder Instructions      Home Living Family/patient expects to be discharged to:: Private residence Living Arrangements: Spouse/significant other Available Help at Discharge: Family;Available PRN/intermittently Type of Home: House Home Access: Stairs to enter [employer] of Steps: 3-4 Entrance Stairs-Rails: Right Home Layout: One level     Bathroom Shower/Tub: Tub/shower unit;Walk-in shower (pt uses walked)   Biochemist, clinical: Standard     Home Equipment: Conservation officer, nature (2 wheels);Cane - single point;Shower seat   Additional Comments: spouse works but pts son and pt wife mother ( mother in Sports coach) plan to assist as needed      Prior Functioning/Environment Prior Level of Function : Independent/Modified Independent;Driving             Mobility Comments: Hydrographic surveyor without AD, driving ADLs Comments: Independent        OT Problem List: Decreased strength;Decreased activity tolerance;Impaired balance (sitting and/or standing);Impaired vision/perception;Decreased cognition;Decreased coordination;Decreased safety awareness;Decreased knowledge of use of DME or AE;Decreased knowledge of precautions;Impaired UE functional use      OT Treatment/Interventions: Self-care/ADL training;Therapeutic exercise;Neuromuscular education;Energy conservation;DME and/or AE instruction;Manual therapy;Modalities;Therapeutic  activities;Cognitive remediation/compensation;Visual/perceptual remediation/compensation;Patient/family education;Balance training    OT Goals(Current goals can be found in the care plan section) Acute Rehab OT Goals Patient Stated Goal: none stated by pt OT Goal Formulation: With family Time For Goal Achievement: 09/10/22 Potential to Achieve Goals: Good  OT Frequency: Min 2X/week    Co-evaluation PT/OT/SLP Co-Evaluation/Treatment: Yes Reason for Co-Treatment: Complexity of the patient's impairments (multi-system involvement);Necessary to address cognition/behavior during functional activity;For patient/therapist safety;To address functional/ADL transfers   OT goals addressed during session: ADL's and self-care;Strengthening/ROM      AM-PAC OT "6 Clicks" Daily Activity     Outcome Measure Help from another person eating meals?: Total Help from another person taking care of personal grooming?: Total Help from another person toileting, which includes using toliet, bedpan, or urinal?: Total Help from another person bathing (including washing, rinsing, drying)?: Total Help from another person to put on and taking off regular upper body clothing?: Total Help from another person to put on and taking off regular lower body clothing?: Total 6 Click Score: 6   End of Session Nurse Communication: Mobility status;Precautions  Activity Tolerance: Patient limited by lethargy Patient left: in bed;with call bell/phone within reach;with restraints reapplied (waist x2 mittens)  OT Visit Diagnosis: Unsteadiness on feet (R26.81);Muscle weakness (generalized) (M62.81)  Time: 1901-2224 OT Time Calculation (min): 19 min Charges:  OT General Charges $OT Visit: 1 Visit OT Evaluation $OT Eval Moderate Complexity: 1 Mod   Brynn, OTR/L  Acute Rehabilitation Services Office: [phone] .   Jeri Modena 08/27/2022, 12:30 PM

## 2022-08-28 ENCOUNTER — Inpatient Hospital Stay (HOSPITAL_COMMUNITY): Payer: Medicare Other

## 2022-08-28 DIAGNOSIS — D72829 Elevated white blood cell count, unspecified: Secondary | ICD-10-CM

## 2022-08-28 DIAGNOSIS — R509 Fever, unspecified: Secondary | ICD-10-CM | POA: Diagnosis not present

## 2022-08-28 DIAGNOSIS — R401 Stupor: Secondary | ICD-10-CM | POA: Diagnosis not present

## 2022-08-28 DIAGNOSIS — I4891 Unspecified atrial fibrillation: Secondary | ICD-10-CM

## 2022-08-28 LAB — RESPIRATORY PANEL BY PCR

## 2022-08-28 LAB — BASIC METABOLIC PANEL
Anion gap: 12 (ref 5–15)
BUN: 27 mg/dL — ABNORMAL HIGH (ref 8–23)
CO2: 22 mmol/L (ref 22–32)
Calcium: 8.9 mg/dL (ref 8.9–10.3)
Chloride: 104 mmol/L (ref 98–111)
Creatinine, Ser: 1.96 mg/dL — ABNORMAL HIGH (ref 0.61–1.24)
GFR, Estimated: 35 mL/min — ABNORMAL LOW (ref >60)
Glucose, Bld: 108 mg/dL — ABNORMAL HIGH (ref 70–99)
Potassium: 3.8 mmol/L (ref 3.5–5.1)
Sodium: 138 mmol/L (ref 135–145)

## 2022-08-28 LAB — PROTEIN AND GLUCOSE, CSF
Glucose, CSF: 74 mg/dL — ABNORMAL HIGH (ref 40–70)
Total  Protein, CSF: 32 mg/dL (ref 15–45)

## 2022-08-28 LAB — MENINGITIS/ENCEPHALITIS PANEL (CSF)

## 2022-08-28 LAB — CSF CELL COUNT WITH DIFFERENTIAL
RBC Count, CSF: 6 /mm3 — ABNORMAL HIGH
RBC Count, CSF: 27 /mm3 — ABNORMAL HIGH
Tube #: 1
Tube #: 4
WBC, CSF: 1 /mm3 (ref 0–5)
WBC, CSF: 1 /mm3 (ref 0–5)

## 2022-08-28 LAB — CBC
HCT: 42.2 % (ref 39.0–52.0)
Hemoglobin: 14 g/dL (ref 13.0–17.0)
MCH: 30.1 pg (ref 26.0–34.0)
MCHC: 33.2 g/dL (ref 30.0–36.0)
MCV: 90.8 fL (ref 80.0–100.0)
Platelets: 196 10*3/uL (ref 150–400)
RBC: 4.65 MIL/uL (ref 4.22–5.81)
RDW: 14.1 % (ref 11.5–15.5)
WBC: 18.7 10*3/uL — ABNORMAL HIGH (ref 4.0–10.5)
nRBC: 0 % (ref 0.0–0.2)

## 2022-08-28 LAB — CRYPTOCOCCAL ANTIGEN, CSF: Crypto Ag: NEGATIVE

## 2022-08-28 MED ORDER — PIPERACILLIN-TAZOBACTAM 3.375 G IVPB
3.3750 g | Freq: Three times a day (TID) | INTRAVENOUS | Status: DC
  Administered 2022-08-28 – 2022-09-01 (×11): 3.375 g via INTRAVENOUS
  Filled 2022-08-28 (×11): qty 50

## 2022-08-28 MED ORDER — DILTIAZEM LOAD VIA INFUSION
15.0000 mg | Freq: Once | INTRAVENOUS | Status: AC
  Administered 2022-08-28: 15 mg via INTRAVENOUS
  Filled 2022-08-28: qty 15

## 2022-08-28 MED ORDER — PANTOPRAZOLE SODIUM 40 MG PO TBEC
40.0000 mg | DELAYED_RELEASE_TABLET | Freq: Every day | ORAL | Status: DC
  Administered 2022-08-29: 40 mg via ORAL
  Filled 2022-08-28: qty 1

## 2022-08-28 MED ORDER — LORAZEPAM 2 MG/ML IJ SOLN
INTRAMUSCULAR | Status: AC
  Filled 2022-08-28: qty 1

## 2022-08-28 MED ORDER — CLOTRIMAZOLE 10 MG MT TROC
10.0000 mg | Freq: Every day | OROMUCOSAL | Status: DC
  Administered 2022-08-28 – 2022-08-31 (×9): 10 mg via ORAL
  Filled 2022-08-28 (×15): qty 1

## 2022-08-28 MED ORDER — METOPROLOL TARTRATE 5 MG/5ML IV SOLN
5.0000 mg | Freq: Once | INTRAVENOUS | Status: AC
  Administered 2022-08-28: 5 mg via INTRAVENOUS
  Filled 2022-08-28: qty 5

## 2022-08-28 MED ORDER — LORAZEPAM 2 MG/ML IJ SOLN
1.0000 mg | Freq: Once | INTRAMUSCULAR | Status: AC
  Administered 2022-08-28: 1 mg via INTRAVENOUS

## 2022-08-28 MED ORDER — DILTIAZEM HCL-DEXTROSE 125-5 MG/125ML-% IV SOLN (PREMIX)
5.0000 mg/h | INTRAVENOUS | Status: DC
  Administered 2022-08-28: 5 mg/h via INTRAVENOUS
  Administered 2022-08-29: 10 mg/h via INTRAVENOUS
  Administered 2022-08-29: 15 mg/h via INTRAVENOUS
  Filled 2022-08-28 (×5): qty 125

## 2022-08-28 MED ORDER — LACTATED RINGERS IV BOLUS
500.0000 mL | Freq: Once | INTRAVENOUS | Status: AC
  Administered 2022-08-28: 500 mL via INTRAVENOUS

## 2022-08-28 NOTE — Progress Notes (Signed)
Pharmacy Antibiotic Note  [name] is a 76 y.o. male admitted on 08/26/2022 with sepsis, source unknown. Pharmacy has been consulted for Zosyn dosing. Patient is CKD3, Scr 1.96 slightly elevated from baseline. WBC elevated at 18.7, Tmax 102.65F.   Plan: Zosyn 3.375 g IV q8h EI  Monitor renal function, cultures, s/sx improvement     Temp (24hrs), Avg:99.4 F (37.4 C), Min:97.9 F (36.6 C), Max:102.2 F (39 C)  Recent Labs  Lab 08/26/22 1004 08/26/22 1009 08/27/22 1041 08/28/22 0428  WBC 12.8*  --  14.7* 18.7*  CREATININE 1.81* 1.70* 1.80* 1.96*    Estimated Creatinine Clearance: 30.5 mL/min (A) (by C-G formula based on SCr of 1.96 mg/dL (H)).    No Known Allergies  Antimicrobials this admission: 12/24 Zosyn >>    Microbiology results: 12/23 BCx: ngtd 12/24 CSF: ngtd 12/24 RVP: sent 12/22 MRSA PCR: neg  Thank you for allowing pharmacy to be a part of this patient's care.  Elsie Amis 08/28/2022 9:33 PM

## 2022-08-28 NOTE — Progress Notes (Addendum)
STROKE TEAM PROGRESS NOTE   INTERVAL HISTORY Patient seen at bedside with wife present in room. He is somnolent but alert and oriented x3. He has notable mild anomic aphasia and generalized weakness throughout that is non-focal.  Vitals:   08/28/22 0700 08/28/22 0800 08/28/22 0900 08/28/22 1000  BP: (!) 160/80 (!) 151/82 (!) 149/80 (!) 140/67  Pulse: (!) 117 (!) 117 (!) 121 (!) 119  Resp: (!) 35 (!) 27 (!) 23 19  Temp:  (!) 102.2 F (39 C)    TempSrc:  Axillary    SpO2: 100% 100% 100% 100%   CBC:  Recent Labs  Lab 08/26/22 1004 08/26/22 1009 08/27/22 1041 08/28/22 0428  WBC 12.8*  --  14.7* 18.7*  NEUTROABS 8.4*  --   --   --   HGB 12.0*   < > 14.7 14.0  HCT 35.0*   < > 44.1 42.2  MCV 90.4  --  90.2 90.8  PLT 182  --  226 196   < > = values in this interval not displayed.   Basic Metabolic Panel:  Recent Labs  Lab 08/27/22 1041 08/28/22 0428  NA 136 138  K 3.7 3.8  CL 103 104  CO2 22 22  GLUCOSE 116* 108*  BUN 28* 27*  CREATININE 1.80* 1.96*  CALCIUM 8.8* 8.9   Lipid Panel:  Recent Labs  Lab 08/27/22 0311  CHOL 138  TRIG 188*  HDL 27*  CHOLHDL 5.1  VLDL 38  LDLCALC 73   HgbA1c:  Recent Labs  Lab 08/26/22 1646  HGBA1C 5.8*   Urine Drug Screen:  Recent Labs  Lab 08/26/22 1002  LABOPIA NONE DETECTED  COCAINSCRNUR NONE DETECTED  LABBENZ NONE DETECTED  AMPHETMU NONE DETECTED  THCU NONE DETECTED  LABBARB NONE DETECTED    Alcohol Level  Recent Labs  Lab 08/26/22 1004  ETH <10    IMAGING past 24 hours EEG adult  Result Date: 08/27/2022 Derek Jack, MD     08/27/2022  3:57 PM Routine EEG Report YACOB WILKERSON is a 76 y.o. male with a history of altered mental status who is undergoing an EEG to evaluate for seizures. Report: This EEG was acquired with electrodes placed according to the International 10-20 electrode system (including Fp1, Fp2, F3, F4, C3, C4, P3, P4, O1, O2, T3, T4, T5, T6, A1, A2, Fz, Cz, Pz). The following electrodes  were missing or displaced: none. The occipital dominant rhythm was 7-8 Hz. This activity is reactive to stimulation. Drowsiness was manifested by background fragmentation; deeper stages of sleep were not identified. There was no focal slowing. There were no interictal epileptiform discharges. There were no electrographic seizures identified. Photic stimulation and hyperventilation were not performed. Impression and clinical correlation: This EEG was obtained while awake and drowsy and is abnormal due to mild diffuse slowing indicative of global cerebral dysfunction. Epileptiform abnormalities were not seen during this recording. Su Monks, MD [facility] [phone] If 7pm- 7am, please page neurology on call as listed in [place].    PHYSICAL EXAM General: in no acute distress HEENT: normocephalic and atraumatic Cardiovascular: tachycardic Respiratory: normal respiratory effort and on RA Gastrointestinal: non-tender and non-distended Extremities: moving all extremities spontaneously  Neuro - patient obtunded, eyes barely open on voice and tactile stimulation, not answer questions, not waking up for exam. Nonverbal today, not following commands. Eyes midline with forced eye opening, not tracking or blinking to visual threat bilaterally. No facial droop. Tongue midline. Moving all extremities with pain  stimulation. Sensation, coordination not cooperative and gait not tested.    ASSESSMENT/PLAN EDON HOADLEY is a 76 y.o. male with PMH significant for CKD3, HTN, RA, HLD, GERD, pituitary macroadenoma and headaches who presents as a code stroke for aphasia and mild R sided weakness. He complained of abdominal pain and went to bathroom at [employer] and found leaning against the wall confused. Given TNK on admission. Presentation c/f stroke vs post-ictal state.  #AMS - ? Post-ictal state vs. ? Stroke Code Stroke CT Head No evidence of acute intracranial hemorrhage or acute infarct. Known  pituitary macroadenoma, incompletely assessed on this non-contrast head CT, but grossly unchanged from the prior brain MRI of 04/06/2022.Advanced chronic small vessel image changes within the cerebral white matter. Mild generalized cerebral atrophy. ASPECTS 10. CTA head & neck shows no emergent large vessel occlusion. No hemodynamically significant stenosis in the neck. Redemonstrated pituitary macroadenoma, which invades the right cavernous sinus. CT repeat no acute finding MRI pending 2D Echo EF 60-65% LDL 73 HgbA1c 5.8 VTE prophylaxis - SCDs No antithrombotic prior to admission, now on No antithrombotic pending diagnosis EEG mild diffuse slowing indicative of global cerebral dysfunction  Therapy recommendations:  CIR Disposition:  pending   Hypertension Home meds:  amlodipine, losartan. Held on admission. Stable Long-term BP goal normotensive  Hyperlipidemia Home meds: pravastatin 40 mg, held hospital LDL 73, goal < 70 On home pravastatin Continue statin on ischarge  Leukocytosis Fever  WBC 12.8->14.7->18.7 Tmax 103->102.2 UA neg CXR neg Blood culture pending continue to trend WBC, fever curve Will consider LP  Other Stroke Risk Factors Advanced Age >/= 57   Other Active Problems Gout Continue home etanercept and allopurinol and prednisone. GERD: Continue home pantoprazole: Insomnia Continue home trazodone. Anxiety and depression Continue escitalopram  Hospital day # 2  This patient is critically ill due to AMS, high grade fever, s/p TNK and at significant risk of neurological worsening, death form bleeding from TNK, sepsis, seizure. This patient's care requires constant monitoring of vital signs, hemodynamics, respiratory and cardiac monitoring, review of multiple databases, neurological assessment, discussion with family, other specialists and medical decision making of high complexity. I spent 35 minutes of neurocritical care time in the care of this patient. I  had long discussion with wife at bedside, updated pt current condition, treatment plan and potential prognosis, and answered all the questions. She expressed understanding and appreciation.   Rosalin Hawking, MD PhD Stroke Neurology 08/28/2022 12:14 PM      To contact Stroke Continuity provider, please refer to [email or web address] After hours, contact General Neurology

## 2022-08-28 NOTE — Progress Notes (Addendum)
[facility] Progress Note Patient Name: [name] DOB: 10/25/1945 MRN: [id number]   Date of Service  08/28/2022  HPI/Events of Note  HR 140's. BP 128/85 (97)   EKG in EPIC.  Already had metropolol '5mg'$  x2. Did not help.   Camera: HR 149, narrow complex tachy Sats 100%. SBP 128   eICU Interventions  Discussed with bed side RN. NP already seen and placing orders. EKG sinus tachy vs a flutter?[name] No acute ST T changes.  DNR. To call back NP- Iona Beard, if orders not placed for above.      Intervention Category Intermediate Interventions: Arrhythmia - evaluation and management  Elmer Sow 08/28/2022, 8:47 PM  21:10 Camera: HR 133, MAP < 65. SBP 88, getting x ray.  Discussed with RN.  Stat LR 500 ml bolus Dr Elisabeth Cara placing cardizem gtt, per secure chat discussion  3:41 Camera: He is trying to get out, confused. Discussed with RN. HR 110. SBP 117.  - low dose ativan IV once ordered. Avoiding precedex, while on cardizem gtt for a fib RVR.

## 2022-08-28 NOTE — Progress Notes (Signed)
[place] to RN for floor mats per safety plan

## 2022-08-28 NOTE — Progress Notes (Signed)
Inpatient Rehab Admissions Coordinator:   Per therapy recommendations, patient was screened for CIR candidacy by Clemens Catholic, MS, CCC-SLP . At this time, Pt. is not at a level to tolerate intensity of CIR however,   Pt. may have potential to progress to becoming a potential CIR candidate, so CIR admissions team will follow and monitor for progress and participation with therapies and place consult order if Pt. appears to be an appropriate candidate. Please contact me with any questions.    Clemens Catholic, [place], [place] Admissions Coordinator  [phone] ([place]) [phone] (office)

## 2022-08-28 NOTE — Procedures (Signed)
Procedure Note: Lumbar puncture  Indications: assess for CNS pathology   Operator: Rosalin Hawking, MD, PhD  Others present: RN  Indications, risks, and benefits explained to patient / surrogate decision maker and informed consent obtained. Time-out was performed, with all individuals present agreeing on the procedure to be performed, the site of procedure, and the patient identity.  Patient positioned, prepped and draped in usual sterile fashion. L3-4 space located using bilateral iliac crests as landmarks. 1% Lidocaine without epinephrine was used to anesthetize the area. An 20G spinal needle was introduced into the sub- arachnoid space. Stylet was removed with appropriate fluid return. Needle removed after adequate fluid collected. Blood loss was minimal. A dry guaze dressing was placed over insertion site. Patient tolerated the procedure well and no complications were observed. Spontaneous movement of bilateral extremities were observed after the procedure.  Opening pressure: 18 cm H20 (hips & legs semi-extended)   Total fluid removed: 9cc  Color of fluid: clear  Sent for: cell count + diff , gram stain, cultures, glucose, protein, crypto antigen, HSV PCR, VDRL  Rosalin Hawking, MD PhD Stroke Neurology 08/28/2022 5:16 PM

## 2022-08-28 NOTE — Consult Note (Signed)
NAME:  [name], MRN:  [id number], DOB:  1946-04-04, LOS: 2 ADMISSION DATE:  08/26/2022, CONSULTATION DATE:  12/24 REFERRING MD:  Leonel Ramsay, REASON FOR CONSULT:  concern for infection   History of Present Illness:  76 yo male former smoker was found by his wife in a restaurant bathroom.  He was lip smacking, aphasic, shivering and confused.  He had a Lt sided gaze preference with Rt sided tremor.  He was brought to ER, and received haldol and ativan for agitation.  He was seen by neurology, CT head/neck negative, and received TNK for possible stroke.  He developed fever 103F on 12/23.  EEG was negative for seizure.  He had negative U/A and blood culture ordered.  He continued to have fever and increased WBC.  LP was negative.  He was found to have new onset A fib with RVR.  He has a hx of rheumatoid arthritis on chronic prednisone and enbrel.  PCCM consulted to assist with management.  Pertinent  Medical History  CKD 3, Rheumatoid arthritis, Anxiety and depression, Back pain, GERD, Gout, Stomach ulcer, HLD, HTN, Diverticulitis, Macroadenoma with chronic headache  Significant Hospital Events: Including procedures, antibiotic start and stop dates in addition to other pertinent events   12/22 present to [facility] 12/23 fever 12/24 persistent fever, new onset A fib with RVR  Studies:  CT abd/pelvis 08/26/22 >> s/p cholecystectomy, b/l renal cysts, s/p appendectomy, sigmoid diverticulosis, aortic atherosclerosis Echo 08/26/22 >> EF 60 to 65% MRI brain 08/28/22 >> volume loss, chronic small vessel ischemic changes, 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  Objective   BP 130/84   Pulse (!) 126   Temp 99.4 F (37.4 C) (Axillary)   Resp (!) 22   SpO2 100%   I/O last 3 completed shifts: In: 2736.8 [I.V.:2736.8] Out: 950 [Urine:950]  Examination:  General -  somnolent Eyes - pupils reactive ENT - white exudate posterior pharynx, wears dentures Cardiac - irregular, tachycardic Chest - equal breath sounds b/l, no wheezing or rales Abdomen - soft, non tender, + bowel sounds Extremities - no cyanosis, clubbing, or edema Skin - faint red rash over upper neck and chest area Neuro - wakes up easily with stimulation, able to state his name and location, follows commands appropriately  Resolved Hospital Problem list     Assessment & Plan:   Fever with leukocytosis. - main concern is for infection, but source not clear - initial chest xray and urinalysis negative, and abdominal exam benign - on chronic prednisone for hx of rheumatoid arthritis so he could have atypical findings for infectious process - blood cultures from 12/23 pending - will check respiratory viral panel, TSH, LFT's, doppler legs - repeat chest xray - start empiric antibiotics - continue NS IV fluid at 100 ml/hr  New onset A fib with RVR. Hx of HTN, HLD. - start cardizem gtt - continue pravastatin - goal blood pressure normotensive  Thrush. - start clotrimazole troche   Altered mental status. Hx of macroadenoma with chronic headache. Hx of anxiety with depression. - neuroimaging negative for acute findings - LP negative - d/c haldol  CKD 3b. - f/u BMET  Hx of rheumatoid arthritis, gout. - continue prednisone, allopurinol  Hx of GERD, PUD. - continue protonix  Dysphagia. - f/u with speech therapy  Best Practice (right click and "Reselect all SmartList Selections" daily)   Diet/type: dysphagia diet (see  orders) DVT prophylaxis: SCD GI prophylaxis: PPI Lines: N/A Foley:  N/A Code Status:  DNR Last date of multidisciplinary goals of care discussion '[x]'$   Labs   CBC: Recent Labs  Lab 08/26/22 1004 08/26/22 1009 08/27/22 1041 08/28/22 0428  WBC 12.8*  --  14.7* 18.7*  NEUTROABS 8.4*  --   --   --   HGB 12.0* 13.9 14.7 14.0  HCT 35.0* 41.0 44.1  42.2  MCV 90.4  --  90.2 90.8  PLT 182  --  226 371    Basic Metabolic Panel: Recent Labs  Lab 08/26/22 1004 08/26/22 1009 08/27/22 1041 08/28/22 0428  NA 138 140 136 138  K 4.0 4.1 3.7 3.8  CL 104 104 103 104  CO2 24  --  22 22  GLUCOSE 143* 140* 116* 108*  BUN 29* 37* 28* 27*  CREATININE 1.81* 1.70* 1.80* 1.96*  CALCIUM 8.4*  --  8.8* 8.9   GFR: Estimated Creatinine Clearance: 30.5 mL/min (A) (by C-G formula based on SCr of 1.96 mg/dL (H)). Recent Labs  Lab 08/26/22 1004 08/27/22 1041 08/28/22 0428  WBC 12.8* 14.7* 18.7*    Liver Function Tests: Recent Labs  Lab 08/26/22 1004  AST 29  ALT 19  ALKPHOS 57  BILITOT 0.8  PROT 6.3*  ALBUMIN 3.2*   No results for input(s): "LIPASE", "AMYLASE" in the last 168 hours. No results for input(s): "AMMONIA" in the last 168 hours.  ABG    Component Value Date/Time   TCO2 26 08/26/2022 1009     Coagulation Profile: Recent Labs  Lab 08/26/22 1004  INR 1.2    Cardiac Enzymes: No results for input(s): "CKTOTAL", "CKMB", "CKMBINDEX", "TROPONINI" in the last 168 hours.  HbA1C: Hgb A1c MFr Bld  Date/Time Value Ref Range Status  08/26/2022 04:46 PM 5.8 (H) 4.8 - 5.6 % Final    Comment:    (NOTE)         Prediabetes: 5.7 - 6.4         Diabetes: >6.4         Glycemic control for adults with diabetes: <7.0     CBG: Recent Labs  Lab 08/26/22 1001  GLUCAP 165*    Review of Systems:   Reviewed and negative  Past Medical History:  He,  has a past medical history of Anxiety and depression, Chronic renal insufficiency, stage 3 (moderate) (HCC), DDD (degenerative disc disease), lumbar, GERD (gastroesophageal reflux disease), Gout, History of stomach ulcers (2000), Hyperlipidemia, Hypertension, and Rheumatoid arthritis ([place]).   Surgical History:   Past Surgical History:  Procedure Laterality Date   APPENDECTOMY  1968   back fusion  2000   CHOLECYSTECTOMY N/A 06/06/2017   Procedure: LAPAROSCOPIC  CHOLECYSTECTOMY WITH INTRAOPERATIVE CHOLANGIOGRAM;  Surgeon: [name], MD;  Location: [facility];  Service: General;  Laterality: N/A;   EYE SURGERY     Dr. [name]  lens implants   [place]   with titanium rods   UMBILICAL HERNIA REPAIR N/A 06/06/2017   Procedure: UMBILICAL HERNIA REPAIR;  Surgeon: [name], MD;  Location: [facility];  Service: General;  Laterality: N/A;     Social History:   reports that he quit smoking about 44 years ago. His smoking use included cigarettes. He has a 40.00 pack-year smoking history. He has never used smokeless tobacco. He reports that he does not drink alcohol and does not use drugs.   Family History:  His family history  includes CVA in his mother; Cancer in his maternal grandmother and mother; Healthy in his son. There is no history of Colon cancer.   Allergies No Known Allergies   Home Medications  Prior to Admission medications   Medication Sig Start Date End Date Taking? Authorizing Provider  acetaminophen (TYLENOL) 500 MG tablet Take 1 tablet (500 mg total) by mouth every 6 (six) hours as needed for mild pain or headache. 12/28/21  Yes Sheikh, Omair Latif, DO  allopurinol (ZYLOPRIM) 300 MG tablet Take 300 mg by mouth daily.   Yes [provider]  escitalopram (LEXAPRO) 20 MG tablet Take 20 mg by mouth at bedtime.    Yes [provider]  Etanercept (ENBREL [place]) Inject 1 Dose into the skin every Monday.   Yes [provider]  folic acid (FOLVITE) 1 MG tablet Take 1 mg by mouth daily.   Yes [provider]  ibuprofen (ADVIL) 600 MG tablet Take 600 mg by mouth every 6 (six) hours as needed for mild pain. 07/14/22  Yes [provider]  loperamide (IMODIUM) 2 MG capsule Take 1 capsule (2 mg total) by mouth every 6 (six) hours as needed for diarrhea or loose stools. 01/14/22  Yes Kathie Dike, MD  losartan-hydrochlorothiazide (HYZAAR) 100-25 MG tablet Take 1  tablet by mouth daily. 08/22/22  Yes [provider]  Omega-3 Fatty Acids (FISH OIL) 1200 MG CAPS Take 6,000 mg by mouth daily. Takes 5 capsules every morning   Yes [provider]  pantoprazole (PROTONIX) 40 MG tablet Take 40 mg by mouth daily as needed (pain).   Yes [provider]  pravastatin (PRAVACHOL) 40 MG tablet Take 40 mg by mouth daily. 04/15/21  Yes [provider]  predniSONE (DELTASONE) 5 MG tablet Take 5 mg by mouth daily with breakfast.   Yes [provider]  propranolol ER (INDERAL LA) 60 MG 24 hr capsule Take 1 capsule (60 mg total) by mouth daily. Please call and schedule appointment for March 2024 or sooner if symptoms change. 06/16/22  Yes McCue, Janett Billow, NP  testosterone cypionate (DEPOTESTOSTERONE CYPIONATE) 200 MG/ML injection Inject 200 mg into the muscle every 14 (fourteen) days. 07/30/22  Yes [provider]  traMADol (ULTRAM) 50 MG tablet Take 50 mg by mouth 2 (two) times daily as needed for moderate pain. 06/02/22  Yes [provider]  traZODone (DESYREL) 50 MG tablet Take 50 mg by mouth at bedtime as needed for sleep. 02/13/21  Yes [provider]  amLODipine (NORVASC) 5 MG tablet Take 5 mg by mouth daily. 08/22/22   [provider]  oxyCODONE-acetaminophen (PERCOCET) 10-325 MG tablet Take 1 tablet by mouth every 6 (six) hours as needed for pain. Patient not taking: Reported on 08/26/2022 01/03/22   Melvenia Beam, MD     Signature:  Chesley Mires, MD [place] Pager - [phone] or [phone] 08/28/2022, 9:05 PM

## 2022-08-29 ENCOUNTER — Inpatient Hospital Stay (HOSPITAL_COMMUNITY): Payer: Medicare Other

## 2022-08-29 DIAGNOSIS — R509 Fever, unspecified: Secondary | ICD-10-CM | POA: Diagnosis not present

## 2022-08-29 DIAGNOSIS — G9341 Metabolic encephalopathy: Secondary | ICD-10-CM | POA: Diagnosis not present

## 2022-08-29 DIAGNOSIS — D72829 Elevated white blood cell count, unspecified: Secondary | ICD-10-CM | POA: Diagnosis not present

## 2022-08-29 LAB — BASIC METABOLIC PANEL
Anion gap: 11 (ref 5–15)
BUN: 28 mg/dL — ABNORMAL HIGH (ref 8–23)
CO2: 22 mmol/L (ref 22–32)
Calcium: 8.4 mg/dL — ABNORMAL LOW (ref 8.9–10.3)
Chloride: 108 mmol/L (ref 98–111)
Creatinine, Ser: 1.63 mg/dL — ABNORMAL HIGH (ref 0.61–1.24)
GFR, Estimated: 43 mL/min — ABNORMAL LOW (ref >60)
Glucose, Bld: 114 mg/dL — ABNORMAL HIGH (ref 70–99)
Potassium: 3.6 mmol/L (ref 3.5–5.1)
Sodium: 141 mmol/L (ref 135–145)

## 2022-08-29 LAB — CBC
HCT: 38.6 % — ABNORMAL LOW (ref 39.0–52.0)
Hemoglobin: 12.8 g/dL — ABNORMAL LOW (ref 13.0–17.0)
MCH: 30 pg (ref 26.0–34.0)
MCHC: 33.2 g/dL (ref 30.0–36.0)
MCV: 90.4 fL (ref 80.0–100.0)
Platelets: 178 10*3/uL (ref 150–400)
RBC: 4.27 MIL/uL (ref 4.22–5.81)
RDW: 14 % (ref 11.5–15.5)
WBC: 14.9 10*3/uL — ABNORMAL HIGH (ref 4.0–10.5)
nRBC: 0 % (ref 0.0–0.2)

## 2022-08-29 LAB — TSH: TSH: 3.441 u[IU]/mL (ref 0.350–4.500)

## 2022-08-29 LAB — LACTATE DEHYDROGENASE: LDH: 235 U/L — ABNORMAL HIGH (ref 98–192)

## 2022-08-29 LAB — AMMONIA: Ammonia: 23 umol/L (ref 9–35)

## 2022-08-29 LAB — HEPATIC FUNCTION PANEL
ALT: 20 U/L (ref 0–44)
AST: 35 U/L (ref 15–41)
Albumin: 2.8 g/dL — ABNORMAL LOW (ref 3.5–5.0)
Alkaline Phosphatase: 54 U/L (ref 38–126)
Bilirubin, Direct: 0.2 mg/dL (ref 0.0–0.2)
Indirect Bilirubin: 0.8 mg/dL (ref 0.3–0.9)
Total Bilirubin: 1 mg/dL (ref 0.3–1.2)
Total Protein: 6.1 g/dL — ABNORMAL LOW (ref 6.5–8.1)

## 2022-08-29 LAB — STREP PNEUMONIAE URINARY ANTIGEN: Strep Pneumo Urinary Antigen: NEGATIVE

## 2022-08-29 LAB — PROCALCITONIN: Procalcitonin: 0.58 ng/mL

## 2022-08-29 LAB — D-DIMER, QUANTITATIVE: D-Dimer, Quant: 3.84 ug/mL-FEU — ABNORMAL HIGH (ref 0.00–0.50)

## 2022-08-29 MED ORDER — HEPARIN SODIUM (PORCINE) 5000 UNIT/ML IJ SOLN
5000.0000 [IU] | Freq: Three times a day (TID) | INTRAMUSCULAR | Status: DC
  Administered 2022-08-29 – 2022-09-01 (×9): 5000 [IU] via SUBCUTANEOUS
  Filled 2022-08-29 (×9): qty 1

## 2022-08-29 MED ORDER — IOHEXOL 350 MG/ML SOLN
75.0000 mL | Freq: Once | INTRAVENOUS | Status: AC | PRN
  Administered 2022-08-29: 75 mL via INTRAVENOUS

## 2022-08-29 MED ORDER — SODIUM CHLORIDE 0.9 % IV SOLN
500.0000 mg | INTRAVENOUS | Status: AC
  Administered 2022-08-29 – 2022-09-01 (×4): 500 mg via INTRAVENOUS
  Filled 2022-08-29 (×4): qty 5

## 2022-08-29 MED ORDER — LORAZEPAM 2 MG/ML IJ SOLN
0.5000 mg | Freq: Once | INTRAMUSCULAR | Status: AC
  Administered 2022-08-29: 0.5 mg via INTRAVENOUS
  Filled 2022-08-29: qty 1

## 2022-08-29 MED ORDER — POTASSIUM CHLORIDE 20 MEQ PO PACK: 40.0000 meq | PACK | Freq: Once | ORAL | Status: DC

## 2022-08-29 MED ORDER — QUETIAPINE FUMARATE 25 MG PO TABS
25.0000 mg | ORAL_TABLET | Freq: Every day | ORAL | Status: DC
  Administered 2022-08-29 – 2022-08-30 (×2): 25 mg via ORAL
  Filled 2022-08-29 (×3): qty 1

## 2022-08-29 MED ORDER — DEXMEDETOMIDINE HCL IN NACL 400 MCG/100ML IV SOLN
INTRAVENOUS | Status: AC
  Administered 2022-08-29: 0.4 ug/kg/h via INTRAVENOUS
  Filled 2022-08-29: qty 100

## 2022-08-29 MED ORDER — POTASSIUM CHLORIDE 10 MEQ/100ML IV SOLN
10.0000 meq | INTRAVENOUS | Status: AC
  Administered 2022-08-29 (×4): 10 meq via INTRAVENOUS
  Filled 2022-08-29 (×4): qty 100

## 2022-08-29 MED ORDER — FUROSEMIDE 10 MG/ML IJ SOLN
40.0000 mg | Freq: Once | INTRAMUSCULAR | Status: AC
  Administered 2022-08-29: 40 mg via INTRAVENOUS
  Filled 2022-08-29: qty 4

## 2022-08-29 MED ORDER — IPRATROPIUM-ALBUTEROL 0.5-2.5 (3) MG/3ML IN SOLN
3.0000 mL | Freq: Four times a day (QID) | RESPIRATORY_TRACT | Status: AC
  Administered 2022-08-29 – 2022-08-30 (×3): 3 mL via RESPIRATORY_TRACT
  Filled 2022-08-29 (×3): qty 3

## 2022-08-29 MED ORDER — DEXMEDETOMIDINE HCL IN NACL 400 MCG/100ML IV SOLN
0.4000 ug/kg/h | INTRAVENOUS | Status: DC
  Filled 2022-08-29: qty 100

## 2022-08-29 MED ORDER — ALBUMIN HUMAN 25 % IV SOLN
25.0000 g | Freq: Once | INTRAVENOUS | Status: AC
  Administered 2022-08-29: 25 g via INTRAVENOUS
  Filled 2022-08-29: qty 100

## 2022-08-29 NOTE — Progress Notes (Addendum)
STROKE TEAM PROGRESS NOTE   INTERVAL HISTORY Patient seen at bedside with wife present in room. He is somnolent but alert, not oriented to place. He continues to have notable mild anomic aphasia and generalized weakness throughout that is non-focal. CCM onboard for elevated heart rate overnight, started patient on diltiazem drip.  Vitals:   08/29/22 0700 08/29/22 0730 08/29/22 0800 08/29/22 0830  BP: (!) 144/71 127/79 (!) 116/96 (!) 144/67  Pulse: 85 (!) 200 95 94  Resp: 19 (!) 28 (!) 26 (!) 23  Temp:   97.7 F (36.5 C)   TempSrc:   Oral   SpO2: 91% 100% 100% 100%   CBC:  Recent Labs  Lab 08/26/22 1004 08/26/22 1009 08/28/22 0428 08/29/22 0344  WBC 12.8*   < > 18.7* 14.9*  NEUTROABS 8.4*  --   --   --   HGB 12.0*   < > 14.0 12.8*  HCT 35.0*   < > 42.2 38.6*  MCV 90.4   < > 90.8 90.4  PLT 182   < > 196 178   < > = values in this interval not displayed.    Basic Metabolic Panel:  Recent Labs  Lab 08/28/22 0428 08/29/22 0344  NA 138 141  K 3.8 3.6  CL 104 108  CO2 22 22  GLUCOSE 108* 114*  BUN 27* 28*  CREATININE 1.96* 1.63*  CALCIUM 8.9 8.4*    Lipid Panel:  Recent Labs  Lab 08/27/22 0311  CHOL 138  TRIG 188*  HDL 27*  CHOLHDL 5.1  VLDL 38  LDLCALC 73    HgbA1c:  Recent Labs  Lab 08/26/22 1646  HGBA1C 5.8*    Urine Drug Screen:  Recent Labs  Lab 08/26/22 1002  LABOPIA NONE DETECTED  COCAINSCRNUR NONE DETECTED  LABBENZ NONE DETECTED  AMPHETMU NONE DETECTED  THCU NONE DETECTED  LABBARB NONE DETECTED     Alcohol Level  Recent Labs  Lab 08/26/22 1004  ETH <10     IMAGING past 24 hours DG CHEST PORT 1 VIEW  Result Date: 08/28/2022 CLINICAL DATA:  Aspiration into airway EXAM: PORTABLE CHEST 1 VIEW COMPARISON:  08/26/2022 FINDINGS: No change from 08/24/2022. Stable cardiomediastinal silhouette. Aortic atherosclerotic calcification. No focal consolidation, pleural effusion, or pneumothorax. No acute osseous abnormality. IMPRESSION: No  change from 08/26/2022.  No active disease. Electronically Signed   By: Placido Sou M.D.   On: 08/28/2022 21:25   MR BRAIN WO CONTRAST  Result Date: 08/28/2022 CLINICAL DATA:  Aphasia and right-sided weakness. EXAM: MRI HEAD WITHOUT CONTRAST TECHNIQUE: Multiplanar, multiecho pulse sequences of the brain and surrounding structures were obtained without intravenous contrast. COMPARISON:  CT head 1 day prior, MR head 04/06/2022 FINDINGS: Brain: There is no acute intracranial hemorrhage, extra-axial fluid collection, or acute infarct. There is background parenchymal volume loss with prominence of the ventricular system and extra-axial CSF spaces. Patchy and confluent FLAIR signal abnormality throughout the supratentorial white matter likely reflects sequela of chronic small-vessel ischemic change, stable. 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 There is no other solid mass lesion there is no mass effect or midline shift. Vascular: Normal flow voids. Skull and upper cervical spine: Normal marrow signal. Sinuses/Orbits: There is mild mucosal thickening in the paranasal sinuses. Bilateral lens implants are in place. The globes and orbits are otherwise unremarkable. Other: None. IMPRESSION: 1. No acute intracranial pathology. Stable background chronic small-vessel ischemic change.  2. Stable known pituitary macro adenoma. Electronically Signed   By: Valetta Mole M.D.   On: 08/28/2022 15:24    PHYSICAL EXAM General: in no acute distress HEENT: normocephalic and atraumatic Cardiovascular: tachycardic and irregular Respiratory: normal respiratory effort and on RA Gastrointestinal: non-tender and non-distended Extremities: moving all extremities spontaneously  Neuro - patient somnolent, alert and oriented x3. Following commands appropriately. Moving all extremities spontaneously.  ASSESSMENT/PLAN [name] is a 76 y.o.  male with PMH significant for CKD3, HTN, RA, HLD, GERD, pituitary macroadenoma and headaches who presents as a code stroke for aphasia and mild R sided weakness. He complained of abdominal pain and went to bathroom at [employer] and found leaning against the wall confused. Given TNK on admission. Presentation c/f stroke vs post-ictal state.  Encephalopathy  Confusion and delirium and drowsiness Etiology not clear, could be due to fever vs. ? Post-ictal state EEG no seizure Mental status improving, continue monitor  AMS - ? Post-ictal state vs. ? TIA Code Stroke CT Head No evidence of acute intracranial hemorrhage or acute infarct. Known pituitary macroadenoma, incompletely assessed on this non-contrast head CT, but grossly unchanged from the prior brain MRI of 04/06/2022.Advanced chronic small vessel image changes within the cerebral white matter. Mild generalized cerebral atrophy. ASPECTS 10. CTA head & neck shows no emergent large vessel occlusion. No hemodynamically significant stenosis in the neck. Redemonstrated pituitary macroadenoma, which invades the right cavernous sinus. CT repeat no acute finding MRI showed no acute intracranial pathology. Stable known pituitary macro adenoma. 2D Echo EF 60-65% LDL 73 HgbA1c 5.8 VTE prophylaxis - SCDs No antithrombotic prior to admission, now on No antithrombotic pending diagnosis EEG mild diffuse slowing indicative of global cerebral dysfunction  Therapy recommendations:  CIR Disposition:  pending   Afib with RVR Aflutter  Not clear if this is isolated events due to fever and some infectious process Will continue to monitor If persistent > 48 hours, may need to consider anticoagulation On Cardizem IV Wheezing on exam, will decrease IVF and given lasix 40 x 1  Hypertension Home meds: amlodipine, losartan. Held on admission. Stable Long-term BP goal normotensive  Hyperlipidemia Home meds: pravastatin 40 mg, held hospital LDL 73, goal <  70 On home pravastatin Continue statin on ischarge  Leukocytosis Fever  WBC 12.8->14.7->18.7->14.9 Tmax 103->102.2->afebrile UA neg CXR neg Blood culture NGTD CSF unremarkable for infection continue to trend WBC, fever curve On zosyn for empiric treatment  AKI, improving Cr 1.96 > 1.63 IVF decrease from 100 mL to 75 mL  Given 1 dose furosemide 40 mg given wheezing on exam  Other Stroke Risk Factors Advanced Age >/= 3   Other Active Problems Gout and RA Continue home etanercept and allopurinol and prednisone. GERD: Continue home pantoprazole: Insomnia Continue home trazodone. Anxiety and depression Continue escitalopram  Hospital day # 3    ATTENDING NOTE: I reviewed above note and agree with the assessment and plan. Pt was seen and examined.   Wife and RN are at bedside.  Patient lying in bed, more weak alert, able to answer questions more appropriately, orientated to people, months age but not to place.  Still drowsy sleepy but able to repeat and name and follow most simple commands.  Moving all extremities.  Overnight no fever, however developed A-fib and a flutter with RVR, on Cardizem IV.  Leukocytosis and AKI also improving.  Still on IV fluid at 100 cc/h but some wheezing on exam, will decrease IV fluid to 75 cc/h and  will give Lasix 40 mg x 1.  CSF yesterday unremarkable.  On empiric treatment of Zosyn.  Continue monitor A-fib, not sure if isolated A-fib from fever and unknown infectious process.  If A-fib more than 48 hours, may consider heparin IV at that time.  Discussed with CCM Dr. Erin Fulling.  For detailed assessment and plan, please refer to above/below as I have made changes wherever appropriate.   Christian Hawking, MD PhD Stroke Neurology 08/29/2022 12:58 PM  This patient is critically ill due to strokelike symptoms status post TNK, correct procedure, encephalopathy, A-fib/a flutter with RVR, fever and at significant risk of neurological worsening, death form  status epilepticus, sepsis, heart failure. This patient's care requires constant monitoring of vital signs, hemodynamics, respiratory and cardiac monitoring, review of multiple databases, neurological assessment, discussion with family, other specialists and medical decision making of high complexity. I spent 40 minutes of neurocritical care time in the care of this patient. I had long discussion with wife at bedside, updated pt current condition, treatment plan and potential prognosis, and answered all the questions.  She expressed understanding and appreciation.      To contact Stroke Continuity provider, please refer to [email or web address] After hours, contact General Neurology

## 2022-08-29 NOTE — Progress Notes (Signed)
Patient agitated this afternoon and confused.  Precedex drip ordered. Will check CTA PE study given elevated D-dimer.  Freda Jackson, MD [facility] Office: [phone]   See Amion for personal pager PCCM on call pager [phone] until 7pm. Please call [facility] 7p-7a. [phone]

## 2022-08-29 NOTE — Progress Notes (Signed)
[facility] Progress Note Patient Name: VINEET KINNEY DOB: August 10, 1946 MRN: [id number]   Date of Service  08/29/2022  HPI/Events of Note  Notified of MAP 64. SBP is 93. Patient asleep on camera in no distress. SBP has been 90s all day, no acute symptoms and map is around 65. No fevers. Neuro notes say fluids were reduced. CCM gave an albumin bolus around 730 pm  eICU Interventions  Current map is ok If drops further then we will use low dose levophed Precedex and diltiazem are off  D/w RN     Intervention Category Major Interventions: Hypotension - evaluation and management  Margaretmary Lombard 08/29/2022, 10:27 PM

## 2022-08-29 NOTE — Progress Notes (Signed)
1506: Pt with increasing agitation and confusion. Pt pulling at lines and demanding to "get out of here". Multiple attempts were made to reorient pt. Pt continued noncompliance. MD aware orders noted.  1530: Orders noted for state chest CT, IV team consult placed for an additional IV.  1708: Attempted to call CT back with IV placement, CT cannot be completed at this time. Will try again soon.   1755: Precedex stopped and Cardizem paused due to decreasing BP, MD aware.  1810: Pt transported to and from CT.  1850: BP decreasing despite holding precedex and Cardizem. MD aware, goal is MAPs >65.

## 2022-08-29 NOTE — Progress Notes (Signed)
HR 160s, antibiotic paused to restart cardizem gtt. IV team en route for additional access.

## 2022-08-29 NOTE — Progress Notes (Signed)
NAME:  [name], MRN:  [id number], DOB:  [birth year], LOS: 3 ADMISSION DATE:  08/26/2022, CONSULTATION DATE:  12/24 REFERRING MD:  Leonel Ramsay, REASON FOR CONSULT:  concern for infection   History of Present Illness:  76 yo male former smoker was found by his wife in a restaurant bathroom.  He was lip smacking, aphasic, shivering and confused.  He had a Lt sided gaze preference with Rt sided tremor.  He was brought to ER, and received haldol and ativan for agitation.  He was seen by neurology, CT head/neck negative, and received TNK for possible stroke.  He developed fever 103F on 12/23.  EEG was negative for seizure.  He had negative U/A and blood culture ordered.  He continued to have fever and increased WBC.  LP was negative.  He was found to have new onset A fib with RVR.  He has a hx of rheumatoid arthritis on chronic prednisone and enbrel.  PCCM consulted to assist with management.  Pertinent  Medical History  CKD 3, Rheumatoid arthritis, Anxiety and depression, Back pain, GERD, Gout, Stomach ulcer, HLD, HTN, Diverticulitis, Macroadenoma with chronic headache  Significant Hospital Events: Including procedures, antibiotic start and stop dates in addition to other pertinent events   12/22 present to [facility] 12/23 fever 12/24 persistent fever, new onset A fib with RVR  Studies:  CT abd/pelvis 08/26/22 >> s/p cholecystectomy, b/l renal cysts, s/p appendectomy, sigmoid diverticulosis, aortic atherosclerosis Echo 08/26/22 >> EF 60 to 65% MRI brain 08/28/22 >> volume loss, chronic small vessel ischemic changes, 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 Started on zosyn overnight Patient remains pleasantly confused this morning Wife at the bedside  Objective   BP (!) 144/67   Pulse 94   Temp 97.7 F (36.5 C) (Oral)   Resp (!) 23   SpO2 100%   I/O last 3 completed shifts: In: 3017.6 [I.V.:2950.4; IV Piggyback:67.2] Out: 2175  [Urine:2175]  Examination:  General - somnolent HEENT: [place]/AT, moist mucous membranes, sclera anicteric Cardiac - irregular, tachycardic Chest - wheezing present Abdomen - soft, non tender, + bowel sounds Extremities - no cyanosis, clubbing, or edema Skin - faint red rash over upper neck and chest area Neuro - awake, alert to person but not place and time, pleasantly confused, moving all extremities  Resolved Hospital Problem list     Assessment & Plan:   Fever with leukocytosis. - main concern is for infection, but source not clear - started on zosyn 12/24. Will add azithromycin for atypical coverage today. - initial chest xray and repeat x-ray last night and urinalysis negative - CT abdomen without etiology of infectious source. Gallbladder has been removed. - on chronic prednisone for hx of rheumatoid arthritis so he could have atypical findings for infectious process - blood cultures from 12/23 no growth to date - Respiratory viral panel negative, LP negative for infection, MRSA screen negative - f/u doppler legs - Check LDH and fungitell - TSH normal, LFTs within normal limits  New onset A fib with RVR. Hx of HTN, HLD. - Continue cardizem gtt - continue pravastatin - goal blood pressure normotensive - CHADS2 Score 2, 4% annual stroke risk. Holding anticoagulation at this time.  Thrush. - started clotrimazole troche   Altered mental status. Hx of macroadenoma with chronic headache. Hx of anxiety with depression. - neuroimaging negative for acute findings - LP negative - d/c haldol  AKI CKD 3b. - f/u BMET  Hx of  rheumatoid arthritis, gout. - continue prednisone, allopurinol  Hx of GERD, PUD. - continue protonix  Dysphagia. - f/u with speech therapy  Best Practice (right click and "Reselect all SmartList Selections" daily)   Diet/type: dysphagia diet (see orders) DVT prophylaxis: SCD GI prophylaxis: PPI Lines: N/A Foley:  N/A Code Status:  DNR Last  date of multidisciplinary goals of care discussion '[x]'$   Labs   CBC: Recent Labs  Lab 08/26/22 1004 08/26/22 1009 08/27/22 1041 08/28/22 0428 08/29/22 0344  WBC 12.8*  --  14.7* 18.7* 14.9*  NEUTROABS 8.4*  --   --   --   --   HGB 12.0* 13.9 14.7 14.0 12.8*  HCT 35.0* 41.0 44.1 42.2 38.6*  MCV 90.4  --  90.2 90.8 90.4  PLT 182  --  226 196 211    Basic Metabolic Panel: Recent Labs  Lab 08/26/22 1004 08/26/22 1009 08/27/22 1041 08/28/22 0428 08/29/22 0344  NA 138 140 136 138 141  K 4.0 4.1 3.7 3.8 3.6  CL 104 104 103 104 108  CO2 24  --  '22 22 22  '$ GLUCOSE 143* 140* 116* 108* 114*  BUN 29* 37* 28* 27* 28*  CREATININE 1.81* 1.70* 1.80* 1.96* 1.63*  CALCIUM 8.4*  --  8.8* 8.9 8.4*   GFR: Estimated Creatinine Clearance: 36.7 mL/min (A) (by C-G formula based on SCr of 1.63 mg/dL (H)). Recent Labs  Lab 08/26/22 1004 08/27/22 1041 08/28/22 0428 08/29/22 0344  WBC 12.8* 14.7* 18.7* 14.9*    Liver Function Tests: Recent Labs  Lab 08/26/22 1004 08/29/22 0344  AST 29 35  ALT 19 20  ALKPHOS 57 54  BILITOT 0.8 1.0  PROT 6.3* 6.1*  ALBUMIN 3.2* 2.8*   No results for input(s): "LIPASE", "AMYLASE" in the last 168 hours. No results for input(s): "AMMONIA" in the last 168 hours.  ABG    Component Value Date/Time   TCO2 26 08/26/2022 1009     Coagulation Profile: Recent Labs  Lab 08/26/22 1004  INR 1.2    Cardiac Enzymes: No results for input(s): "CKTOTAL", "CKMB", "CKMBINDEX", "TROPONINI" in the last 168 hours.  HbA1C: Hgb A1c MFr Bld  Date/Time Value Ref Range Status  08/26/2022 04:46 PM 5.8 (H) 4.8 - 5.6 % Final    Comment:    (NOTE)         Prediabetes: 5.7 - 6.4         Diabetes: >6.4         Glycemic control for adults with diabetes: <7.0     CBG: Recent Labs  Lab 08/26/22 1001  GLUCAP 165*    Signature:   Freda Jackson, MD [facility] Office: [phone]   See Amion for personal pager PCCM on call  pager [phone] until 7pm. Please call Elink 7p-7a. [phone]

## 2022-08-30 ENCOUNTER — Telehealth: Payer: Self-pay

## 2022-08-30 DIAGNOSIS — R4182 Altered mental status, unspecified: Secondary | ICD-10-CM | POA: Diagnosis not present

## 2022-08-30 DIAGNOSIS — R2990 Unspecified symptoms and signs involving the nervous system: Secondary | ICD-10-CM | POA: Diagnosis not present

## 2022-08-30 DIAGNOSIS — I639 Cerebral infarction, unspecified: Secondary | ICD-10-CM | POA: Diagnosis not present

## 2022-08-30 LAB — CBC
HCT: 36.9 % — ABNORMAL LOW (ref 39.0–52.0)
Hemoglobin: 12.6 g/dL — ABNORMAL LOW (ref 13.0–17.0)
MCH: 30.7 pg (ref 26.0–34.0)
MCHC: 34.1 g/dL (ref 30.0–36.0)
MCV: 90 fL (ref 80.0–100.0)
Platelets: 189 10*3/uL (ref 150–400)
RBC: 4.1 MIL/uL — ABNORMAL LOW (ref 4.22–5.81)
RDW: 14 % (ref 11.5–15.5)
WBC: 10.3 10*3/uL (ref 4.0–10.5)
nRBC: 0 % (ref 0.0–0.2)

## 2022-08-30 LAB — BASIC METABOLIC PANEL
Anion gap: 9 (ref 5–15)
BUN: 29 mg/dL — ABNORMAL HIGH (ref 8–23)
CO2: 25 mmol/L (ref 22–32)
Calcium: 8.6 mg/dL — ABNORMAL LOW (ref 8.9–10.3)
Chloride: 108 mmol/L (ref 98–111)
Creatinine, Ser: 1.97 mg/dL — ABNORMAL HIGH (ref 0.61–1.24)
GFR, Estimated: 34 mL/min — ABNORMAL LOW (ref >60)
Glucose, Bld: 103 mg/dL — ABNORMAL HIGH (ref 70–99)
Potassium: 3.5 mmol/L (ref 3.5–5.1)
Sodium: 142 mmol/L (ref 135–145)

## 2022-08-30 MED ORDER — PANTOPRAZOLE SODIUM 40 MG IV SOLR
40.0000 mg | INTRAVENOUS | Status: DC
  Administered 2022-08-30: 40 mg via INTRAVENOUS
  Filled 2022-08-30: qty 10

## 2022-08-30 MED ORDER — ENSURE ENLIVE PO LIQD
237.0000 mL | Freq: Two times a day (BID) | ORAL | Status: DC
  Administered 2022-08-31 – 2022-09-01 (×2): 237 mL via ORAL

## 2022-08-30 MED ORDER — POTASSIUM CHLORIDE CRYS ER 20 MEQ PO TBCR
40.0000 meq | EXTENDED_RELEASE_TABLET | Freq: Once | ORAL | Status: AC
  Administered 2022-08-30: 40 meq via ORAL
  Filled 2022-08-30: qty 2

## 2022-08-30 NOTE — Progress Notes (Addendum)
STROKE TEAM PROGRESS NOTE   INTERVAL HISTORY Patient seen at bedside with wife, son present in room. He is more alert today, not oriented to year, situation.  Vitals:   08/30/22 0615 08/30/22 0630 08/30/22 0700 08/30/22 0800  BP: 126/70 100/72 96/62 117/61  Pulse: 90 90 89 96  Resp: (!) 23 20 (!) 21 (!) 23  Temp:      TempSrc:      SpO2: 100% 100% 100% 99%   CBC:  Recent Labs  Lab 08/26/22 1004 08/26/22 1009 08/29/22 0344 08/30/22 0516  WBC 12.8*   < > 14.9* 10.3  NEUTROABS 8.4*  --   --   --   HGB 12.0*   < > 12.8* 12.6*  HCT 35.0*   < > 38.6* 36.9*  MCV 90.4   < > 90.4 90.0  PLT 182   < > 178 189   < > = values in this interval not displayed.    Basic Metabolic Panel:  Recent Labs  Lab 08/29/22 0344 08/30/22 0516  NA 141 142  K 3.6 3.5  CL 108 108  CO2 22 25  GLUCOSE 114* 103*  BUN 28* 29*  CREATININE 1.63* 1.97*  CALCIUM 8.4* 8.6*    Lipid Panel:  Recent Labs  Lab 08/27/22 0311  CHOL 138  TRIG 188*  HDL 27*  CHOLHDL 5.1  VLDL 38  LDLCALC 73    HgbA1c:  Recent Labs  Lab 08/26/22 1646  HGBA1C 5.8*    Urine Drug Screen:  Recent Labs  Lab 08/26/22 1002  LABOPIA NONE DETECTED  COCAINSCRNUR NONE DETECTED  LABBENZ NONE DETECTED  AMPHETMU NONE DETECTED  THCU NONE DETECTED  LABBARB NONE DETECTED     Alcohol Level  Recent Labs  Lab 08/26/22 1004  ETH <10     IMAGING past 24 hours VAS [place] LOWER EXTREMITY VENOUS (DVT)  Result Date: 08/30/2022  Lower Venous DVT Study Patient Name:  [name]  Date of Exam:   08/29/2022 Medical Rec #: [id number]        Accession #:    [id number] Date of Birth: [birth year]         Patient Gender: M Patient Age:   76 years Exam Location:  [facility] Procedure:      VAS [place] LOWER EXTREMITY VENOUS (DVT) Referring Phys: Hillery Aldo --------------------------------------------------------------------------------  Indications: Leukocytosis.  Limitations: Continuous patient movement - Altered mental  status. Comparison Study: No previous exams Performing Technologist: Jody Hill RVT, RDMS  Examination Guidelines: A complete evaluation includes B-mode imaging, spectral Doppler, color Doppler, and power Doppler as needed of all accessible portions of each vessel. Bilateral testing is considered an integral part of a complete examination. Limited examinations for reoccurring indications may be performed as noted. The reflux portion of the exam is performed with the patient in reverse Trendelenburg.  +---------+---------------+---------+-----------+----------+--------------+ RIGHT    CompressibilityPhasicitySpontaneityPropertiesThrombus Aging +---------+---------------+---------+-----------+----------+--------------+ CFV      Full           Yes      Yes                                 +---------+---------------+---------+-----------+----------+--------------+ SFJ      Full                                                        +---------+---------------+---------+-----------+----------+--------------+  FV Prox  Full           Yes      Yes                                 +---------+---------------+---------+-----------+----------+--------------+ FV Mid   Full           Yes      Yes                                 +---------+---------------+---------+-----------+----------+--------------+ FV DistalFull           Yes      Yes                                 +---------+---------------+---------+-----------+----------+--------------+ PFV      Full                                                        +---------+---------------+---------+-----------+----------+--------------+ POP      Full           Yes      Yes                                 +---------+---------------+---------+-----------+----------+--------------+ PTV      Full                                                        +---------+---------------+---------+-----------+----------+--------------+  PERO     Full                                                        +---------+---------------+---------+-----------+----------+--------------+   +---------+---------------+---------+-----------+----------+--------------+ LEFT     CompressibilityPhasicitySpontaneityPropertiesThrombus Aging +---------+---------------+---------+-----------+----------+--------------+ CFV      Full           Yes      Yes                                 +---------+---------------+---------+-----------+----------+--------------+ SFJ      Full                                                        +---------+---------------+---------+-----------+----------+--------------+ FV Prox  Full           Yes      Yes                                 +---------+---------------+---------+-----------+----------+--------------+ FV Mid   Full  Yes      Yes                                 +---------+---------------+---------+-----------+----------+--------------+ FV DistalFull           Yes      Yes                                 +---------+---------------+---------+-----------+----------+--------------+ PFV      Full                                                        +---------+---------------+---------+-----------+----------+--------------+ POP      Full           Yes      Yes                                 +---------+---------------+---------+-----------+----------+--------------+ PTV      Full                                                        +---------+---------------+---------+-----------+----------+--------------+ PERO     Full                                                        +---------+---------------+---------+-----------+----------+--------------+     Summary: BILATERAL: - No evidence of deep vein thrombosis seen in the lower extremities, bilaterally. -No evidence of popliteal cyst, bilaterally.   *See table(s) above for measurements and  observations. Electronically signed by Deitra Mayo MD on 08/30/2022 at 4:31:11 AM.    Final    CT Angio Chest Pulmonary Embolism (PE) W or WO Contrast  Result Date: 08/29/2022 CLINICAL DATA:  Clinical suspicion for pulmonary embolism. History from the previous day's chest radiograph describes aspiration. EXAM: CT ANGIOGRAPHY CHEST WITH CONTRAST TECHNIQUE: Multidetector CT imaging of the chest was performed using the standard protocol during bolus administration of intravenous contrast. Multiplanar CT image reconstructions and MIPs were obtained to evaluate the vascular anatomy. RADIATION DOSE REDUCTION: This exam was performed according to the departmental dose-optimization program which includes automated exposure control, adjustment of the mA and/or kV according to patient size and/or use of iterative reconstruction technique. CONTRAST:  38m OMNIPAQUE IOHEXOL 350 MG/ML SOLN COMPARISON:  Chest radiograph, 08/28/2022 and older exams. FINDINGS: Cardiovascular: Pulmonary arteries are well opacified. There is no evidence of a pulmonary embolism. Heart normal in size and configuration. Three-vessel coronary artery calcifications. No pericardial effusion. Great vessels are normal in caliber. Aorta not opacified. Mild aortic atherosclerotic calcifications. Mediastinum/Nodes: No neck base, mediastinal or hilar masses. No enlarged lymph nodes. Trachea somewhat flattened consistent with an extra tori exam. Otherwise unremarkable. Normal esophagus. Lungs/Pleura: Mild linear and dependent opacities in the lower lobes consistent with atelectasis. No evidence of pneumonia or aspiration pneumonitis. No pulmonary edema. No lung  mass or nodule. No pleural effusion or pneumothorax. Upper Abdomen: No acute abnormality. Musculoskeletal: No fracture or acute finding. No bone lesion. No chest wall mass. Review of the MIP images confirms the above findings. IMPRESSION: 1. No acute findings. No evidence of pneumonia or  aspiration pneumonitis. 2. Coronary artery calcifications and aortic atherosclerosis. Aortic Atherosclerosis (ICD10-I70.0). Electronically Signed   By: Lajean Manes M.D.   On: 08/29/2022 18:04    PHYSICAL EXAM General: in no acute distress HEENT: normocephalic and atraumatic Cardiovascular: tachycardic and irregular Respiratory: normal respiratory effort and on 4 L nasal cannula Gastrointestinal: non-tender and non-distended Extremities: moving all extremities spontaneously  Neuro - patient alert and oriented x3. He was also able to name his wife and son. . Following commands appropriately. Moving all extremities spontaneously. Notable generalized weakness throughout.  ASSESSMENT/PLAN [name] is a 76 y.o. male with PMH significant for CKD3, HTN, RA, HLD, GERD, pituitary macroadenoma and headaches who presents as a code stroke for aphasia and mild R sided weakness. He complained of abdominal pain and went to bathroom at [employer] and found leaning against the wall confused. Given TNK on admission. Presentation c/f stroke vs post-ictal state.  Encephalopathy, improving Confusion and delirium and drowsiness Etiology not clear, could be due to fever vs. ? Post-ictal state EEG no seizure Mental status improving, continue monitor  AMS - ? Post-ictal state vs. ? TIA Code Stroke CT Head No evidence of acute intracranial hemorrhage or acute infarct. Known pituitary macroadenoma, incompletely assessed on this non-contrast head CT, but grossly unchanged from the prior brain MRI of 04/06/2022.Advanced chronic small vessel image changes within the cerebral white matter. Mild generalized cerebral atrophy. ASPECTS 10. CTA head & neck shows no emergent large vessel occlusion. No hemodynamically significant stenosis in the neck. Redemonstrated pituitary macroadenoma, which invades the right cavernous sinus. CT repeat no acute finding MRI showed no acute intracranial pathology. Stable known  pituitary macro adenoma. 2D Echo EF 60-65% CTPA No acute findings. No evidence of pneumonia or aspiration pneumonitis. Coronary artery calcifications and aortic atherosclerosis. LE VAS [place] no evidence of deep vein thrombosis seen in the lower extremities,  bilaterally.  No evidence of popliteal cyst, bilaterally.  LDL 73 HgbA1c 5.8 VTE prophylaxis - SCDs No antithrombotic prior to admission, now on No antithrombotic pending diagnosis EEG mild diffuse slowing indicative of global cerebral dysfunction  Therapy recommendations:  CIR Disposition:  pending   Afib with RVR Aflutter  Not clear if this is isolated events due to fever and infectious process Will continue to monitor If persistent > 48 hours, may need to consider anticoagulation Off Cardizem IV Repeat EKG sinus tachy  Hypertension Home meds: amlodipine, losartan. Held on admission. Stable Long-term BP goal normotensive  Hyperlipidemia Home meds: pravastatin 40 mg, held hospital LDL 73, goal < 70 On home pravastatin Continue statin on ischarge  Fever with leukocytosis, improved WBC 12.8->14.7->18.7->14.9->10.3 Tmax 103->102.2->afebrile UA neg CXR neg Blood culture NGTD CSF unremarkable for infection continue to trend WBC, fever curve On zosyn and azithromycin for empiric treatment  AKI Cr 1.96 > 1.63 > 1.97 IVF decrease from 100 mL to 75 mL -> off S/p 1 dose furosemide 40 mg given wheezing on exam  Sundowning  Delirium at evenings On seroquel Qhs Off precedex    Other Stroke Risk Factors Advanced Age >/= 55   Other Active Problems Gout and RA Continue home etanercept and allopurinol and prednisone. GERD: Continue home pantoprazole: Insomnia Continue home trazodone. Anxiety and depression Continue escitalopram  Agitation On dexmedetomidine  Hospital day # 4  ATTENDING NOTE: I reviewed above note and agree with the assessment and plan. Pt was seen and examined.   Pt wife and son are at the  bedside. Pt sitting in chair, still drowsy but more awake alert than yesterday, able to engage with conversation, orientated to place, month, people but had year wrong. Able to follow all simple commands, paucity of speech but no aphasia, answer questions appropriately. Moving all extremities. Afebrile. On seroquel Qhs and precedex overnight. Now off precedexa. Afib has resolved, now sinus tachy, off cardizem, likely isolated afib. Hopefully his mental status improving over time. He has no HA now, but when he has HA, it was bi-frontal behind eyes, concerning for macroadenoma from pituitary. Has appointment with ENT on 1/2 and NSG 1/25 for resection.  Will plan to d/c this week if possible.  For detailed assessment and plan, please refer to above/below as I have made changes wherever appropriate.   Rosalin Hawking, MD PhD Stroke Neurology 08/30/2022 10:27 PM  This patient is critically ill due to AMS, stroke like symptoms, isolated afib, fever and at significant risk of neurological worsening, death form sepsis, seizure. This patient's care requires constant monitoring of vital signs, hemodynamics, respiratory and cardiac monitoring, review of multiple databases, neurological assessment, discussion with family, other specialists and medical decision making of high complexity. I spent 30 minutes of neurocritical care time in the care of this patient.    To contact Stroke Continuity provider, please refer to [email or web address] After hours, contact General Neurology

## 2022-08-30 NOTE — Progress Notes (Signed)
  Transition of Care [facility]) Screening Note   Patient Details  Name: [name] Date of Birth: [birth year]   Transition of Care [facility]) CM/SW Contact:    Benard Halsted, LCSW Phone Number: 08/30/2022, 4:16 PM    Transition of Care Department [facility]) has reviewed patient and will follow for CIR determination. We will continue to monitor patient advancement through interdisciplinary progression rounds. If new patient transition needs arise, please place a TOC consult.

## 2022-08-30 NOTE — Telephone Encounter (Signed)
Referring office called in regarding patients missed appointment on 12/22. Patient was on his way to the appointment at our office when he collapsed. They re-routed to the ED and he is currently admitted. Patients wife states she called and spoke with someone at our office regarding this.

## 2022-08-30 NOTE — Progress Notes (Signed)
   NAME:  [name], MRN:  [id number], DOB:  July 05, 1946, LOS: 4 ADMISSION DATE:  08/26/2022, CONSULTATION DATE:  12/24 REFERRING MD:  Leonel Ramsay, CHIEF COMPLAINT:  Confusion, fever   History of Present Illness:  76 y/o male admitted by neurology on 12/24 in the setting of confusion and fever, PCCM consulted to assist with medical management.    Pertinent  Medical History  CKD 3, Rheumatoid arthritis, Anxiety and depression, Back pain, GERD, Gout, Stomach ulcer, HLD, HTN, Diverticulitis, Macroadenoma with chronic headache   Significant Hospital Events: Including procedures, antibiotic start and stop dates in addition to other pertinent events   12/22 present to [facility], 12/22 RSV PCR neg flu, covid, rsv 12/23 fever 12/24 persistent fever, new onset A fib with RVR; started zosyn 12/24 LP, CSF> 1 WBC, 27 RBC, glu normal, CSF PCR Panel neg, crypto panel negative CT abd/pelvis 08/26/22 >> s/p cholecystectomy, b/l renal cysts, s/p appendectomy, sigmoid diverticulosis, aortic atherosclerosis Echo 08/26/22 >> EF 60 to 65% MRI brain 08/28/22 >> volume loss, chronic small vessel ischemic changes, 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 rate 20, SpO2 100 %.        Intake/Output Summary (Last 24 hours) at 08/30/2022 0719 Last data filed at 08/30/2022 0600 Gross per 24 hour  Intake 1296.47 ml  Output 2650 ml  Net -1353.53 ml   There were no vitals filed for this visit.  Examination:  Gen: Chronically ill appearing HENT: OP clear, neck supple PULM: CTA B, normal effort  CV: RRR, no mgr GI: BS+, soft, nontender Derm: multiple tattoos, no rash Neuro: sedated  on precedex but answers questions, thinks he is at home    Resolved Hospital Problem list     Assessment & Plan:  Fever in immunocompromised patient Continue zosyn/azithro for now Consider stopping all antibiotics if blood cultures negative F/u cultures (CSF, blood)  New onset Atrial fibrillation with RVR > now in sinus rhythm Hypertension, hyperlipidemia Tele Monitor hemodynamics  Thrush Monitor exams Mycelex prn  Confusion, acute encephalopathy of undetermined etiology Minimize sedation Wean off precedex Lights on now Discontinue restraints Verbally redirect as able, ask family to help Seroquel lexapro  AKI CKD 3b Monitor BMET and UOP Replace electrolytes as needed  Rheumatoid arthritis, gout Continue prednisone for now Continue allopurinol  GERD, PUD Pantoprazole   Dysphagia D3   Best Practice (right click and "Reselect all SmartList Selections" daily)   Diet/type: dysphagia diet (see orders) DVT prophylaxis: prophylactic heparin  GI prophylaxis: PPI Lines: N/A Foley:  N/A Code Status:  DNR Last date of multidisciplinary goals of care discussion [per primary]  Critical care time: n/a    Roselie Awkward, MD [place] PCCM Pager: [phone] Cell: [phone] After 7:00 pm call Elink  [phone]

## 2022-08-30 NOTE — Progress Notes (Signed)
Inpatient Rehab Admissions Coordinator:  ? ?Per therapy recommendations,  patient was screened for CIR candidacy by Jalonda Antigua, MS, CCC-SLP. At this time, Pt. Appears to be a a potential candidate for CIR. I will place   order for rehab consult per protocol for full assessment. Please contact me any with questions. ? ?Christian Isenhower, MS, CCC-SLP ?Rehab Admissions Coordinator  ?[phone] (celll) ?[phone] (office) ? ?

## 2022-08-30 NOTE — Progress Notes (Signed)
Physical Therapy Treatment Patient Details Name: [name] MRN: [id number] DOB: October 31, 1945 Today's Date: 08/30/2022   History of Present Illness 76 y.o. male admitted 08/26/22 with aphasia and R side weakness. MRI pending. CT (-). 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    PT Comments    Per RN and chart pt has episode of "sundowning" yesterday and became very agitated requiring bilat mittens, posey belt, and sedative medicine. Pt initially lethargic but was pleasant, cooperative, and participatory. Pt became more alert as we started moving and was able to keep eyes open. Pt remains to be very deconditioned with impaired balance, is not oriented, has poor memory, and is at increased falls risk. Pt did require less physical assist for transfer to chair today compared to initial eval. Acute PT to cont to follow.    Recommendations for follow up therapy are one component of a multi-disciplinary discharge planning process, led by the attending physician.  Recommendations may be updated based on patient status, additional functional criteria and insurance authorization.  Follow Up Recommendations  Acute inpatient rehab (3hours/day)     Assistance Recommended at Discharge Frequent or constant Supervision/Assistance  Patient can return home with the following Two people to help with walking and/or transfers;A lot of help with bathing/dressing/bathroom;Assistance with cooking/housework;Direct supervision/assist for medications management;Direct supervision/assist for financial management;Assist for transportation;Help with stairs or ramp for entrance   Equipment Recommendations  Other (comment) (TBA)    Recommendations for Other Services Rehab consult     Precautions / Restrictions Precautions Precautions: Fall Precaution Comments: bil mittens, posey belt, SBP < 180 Restrictions Weight Bearing Restrictions: No      Mobility  Bed Mobility Overal bed mobility: Needs Assistance Bed Mobility: Supine to Sit     Supine to sit: Mod assist     General bed mobility comments: HOB elevated, max directional verbal cues, pt initiated movement of LEs off EOB and reached for PT with UE, modA for trunk elevation and to scoot to EOB    Transfers Overall transfer level: Needs assistance Equipment used: None (face to face transfer with gait belt) Transfers: Sit to/from Stand, Bed to chair/wheelchair/BSC Sit to Stand: Max assist (+2 beneficial)   Step pivot transfers: Max assist       General transfer comment: max verbal cues, pt initially with trunk and knees in flexed position with verbal cues was able to achieve full upright posture, pt took 5 steps to chair wiht maxA for weight shifting and directional verbal cues, pt with a harder time advancing R LE compared to L LE    Ambulation/Gait               General Gait Details: unable   Stairs             Wheelchair Mobility    Modified Rankin (Stroke Patients Only)       Balance Overall balance assessment: Needs assistance Sitting-balance support: No upper extremity supported, Feet supported Sitting balance-Leahy Scale: Fair Sitting balance - Comments: pt close min guard, with onset of fatigue while sitting EOB pt would start to lean backwards Postural control: Posterior lean Standing balance support: No upper extremity supported, During functional activity, Reliant on assistive device for balance Standing balance-Leahy Scale: Zero Standing balance comment: maxA to maintain upright posture                            Cognition Arousal/Alertness:  Lethargic, Suspect due to medications Behavior During Therapy: Flat affect Overall Cognitive Status: Difficult to assess                                 General Comments: pt coming of sedation from last night due to extreme agitation/irritation starting  yesterday afternoon into the evening. Pt was unaware of date, place, situation, did recall being in the hospital s/p reorientation when asked again at end of session. Pt able to state name and birthdate, recognized son and wife, pt more alert/awake upon sitting up and getting to chair, while supine in bed pt had a hard time keeping eyes open        Exercises      General Comments General comments (skin integrity, edema, etc.): VSS      Pertinent Vitals/Pain Pain Assessment Pain Assessment: No/denies pain    Home Living                          Prior Function            PT Goals (current goals can now be found in the care plan section) Acute Rehab PT Goals PT Goal Formulation: With patient/family Time For Goal Achievement: 09/10/22 Potential to Achieve Goals: Good Progress towards PT goals: Progressing toward goals    Frequency    Min 4X/week      PT Plan Current plan remains appropriate    Co-evaluation              AM-PAC PT "6 Clicks" Mobility   Outcome Measure  Help needed turning from your back to your side while in a flat bed without using bedrails?: A Lot Help needed moving from lying on your back to sitting on the side of a flat bed without using bedrails?: A Lot Help needed moving to and from a bed to a chair (including a wheelchair)?: Total Help needed standing up from a chair using your arms (e.g., wheelchair or bedside chair)?: Total Help needed to walk in hospital room?: Total Help needed climbing 3-5 steps with a railing? : Total 6 Click Score: 8    End of Session Equipment Utilized During Treatment: Gait belt;Oxygen Activity Tolerance: Patient tolerated treatment well Patient left: with call bell/phone within reach;with family/visitor present;in chair;with chair alarm set;with restraints reapplied (posey belt in place, mittens left off as pt not agitated at this time and family in room, discussed with RN who agreed) Nurse  Communication: Mobility status PT Visit Diagnosis: Unsteadiness on feet (R26.81);Muscle weakness (generalized) (M62.81);Difficulty in walking, not elsewhere classified (R26.2)     Time: 2202-5427 PT Time Calculation (min) (ACUTE ONLY): 29 min  Charges:  $Therapeutic Activity: 8-22 mins $Neuromuscular Re-education: 8-22 mins                     Kittie Plater, PT, DPT Acute Rehabilitation Services Secure chat preferred Office #: [phone]    Berline Lopes 08/30/2022, 11:14 AM

## 2022-08-31 DIAGNOSIS — R2990 Unspecified symptoms and signs involving the nervous system: Secondary | ICD-10-CM | POA: Diagnosis not present

## 2022-08-31 DIAGNOSIS — R4182 Altered mental status, unspecified: Secondary | ICD-10-CM | POA: Diagnosis not present

## 2022-08-31 LAB — CSF CULTURE W GRAM STAIN: Culture: NO GROWTH

## 2022-08-31 LAB — LEGIONELLA PNEUMOPHILA SEROGP 1 UR AG: L. pneumophila Serogp 1 Ur Ag: NEGATIVE

## 2022-08-31 LAB — CBC
HCT: 37.3 % — ABNORMAL LOW (ref 39.0–52.0)
Hemoglobin: 12.9 g/dL — ABNORMAL LOW (ref 13.0–17.0)
MCH: 30.8 pg (ref 26.0–34.0)
MCHC: 34.6 g/dL (ref 30.0–36.0)
MCV: 89 fL (ref 80.0–100.0)
Platelets: 218 10*3/uL (ref 150–400)
RBC: 4.19 MIL/uL — ABNORMAL LOW (ref 4.22–5.81)
RDW: 13.8 % (ref 11.5–15.5)
WBC: 8.5 10*3/uL (ref 4.0–10.5)
nRBC: 0 % (ref 0.0–0.2)

## 2022-08-31 LAB — BASIC METABOLIC PANEL
Anion gap: 9 (ref 5–15)
BUN: 34 mg/dL — ABNORMAL HIGH (ref 8–23)
CO2: 24 mmol/L (ref 22–32)
Calcium: 8.6 mg/dL — ABNORMAL LOW (ref 8.9–10.3)
Chloride: 107 mmol/L (ref 98–111)
Creatinine, Ser: 2 mg/dL — ABNORMAL HIGH (ref 0.61–1.24)
GFR, Estimated: 34 mL/min — ABNORMAL LOW (ref >60)
Glucose, Bld: 85 mg/dL (ref 70–99)
Potassium: 3.7 mmol/L (ref 3.5–5.1)
Sodium: 140 mmol/L (ref 135–145)

## 2022-08-31 LAB — VDRL, CSF: VDRL Quant, CSF: NONREACTIVE

## 2022-08-31 MED ORDER — PROPRANOLOL HCL ER 60 MG PO CP24
60.0000 mg | ORAL_CAPSULE | Freq: Every day | ORAL | Status: DC
  Administered 2022-08-31 – 2022-09-01 (×2): 60 mg via ORAL
  Filled 2022-08-31 (×2): qty 1

## 2022-08-31 MED ORDER — ASPIRIN 81 MG PO TBEC
81.0000 mg | DELAYED_RELEASE_TABLET | Freq: Every day | ORAL | Status: DC
  Administered 2022-09-01: 81 mg via ORAL
  Filled 2022-08-31: qty 1

## 2022-08-31 MED ORDER — POTASSIUM CHLORIDE CRYS ER 20 MEQ PO TBCR
40.0000 meq | EXTENDED_RELEASE_TABLET | Freq: Once | ORAL | Status: AC
  Administered 2022-08-31: 40 meq via ORAL
  Filled 2022-08-31: qty 2

## 2022-08-31 MED ORDER — PANTOPRAZOLE SODIUM 40 MG PO TBEC
40.0000 mg | DELAYED_RELEASE_TABLET | Freq: Every day | ORAL | Status: DC
  Administered 2022-08-31: 40 mg via ORAL
  Filled 2022-08-31: qty 1

## 2022-08-31 NOTE — Progress Notes (Signed)
Pt admitted to 3w7 from 4N.  Alert and oriented.  Denies any pain. Telemetry placed on patient and CCMD called with 2nd verification.  Verbalizes understanding to call before attempting to get OOB.  Call bell within reach and bed alarm set.

## 2022-08-31 NOTE — Progress Notes (Signed)
Inpatient Rehab Coordinator Note:  I spoke with patient's spouse over the phone to discuss CIR recommendations.  She reports that patient is doing much better today and feels he may be doing well enough to d/c home with 24/7 support from a large family.  I've reached out to PT to assess today with that in mind.  If they determine to d/c home when medically ready I will sign off.  Otherwise I will touch base with her again later today or tomorrow.   Shann Medal, PT, DPT Admissions Coordinator [phone] 08/31/22  11:07 AM

## 2022-08-31 NOTE — Progress Notes (Addendum)
STROKE TEAM PROGRESS NOTE   INTERVAL HISTORY Patient seen at bedside with wife, son present in room. He is significantly more alert today, smiling and in a terrific mood. He was oriented x4. He complains of a headache in the middle of his head that is not bothering him too much today. No focal neurologic deficits. Patient eligible to move out of ICU today.  Vitals:   08/31/22 0500 08/31/22 0600 08/31/22 0700 08/31/22 0800  BP:   139/86   Pulse: (!) 112 (!) 119 (!) 119   Resp: 20 (!) 24 20   Temp:    97.8 F (36.6 C)  TempSrc:    Axillary  SpO2: 96% 100% 100%    CBC:  Recent Labs  Lab 08/26/22 1004 08/26/22 1009 08/30/22 0516 08/31/22 0423  WBC 12.8*   < > 10.3 8.5  NEUTROABS 8.4*  --   --   --   HGB 12.0*   < > 12.6* 12.9*  HCT 35.0*   < > 36.9* 37.3*  MCV 90.4   < > 90.0 89.0  PLT 182   < > 189 218   < > = values in this interval not displayed.    Basic Metabolic Panel:  Recent Labs  Lab 08/30/22 0516 08/31/22 0423  NA 142 140  K 3.5 3.7  CL 108 107  CO2 25 24  GLUCOSE 103* 85  BUN 29* 34*  CREATININE 1.97* 2.00*  CALCIUM 8.6* 8.6*    Lipid Panel:  Recent Labs  Lab 08/27/22 0311  CHOL 138  TRIG 188*  HDL 27*  CHOLHDL 5.1  VLDL 38  LDLCALC 73    HgbA1c:  Recent Labs  Lab 08/26/22 1646  HGBA1C 5.8*    Urine Drug Screen:  Recent Labs  Lab 08/26/22 1002  LABOPIA NONE DETECTED  COCAINSCRNUR NONE DETECTED  LABBENZ NONE DETECTED  AMPHETMU NONE DETECTED  THCU NONE DETECTED  LABBARB NONE DETECTED     Alcohol Level  Recent Labs  Lab 08/26/22 1004  ETH <10     IMAGING past 24 hours No results found.  PHYSICAL EXAM General: in no acute distress HEENT: normocephalic and atraumatic Cardiovascular: tachycardic and irregular Respiratory: normal respiratory effort and on 4 L nasal cannula Gastrointestinal: non-tender and non-distended Extremities: moving all extremities spontaneously  Neuro - patient alert and oriented x4. He was also  able to name his wife and son, situation, and year. Following commands appropriately. Moving all extremities spontaneously.  ASSESSMENT/PLAN [name] is a 76 y.o. male with PMH significant for CKD3, HTN, RA, HLD, GERD, pituitary macroadenoma and headaches who presents as a code stroke for aphasia and mild R sided weakness. He complained of abdominal pain and went to bathroom at [employer] and found leaning against the wall confused. Given TNK on admission. Presentation c/f stroke vs post-ictal state.  Encephalopathy, improving Confusion and delirium and drowsiness Etiology not clear, could be due to fever vs. ? Post-ictal state EEG no seizure Mental status improving, continue monitor  AMS - ? Post-ictal state vs. ? TIA Code Stroke CT Head No evidence of acute intracranial hemorrhage or acute infarct. Known pituitary macroadenoma, incompletely assessed on this non-contrast head CT, but grossly unchanged from the prior brain MRI of 04/06/2022.Advanced chronic small vessel image changes within the cerebral white matter. Mild generalized cerebral atrophy. ASPECTS 10. CTA head & neck shows no emergent large vessel occlusion. No hemodynamically significant stenosis in the neck. Redemonstrated pituitary macroadenoma, which invades the right cavernous sinus. CT  repeat no acute finding MRI showed no acute intracranial pathology. Stable known pituitary macro adenoma. 2D Echo EF 60-65% CTPA No acute findings. No evidence of pneumonia or aspiration pneumonitis. Coronary artery calcifications and aortic atherosclerosis. LE VAS [place] no evidence of deep vein thrombosis seen in the lower extremities,  bilaterally.  No evidence of popliteal cyst, bilaterally.  LDL 73 HgbA1c 5.8 VTE prophylaxis - SCDs No antithrombotic prior to admission, now on ASA 81 EEG mild diffuse slowing indicative of global cerebral dysfunction  Therapy recommendations:  HH PT Disposition:  pending   Afib with RVR Aflutter   Sinus tachycardia Likely isolated events due to fever and infectious process Will continue to monitor If persistent > 48 hours, may need to consider anticoagulation Off Cardizem IV Repeat EKG sinus tachy x 2 Now on propanolol   Hypertension Home meds: amlodipine, losartan. Held on admission. Stable Long-term BP goal normotensive  Hyperlipidemia Home meds: pravastatin 40 mg, held hospital LDL 73, goal < 70 On home pravastatin Continue statin on ischarge  Fever with leukocytosis, resolved WBC 12.8->14.7->18.7->14.9->10.3->8.5 Tmax 103->102.2->afebrile UA neg CXR neg Blood culture NGTD CSF unremarkable for infection continue to trend WBC, fever curve Was treated with zosyn and azithromycin  AKI Cr 1.96 > 1.63 > 1.97>2.0 IVF decrease from 100 mL to 75 mL -> off S/p 1 dose furosemide 40 mg given wheezing on exam  Sundowning  Delirium at evenings On seroquel Qhs Off precedex    Other Stroke Risk Factors Advanced Age >/= 70   Other Active Problems Gout and RA Continue home etanercept and allopurinol and prednisone. GERD: Continue home pantoprazole: Insomnia Continue home trazodone. Anxiety and depression Continue escitalopram Agitation On dexmedetomidine  Hospital day # 5   ATTENDING NOTE: I reviewed above note and agree with the assessment and plan. Pt was seen and examined.   Son and wife are at the bedside. Pt sitting in bed, AAO x 3, much improved mental status, near his baseline now. Afebrile and leukocytosis improved. No focal deficit. Only has sinus tachycardia, CCM stated propanolol. On Abx. PT/OT recommend HH. Transfer out of ICU  For detailed assessment and plan, please refer to above/below as I have made changes wherever appropriate.   Rosalin Hawking, MD PhD Stroke Neurology 08/31/2022 11:21 PM    To contact Stroke Continuity provider, please refer to [email or web address] After hours, contact General Neurology

## 2022-08-31 NOTE — Progress Notes (Signed)
Physical Therapy Treatment Patient Details Name: [name] MRN: [id number] DOB: 1946/07/07 Today's Date: 08/31/2022   History of Present Illness 76 y.o. male admitted 08/26/22 with aphasia and R side weakness. MRI (-). CT (-). 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    PT Comments    Pt reports feeling cognitively returned to baseline, pt's wife at bedside and in agreement. Pt demonstrating generalized weakness vs baseline given hospitalization, but improving. Pt ambulatory around the unit with use of RW, accepts min challenge to dynamic balance with increased time and use of RW. Overall pt requires close guard for safety, pt's wife expressing she can provide this at d/c home. PT updated recommendations to reflect home with HHPT, will continue to follow acutely.      Recommendations for follow up therapy are one component of a multi-disciplinary discharge planning process, led by the attending physician.  Recommendations may be updated based on patient status, additional functional criteria and insurance authorization.  Follow Up Recommendations  Home health PT     Assistance Recommended at Discharge Frequent or constant Supervision/Assistance  Patient can return home with the following Direct supervision/assist for medications management;Direct supervision/assist for financial management;Assist for transportation;Help with stairs or ramp for entrance;A little help with walking and/or transfers;A little help with bathing/dressing/bathroom   Equipment Recommendations  Other (comment) (TBA)    Recommendations for Other Services       Precautions / Restrictions Precautions Precautions: Fall Restrictions Weight Bearing Restrictions: No     Mobility  Bed Mobility Overal bed mobility: Needs Assistance Bed Mobility: Supine to Sit, Sit to Supine     Supine to sit: Supervision Sit to supine: Supervision         Transfers Overall transfer level: Needs assistance Equipment used: 1 person hand held assist Transfers: Sit to/from Stand Sit to Stand: Min guard           General transfer comment: close guard for safety, pt reaching for environment to self-steady.    Ambulation/Gait Ambulation/Gait assistance: Min guard Gait Distance (Feet): 250 Feet Assistive device: Rolling walker (2 wheels), IV Pole Gait Pattern/deviations: Step-through pattern, Decreased stride length, Narrow base of support Gait velocity: decr     General Gait Details: initially pt using single UE on IV pole, then resting both hands unsafely on IV pole. PT transitioned pt to RW, close guard for safety and cues for straight trajectory in hallway   Stairs             Wheelchair Mobility    Modified Rankin (Stroke Patients Only)       Balance Overall balance assessment: Needs assistance Sitting-balance support: No upper extremity supported, Feet supported Sitting balance-Leahy Scale: Good   Postural control: Posterior lean Standing balance support: During functional activity, Reliant on assistive device for balance, Single extremity supported Standing balance-Leahy Scale: Poor Standing balance comment: reliant on at least single UE support                            Cognition Arousal/Alertness: Awake/alert Behavior During Therapy: WFL for tasks assessed/performed Overall Cognitive Status: Within Functional Limits for tasks assessed                                 General Comments: Pt can be impulsive and witty, per pt's wife this is baseline  Exercises      General Comments General comments (skin integrity, edema, etc.): vss      Pertinent Vitals/Pain Pain Assessment Pain Assessment: Faces Faces Pain Scale: No hurt Pain Intervention(s): Monitored during session    Home Living                          Prior Function            PT  Goals (current goals can now be found in the care plan section) Acute Rehab PT Goals PT Goal Formulation: With patient/family Time For Goal Achievement: 09/10/22 Potential to Achieve Goals: Good Progress towards PT goals: Progressing toward goals    Frequency    Min 4X/week      PT Plan Current plan remains appropriate    Co-evaluation              AM-PAC PT "6 Clicks" Mobility   Outcome Measure  Help needed turning from your back to your side while in a flat bed without using bedrails?: A Little Help needed moving from lying on your back to sitting on the side of a flat bed without using bedrails?: A Little Help needed moving to and from a bed to a chair (including a wheelchair)?: A Little Help needed standing up from a chair using your arms (e.g., wheelchair or bedside chair)?: A Little Help needed to walk in hospital room?: A Little Help needed climbing 3-5 steps with a railing? : A Little 6 Click Score: 18    End of Session Equipment Utilized During Treatment: Gait belt Activity Tolerance: Patient tolerated treatment well Patient left: with call bell/phone within reach;with family/visitor present;in bed;Other (comment) (pt getting to EOB to use urinal, RN aware that bed alarm is not on for this reason and will address after pt is done) Nurse Communication: Mobility status PT Visit Diagnosis: Unsteadiness on feet (R26.81);Muscle weakness (generalized) (M62.81);Difficulty in walking, not elsewhere classified (R26.2)     Time: 2111-5520 PT Time Calculation (min) (ACUTE ONLY): 20 min  Charges:  $Therapeutic Activity: 8-22 mins                     Stacie Glaze, PT DPT Acute Rehabilitation Services Pager [phone]  Office [phone]   Roxine Caddy E Ruffin Pyo 08/31/2022, 2:26 PM

## 2022-08-31 NOTE — Progress Notes (Signed)
   NAME:  [name], MRN:  [id number], DOB:  1945-12-31, LOS: 5 ADMISSION DATE:  08/26/2022, CONSULTATION DATE:  12/24 REFERRING MD:  Leonel Ramsay, CHIEF COMPLAINT:  Confusion, fever   History of Present Illness:  76 y/o male admitted by neurology on 12/24 in the setting of confusion and fever, PCCM consulted to assist with medical management.    Pertinent  Medical History  CKD 3, Rheumatoid arthritis, Anxiety and depression, Back pain, GERD, Gout, Stomach ulcer, HLD, HTN, Diverticulitis, Macroadenoma with chronic headache   Significant Hospital Events: Including procedures, antibiotic start and stop dates in addition to other pertinent events   12/22 present to [facility], 12/22 RSV PCR neg flu, covid, rsv 12/23 fever 12/24 persistent fever, new onset A fib with RVR; started zosyn 12/24 LP, CSF> 1 WBC, 27 RBC, glu normal, CSF PCR Panel neg, crypto panel negative CT abd/pelvis 08/26/22 >> s/p cholecystectomy, b/l renal cysts, s/p appendectomy, sigmoid diverticulosis, aortic atherosclerosis Echo 08/26/22 >> EF 60 to 65% MRI brain 08/28/22 >> volume loss, chronic small vessel ischemic changes, 1.4 cm pituitary macroadenoma CT angio chest 12/25 > No PE, significant tracheobronchomalacia, cyst and trace pleural effusion left base 12/25 added azithro  Interim History / Subjective:   Off precedex Renal function the same Cultures remain negative  Objective   Blood pressure 139/86, pulse (!) 119, temperature 97.8 F (36.6 C), temperature source Axillary, resp. rate 20, SpO2 100 %.        Intake/Output Summary (Last 24 hours) at 08/31/2022 0929 Last data filed at 08/31/2022 0700 Gross per 24 hour  Intake 661.49 ml  Output 730 ml  Net -68.51 ml   There were no vitals filed for this visit.  Examination:  General:  Resting comfortably in bed HENT: NCAT OP clear PULM: CTA B, normal effort CV: RRR, no mgr GI: BS+, soft, nontender MSK: normal bulk and tone Neuro: awake, alert, no  distress, MAEW     Resolved Hospital Problem list     Assessment & Plan:  Fever in immunocompromised patient, no clear cause Stop antibiotics after 12/28 dose Monitor cultures  New onset Atrial fibrillation with RVR > now in sinus rhythm Hypertension, hyperlipidemia Tele Monitor hemodynamics Resume propranolol  Thrush Monitor exams D/c mycelex  Confusion, acute encephalopathy of undetermined etiology Minimize sedation Redirect as able Stop Seroquel Resume trazodone Hold tramadol lexapro  AKI, suspect contrast induced CKD 3b Monitor BMET and UOP Replace electrolytes as needed  Rheumatoid arthritis, gout Continue prednisone Continue allopurinol  GERD, PUD PPI  Dysphagia D3 diet   Recommend sending him out of ICU  PCCM will sign off   Best Practice (right click and "Reselect all SmartList Selections" daily)   Diet/type: dysphagia diet (see orders) DVT prophylaxis: prophylactic heparin  GI prophylaxis: PPI Lines: N/A Foley:  N/A Code Status:  DNR Last date of multidisciplinary goals of care discussion [per primary]  Critical care time: n/a    Roselie Awkward, MD [facility] Pager: [phone] Cell: [phone] After 7:00 pm call Elink  [phone]

## 2022-09-01 DIAGNOSIS — G9340 Encephalopathy, unspecified: Secondary | ICD-10-CM

## 2022-09-01 DIAGNOSIS — R4182 Altered mental status, unspecified: Secondary | ICD-10-CM | POA: Diagnosis not present

## 2022-09-01 DIAGNOSIS — G9341 Metabolic encephalopathy: Secondary | ICD-10-CM | POA: Diagnosis not present

## 2022-09-01 LAB — CULTURE, BLOOD (ROUTINE X 2)
Culture: NO GROWTH
Culture: NO GROWTH
Special Requests: ADEQUATE
Special Requests: ADEQUATE

## 2022-09-01 LAB — BASIC METABOLIC PANEL
Anion gap: 9 (ref 5–15)
BUN: 31 mg/dL — ABNORMAL HIGH (ref 8–23)
CO2: 25 mmol/L (ref 22–32)
Calcium: 8.7 mg/dL — ABNORMAL LOW (ref 8.9–10.3)
Chloride: 106 mmol/L (ref 98–111)
Creatinine, Ser: 1.72 mg/dL — ABNORMAL HIGH (ref 0.61–1.24)
GFR, Estimated: 40 mL/min — ABNORMAL LOW (ref >60)
Glucose, Bld: 91 mg/dL (ref 70–99)
Potassium: 3.7 mmol/L (ref 3.5–5.1)
Sodium: 140 mmol/L (ref 135–145)

## 2022-09-01 LAB — CBC
HCT: 37.7 % — ABNORMAL LOW (ref 39.0–52.0)
Hemoglobin: 12.4 g/dL — ABNORMAL LOW (ref 13.0–17.0)
MCH: 29.9 pg (ref 26.0–34.0)
MCHC: 32.9 g/dL (ref 30.0–36.0)
MCV: 90.8 fL (ref 80.0–100.0)
Platelets: 250 10*3/uL (ref 150–400)
RBC: 4.15 MIL/uL — ABNORMAL LOW (ref 4.22–5.81)
RDW: 13.6 % (ref 11.5–15.5)
WBC: 8.7 10*3/uL (ref 4.0–10.5)
nRBC: 0 % (ref 0.0–0.2)

## 2022-09-01 LAB — MAGNESIUM: Magnesium: 2 mg/dL (ref 1.7–2.4)

## 2022-09-01 MED ORDER — ASPIRIN 81 MG PO TBEC: 81.0000 mg | DELAYED_RELEASE_TABLET | Freq: Every day | ORAL | 12 refills | Status: DC

## 2022-09-01 NOTE — Progress Notes (Signed)
Inpatient Rehab Admissions Coordinator:   Note therapy recs updated to home health.  Will sign off for CIR.   Shann Medal, PT, DPT Admissions Coordinator [phone] 09/01/22  8:44 AM

## 2022-09-01 NOTE — Progress Notes (Signed)
Mobility Specialist: Progress Note   09/01/22 1110  Mobility  Activity Ambulated with assistance to bathroom  Level of Assistance Contact guard assist, steadying assist  Assistive Device None  Distance Ambulated (ft) 30 ft  Activity Response Tolerated well  Mobility Referral Yes  $Mobility charge 1 Mobility   Pt received up in the room attempting to go to the BR. Assisted pt to the BR, no c/o. Pt declining further ambulation stating he would like to eat first. Pt to bed per request with call bell at his side. Will f/u later for hallway ambulation.   [place] Kadin Canipe Mobility Specialist Please contact via SecureChat or Rehab office at [phone]

## 2022-09-01 NOTE — Care Management Important Message (Signed)
Important Message  Patient Details  Name: [name] MRN: [id number] Date of Birth: May 11, 1946   Medicare Important Message Given:  Yes     Orbie Pyo 09/01/2022, 2:43 PM

## 2022-09-01 NOTE — Progress Notes (Signed)
Occupational Therapy Re-evaluation/Treatment Patient Details Name: [name] MRN: [id number] DOB: June 15, 1946 Today's Date: 09/01/2022   History of present illness 76 y.o. male admitted 08/26/22 with aphasia and R side weakness. MRI (-). CT (-). 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   OT comments  Pt re-evaluated and treated this session due to NP reporting significant improvement in functional abilities, and pt's goals updated accordingly as well as discharge disposition. Pt performing LB ADL with min guard A, and UB ADL with set-up, following all one step commands. Pt continues to demonstrate cognitive difficulty, scoring a 24 on the Short Blessed Test indicating cognitive impairment consistent with dementia. Recommending home with HHOT to optimize safety and independence with ADL and IADL. Pt wife educated regarding provision of supervision for IADL including cooking, medication management, and finances.    Recommendations for follow up therapy are one component of a multi-disciplinary discharge planning process, led by the attending physician.  Recommendations may be updated based on patient status, additional functional criteria and insurance authorization.    Follow Up Recommendations  Home health OT     Assistance Recommended at Discharge Intermittent Supervision/Assistance  Patient can return home with the following  A little help with walking and/or transfers;A little help with bathing/dressing/bathroom;Assistance with cooking/housework;Direct supervision/assist for medications management;Direct supervision/assist for financial management;Assist for transportation;Help with stairs or ramp for entrance   Equipment Recommendations  Other (comment)    Recommendations for Other Services      Precautions / Restrictions Precautions Precautions: Fall Restrictions Weight Bearing Restrictions: No        Mobility Bed Mobility Overal bed mobility: Needs Assistance             General bed mobility comments: pt OOB in recliner pre and post session    Transfers Overall transfer level: Needs assistance Equipment used: Rolling walker (2 wheels), None Transfers: Sit to/from Stand Sit to Stand: Min guard           General transfer comment: close guard for safety, pt reaching for environment to self-steady.     Balance Overall balance assessment: Needs assistance Sitting-balance support: No upper extremity supported, Feet supported Sitting balance-Leahy Scale: Good     Standing balance support: During functional activity, Reliant on assistive device for balance, Single extremity supported Standing balance-Leahy Scale: Poor Standing balance comment: reliant on at least single UE support                           ADL either performed or assessed with clinical judgement   ADL Overall ADL's : Needs assistance/impaired Eating/Feeding: Set up;Sitting                               Tub/ Shower Transfer: Min guard;Rolling walker (2 wheels);Ambulation;Walk-in Lobbyist Details (indicate cue type and reason): cues for problem solving and safety Functional mobility during ADLs: Min guard;Cueing for safety;Rolling walker (2 wheels) General ADL Comments: cues for safe use of RW during functional mobility    Extremity/Trunk Assessment Upper Extremity Assessment Upper Extremity Assessment: Generalized weakness   Lower Extremity Assessment Lower Extremity Assessment: Defer to PT evaluation        Vision   Vision Assessment?: Vision impaired- to be further tested in functional context Additional Comments: Pt reports tumor that has to be removed and recently got glasses due to blurry or fuzzy  vision. Glasses not present   Perception     Praxis      Cognition Arousal/Alertness: Awake/alert Behavior During Therapy: WFL for tasks  assessed/performed Overall Cognitive Status: Within Functional Limits for tasks assessed                                 General Comments: Pt can be impulsive and witty. After cognitive testing, wife reporrts cognition is off as compared to baseline. Pt scoring a 23 on the short blessed test indicatng cognitive impairment consistent with dementia. Pt with max difficulty with dealyed memory recall, required indirect cues for year, and skipped one number when counting backward. Pt with max assist to state months of year in reverse order; did demonstrate some attempts to problem solve (stating forward to identify which one he should state next, but continued with error. Pt with attempts to mask cognitive difficulties by stating he was tired, had a headache, and with humor, so pt does have some awareness.        Exercises      Shoulder Instructions       General Comments VSS on RA, pt spouse present and supportive    Pertinent Vitals/ Pain       Pain Assessment Pain Assessment: Faces Faces Pain Scale: Hurts little more Pain Location: headache Pain Descriptors / Indicators: Headache Pain Intervention(s): Limited activity within patient's tolerance, Monitored during session  Home Living Family/patient expects to be discharged to:: Private residence Living Arrangements: Spouse/significant other                                      Prior Functioning/Environment              Frequency  Min 2X/week        Progress Toward Goals  OT Goals(current goals can now be found in the care plan section)  Progress towards OT goals: Progressing toward goals  Acute Rehab OT Goals Patient Stated Goal: go home OT Goal Formulation: With patient/family Time For Goal Achievement: 09/10/22 Potential to Achieve Goals: Good ADL Goals Pt Will Perform Grooming: with min assist;sitting Pt Will Perform Upper Body Bathing: with min assist;sitting Pt Will Transfer to  Toilet: with +2 assist;with min assist;bedside commode;stand pivot transfer Additional ADL Goal #1: pt will complete bed mobility mod (A) as precursor to adls.  Plan Discharge plan needs to be updated;Frequency remains appropriate    Co-evaluation                 AM-PAC OT "6 Clicks" Daily Activity     Outcome Measure   Help from another person eating meals?: None Help from another person taking care of personal grooming?: A Little Help from another person toileting, which includes using toliet, bedpan, or urinal?: A Little Help from another person bathing (including washing, rinsing, drying)?: A Little Help from another person to put on and taking off regular upper body clothing?: A Little Help from another person to put on and taking off regular lower body clothing?: A Little 6 Click Score: 19    End of Session Equipment Utilized During Treatment: Gait belt;Rolling walker (2 wheels)  OT Visit Diagnosis: Unsteadiness on feet (R26.81);Muscle weakness (generalized) (M62.81);Other symptoms and signs involving cognitive function;Pain Pain - part of body:  (headache)   Activity Tolerance Patient tolerated treatment well   Patient  Left in chair;with call bell/phone within reach;with chair alarm set;with family/visitor present   Nurse Communication Mobility status        Time: 4417-1278 OT Time Calculation (min): 30 min  Charges: OT General Charges $OT Visit: 1 Visit OT Evaluation $OT Re-eval: 1 Re-eval OT Treatments $Self Care/Home Management : 8-22 mins  [name], [name] [facility] Acute Rehabilitation Office: [phone]   Magnus Ivan 09/01/2022, 11:35 AM

## 2022-09-01 NOTE — Progress Notes (Signed)
Physical Therapy Treatment Patient Details Name: [name] MRN: [id number] DOB: 05-06-1946 Today's Date: 09/01/2022   History of Present Illness 76 y.o. male admitted 08/26/22 with aphasia and R side weakness. MRI (-). CT (-). 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    PT Comments    Pt greeted up in chair on arrival and agreeable to session with continued progress towards goals with session focused on gait, with and without AD, for improved activity tolerance and balance. Pt continues to demonstrate some impulsivity with movements however with cues pt able to slow and increase awareness of deficits. Pt initially ambulating with RW at supervision level, and demonstrating ambulation with no AD with min guard assist and cues for wider BOS as pt with tendency for scissoring steps intermittently. Pt spouse present and supportive throughout session. Anticipate safe discharge, with assist level below, once medically cleared, will follow acutely.    Recommendations for follow up therapy are one component of a multi-disciplinary discharge planning process, led by the attending physician.  Recommendations may be updated based on patient status, additional functional criteria and insurance authorization.  Follow Up Recommendations  Home health PT     Assistance Recommended at Discharge Frequent or constant Supervision/Assistance  Patient can return home with the following Direct supervision/assist for medications management;Direct supervision/assist for financial management;Assist for transportation;Help with stairs or ramp for entrance;A little help with walking and/or transfers;A little help with bathing/dressing/bathroom   Equipment Recommendations  Other (comment) (TBA)    Recommendations for Other Services       Precautions / Restrictions Precautions Precautions: Fall Restrictions Weight Bearing Restrictions: No      Mobility  Bed Mobility Overal bed mobility: Needs Assistance             General bed mobility comments: pt OOB in recliner pre and post session    Transfers Overall transfer level: Needs assistance Equipment used: Rolling walker (2 wheels), None Transfers: Sit to/from Stand Sit to Stand: Min guard           General transfer comment: close guard for safety, pt reaching for environment to self-steady.    Ambulation/Gait Ambulation/Gait assistance: Min guard, Supervision Gait Distance (Feet): 300 Feet Assistive device: Rolling walker (2 wheels), None Gait Pattern/deviations: Step-through pattern, Decreased stride length, Narrow base of support Gait velocity: decr     General Gait Details: initially pt using RW at supervisionlevel with cues for closer proximity, and wider BOS, last 150' with no AD with concetration on wider BOS as pt with scissoring steps   Stairs Stairs: Yes Stairs assistance: Min guard Stair Management: Two rails, Alternating pattern, Forwards Number of Stairs: 6 General stair comments: pt with some c/o dizziness when turning to descend, no LOB   Wheelchair Mobility    Modified Rankin (Stroke Patients Only)       Balance Overall balance assessment: Needs assistance Sitting-balance support: No upper extremity supported, Feet supported Sitting balance-Leahy Scale: Good     Standing balance support: During functional activity, Reliant on assistive device for balance, Single extremity supported Standing balance-Leahy Scale: Poor Standing balance comment: reliant on at least single UE support                            Cognition Arousal/Alertness: Awake/alert Behavior During Therapy: WFL for tasks assessed/performed Overall Cognitive Status: Within Functional Limits for tasks assessed  General Comments: Pt can be impulsive and witty, per pt's wife this is baseline         Exercises      General Comments General comments (skin integrity, edema, etc.): VSS on RA, pt spouse present and supportive with son arriving at end of session      Pertinent Vitals/Pain Pain Assessment Pain Assessment: Faces Faces Pain Scale: Hurts little more Pain Location: headache Pain Descriptors / Indicators: Headache Pain Intervention(s): Monitored during session, Patient requesting pain meds-RN notified, Limited activity within patient's tolerance    Home Living                          Prior Function            PT Goals (current goals can now be found in the care plan section) Acute Rehab PT Goals PT Goal Formulation: With patient/family Time For Goal Achievement: 09/10/22 Progress towards PT goals: Progressing toward goals    Frequency    Min 4X/week      PT Plan Current plan remains appropriate    Co-evaluation              AM-PAC PT "6 Clicks" Mobility   Outcome Measure  Help needed turning from your back to your side while in a flat bed without using bedrails?: A Little Help needed moving from lying on your back to sitting on the side of a flat bed without using bedrails?: A Little Help needed moving to and from a bed to a chair (including a wheelchair)?: A Little Help needed standing up from a chair using your arms (e.g., wheelchair or bedside chair)?: A Little Help needed to walk in hospital room?: A Little Help needed climbing 3-5 steps with a railing? : A Little 6 Click Score: 18    End of Session Equipment Utilized During Treatment: Gait belt Activity Tolerance: Patient tolerated treatment well Patient left: in chair;with call bell/phone within reach;with chair alarm set;with family/visitor present Nurse Communication: Mobility status;Patient requests pain meds PT Visit Diagnosis: Unsteadiness on feet (R26.81);Muscle weakness (generalized) (M62.81);Difficulty in walking, not elsewhere classified (R26.2)     Time:  4462-8638 PT Time Calculation (min) (ACUTE ONLY): 17 min  Charges:  $Gait Training: 8-22 mins                     [name] R. PTA Acute Rehabilitation Services Office: [place] 09/01/2022, 10:02 AM

## 2022-09-01 NOTE — Plan of Care (Signed)
  Problem: Education: Goal: Knowledge of disease or condition will improve Outcome: Adequate for Discharge Goal: Knowledge of secondary prevention will improve (MUST DOCUMENT ALL) Outcome: Adequate for Discharge Goal: Knowledge of patient specific risk factors will improve [place] N/A or DELETE if not current risk factor) Outcome: Adequate for Discharge   Problem: Ischemic Stroke/TIA Tissue Perfusion: Goal: Complications of ischemic stroke/TIA will be minimized Outcome: Adequate for Discharge   Problem: Coping: Goal: Will verbalize positive feelings about self Outcome: Adequate for Discharge Goal: Will identify appropriate support needs Outcome: Adequate for Discharge   Problem: Health Behavior/Discharge Planning: Goal: Ability to manage health-related needs will improve Outcome: Adequate for Discharge Goal: Goals will be collaboratively established with patient/family Outcome: Adequate for Discharge   Problem: Self-Care: Goal: Ability to participate in self-care as condition permits will improve Outcome: Adequate for Discharge Goal: Verbalization of feelings and concerns over difficulty with self-care will improve Outcome: Adequate for Discharge Goal: Ability to communicate needs accurately will improve Outcome: Adequate for Discharge   Problem: Nutrition: Goal: Risk of aspiration will decrease Outcome: Adequate for Discharge Goal: Dietary intake will improve Outcome: Adequate for Discharge   Problem: Safety: Goal: Non-violent Restraint(s) Outcome: Adequate for Discharge   Problem: Education: Goal: Knowledge of General Education information will improve Description: Including pain rating scale, medication(s)/side effects and non-pharmacologic comfort measures Outcome: Adequate for Discharge   Problem: Health Behavior/Discharge Planning: Goal: Ability to manage health-related needs will improve Outcome: Adequate for Discharge   Problem: Clinical Measurements: Goal:  Ability to maintain clinical measurements within normal limits will improve Outcome: Adequate for Discharge Goal: Will remain free from infection Outcome: Adequate for Discharge Goal: Diagnostic test results will improve Outcome: Adequate for Discharge Goal: Respiratory complications will improve Outcome: Adequate for Discharge Goal: Cardiovascular complication will be avoided Outcome: Adequate for Discharge   Problem: Activity: Goal: Risk for activity intolerance will decrease Outcome: Adequate for Discharge   Problem: Nutrition: Goal: Adequate nutrition will be maintained Outcome: Adequate for Discharge   Problem: Coping: Goal: Level of anxiety will decrease Outcome: Adequate for Discharge   Problem: Elimination: Goal: Will not experience complications related to bowel motility Outcome: Adequate for Discharge Goal: Will not experience complications related to urinary retention Outcome: Adequate for Discharge   Problem: Pain Managment: Goal: General experience of comfort will improve Outcome: Adequate for Discharge   Problem: Safety: Goal: Ability to remain free from injury will improve Outcome: Adequate for Discharge   Problem: Skin Integrity: Goal: Risk for impaired skin integrity will decrease Outcome: Adequate for Discharge

## 2022-09-01 NOTE — Discharge Summary (Addendum)
Stroke Discharge Summary  Patient ID: jasiah elsen   MRN: [id number]      DOB: [birth year]  Date of Admission: 08/26/2022 Date of Discharge: 09/01/2022  Attending Physician:  Stroke, Md, MD, Stroke MD  Patient's PCP:  Deon Pilling, NP  DISCHARGE DIAGNOSIS:  Acute Encephalopathy due to unspecified infection  Active Problems:   HTN   HLD   Fever with leukocytosis   Isolated afib with RVR   AKI   Sundowning    Allergies as of 09/01/2022   No Known Allergies      Medication List     STOP taking these medications    Fish Oil 1200 MG Caps   ibuprofen 600 MG tablet Commonly known as: ADVIL   oxyCODONE-acetaminophen 10-325 MG tablet Commonly known as: PERCOCET       TAKE these medications    acetaminophen 500 MG tablet Commonly known as: TYLENOL Take 1 tablet (500 mg total) by mouth every 6 (six) hours as needed for mild pain or headache.   allopurinol 300 MG tablet Commonly known as: ZYLOPRIM Take 300 mg by mouth daily.   amLODipine 5 MG tablet Commonly known as: NORVASC Take 5 mg by mouth daily.   aspirin EC 81 MG tablet Take 1 tablet (81 mg total) by mouth daily. Swallow whole. Start taking on: September 02, 2022   ENBREL [place] Inject 1 Dose into the skin every Monday.   escitalopram 20 MG tablet Commonly known as: LEXAPRO Take 20 mg by mouth at bedtime.   folic acid 1 MG tablet Commonly known as: FOLVITE Take 1 mg by mouth daily.   loperamide 2 MG capsule Commonly known as: IMODIUM Take 1 capsule (2 mg total) by mouth every 6 (six) hours as needed for diarrhea or loose stools.   losartan-hydrochlorothiazide 100-25 MG tablet Commonly known as: HYZAAR Take 1 tablet by mouth daily.   pantoprazole 40 MG tablet Commonly known as: PROTONIX Take 40 mg by mouth daily as needed (pain).   pravastatin 40 MG tablet Commonly known as: PRAVACHOL Take 40 mg by mouth daily.   predniSONE 5 MG tablet Commonly known as: DELTASONE Take 5 mg by mouth  daily with breakfast.   propranolol ER 60 MG 24 hr capsule Commonly known as: INDERAL LA Take 1 capsule (60 mg total) by mouth daily. Please call and schedule appointment for March 2024 or sooner if symptoms change.   testosterone cypionate 200 MG/ML injection Commonly known as: DEPOTESTOSTERONE CYPIONATE Inject 200 mg into the muscle every 14 (fourteen) days.   traMADol 50 MG tablet Commonly known as: ULTRAM Take 50 mg by mouth 2 (two) times daily as needed for moderate pain.   traZODone 50 MG tablet Commonly known as: DESYREL Take 50 mg by mouth at bedtime as needed for sleep.               Durable Medical Equipment  (From admission, onward)           Start     Ordered   09/01/22 1237  DME Walker  Once       Question Answer Comment  Walker: With 5 Inch Wheels   Patient needs a walker to treat with the following condition Weakness generalized   Patient needs a walker to treat with the following condition Balance problem      09/01/22 1246            LABORATORY STUDIES CBC    Component Value Date/Time  WBC 8.7 09/01/2022 0231   RBC 4.15 (L) 09/01/2022 0231   HGB 12.4 (L) 09/01/2022 0231   HCT 37.7 (L) 09/01/2022 0231   PLT 250 09/01/2022 0231   MCV 90.8 09/01/2022 0231   MCH 29.9 09/01/2022 0231   MCHC 32.9 09/01/2022 0231   RDW 13.6 09/01/2022 0231   LYMPHSABS 3.3 08/26/2022 1004   MONOABS 0.8 08/26/2022 1004   EOSABS 0.2 08/26/2022 1004   BASOSABS 0.1 08/26/2022 1004   CMP    Component Value Date/Time   NA 140 09/01/2022 0231   K 3.7 09/01/2022 0231   CL 106 09/01/2022 0231   CO2 25 09/01/2022 0231   GLUCOSE 91 09/01/2022 0231   BUN 31 (H) 09/01/2022 0231   CREATININE 1.72 (H) 09/01/2022 0231   CALCIUM 8.7 (L) 09/01/2022 0231   PROT 6.1 (L) 08/29/2022 0344   ALBUMIN 2.8 (L) 08/29/2022 0344   AST 35 08/29/2022 0344   ALT 20 08/29/2022 0344   ALKPHOS 54 08/29/2022 0344   BILITOT 1.0 08/29/2022 0344   GFRNONAA 40 (L) 09/01/2022 0231    GFRAA 42 (L) 10/31/2017 1147   COAGS Lab Results  Component Value Date   INR 1.2 08/26/2022   INR 1.2 01/09/2022   INR 1.0 12/27/2021   Lipid Panel    Component Value Date/Time   CHOL 138 08/27/2022 0311   TRIG 188 (H) 08/27/2022 0311   HDL 27 (L) 08/27/2022 0311   CHOLHDL 5.1 08/27/2022 0311   VLDL 38 08/27/2022 0311   LDLCALC 73 08/27/2022 0311   HgbA1C  Lab Results  Component Value Date   HGBA1C 5.8 (H) 08/26/2022   Urinalysis    Component Value Date/Time   COLORURINE YELLOW 08/27/2022 1449   APPEARANCEUR CLEAR 08/27/2022 1449   LABSPEC 1.020 08/27/2022 1449   PHURINE 5.0 08/27/2022 1449   GLUCOSEU NEGATIVE 08/27/2022 1449   HGBUR MODERATE (A) 08/27/2022 1449   BILIRUBINUR NEGATIVE 08/27/2022 1449   KETONESUR NEGATIVE 08/27/2022 1449   PROTEINUR 30 (A) 08/27/2022 1449   NITRITE NEGATIVE 08/27/2022 1449   LEUKOCYTESUR NEGATIVE 08/27/2022 1449   Urine Drug Screen     Component Value Date/Time   LABOPIA NONE DETECTED 08/26/2022 1002   COCAINSCRNUR NONE DETECTED 08/26/2022 1002   LABBENZ NONE DETECTED 08/26/2022 1002   AMPHETMU NONE DETECTED 08/26/2022 1002   THCU NONE DETECTED 08/26/2022 1002   LABBARB NONE DETECTED 08/26/2022 1002    Alcohol Level    Component Value Date/Time   ETH <10 08/26/2022 1004     SIGNIFICANT DIAGNOSTIC STUDIES VAS [place] LOWER EXTREMITY VENOUS (DVT)  Result Date: 08/30/2022  Lower Venous DVT Study Patient Name:  MAUI BRITTEN  Date of Exam:   08/29/2022 Medical Rec #: [id number]        Accession #:    [id number] Date of Birth: 12/31/1945         Patient Gender: M Patient Age:   76 years Exam Location:  [facility] Procedure:      VAS [place] LOWER EXTREMITY VENOUS (DVT) Referring Phys: Hillery Aldo --------------------------------------------------------------------------------  Indications: Leukocytosis.  Limitations: Continuous patient movement - Altered mental status. Comparison Study: No previous exams Performing  Technologist: Jody Hill RVT, RDMS  Examination Guidelines: A complete evaluation includes B-mode imaging, spectral Doppler, color Doppler, and power Doppler as needed of all accessible portions of each vessel. Bilateral testing is considered an integral part of a complete examination. Limited examinations for reoccurring indications may be performed as noted. The reflux portion of the exam  is performed with the patient in reverse Trendelenburg.  +---------+---------------+---------+-----------+----------+--------------+ RIGHT    CompressibilityPhasicitySpontaneityPropertiesThrombus Aging +---------+---------------+---------+-----------+----------+--------------+ CFV      Full           Yes      Yes                                 +---------+---------------+---------+-----------+----------+--------------+ SFJ      Full                                                        +---------+---------------+---------+-----------+----------+--------------+ FV Prox  Full           Yes      Yes                                 +---------+---------------+---------+-----------+----------+--------------+ FV Mid   Full           Yes      Yes                                 +---------+---------------+---------+-----------+----------+--------------+ FV DistalFull           Yes      Yes                                 +---------+---------------+---------+-----------+----------+--------------+ PFV      Full                                                        +---------+---------------+---------+-----------+----------+--------------+ POP      Full           Yes      Yes                                 +---------+---------------+---------+-----------+----------+--------------+ PTV      Full                                                        +---------+---------------+---------+-----------+----------+--------------+ PERO     Full                                                         +---------+---------------+---------+-----------+----------+--------------+   +---------+---------------+---------+-----------+----------+--------------+ LEFT     CompressibilityPhasicitySpontaneityPropertiesThrombus Aging +---------+---------------+---------+-----------+----------+--------------+ CFV      Full           Yes      Yes                                 +---------+---------------+---------+-----------+----------+--------------+  SFJ      Full                                                        +---------+---------------+---------+-----------+----------+--------------+ FV Prox  Full           Yes      Yes                                 +---------+---------------+---------+-----------+----------+--------------+ FV Mid   Full           Yes      Yes                                 +---------+---------------+---------+-----------+----------+--------------+ FV DistalFull           Yes      Yes                                 +---------+---------------+---------+-----------+----------+--------------+ PFV      Full                                                        +---------+---------------+---------+-----------+----------+--------------+ POP      Full           Yes      Yes                                 +---------+---------------+---------+-----------+----------+--------------+ PTV      Full                                                        +---------+---------------+---------+-----------+----------+--------------+ PERO     Full                                                        +---------+---------------+---------+-----------+----------+--------------+     Summary: BILATERAL: - No evidence of deep vein thrombosis seen in the lower extremities, bilaterally. -No evidence of popliteal cyst, bilaterally.   *See table(s) above for measurements and observations. Electronically signed by Deitra Mayo MD  on 08/30/2022 at 4:31:11 AM.    Final    CT Angio Chest Pulmonary Embolism (PE) W or WO Contrast  Result Date: 08/29/2022 CLINICAL DATA:  Clinical suspicion for pulmonary embolism. History from the previous day's chest radiograph describes aspiration. EXAM: CT ANGIOGRAPHY CHEST WITH CONTRAST TECHNIQUE: Multidetector CT imaging of the chest was performed using the standard protocol during bolus administration of intravenous contrast. Multiplanar CT image reconstructions and MIPs were obtained to evaluate the vascular anatomy. RADIATION DOSE REDUCTION: This exam was performed according to  the departmental dose-optimization program which includes automated exposure control, adjustment of the mA and/or kV according to patient size and/or use of iterative reconstruction technique. CONTRAST:  84m OMNIPAQUE IOHEXOL 350 MG/ML SOLN COMPARISON:  Chest radiograph, 08/28/2022 and older exams. FINDINGS: Cardiovascular: Pulmonary arteries are well opacified. There is no evidence of a pulmonary embolism. Heart normal in size and configuration. Three-vessel coronary artery calcifications. No pericardial effusion. Great vessels are normal in caliber. Aorta not opacified. Mild aortic atherosclerotic calcifications. Mediastinum/Nodes: No neck base, mediastinal or hilar masses. No enlarged lymph nodes. Trachea somewhat flattened consistent with an extra tori exam. Otherwise unremarkable. Normal esophagus. Lungs/Pleura: Mild linear and dependent opacities in the lower lobes consistent with atelectasis. No evidence of pneumonia or aspiration pneumonitis. No pulmonary edema. No lung mass or nodule. No pleural effusion or pneumothorax. Upper Abdomen: No acute abnormality. Musculoskeletal: No fracture or acute finding. No bone lesion. No chest wall mass. Review of the MIP images confirms the above findings. IMPRESSION: 1. No acute findings. No evidence of pneumonia or aspiration pneumonitis. 2. Coronary artery calcifications and  aortic atherosclerosis. Aortic Atherosclerosis (ICD10-I70.0). Electronically Signed   By: DLajean ManesM.D.   On: 08/29/2022 18:04   DG CHEST PORT 1 VIEW  Result Date: 08/28/2022 CLINICAL DATA:  Aspiration into airway EXAM: PORTABLE CHEST 1 VIEW COMPARISON:  08/26/2022 FINDINGS: No change from 08/24/2022. Stable cardiomediastinal silhouette. Aortic atherosclerotic calcification. No focal consolidation, pleural effusion, or pneumothorax. No acute osseous abnormality. IMPRESSION: No change from 08/26/2022.  No active disease. Electronically Signed   By: TPlacido SouM.D.   On: 08/28/2022 21:25   MR BRAIN WO CONTRAST  Result Date: 08/28/2022 CLINICAL DATA:  Aphasia and right-sided weakness. EXAM: MRI HEAD WITHOUT CONTRAST TECHNIQUE: Multiplanar, multiecho pulse sequences of the brain and surrounding structures were obtained without intravenous contrast. COMPARISON:  CT head 1 day prior, MR head 04/06/2022 FINDINGS: Brain: There is no acute intracranial hemorrhage, extra-axial fluid collection, or acute infarct. There is background parenchymal volume loss with prominence of the ventricular system and extra-axial CSF spaces. Patchy and confluent FLAIR signal abnormality throughout the supratentorial white matter likely reflects sequela of chronic small-vessel ischemic change, stable. 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 There is no other solid mass lesion there is no mass effect or midline shift. Vascular: Normal flow voids. Skull and upper cervical spine: Normal marrow signal. Sinuses/Orbits: There is mild mucosal thickening in the paranasal sinuses. Bilateral lens implants are in place. The globes and orbits are otherwise unremarkable. Other: None. IMPRESSION: 1. No acute intracranial pathology. Stable background chronic small-vessel ischemic change. 2. Stable known pituitary macro adenoma. Electronically Signed   By:  PValetta MoleM.D.   On: 08/28/2022 15:24   EEG adult  Result Date: 08/27/2022 SDerek Jack MD     08/27/2022  3:57 PM Routine EEG Report DREXFORD PREVOis a 76y.o. male with a history of altered mental status who is undergoing an EEG to evaluate for seizures. Report: This EEG was acquired with electrodes placed according to the International 10-20 electrode system (including Fp1, Fp2, F3, F4, C3, C4, P3, P4, O1, O2, T3, T4, T5, T6, A1, A2, Fz, Cz, Pz). The following electrodes were missing or displaced: none. The occipital dominant rhythm was 7-8 Hz. This activity is reactive to stimulation. Drowsiness was manifested by background fragmentation; deeper stages of sleep were not identified. There was no focal slowing.  There were no interictal epileptiform discharges. There were no electrographic seizures identified. Photic stimulation and hyperventilation were not performed. Impression and clinical correlation: This EEG was obtained while awake and drowsy and is abnormal due to mild diffuse slowing indicative of global cerebral dysfunction. Epileptiform abnormalities were not seen during this recording. Su Monks, MD [facility] [phone] If 7pm- 7am, please page neurology on call as listed in [place].   CT HEAD WO CONTRAST (5MM)  Result Date: 08/27/2022 CLINICAL DATA:  Stroke follow-up EXAM: CT HEAD WITHOUT CONTRAST TECHNIQUE: Contiguous axial images were obtained from the base of the skull through the vertex without intravenous contrast. RADIATION DOSE REDUCTION: This exam was performed according to the departmental dose-optimization program which includes automated exposure control, adjustment of the mA and/or kV according to patient size and/or use of iterative reconstruction technique. COMPARISON:  Head CT from yesterday FINDINGS: Brain: No evidence of acute infarction, hemorrhage, hydrocephalus, extra-axial collection or mass lesion/mass effect. Chronic small vessel ischemia in  the cerebral white matter to a moderate degree. Vascular: No hyperdense vessel or unexpected calcification. Skull: Normal. Negative for fracture or focal lesion. Sinuses/Orbits: Bilateral cataract resection. IMPRESSION: 1. No acute finding. 2. Moderate chronic small vessel ischemia. Electronically Signed   By: Jorje Guild M.D.   On: 08/27/2022 11:39   ECHOCARDIOGRAM COMPLETE  Result Date: 08/26/2022    ECHOCARDIOGRAM REPORT   Patient Name:   SCOTT VANDERVEER Date of Exam: 08/26/2022 Medical Rec #:  [id number]       Height:       68.0 in Accession #:    [id number]      Weight:       158.3 lb Date of Birth:  [birth year]        BSA:          1.850 m Patient Age:    73 years        BP:           152/79 mmHg Patient Gender: M               HR:           108 bpm. Exam Location:  Inpatient Procedure: 2D Echo, Cardiac Doppler and Color Doppler Indications:    Stroke I63.9  History:        Patient has no prior history of Echocardiogram examinations.                 Stroke; Risk Factors:Hypertension and Dyslipidemia. CKD, stage                 III.  Sonographer:    Ronny Flurry Referring Phys: Amie Portland  Sonographer Comments: Image acquisition challenging due to uncooperative patient. IMPRESSIONS  1. Left ventricular ejection fraction, by estimation, is 60 to 65%. The left ventricle has normal function. The left ventricle has no regional wall motion abnormalities. Indeterminate diastolic filling due to E-A fusion.  2. Right ventricular systolic function is normal. The right ventricular size is normal.  3. No evidence of mitral valve regurgitation.  4. The aortic valve was not well visualized. Aortic valve regurgitation is not visualized.  5. The inferior vena cava is normal in size with greater than 50% respiratory variability, suggesting right atrial pressure of 3 mmHg. Comparison(s): No prior Echocardiogram. FINDINGS  Left Ventricle: Left ventricular ejection fraction, by estimation, is 60 to 65%. The left  ventricle has normal function. The left ventricle has no regional wall motion abnormalities. The left ventricular internal cavity size  was normal in size. There is  no left ventricular hypertrophy. Indeterminate diastolic filling due to E-A fusion. Right Ventricle: The right ventricular size is normal. Right ventricular systolic function is normal. Left Atrium: Left atrial size was normal in size. Right Atrium: Right atrial size was normal in size. Pericardium: There is no evidence of pericardial effusion. Mitral Valve: No evidence of mitral valve regurgitation. Tricuspid Valve: Tricuspid valve regurgitation is not demonstrated. Aortic Valve: The aortic valve was not well visualized. Aortic valve regurgitation is not visualized. Aortic valve mean gradient measures 14.0 mmHg. Aortic valve peak gradient measures 26.6 mmHg. Aortic valve area, by VTI measures 1.57 cm. Pulmonic Valve: Pulmonic valve regurgitation is not visualized. Aorta: The aortic root and ascending aorta are structurally normal, with no evidence of dilitation. Venous: The inferior vena cava is normal in size with greater than 50% respiratory variability, suggesting right atrial pressure of 3 mmHg. IAS/Shunts: The interatrial septum was not well visualized.  LEFT VENTRICLE PLAX 2D LVOT diam:     2.10 cm   Diastology LV SV:         48        LV e' lateral:   11.00 cm/s LV SV Index:   26        LV E/e' lateral: 5.6 LVOT Area:     3.46 cm  RIGHT VENTRICLE RV S prime:     14.70 cm/s TAPSE (M-mode): 1.7 cm AORTIC VALVE AV Area (Vmax):    1.31 cm AV Area (Vmean):   1.29 cm AV Area (VTI):     1.57 cm AV Vmax:           258.00 cm/s AV Vmean:          169.000 cm/s AV VTI:            0.308 m AV Peak Grad:      26.6 mmHg AV Mean Grad:      14.0 mmHg LVOT Vmax:         97.65 cm/s LVOT Vmean:        63.000 cm/s LVOT VTI:          0.140 m LVOT/AV VTI ratio: 0.45  AORTA Ao Root diam: 3.70 cm Ao Asc diam:  3.50 cm MITRAL VALVE MV Area (PHT): 4.80 cm     SHUNTS  MV Decel Time: 158 msec     Systemic VTI:  0.14 m MV E velocity: 61.70 cm/s   Systemic Diam: 2.10 cm MV A velocity: 110.00 cm/s MV E/A ratio:  0.56 Mary Scientist, physiological signed by Phineas Inches Signature Date/Time: 08/26/2022/4:09:27 PM    Final    CT ABDOMEN PELVIS WO CONTRAST  Result Date: 08/26/2022 CLINICAL DATA:  Acute generalized abdominal pain. EXAM: CT ABDOMEN AND PELVIS WITHOUT CONTRAST TECHNIQUE: Multidetector CT imaging of the abdomen and pelvis was performed following the standard protocol without IV contrast. RADIATION DOSE REDUCTION: This exam was performed according to the departmental dose-optimization program which includes automated exposure control, adjustment of the mA and/or kV according to patient size and/or use of iterative reconstruction technique. COMPARISON:  Jan 09, 2022. FINDINGS: Lower chest: No acute abnormality. Hepatobiliary: No focal liver abnormality is seen. Status post cholecystectomy. No biliary dilatation. Pancreas: Unremarkable. No pancreatic ductal dilatation or surrounding inflammatory changes. Spleen: Normal in size without focal abnormality. Adrenals/Urinary Tract: Adrenal glands appear normal. Bilateral renal cysts are noted for which no further follow-up is required. No hydronephrosis or renal obstruction is noted. Urinary bladder is unremarkable. Stomach/Bowel: The  stomach appears normal. Status post appendectomy. There is no evidence of bowel obstruction or inflammation. Sigmoid diverticulosis is noted without inflammation. Vascular/Lymphatic: Aortic atherosclerosis. No enlarged abdominal or pelvic lymph nodes. Reproductive: Prostate is unremarkable. Other: No abdominal wall hernia or abnormality. No abdominopelvic ascites. Musculoskeletal: No acute or significant osseous findings. IMPRESSION: Sigmoid diverticulosis without inflammation. No acute abnormality seen in the abdomen or pelvis. Aortic Atherosclerosis (ICD10-I70.0). Electronically Signed   By: Marijo Conception M.D.   On: 08/26/2022 12:58   DG Chest Portable 1 View  Result Date: 08/26/2022 CLINICAL DATA:  Tachypnea EXAM: PORTABLE CHEST 1 VIEW COMPARISON:  Chest x-ray Jan 09, 2022. FINDINGS: The heart size and mediastinal contours are within normal limits. Both lungs are clear. No visible pleural effusions or pneumothorax. No acute osseous abnormality. IMPRESSION: No active disease. Electronically Signed   By: Margaretha Sheffield M.D.   On: 08/26/2022 11:58   CT ANGIO HEAD NECK W WO CM  Result Date: 08/26/2022 CLINICAL DATA:  Stroke suspected. EXAM: CT ANGIOGRAPHY HEAD AND NECK TECHNIQUE: Multidetector CT imaging of the head and neck was performed using the standard protocol during bolus administration of intravenous contrast. Multiplanar CT image reconstructions and MIPs were obtained to evaluate the vascular anatomy. Carotid stenosis measurements (when applicable) are obtained utilizing NASCET criteria, using the distal internal carotid diameter as the denominator. RADIATION DOSE REDUCTION: This exam was performed according to the departmental dose-optimization program which includes automated exposure control, adjustment of the mA and/or kV according to patient size and/or use of iterative reconstruction technique. CONTRAST:  4m OMNIPAQUE IOHEXOL 350 MG/ML SOLN COMPARISON:  None Available. FINDINGS AND IMPRESSION: Please see same day prior CTA head/neck angiogram dictation. Prior dictation, which was time stamped at 10:11:35 AM was based on interpretation for the images available on this accession ((#[id number]). Electronically Signed   By: HMarin RobertsM.D.   On: 08/26/2022 11:04   CT ANGIO HEAD NECK W WO CM (CODE STROKE)  Result Date: 08/26/2022 CLINICAL DATA:  Stroke suspected. Aphasia EXAM: CT HEAD WITHOUT CONTRAST CT ANGIOGRAPHY OF THE HEAD AND NECK TECHNIQUE: Contiguous axial images were obtained from the base of the skull through the vertex without intravenous contrast. Multidetector CT  imaging of the head and neck was performed using the standard protocol during bolus administration of intravenous contrast. Multiplanar CT image reconstructions and MIPs were obtained to evaluate the vascular anatomy. Carotid stenosis measurements (when applicable) are obtained utilizing NASCET criteria, using the distal internal carotid diameter as the denominator. RADIATION DOSE REDUCTION: This exam was performed according to the departmental dose-optimization program which includes automated exposure control, adjustment of the mA and/or kV according to patient size and/or use of iterative reconstruction technique. CONTRAST:  1564mOMNIPAQUE IOHEXOL 350 MG/ML SOLN COMPARISON:  MRI Brain 04/06/22 FINDINGS: CT HEAD Brain: Sequela of severe chronic microvascular ischemic change. Chronic mineralization of the right basal ganglia. There is no CT evidence of an acute infarct. No hemorrhage. No hydrocephalus. Size and shape of the ventricular system is unchanged compared to prior exam. No extra-axial fluid collection. 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 Vascular: No hyperdense vessel.  See below for additional findings. Skull: Normal. Negative for fracture or focal lesion. Sinuses/Orbits: No acute finding.  Bilateral lens replacement. CTA NECK Aortic arch: There is soft atherosclerotic plaque, most notably after the takeoff of the left subclavian artery. No dissection.  There is soft atherosclerotic plaque in the proximal left subclavian artery resulting in mild stenosis. Right carotid system: No hemodynamically significant stenosis, dissection, or aneurysm. Left carotid system: No hemodynamically significant stenosis, dissection, or aneurysm. Vertebral arteries:The origin of the left vertebral artery is poorly assessed due to the presence of dense calcified atherosclerotic plaque in  this region. No hemodynamically significant stenosis, dissection, or aneurysm. Skeleton: Negative Other neck: Negative CTA HEAD Anterior circulation: No aneurysm, stenosis, or occlusion. Posterior circulation:  No aneurysm, stenosis, or occlusion. Venous sinuses: Patent when accounting for timing of contrast bolus. Anatomic variants: None IMPRESSION: 1. No acute intracranial abnormality. 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 2. No emergent large vessel occlusion. 3. No hemodynamically significant stenosis in the neck. 4. Redemonstrated pituitary macroadenoma, which invades the right cavernous sinus. Findings were discussed with Dr. Lorrin Goodell on 08/26/22 at 10:41 AM. Aortic Atherosclerosis (ICD10-I70.0). Electronically Signed   By: Marin Roberts M.D.   On: 08/26/2022 10:57   CT HEAD WO CONTRAST (5MM)  Result Date: 08/26/2022 CLINICAL DATA:  Stroke suspected. Aphasia EXAM: CT HEAD WITHOUT CONTRAST CT ANGIOGRAPHY OF THE HEAD AND NECK TECHNIQUE: Contiguous axial images were obtained from the base of the skull through the vertex without intravenous contrast. Multidetector CT imaging of the head and neck was performed using the standard protocol during bolus administration of intravenous contrast. Multiplanar CT image reconstructions and MIPs were obtained to evaluate the vascular anatomy. Carotid stenosis measurements (when applicable) are obtained utilizing NASCET criteria, using the distal internal carotid diameter as the denominator. RADIATION DOSE REDUCTION: This exam was performed according to the departmental dose-optimization program which includes automated exposure control, adjustment of the mA and/or kV according to patient size and/or use of iterative reconstruction technique. CONTRAST:  1107m OMNIPAQUE IOHEXOL 350 MG/ML SOLN COMPARISON:  MRI Brain 04/06/22 FINDINGS: CT HEAD Brain: Sequela of severe chronic microvascular ischemic change. Chronic mineralization of the right basal ganglia.  There is no CT evidence of an acute infarct. No hemorrhage. No hydrocephalus. Size and shape of the ventricular system is unchanged compared to prior exam. No extra-axial fluid collection. 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 Vascular: No hyperdense vessel.  See below for additional findings. Skull: Normal. Negative for fracture or focal lesion. Sinuses/Orbits: No acute finding.  Bilateral lens replacement. CTA NECK Aortic arch: There is soft atherosclerotic plaque, most notably after the takeoff of the left subclavian artery. No dissection. There is soft atherosclerotic plaque in the proximal left subclavian artery resulting in mild stenosis. Right carotid system: No hemodynamically significant stenosis, dissection, or aneurysm. Left carotid system: No hemodynamically significant stenosis, dissection, or aneurysm. Vertebral arteries:The origin of the left vertebral artery is poorly assessed due to the presence of dense calcified atherosclerotic plaque in this region. No hemodynamically significant stenosis, dissection, or aneurysm. Skeleton: Negative Other neck: Negative CTA HEAD Anterior circulation: No aneurysm, stenosis, or occlusion. Posterior circulation:  No aneurysm, stenosis, or occlusion. Venous sinuses: Patent when accounting for timing of contrast bolus. Anatomic variants: None IMPRESSION: 1. No acute intracranial abnormality. 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 2. No emergent large vessel occlusion. 3. No hemodynamically significant stenosis in the neck. 4. Redemonstrated pituitary macroadenoma, which invades the right cavernous sinus. Findings were discussed with Dr. KLorrin Goodellon 08/26/22 at 10:41 AM. Aortic Atherosclerosis (ICD10-I70.0). Electronically Signed   By: HMarin RobertsM.D.   On:  08/26/2022 10:57   CT HEAD CODE STROKE WO  CONTRAST  Result Date: 08/26/2022 CLINICAL DATA:  Code stroke. Neuro deficit, acute, stroke suspected. EXAM: CT HEAD WITHOUT CONTRAST TECHNIQUE: Contiguous axial images were obtained from the base of the skull through the vertex without intravenous contrast. RADIATION DOSE REDUCTION: This exam was performed according to the departmental dose-optimization program which includes automated exposure control, adjustment of the mA and/or kV according to patient size and/or use of iterative reconstruction technique. COMPARISON:  Brain MRI 04/06/2022.  Head CT 01/09/2022. FINDINGS: Brain: Mild generalized cerebral atrophy. Advanced patchy and ill-defined hypoattenuation within the cerebral white matter, nonspecific but compatible with chronic small vessel disease. Known pituitary macroadenoma, incompletely assessed on this non-contrast head CT, but grossly unchanged in size from the prior brain MRI of 04/06/2022. There is no acute intracranial hemorrhage. No acute demarcated cortical infarct. No extra-axial fluid collection. No midline shift. Vascular: No hyperdense vessel. Atherosclerotic calcifications. Skull: No fracture or aggressive osseous lesion. Sinuses/Orbits: No mass or acute finding within the imaged orbits. Mild mucosal thickening within the bilateral ethmoid and sphenoid sinuses. ASPECTS [facility] Stroke Program Early CT Score) - Ganglionic level infarction (caudate, lentiform nuclei, internal capsule, insula, M1-M3 cortex): 7 - Supraganglionic infarction (M4-M6 cortex): 3 Total score (0-10 with 10 being normal): 10 No evidence of acute intracranial hemorrhage or acute infarct. These results were called by telephone at the time of interpretation on 08/26/2022 at 10:15 am to provider Dr. Lorrin Goodell, who verbally acknowledged these results. IMPRESSION: 1. No evidence of acute intracranial hemorrhage or acute infarct. 2. Known pituitary macroadenoma, incompletely assessed on this non-contrast head CT, but  grossly unchanged from the prior brain MRI of 04/06/2022. 3. Advanced chronic small vessel image changes within the cerebral white matter. 4. Mild generalized cerebral atrophy. Electronically Signed   By: Kellie Simmering D.O.   On: 08/26/2022 10:36      HISTORY OF PRESENT ILLNESS JAYDYN BOZZO is a 76 y.o. male with PMH significant for CKD3, HTN, RA, HLD, GERD, pituitary macroadenoma and headaches who presents as a code stroke for aphasia and mild R sided weakness.   Him and wife were at [place]. He went to the bathroom at [employer] and did not come out. Wife checked on him and found him having trouble moving. EMS called and he was brought in as a code stroke.   On arrival, Sherlock seems shivering. Vitals are normal. He has left gaze preference and is confused with some ?aphasia out of proportion to his confusion. With encouragement, he will look to his right but then go back to looking to his left.   Wife reports he acted this way when he was diagnosed with diverticulitis back in may but at that time, occurred over the course of a couple days and not this rapid.   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   LKW: 0825 on 08/26/22 mRS: 0 tNKASE: Spoke to wife and expressed concerns regarding potential stroke with the gaze preference noted along with concern for aphasia. Thrombectomy: Not offered, no LVO.   HOSPITAL COURSE QUINLAN VOLLMER is a 76 y.o. male with PMH significant for CKD3, HTN, RA, HLD, GERD, pituitary macroadenoma and headaches who presents as a code stroke for aphasia and mild R sided weakness. He complained of abdominal pain and went to bathroom at [employer] and found leaning against the wall confused. Given  TNK on admission. Presentation c/f stroke vs post-ictal state.   Encephalopathy, resolved Confusion and delirium and drowsiness Etiology not clear, could be due to fever  vs. ? Post-ictal state EEG no seizure Mental status improved and then resolved, continue monitor   Stroke like symptoms s/p TNK Code Stroke CT Head No evidence of acute intracranial hemorrhage or acute infarct. Known pituitary macroadenoma, incompletely assessed on this non-contrast head CT, but grossly unchanged from the prior brain MRI of 04/06/2022.Advanced chronic small vessel image changes within the cerebral white matter. Mild generalized cerebral atrophy. ASPECTS 10. CTA head & neck shows no emergent large vessel occlusion. No hemodynamically significant stenosis in the neck. Redemonstrated pituitary macroadenoma, which invades the right cavernous sinus. CT repeat no acute finding MRI showed no acute intracranial pathology. Stable known pituitary macro adenoma. 2D Echo EF 60-65% CTA chest No acute findings. No evidence of pneumonia or aspiration pneumonitis. Coronary artery calcifications and aortic atherosclerosis. LE VAS [place] no evidence of deep vein thrombosis seen in the lower extremities bilaterally.   LDL 73 HgbA1c 5.8 VTE prophylaxis - SCDs No antithrombotic prior to admission, now on ASA 81 EEG mild diffuse slowing indicative of global cerebral dysfunction  Therapy recommendations:  HH PT Disposition:  pending    Isolated Afib with RVR Isolated Aflutter  Sinus tachycardia Likely isolated events due to fever and infectious process Will continue to monitor Transient in the setting of fever and infection, no need to consider anticoagulation Off Cardizem IV Repeat EKG sinus tachy x 2 Now on propanolol    Hypertension Home meds: amlodipine, losartan. Held on admission. Stable Long-term BP goal normotensive   Hyperlipidemia Home meds: pravastatin 40 mg, held hospital LDL 73, goal < 70 On home pravastatin Continue statin on ischarge   Fever with leukocytosis, resolved WBC 12.8->14.7->18.7->14.9->10.3->8.5 Tmax 103->102.2->afebrile UA neg CXR neg Blood culture  NGTD CSF unremarkable for infection continue to trend WBC, fever curve Was treated with zosyn and azithromycin   AKI Cr 1.96 > 1.63 > 1.97->2.0->1.72 IVF decrease from 100 mL to 75 mL -> off S/p 1 dose furosemide 40 mg given wheezing on exam   Sundowning  Delirium at evenings On seroquel Qhs Off precedex     Other Stroke Risk Factors Advanced Age >/= 35    Other Active Problems Gout and RA Continue home etanercept and allopurinol and prednisone. GERD: Continue home pantoprazole: Insomnia Continue home trazodone. Anxiety and depression Continue escitalopram  DISCHARGE EXAM Blood pressure 129/75, pulse 74, temperature (!) 97.3 F (36.3 C), temperature source Oral, resp. rate 17, height '5\' 8"'$  (1.727 m), SpO2 96 %.  PHYSICAL EXAM General: in no acute distress HEENT: normocephalic and atraumatic Cardiovascular: tachycardic and irregular Respiratory: normal respiratory effort.  Gastrointestinal: non-tender and non-distended Extremities: moving all extremities spontaneously   Neuro - patient alert and oriented x4. Following commands appropriately. Moving all extremities spontaneously. No drift seen. No c/o numbness or tingling, sensation symmetric throughout.   Discharge Diet       Diet   Diet regular Room service appropriate? Yes with Assist; Fluid consistency: Thin   liquids  DISCHARGE PLAN Disposition:  Home with Home Health PT/OT aspirin 81 mg daily for secondary stroke prevention Ongoing stroke risk factor control by Primary Care Physician at time of discharge Follow-up PCP Deon Pilling, NP in 2 weeks. Follow-up with Dr. Jaynee Eagles in 4 weeks and ENT on 09/06/22.  Follow up with Dr. Arnoldo Morale on 09/29/22  45 minutes were spent preparing discharge.   Pt seen by  Neuro NP/APP and later by MD. Note/plan to be edited by MD as needed.    Otelia Santee, DNP, AGACNP-BC [facility] Please use AMION for pager and EPIC for messaging  ATTENDING NOTE: I reviewed  above note and agree with the assessment and plan.   Pt mental status at baseline. No focal neuro deficit. No fever, HR normal now after propranolol. Cre trending down, leukocytosis has resolved. On ASA and home statin. Pt is ready for discharge. He will follow up with Dr. Arnoldo Morale on 1/25 and with ENT on 09/06/22. Will also follow up with Dr. Jaynee Eagles at [facility] in 4 weeks.   For detailed assessment and plan, please refer to above/below as I have made changes wherever appropriate.   Rosalin Hawking, MD PhD Stroke Neurology 09/01/2022 4:01 PM

## 2022-09-06 DIAGNOSIS — J342 Deviated nasal septum: Secondary | ICD-10-CM | POA: Diagnosis not present

## 2022-09-06 DIAGNOSIS — D497 Neoplasm of unspecified behavior of endocrine glands and other parts of nervous system: Secondary | ICD-10-CM | POA: Diagnosis not present

## 2022-09-06 DIAGNOSIS — D352 Benign neoplasm of pituitary gland: Secondary | ICD-10-CM | POA: Diagnosis not present

## 2022-09-08 DIAGNOSIS — I10 Essential (primary) hypertension: Secondary | ICD-10-CM | POA: Diagnosis not present

## 2022-09-08 DIAGNOSIS — D352 Benign neoplasm of pituitary gland: Secondary | ICD-10-CM | POA: Diagnosis not present

## 2022-09-08 DIAGNOSIS — R519 Headache, unspecified: Secondary | ICD-10-CM | POA: Diagnosis not present

## 2022-09-08 DIAGNOSIS — Z09 Encounter for follow-up examination after completed treatment for conditions other than malignant neoplasm: Secondary | ICD-10-CM | POA: Diagnosis not present

## 2022-09-09 LAB — MISC LABCORP TEST (SEND OUT): Labcorp test code: 16583

## 2022-09-26 ENCOUNTER — Other Ambulatory Visit: Payer: Self-pay | Admitting: Otolaryngology

## 2022-09-29 ENCOUNTER — Inpatient Hospital Stay: Admit: 2022-09-29 | Payer: Medicare Other | Admitting: Neurosurgery

## 2022-09-29 SURGERY — CRANIOTOMY HYPOPHYSECTOMY TRANSNASAL APPROACH
Anesthesia: General

## 2022-10-07 DIAGNOSIS — M47816 Spondylosis without myelopathy or radiculopathy, lumbar region: Secondary | ICD-10-CM | POA: Diagnosis not present

## 2022-10-07 DIAGNOSIS — M791 Myalgia, unspecified site: Secondary | ICD-10-CM | POA: Diagnosis not present

## 2022-10-10 ENCOUNTER — Other Ambulatory Visit: Payer: Self-pay | Admitting: Adult Health

## 2022-10-10 NOTE — Progress Notes (Signed)
Order initially to be placed to schedule repeat MRI brain for surveillance monitoring of pituitary macroadenoma but per chart review, completed MRI brain 08/28/2022 which showed stable appearance of pituitary macroadenoma. He is currently scheduled for transsphenoidal resection of pituitary macroadenoma with Dr. Arnoldo Morale on 10/20/2022.  No indication at this time for repeat imaging.

## 2022-10-11 ENCOUNTER — Ambulatory Visit: Payer: Medicare Other | Admitting: Neurology

## 2022-10-11 NOTE — Progress Notes (Signed)
Surgical Instructions    Your procedure is scheduled on Thursday February 15th.  Report to [facility] Main Entrance "A" at 5:30 A.M., then check in with the Admitting office.  Call this number if you have problems the morning of surgery:  [phone]   If you have any questions prior to your surgery date call [phone]: Open Monday-Friday 8am-4pm If you experience any cold or flu symptoms such as cough, fever, chills, shortness of breath, etc. between now and your scheduled surgery, please notify us at the above number     Remember:  Do not eat after midnight the night before your surgery  You may drink clear liquids until 4:30am the morning of your surgery.   Clear liquids allowed are: Water, Non-Citrus Juices (without pulp), Carbonated Beverages, Clear Tea, Black Coffee ONLY (NO MILK, CREAM OR POWDERED CREAMER of any kind), and Gatorade    Take these medicines the morning of surgery with A SIP OF WATER: allopurinol (ZYLOPRIM) 300 MG tablet  amLODipine (NORVASC) 5 MG tablet  pravastatin (PRAVACHOL) 40 MG tablet  predniSONE (DELTASONE) 5 MG tablet  propranolol ER (INDERAL LA) 60 MG 24 hr capsule  traMADol-acetaminophen (ULTRACET) 37.5-325 MG tablet   IF NEEDED  acetaminophen (TYLENOL) 500 MG tablet   Follow your surgeon's instructions on when to stop Aspirin.  If no instructions were given by your surgeon then you will need to call the office to get those instructions.     As of today, STOP taking any Aspirin (unless otherwise instructed by your surgeon) Aleve, Naproxen, Ibuprofen, Motrin, Advil, Goody's, BC's, all herbal medications, fish oil, and all vitamins.           Do not wear jewelry  Do not wear lotions, powders, cologne or deodorant. Do not shave 48 hours prior to surgery.  Men may shave face and neck. Do not bring valuables to the hospital. Do not wear nail polish  [facility] is not responsible for any belongings or valuables.    Do NOT Smoke  (Tobacco/Vaping)  24 hours prior to your procedure  If you use a CPAP at night, you may bring your mask for your overnight stay.   Contacts, glasses, hearing aids, dentures or partials may not be worn into surgery, please bring cases for these belongings   For patients admitted to the hospital, discharge time will be determined by your treatment team.   Patients discharged the day of surgery will not be allowed to drive home, and someone needs to stay with them for 24 hours.   SURGICAL WAITING ROOM VISITATION Patients having surgery or a procedure may have no more than 2 support people in the waiting area - these visitors may rotate.   Children under the age of 7 must have an adult with them who is not the patient. If the patient needs to stay at the hospital during part of their recovery, the visitor guidelines for inpatient rooms apply. Pre-op nurse will coordinate an appropriate time for 1 support person to accompany patient in pre-op.  This support person may not rotate.   Please refer to [email or web address] for the visitor guidelines for Inpatients (after your surgery is over and you are in a regular room).    Special instructions:    Oral Hygiene is also important to reduce your risk of infection.  Remember - BRUSH YOUR TEETH THE MORNING OF SURGERY WITH YOUR REGULAR TOOTHPASTE   [place]- Preparing For Surgery  Before surgery, you can play an important role. Because  skin is not sterile, your skin needs to be as free of germs as possible. You can reduce the number of germs on your skin by washing with CHG (chlorahexidine gluconate) Soap before surgery.  CHG is an antiseptic cleaner which kills germs and bonds with the skin to continue killing germs even after washing.     Please do not use if you have an allergy to CHG or antibacterial soaps. If your skin becomes reddened/irritated stop using the CHG.  Do not shave (including  legs and underarms) for at least 48 hours prior to first CHG shower. It is OK to shave your face.  Please follow these instructions carefully.     Shower the NIGHT BEFORE SURGERY and the MORNING OF SURGERY with CHG Soap.   If you chose to wash your hair, wash your hair first as usual with your normal shampoo. After you shampoo, rinse your hair and body thoroughly to remove the shampoo.  Then [employer] and genitals (private parts) with your normal soap and rinse thoroughly to remove soap.  After that Use CHG Soap as you would any other liquid soap. You can apply CHG directly to the skin and wash gently with a scrungie or a clean washcloth.   Apply the CHG Soap to your body ONLY FROM THE NECK DOWN.  Do not use on open wounds or open sores. Avoid contact with your eyes, ears, mouth and genitals (private parts). Wash Face and genitals (private parts)  with your normal soap.   Wash thoroughly, paying special attention to the area where your surgery will be performed.  Thoroughly rinse your body with warm water from the neck down.  DO NOT shower/wash with your normal soap after using and rinsing off the CHG Soap.  Pat yourself dry with a CLEAN TOWEL.  Wear CLEAN PAJAMAS to bed the night before surgery  Place CLEAN SHEETS on your bed the night before your surgery  DO NOT SLEEP WITH PETS.   Day of Surgery:  Take a shower with CHG soap. Wear Clean/Comfortable clothing the morning of surgery Do not apply any deodorants/lotions.   Remember to brush your teeth WITH YOUR REGULAR TOOTHPASTE.    If you received a COVID test during your pre-op visit, it is requested that you wear a mask when out in public, stay away from anyone that may not be feeling well, and notify your surgeon if you develop symptoms. If you have been in contact with anyone that has tested positive in the last 10 days, please notify your surgeon.    Please read over the following fact sheets that you were given.

## 2022-10-12 ENCOUNTER — Encounter (HOSPITAL_COMMUNITY)
Admission: RE | Admit: 2022-10-12 | Discharge: 2022-10-12 | Disposition: A | Discharge status: Home / Self Care | Payer: Medicare Other | Source: Ambulatory Visit | Attending: Neurosurgery | Admitting: Neurosurgery

## 2022-10-12 ENCOUNTER — Encounter (HOSPITAL_COMMUNITY): Payer: Self-pay

## 2022-10-12 ENCOUNTER — Other Ambulatory Visit: Payer: Self-pay

## 2022-10-12 VITALS — BP 139/78 | HR 73 | Temp 97.5°F | Resp 17 | Ht 68.0 in | Wt 153.4 lb

## 2022-10-12 DIAGNOSIS — Z01812 Encounter for preprocedural laboratory examination: Secondary | ICD-10-CM | POA: Insufficient documentation

## 2022-10-12 DIAGNOSIS — M069 Rheumatoid arthritis, unspecified: Secondary | ICD-10-CM | POA: Insufficient documentation

## 2022-10-12 DIAGNOSIS — K219 Gastro-esophageal reflux disease without esophagitis: Secondary | ICD-10-CM | POA: Diagnosis not present

## 2022-10-12 DIAGNOSIS — E785 Hyperlipidemia, unspecified: Secondary | ICD-10-CM | POA: Diagnosis not present

## 2022-10-12 DIAGNOSIS — D352 Benign neoplasm of pituitary gland: Secondary | ICD-10-CM | POA: Diagnosis not present

## 2022-10-12 DIAGNOSIS — N183 Chronic kidney disease, stage 3 unspecified: Secondary | ICD-10-CM | POA: Diagnosis not present

## 2022-10-12 DIAGNOSIS — I129 Hypertensive chronic kidney disease with stage 1 through stage 4 chronic kidney disease, or unspecified chronic kidney disease: Secondary | ICD-10-CM | POA: Insufficient documentation

## 2022-10-12 DIAGNOSIS — Z87891 Personal history of nicotine dependence: Secondary | ICD-10-CM | POA: Insufficient documentation

## 2022-10-12 DIAGNOSIS — I10 Essential (primary) hypertension: Secondary | ICD-10-CM

## 2022-10-12 HISTORY — DX: Cardiac arrhythmia, unspecified: I49.9

## 2022-10-12 LAB — BASIC METABOLIC PANEL
Anion gap: 10 (ref 5–15)
BUN: 47 mg/dL — ABNORMAL HIGH (ref 8–23)
CO2: 28 mmol/L (ref 22–32)
Calcium: 8.9 mg/dL (ref 8.9–10.3)
Chloride: 99 mmol/L (ref 98–111)
Creatinine, Ser: 1.87 mg/dL — ABNORMAL HIGH (ref 0.61–1.24)
GFR, Estimated: 36 mL/min — ABNORMAL LOW (ref >60)
Glucose, Bld: 117 mg/dL — ABNORMAL HIGH (ref 70–99)
Potassium: 4.1 mmol/L (ref 3.5–5.1)
Sodium: 137 mmol/L (ref 135–145)

## 2022-10-12 LAB — CBC
HCT: 38.9 % — ABNORMAL LOW (ref 39.0–52.0)
Hemoglobin: 12.8 g/dL — ABNORMAL LOW (ref 13.0–17.0)
MCH: 30.1 pg (ref 26.0–34.0)
MCHC: 32.9 g/dL (ref 30.0–36.0)
MCV: 91.5 fL (ref 80.0–100.0)
Platelets: 283 10*3/uL (ref 150–400)
RBC: 4.25 MIL/uL (ref 4.22–5.81)
RDW: 14.8 % (ref 11.5–15.5)
WBC: 9.7 10*3/uL (ref 4.0–10.5)
nRBC: 0 % (ref 0.0–0.2)

## 2022-10-12 NOTE — Progress Notes (Addendum)
PCP - Deon Pilling NP Cardiologist - Denies  PPM/ICD - Denies  Chest x-ray - NI EKG - 08/30/22 Stress Test - Denies ECHO - Denies Cardiac Cath - Denies  Sleep Study - Denies  DM - Denies  Blood Thinner Instructions:N/A Aspirin Instructions: Requested patient to call surgeon for instruction  ERAS Protcol - Yes  COVID TEST- N/A   Anesthesia review: Yes creatinine 1.87  Patient denies shortness of breath, fever, cough and chest pain at PAT appointment   All instructions explained to the patient, with a verbal understanding of the material. Patient agrees to go over the instructions while at home for a better understanding.  The opportunity to ask questions was provided.

## 2022-10-13 NOTE — Progress Notes (Addendum)
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  History includes former smoker (quit 09/05/78), HTN, HLD, CKD (stage 3), GERD, RA ("migratory RA", psoriatic arthritis), spinal surgery (x2), pituitary macroadenoma.  He was diagnosed with a pituitary macroadenoma in April 2023 after he presented to his ophthalmologist with severe headache, pain behind his eyes with sensitivity to light and sound. He was disoriented in the office and sent to the ED. 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 He was treated with a migraine cocktail. He subsequently had MRI imaging that revealed an enhancing lesion in the sella measuring up to 2.0 cm with suprasellar extension consistent with a pituitary macroadenoma. Neurology, Neurosurgery and opthalmology consulted. Neurosurgery did not think immediate surgical intervention was indicated. Out-patient follow-up planned including with endocrinology.   Admission in May 2023 for acute diverticulitis, acute on chronic kidney disease, and acute metabolic encephalopathy.   Last admission by neurology 08/26/22-09/01/22 for acute metabolic encephalopathy due to unspecified infection. He became disoriented at [employer]. Initially concerned for CVA but head CT and MRI negative for acute  infarct. Stable known pituitary macroadenoma which invaded the right cavernous sinus. EEG mild diffuse slowing indicative of global cerebral dysfunction. WBC elevated (peak 18.7K) with Tmax of 103, but unclear source of infection as UA negative, CXR negative, blood cultures negative, respiratory panel negative, and CSF unremarkable for infection. He did have sinus tachycardia then isolated afib with RVR->aflutter in setting of fever and infection. Appears he converted within hours on Cardizem and then changed to propanolol. Anticoagulation was not recommended. Echo showed normal LVEF 60-65%, no regional wall motion abnormalities, normal RSVF. Last EKG during his admission was on 08/30/22 and showed ST at 107 bpm, ST elevation in lateral leads, more prominent in lead I. He was treated with Zosyn and azithromycin. He improved, fevers resolved and WBC normalized. Discharged with neurosurgery, ENT, neurology follow-up.   Reviewed with anesthesiologist Nolon Nations, MD. Patient denied chest pain and SOB per PAT RN interview. Episode of afib/flutter in setting of acute infection of unknown source in December 2023. Reassuring echo then. He has a pituitary macroadenoma, possibly symptomatic. Anesthesia team to evaluate him on the day of surgery. Will update EKG preoperatively. Of note he is on daily prednisone 5 mg (appears he has been on chronically for RA/psoriatic arthritis). His renal function appears stable with Creatinine 1.87. H/H 12.8/38.9, but given type of surgery will order a T&S.    VS: BP 139/78   Pulse 73   Temp (!) 36.4 C   Resp 17   Ht 5' 8"$  (1.727 m)   Wt 69.6 kg   SpO2 100%   BMI 23.32 kg/m    PROVIDERS: Deon Pilling, NP is PCP [facility]). 08/22/22 office note scanned under Media tab. She notes  that despite stable pituitary tumor, he believes his severe headaches are related to his macroadenoma and is relieved to be scheduled for surgery.  Sarina Ill, MD is  neurologist Loni Beckwith, MD is endocrinologist Rutherford Guys, MD is ophthalmologist   LABS: Labs reviewed: Acceptable for surgery. Cr 1.87 but appears within his baseline dating back to 2019. (all labs ordered are listed, but only abnormal results are displayed)  Labs Reviewed  BASIC METABOLIC PANEL - Abnormal; Notable for the following components:      Result Value   Glucose, Bld 117 (*)    BUN 47 (*)    Creatinine, Ser 1.87 (*)    GFR, Estimated 36 (*)    All other components within normal limits  CBC - Abnormal; Notable for the following components:   Hemoglobin 12.8 (*)    HCT 38.9 (*)    All other components within normal limits    OTHER: EEG 08/27/22: mpression and clinical correlation: This EEG was obtained while awake and drowsy and is abnormal due to mild diffuse slowing indicative of global cerebral dysfunction. Epileptiform abnormalities were not seen during this recording.    IMAGES: CT Paranasal Sinuses 09/06/22 ([facility]): IMPRESSION:  Normally aerated paranasal sinuses.  Patent sinus drainage pathways.   CTA Chest 08/29/22: IMPRESSION: 1. No acute findings. No evidence of pneumonia or aspiration pneumonitis. 2. Coronary artery calcifications and aortic atherosclerosis.   MRI Brain 08/28/22: IMPRESSION: 1. No acute intracranial pathology. Stable background chronic small-vessel ischemic change. 2. Stable known pituitary macro adenoma. - The known pituitary macro adenoma measuring up to 1.4 cm cc is stable without evidence of mass effect on the optic chiasm.   CT Head/CTA Head & Neck 08/26/22: IMPRESSION: 1. No acute intracranial abnormality. 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 2. No emergent large vessel occlusion. 3. No hemodynamically significant stenosis in the neck. 4. Redemonstrated pituitary macroadenoma, which invades the right cavernous sinus.    EKG: EKG 08/26/22-08/30/22 during admission for acute illness with  infection of unknown source.   EKG 08/30/22:  Sinus tachycardia at 107 bpm ST elevation now present in Lateral leads Confirmed by Lars Mage [phone]) on 08/30/2022 6:26:27 PM  EKG 08/29/22: Atrial flutter with variable A-V block Incomplete right bundle branch block Left anterior fascicular block Abnormal ECG When compared with ECG of 29-Aug-2022 11:31, Atrial flutter has replaced Sinus rhythm  EKG 08/28/22:  Atrial fibrillation with rapid ventricular response Left anterior fascicular block Junctional ST depression, probably normal Abnormal ECG   CV: Echo 08/26/22: IMPRESSIONS   1. Left ventricular ejection fraction, by estimation, is 60 to 65%. The  left ventricle has normal function. The left ventricle has no regional  wall motion abnormalities. Indeterminate diastolic filling due to E-A  fusion.   2. Right ventricular systolic function is normal. The right ventricular  size is normal.   3. No evidence of mitral valve regurgitation.   4. The aortic valve was not well visualized. Aortic valve regurgitation  is not visualized.   5. The inferior vena cava is normal in size with greater than 50%  respiratory variability, suggesting right atrial pressure of 3 mmHg.  - Comparison(s): No prior Echocardiogram.    Past Medical History:  Diagnosis Date   Anxiety and depression    Chronic renal insufficiency, stage 3 (moderate) (HCC)    DDD (degenerative disc disease), lumbar    remote hx of fusion surgery   Dysrhythmia    isolated afib/flutter   GERD (gastroesophageal reflux disease)  Gout    History of stomach ulcers 2000   Hyperlipidemia    Hypertension    NAUSEA WITH VOMITING 10/15/2010   Rheumatoid arthritis ([place])    Rheumatoid     Past Surgical History:  Procedure Laterality Date   APPENDECTOMY  1968   back fusion  2000   CHOLECYSTECTOMY N/A 06/06/2017   Procedure: LAPAROSCOPIC CHOLECYSTECTOMY WITH INTRAOPERATIVE CHOLANGIOGRAM;  Surgeon: [name],  MD;  Location: [facility];  Service: General;  Laterality: N/A;   EYE SURGERY     Dr. [name]  lens implants   LUMBAR LAMINECTOMY  2000   TIBIA FRACTURE SURGERY Left 1998   with titanium rods   UMBILICAL HERNIA REPAIR N/A 06/06/2017   Procedure: UMBILICAL HERNIA REPAIR;  Surgeon: [name], MD;  Location: [facility];  Service: General;  Laterality: N/A;    MEDICATIONS:  acetaminophen (TYLENOL) 500 MG tablet   allopurinol (ZYLOPRIM) 300 MG tablet   amLODipine (NORVASC) 5 MG tablet   aspirin EC 81 MG tablet   escitalopram (LEXAPRO) 20 MG tablet   folic acid (FOLVITE) 1 MG tablet   loperamide (IMODIUM) 2 MG capsule   losartan-hydrochlorothiazide (HYZAAR) 100-25 MG tablet   pantoprazole (PROTONIX) 40 MG tablet   pravastatin (PRAVACHOL) 40 MG tablet   predniSONE (DELTASONE) 5 MG tablet   propranolol ER (INDERAL LA) 60 MG 24 hr capsule   testosterone cypionate (DEPOTESTOSTERONE CYPIONATE) 200 MG/ML injection   traMADol-acetaminophen (ULTRACET) 37.5-325 MG tablet   traZODone (DESYREL) 50 MG tablet   No current facility-administered medications for this encounter.    Myra Gianotti, PA-C Surgical Short Stay/Anesthesiology [facility] Phone [phone] [facility] Phone [phone] 10/14/2022 11:48 AM

## 2022-10-14 ENCOUNTER — Encounter (HOSPITAL_COMMUNITY): Payer: Self-pay

## 2022-10-14 NOTE — Anesthesia Preprocedure Evaluation (Signed)
Anesthesia Evaluation  Patient identified by MRN, date of birth, ID band Patient awake    Reviewed: Allergy & Precautions, NPO status , Patient's Chart, lab work & pertinent test results  Airway Mallampati: I  TM Distance: >3 FB Neck ROM: Full    Dental  (+) Edentulous Upper, Dental Advisory Given   Pulmonary former smoker    + decreased breath sounds      Cardiovascular hypertension, + dysrhythmias Atrial Fibrillation  Rhythm:Regular Rate:Normal  Echo 08/26/22: IMPRESSIONS  1. Left ventricular ejection fraction, by estimation, is 60 to 65%. The  left ventricle has normal function. The left ventricle has no regional  wall motion abnormalities. Indeterminate diastolic filling due to E-A  fusion.  2. Right ventricular systolic function is normal. The right ventricular  size is normal.  3. No evidence of mitral valve regurgitation.  4. The aortic valve was not well visualized. Aortic valve regurgitation  is not visualized.  5. The inferior vena cava is normal in size with greater than 50%  respiratory variability, suggesting right atrial pressure of 3 mmHg.     Neuro/Psych  Headaches PSYCHIATRIC DISORDERS Anxiety Depression       GI/Hepatic Neg liver ROS, PUD,GERD  ,,  Endo/Other  negative endocrine ROS    Renal/GU Renal disease     Musculoskeletal  (+) Arthritis , Rheumatoid disorders,    Abdominal   Peds  Hematology negative hematology ROS (+)   Anesthesia Other Findings   Reproductive/Obstetrics                             Anesthesia Physical Anesthesia Plan  ASA: 3  Anesthesia Plan: General   Post-op Pain Management: Ofirmev IV (intra-op)*   Induction: Intravenous  PONV Risk Score and Plan: 3 and Ondansetron, Dexamethasone and Treatment may vary due to age or medical condition  Airway Management Planned: Oral ETT  Additional Equipment: Arterial line  Intra-op Plan:    Post-operative Plan: Extubation in OR  Informed Consent: I have reviewed the patients History and Physical, chart, labs and discussed the procedure including the risks, benefits and alternatives for the proposed anesthesia with the patient or authorized representative who has indicated his/her understanding and acceptance.     Dental advisory given  Plan Discussed with: CRNA  Anesthesia Plan Comments: (PAT note written 10/14/2022 by Myra Gianotti, PA-C. For updated EKG on the day of surgery.   )       Anesthesia Quick Evaluation

## 2022-10-20 ENCOUNTER — Other Ambulatory Visit: Payer: Self-pay

## 2022-10-20 ENCOUNTER — Inpatient Hospital Stay (HOSPITAL_COMMUNITY): Payer: Medicare Other | Admitting: Vascular Surgery

## 2022-10-20 ENCOUNTER — Inpatient Hospital Stay (HOSPITAL_COMMUNITY)
Admission: RE | Admit: 2022-10-20 | Discharge: 2022-10-22 | DRG: 615 | Disposition: A | Discharge status: Home / Self Care | Payer: Medicare Other | Source: Ambulatory Visit | Attending: Neurosurgery | Admitting: Neurosurgery

## 2022-10-20 ENCOUNTER — Inpatient Hospital Stay (HOSPITAL_COMMUNITY)
Admission: RE | Disposition: A | Discharge status: Home / Self Care | Payer: Self-pay | Source: Ambulatory Visit | Attending: Neurosurgery

## 2022-10-20 ENCOUNTER — Encounter (HOSPITAL_COMMUNITY): Payer: Self-pay | Admitting: Neurosurgery

## 2022-10-20 DIAGNOSIS — J342 Deviated nasal septum: Secondary | ICD-10-CM | POA: Diagnosis present

## 2022-10-20 DIAGNOSIS — J3489 Other specified disorders of nose and nasal sinuses: Secondary | ICD-10-CM | POA: Diagnosis not present

## 2022-10-20 DIAGNOSIS — D352 Benign neoplasm of pituitary gland: Secondary | ICD-10-CM | POA: Diagnosis not present

## 2022-10-20 DIAGNOSIS — Z8711 Personal history of peptic ulcer disease: Secondary | ICD-10-CM | POA: Diagnosis not present

## 2022-10-20 DIAGNOSIS — M069 Rheumatoid arthritis, unspecified: Secondary | ICD-10-CM | POA: Diagnosis present

## 2022-10-20 DIAGNOSIS — D3A8 Other benign neuroendocrine tumors: Secondary | ICD-10-CM | POA: Diagnosis not present

## 2022-10-20 DIAGNOSIS — N183 Chronic kidney disease, stage 3 unspecified: Secondary | ICD-10-CM | POA: Diagnosis not present

## 2022-10-20 DIAGNOSIS — K219 Gastro-esophageal reflux disease without esophagitis: Secondary | ICD-10-CM | POA: Diagnosis not present

## 2022-10-20 DIAGNOSIS — Z7982 Long term (current) use of aspirin: Secondary | ICD-10-CM

## 2022-10-20 DIAGNOSIS — Z87891 Personal history of nicotine dependence: Secondary | ICD-10-CM

## 2022-10-20 DIAGNOSIS — E785 Hyperlipidemia, unspecified: Secondary | ICD-10-CM | POA: Diagnosis present

## 2022-10-20 DIAGNOSIS — Z9049 Acquired absence of other specified parts of digestive tract: Secondary | ICD-10-CM

## 2022-10-20 DIAGNOSIS — Z79891 Long term (current) use of opiate analgesic: Secondary | ICD-10-CM

## 2022-10-20 DIAGNOSIS — Z79899 Other long term (current) drug therapy: Secondary | ICD-10-CM

## 2022-10-20 DIAGNOSIS — F32A Depression, unspecified: Secondary | ICD-10-CM | POA: Diagnosis present

## 2022-10-20 DIAGNOSIS — Z7952 Long term (current) use of systemic steroids: Secondary | ICD-10-CM | POA: Diagnosis not present

## 2022-10-20 DIAGNOSIS — Z981 Arthrodesis status: Secondary | ICD-10-CM | POA: Diagnosis not present

## 2022-10-20 DIAGNOSIS — F419 Anxiety disorder, unspecified: Secondary | ICD-10-CM | POA: Diagnosis present

## 2022-10-20 DIAGNOSIS — I129 Hypertensive chronic kidney disease with stage 1 through stage 4 chronic kidney disease, or unspecified chronic kidney disease: Secondary | ICD-10-CM | POA: Diagnosis not present

## 2022-10-20 DIAGNOSIS — Z01818 Encounter for other preprocedural examination: Secondary | ICD-10-CM

## 2022-10-20 DIAGNOSIS — I4891 Unspecified atrial fibrillation: Secondary | ICD-10-CM | POA: Diagnosis not present

## 2022-10-20 DIAGNOSIS — Z79890 Hormone replacement therapy (postmenopausal): Secondary | ICD-10-CM | POA: Diagnosis not present

## 2022-10-20 DIAGNOSIS — I10 Essential (primary) hypertension: Secondary | ICD-10-CM

## 2022-10-20 DIAGNOSIS — R9431 Abnormal electrocardiogram [ECG] [EKG]: Principal | ICD-10-CM

## 2022-10-20 DIAGNOSIS — M109 Gout, unspecified: Secondary | ICD-10-CM | POA: Diagnosis present

## 2022-10-20 HISTORY — PX: NASAL SEPTOPLASTY W/ TURBINOPLASTY: SHX2070

## 2022-10-20 HISTORY — PX: CRANIOTOMY: SHX93

## 2022-10-20 HISTORY — PX: TRANSPHENOIDAL APPROACH EXPOSURE: SHX6311

## 2022-10-20 LAB — POCT I-STAT 7, (LYTES, BLD GAS, ICA,H+H)
Acid-Base Excess: 2 mmol/L (ref 0.0–2.0)
Bicarbonate: 27.4 mmol/L (ref 20.0–28.0)
Calcium, Ion: 1.17 mmol/L (ref 1.15–1.40)
HCT: 28 % — ABNORMAL LOW (ref 39.0–52.0)
Hemoglobin: 9.5 g/dL — ABNORMAL LOW (ref 13.0–17.0)
O2 Saturation: 100 %
Patient temperature: 34.1
Potassium: 4.7 mmol/L (ref 3.5–5.1)
Sodium: 136 mmol/L (ref 135–145)
TCO2: 29 mmol/L (ref 22–32)
pCO2 arterial: 41.3 mmHg (ref 32–48)
pH, Arterial: 7.418 (ref 7.35–7.45)
pO2, Arterial: 212 mmHg — ABNORMAL HIGH (ref 83–108)

## 2022-10-20 LAB — TYPE AND SCREEN
ABO/RH(D): B POS
Antibody Screen: NEGATIVE

## 2022-10-20 LAB — ABO/RH: ABO/RH(D): B POS

## 2022-10-20 SURGERY — CRANIOTOMY HYPOPHYSECTOMY TRANSNASAL APPROACH
Anesthesia: General

## 2022-10-20 MED ORDER — ESCITALOPRAM OXALATE 10 MG PO TABS
20.0000 mg | ORAL_TABLET | Freq: Every day | ORAL | Status: DC
  Administered 2022-10-20 – 2022-10-21 (×2): 20 mg via ORAL
  Filled 2022-10-20 (×2): qty 2

## 2022-10-20 MED ORDER — ONDANSETRON HCL 4 MG/2ML IJ SOLN
INTRAMUSCULAR | Status: DC | PRN
  Administered 2022-10-20: 4 mg via INTRAVENOUS

## 2022-10-20 MED ORDER — DEXAMETHASONE SODIUM PHOSPHATE 10 MG/ML IJ SOLN
INTRAMUSCULAR | Status: AC
  Filled 2022-10-20: qty 1

## 2022-10-20 MED ORDER — SODIUM CHLORIDE 0.9 % IV SOLN: INTRAVENOUS | Status: DC | PRN

## 2022-10-20 MED ORDER — LABETALOL HCL 5 MG/ML IV SOLN: 10.0000 mg | INTRAVENOUS | Status: DC | PRN

## 2022-10-20 MED ORDER — DOCUSATE SODIUM 100 MG PO CAPS
100.0000 mg | ORAL_CAPSULE | Freq: Two times a day (BID) | ORAL | Status: DC
  Administered 2022-10-20 – 2022-10-21 (×2): 100 mg via ORAL
  Filled 2022-10-20 (×4): qty 1

## 2022-10-20 MED ORDER — LIDOCAINE 2% (20 MG/ML) 5 ML SYRINGE
INTRAMUSCULAR | Status: DC | PRN
  Administered 2022-10-20: 40 mg via INTRAVENOUS

## 2022-10-20 MED ORDER — PHENYLEPHRINE 80 MCG/ML (10ML) SYRINGE FOR IV PUSH (FOR BLOOD PRESSURE SUPPORT)
PREFILLED_SYRINGE | INTRAVENOUS | Status: DC | PRN
  Administered 2022-10-20 (×2): 80 ug via INTRAVENOUS

## 2022-10-20 MED ORDER — PROMETHAZINE HCL 25 MG PO TABS: 12.5000 mg | ORAL_TABLET | ORAL | Status: DC | PRN

## 2022-10-20 MED ORDER — HEMOSTATIC AGENTS (NO CHARGE) OPTIME
TOPICAL | Status: DC | PRN
  Administered 2022-10-20: 1

## 2022-10-20 MED ORDER — CHLORHEXIDINE GLUCONATE CLOTH 2 % EX PADS
6.0000 | MEDICATED_PAD | Freq: Every day | CUTANEOUS | Status: DC
  Administered 2022-10-22: 6 via TOPICAL

## 2022-10-20 MED ORDER — ROCURONIUM BROMIDE 10 MG/ML (PF) SYRINGE
PREFILLED_SYRINGE | INTRAVENOUS | Status: AC
  Filled 2022-10-20: qty 10

## 2022-10-20 MED ORDER — PROPOFOL 10 MG/ML IV BOLUS
INTRAVENOUS | Status: DC | PRN
  Administered 2022-10-20: 30 mg via INTRAVENOUS
  Administered 2022-10-20: 130 mg via INTRAVENOUS

## 2022-10-20 MED ORDER — EPINEPHRINE HCL (NASAL) 0.1 % NA SOLN
NASAL | Status: AC
  Filled 2022-10-20: qty 90

## 2022-10-20 MED ORDER — HYDROCODONE-ACETAMINOPHEN 5-325 MG PO TABS
1.0000 | ORAL_TABLET | ORAL | Status: DC | PRN
  Administered 2022-10-20 – 2022-10-21 (×2): 2 via ORAL
  Filled 2022-10-20 (×2): qty 2

## 2022-10-20 MED ORDER — ORAL CARE MOUTH RINSE: 15.0000 mL | Freq: Once | OROMUCOSAL | Status: AC

## 2022-10-20 MED ORDER — ACETAMINOPHEN 500 MG PO TABS: 500.0000 mg | ORAL_TABLET | Freq: Four times a day (QID) | ORAL | Status: DC | PRN

## 2022-10-20 MED ORDER — CEFAZOLIN SODIUM-DEXTROSE 2-4 GM/100ML-% IV SOLN
2.0000 g | Freq: Three times a day (TID) | INTRAVENOUS | Status: AC
  Administered 2022-10-20 – 2022-10-21 (×2): 2 g via INTRAVENOUS
  Filled 2022-10-20 (×2): qty 100

## 2022-10-20 MED ORDER — AMISULPRIDE (ANTIEMETIC) 5 MG/2ML IV SOLN: 10.0000 mg | Freq: Once | INTRAVENOUS | Status: DC | PRN

## 2022-10-20 MED ORDER — THROMBIN 5000 UNITS EX SOLR
CUTANEOUS | Status: AC
  Filled 2022-10-20: qty 5000

## 2022-10-20 MED ORDER — FOLIC ACID 1 MG PO TABS
1.0000 mg | ORAL_TABLET | Freq: Every day | ORAL | Status: DC
  Administered 2022-10-20 – 2022-10-22 (×3): 1 mg via ORAL
  Filled 2022-10-20 (×3): qty 1

## 2022-10-20 MED ORDER — ADHERUS DURAL SEALANT
PACK | TOPICAL | Status: DC | PRN
  Administered 2022-10-20: 1 via TOPICAL

## 2022-10-20 MED ORDER — GLYCOPYRROLATE 0.2 MG/ML IJ SOLN
INTRAMUSCULAR | Status: DC | PRN
  Administered 2022-10-20: .2 mg via INTRAVENOUS

## 2022-10-20 MED ORDER — SUGAMMADEX SODIUM 200 MG/2ML IV SOLN
INTRAVENOUS | Status: DC | PRN
  Administered 2022-10-20: 200 mg via INTRAVENOUS

## 2022-10-20 MED ORDER — THROMBIN 5000 UNITS EX SOLR: OROMUCOSAL | Status: DC | PRN

## 2022-10-20 MED ORDER — FENTANYL CITRATE (PF) 100 MCG/2ML IJ SOLN
INTRAMUSCULAR | Status: AC
  Filled 2022-10-20: qty 2

## 2022-10-20 MED ORDER — ACETAMINOPHEN 10 MG/ML IV SOLN: 1000.0000 mg | Freq: Once | INTRAVENOUS | Status: DC | PRN

## 2022-10-20 MED ORDER — LIDOCAINE 2% (20 MG/ML) 5 ML SYRINGE
INTRAMUSCULAR | Status: AC
  Filled 2022-10-20: qty 5

## 2022-10-20 MED ORDER — HYDROCHLOROTHIAZIDE 25 MG PO TABS
25.0000 mg | ORAL_TABLET | Freq: Every day | ORAL | Status: DC
  Administered 2022-10-20 – 2022-10-22 (×3): 25 mg via ORAL
  Filled 2022-10-20 (×3): qty 1

## 2022-10-20 MED ORDER — PRAVASTATIN SODIUM 40 MG PO TABS
40.0000 mg | ORAL_TABLET | Freq: Every day | ORAL | Status: DC
  Administered 2022-10-21 – 2022-10-22 (×2): 40 mg via ORAL
  Filled 2022-10-20 (×2): qty 1

## 2022-10-20 MED ORDER — PHENYLEPHRINE HCL-NACL 20-0.9 MG/250ML-% IV SOLN
INTRAVENOUS | Status: AC
  Filled 2022-10-20: qty 500

## 2022-10-20 MED ORDER — OXYCODONE HCL 5 MG/5ML PO SOLN: 5.0000 mg | Freq: Once | ORAL | Status: DC | PRN

## 2022-10-20 MED ORDER — TRAMADOL-ACETAMINOPHEN 37.5-325 MG PO TABS
1.0000 | ORAL_TABLET | Freq: Every day | ORAL | Status: DC
  Administered 2022-10-21 – 2022-10-22 (×2): 1 via ORAL
  Filled 2022-10-20 (×2): qty 1

## 2022-10-20 MED ORDER — ROCURONIUM BROMIDE 10 MG/ML (PF) SYRINGE
PREFILLED_SYRINGE | INTRAVENOUS | Status: DC | PRN
  Administered 2022-10-20: 60 mg via INTRAVENOUS
  Administered 2022-10-20: 40 mg via INTRAVENOUS

## 2022-10-20 MED ORDER — LIDOCAINE-EPINEPHRINE 1 %-1:100000 IJ SOLN
INTRAMUSCULAR | Status: AC
  Filled 2022-10-20: qty 1

## 2022-10-20 MED ORDER — MORPHINE SULFATE (PF) 2 MG/ML IV SOLN
2.0000 mg | INTRAVENOUS | Status: DC | PRN
  Administered 2022-10-20: 2 mg via INTRAVENOUS
  Administered 2022-10-20: 4 mg via INTRAVENOUS
  Filled 2022-10-20: qty 1
  Filled 2022-10-20: qty 2

## 2022-10-20 MED ORDER — FENTANYL CITRATE (PF) 100 MCG/2ML IJ SOLN
25.0000 ug | INTRAMUSCULAR | Status: DC | PRN
  Administered 2022-10-20: 25 ug via INTRAVENOUS

## 2022-10-20 MED ORDER — ACETAMINOPHEN 650 MG RE SUPP: 650.0000 mg | RECTAL | Status: DC | PRN

## 2022-10-20 MED ORDER — OXYMETAZOLINE HCL 0.05 % NA SOLN
NASAL | Status: AC
  Filled 2022-10-20: qty 30

## 2022-10-20 MED ORDER — LACTATED RINGERS IV SOLN: INTRAVENOUS | Status: DC

## 2022-10-20 MED ORDER — EPINEPHRINE HCL (NASAL) 0.1 % NA SOLN
NASAL | Status: DC | PRN
  Administered 2022-10-20: 20 mL via NASAL

## 2022-10-20 MED ORDER — SALINE SPRAY 0.65 % NA SOLN
2.0000 | NASAL | Status: DC
  Administered 2022-10-20 – 2022-10-22 (×16): 2 via NASAL
  Filled 2022-10-20 (×2): qty 44

## 2022-10-20 MED ORDER — ACETAMINOPHEN 10 MG/ML IV SOLN
INTRAVENOUS | Status: DC | PRN
  Administered 2022-10-20: 1000 mg via INTRAVENOUS

## 2022-10-20 MED ORDER — LOSARTAN POTASSIUM-HCTZ 100-25 MG PO TABS: 1.0000 | ORAL_TABLET | Freq: Every day | ORAL | Status: DC

## 2022-10-20 MED ORDER — CHLORHEXIDINE GLUCONATE 0.12 % MT SOLN
15.0000 mL | Freq: Once | OROMUCOSAL | Status: AC
  Administered 2022-10-20: 15 mL via OROMUCOSAL
  Filled 2022-10-20: qty 15

## 2022-10-20 MED ORDER — PHENYLEPHRINE HCL-NACL 20-0.9 MG/250ML-% IV SOLN
INTRAVENOUS | Status: DC | PRN
  Administered 2022-10-20: 20 ug/min via INTRAVENOUS

## 2022-10-20 MED ORDER — TRAZODONE HCL 50 MG PO TABS
50.0000 mg | ORAL_TABLET | Freq: Every day | ORAL | Status: DC
  Administered 2022-10-20 – 2022-10-21 (×2): 50 mg via ORAL
  Filled 2022-10-20 (×2): qty 1

## 2022-10-20 MED ORDER — ALLOPURINOL 300 MG PO TABS
300.0000 mg | ORAL_TABLET | Freq: Every day | ORAL | Status: DC
  Administered 2022-10-21 – 2022-10-22 (×2): 300 mg via ORAL
  Filled 2022-10-20 (×2): qty 1

## 2022-10-20 MED ORDER — ACETAMINOPHEN 325 MG PO TABS
650.0000 mg | ORAL_TABLET | ORAL | Status: DC | PRN
  Administered 2022-10-20 – 2022-10-21 (×2): 650 mg via ORAL
  Filled 2022-10-20 (×2): qty 2

## 2022-10-20 MED ORDER — ONDANSETRON HCL 4 MG/2ML IJ SOLN: 4.0000 mg | INTRAMUSCULAR | Status: DC | PRN

## 2022-10-20 MED ORDER — POTASSIUM CHLORIDE IN NACL 20-0.9 MEQ/L-% IV SOLN
INTRAVENOUS | Status: DC
  Filled 2022-10-20 (×2): qty 1000

## 2022-10-20 MED ORDER — OXYMETAZOLINE HCL 0.05 % NA SOLN: 1.0000 | Freq: Two times a day (BID) | NASAL | Status: DC | PRN

## 2022-10-20 MED ORDER — THROMBIN (RECOMBINANT) 5000 UNITS EX SOLR: CUTANEOUS | Status: DC | PRN

## 2022-10-20 MED ORDER — LOSARTAN POTASSIUM 50 MG PO TABS
100.0000 mg | ORAL_TABLET | Freq: Every day | ORAL | Status: DC
  Administered 2022-10-20 – 2022-10-22 (×3): 100 mg via ORAL
  Filled 2022-10-20 (×3): qty 2

## 2022-10-20 MED ORDER — PREDNISONE 5 MG PO TABS
5.0000 mg | ORAL_TABLET | Freq: Every day | ORAL | Status: DC
  Administered 2022-10-21 – 2022-10-22 (×2): 5 mg via ORAL
  Filled 2022-10-20 (×2): qty 1

## 2022-10-20 MED ORDER — FENTANYL CITRATE (PF) 250 MCG/5ML IJ SOLN
INTRAMUSCULAR | Status: DC | PRN
  Administered 2022-10-20 (×3): 50 ug via INTRAVENOUS

## 2022-10-20 MED ORDER — 0.9 % SODIUM CHLORIDE (POUR BTL) OPTIME
TOPICAL | Status: DC | PRN
  Administered 2022-10-20: 2000 mL

## 2022-10-20 MED ORDER — PANTOPRAZOLE SODIUM 40 MG PO TBEC
40.0000 mg | DELAYED_RELEASE_TABLET | Freq: Every day | ORAL | Status: DC
  Administered 2022-10-20 – 2022-10-21 (×2): 40 mg via ORAL
  Filled 2022-10-20 (×2): qty 1

## 2022-10-20 MED ORDER — ACETAMINOPHEN 160 MG/5ML PO SOLN: 325.0000 mg | ORAL | Status: DC | PRN

## 2022-10-20 MED ORDER — ALBUMIN HUMAN 5 % IV SOLN: INTRAVENOUS | Status: DC | PRN

## 2022-10-20 MED ORDER — EPHEDRINE SULFATE-NACL 50-0.9 MG/10ML-% IV SOSY
PREFILLED_SYRINGE | INTRAVENOUS | Status: DC | PRN
  Administered 2022-10-20 (×2): 5 mg via INTRAVENOUS

## 2022-10-20 MED ORDER — OXYCODONE HCL 5 MG PO TABS: 5.0000 mg | ORAL_TABLET | Freq: Once | ORAL | Status: DC | PRN

## 2022-10-20 MED ORDER — ONDANSETRON HCL 4 MG PO TABS: 4.0000 mg | ORAL_TABLET | ORAL | Status: DC | PRN

## 2022-10-20 MED ORDER — AMLODIPINE BESYLATE 5 MG PO TABS
5.0000 mg | ORAL_TABLET | Freq: Every day | ORAL | Status: DC
  Administered 2022-10-21 – 2022-10-22 (×2): 5 mg via ORAL
  Filled 2022-10-20 (×2): qty 1

## 2022-10-20 MED ORDER — PHENYLEPHRINE 80 MCG/ML (10ML) SYRINGE FOR IV PUSH (FOR BLOOD PRESSURE SUPPORT)
PREFILLED_SYRINGE | INTRAVENOUS | Status: AC
  Filled 2022-10-20: qty 10

## 2022-10-20 MED ORDER — PROPRANOLOL HCL ER 60 MG PO CP24
60.0000 mg | ORAL_CAPSULE | Freq: Every day | ORAL | Status: DC
  Administered 2022-10-21 – 2022-10-22 (×2): 60 mg via ORAL
  Filled 2022-10-20 (×2): qty 1

## 2022-10-20 MED ORDER — PROPOFOL 10 MG/ML IV BOLUS
INTRAVENOUS | Status: AC
  Filled 2022-10-20: qty 20

## 2022-10-20 MED ORDER — FENTANYL CITRATE (PF) 250 MCG/5ML IJ SOLN
INTRAMUSCULAR | Status: AC
  Filled 2022-10-20: qty 5

## 2022-10-20 MED ORDER — LIDOCAINE-EPINEPHRINE 1 %-1:100000 IJ SOLN
INTRAMUSCULAR | Status: DC | PRN
  Administered 2022-10-20: 10 mL
  Administered 2022-10-20: 4 mL

## 2022-10-20 MED ORDER — THROMBIN 5000 UNITS EX SOLR
CUTANEOUS | Status: AC
  Filled 2022-10-20: qty 10000

## 2022-10-20 MED ORDER — ACETAMINOPHEN 10 MG/ML IV SOLN
INTRAVENOUS | Status: AC
  Filled 2022-10-20: qty 100

## 2022-10-20 MED ORDER — ONDANSETRON HCL 4 MG/2ML IJ SOLN
INTRAMUSCULAR | Status: AC
  Filled 2022-10-20: qty 2

## 2022-10-20 MED ORDER — PROMETHAZINE HCL 25 MG/ML IJ SOLN: 6.2500 mg | INTRAMUSCULAR | Status: DC | PRN

## 2022-10-20 MED ORDER — ACETAMINOPHEN 500 MG PO TABS
1000.0000 mg | ORAL_TABLET | Freq: Once | ORAL | Status: DC
  Filled 2022-10-20: qty 2

## 2022-10-20 MED ORDER — ACETAMINOPHEN 325 MG PO TABS: 325.0000 mg | ORAL_TABLET | ORAL | Status: DC | PRN

## 2022-10-20 MED ORDER — SODIUM CHLORIDE 0.9 % IR SOLN
Status: DC | PRN
  Administered 2022-10-20: 2000 mL

## 2022-10-20 MED ORDER — EPHEDRINE 5 MG/ML INJ
INTRAVENOUS | Status: AC
  Filled 2022-10-20: qty 5

## 2022-10-20 MED ORDER — DEXAMETHASONE SODIUM PHOSPHATE 10 MG/ML IJ SOLN
INTRAMUSCULAR | Status: DC | PRN
  Administered 2022-10-20: 10 mg via INTRAVENOUS

## 2022-10-20 MED ORDER — CEFAZOLIN SODIUM-DEXTROSE 2-4 GM/100ML-% IV SOLN
2.0000 g | INTRAVENOUS | Status: AC
  Administered 2022-10-20: 2 g via INTRAVENOUS
  Filled 2022-10-20: qty 100

## 2022-10-20 MED ORDER — PANTOPRAZOLE SODIUM 40 MG IV SOLR: 40.0000 mg | Freq: Every day | INTRAVENOUS | Status: DC

## 2022-10-20 SURGICAL SUPPLY — 123 items
APL SKNCLS STERI-STRIP NONHPOA (GAUZE/BANDAGES/DRESSINGS)
BAG COUNTER SPONGE SURGICOUNT (BAG) ×4 IMPLANT
BAG SPNG CNTER NS LX DISP (BAG) ×4
BALL CTTN LRG ABS STRL LF (GAUZE/BANDAGES/DRESSINGS)
BAND INSRT 18 STRL LF DISP RB (MISCELLANEOUS)
BAND RUBBER #18 3X1/16 STRL (MISCELLANEOUS) ×4 IMPLANT
BENZOIN TINCTURE PRP APPL 2/3 (GAUZE/BANDAGES/DRESSINGS) ×2 IMPLANT
BLADE INF TURB ROT M4 2 5PK (BLADE) ×3 IMPLANT
BLADE RAD40 ROTATE 4M 4 5PK (BLADE) IMPLANT
BLADE ROTATE TRICUT 4X13 M4 (BLADE) ×3 IMPLANT
BLADE SURG 10 STRL SS (BLADE) ×2 IMPLANT
BLADE SURG 11 STRL SS (BLADE) ×4 IMPLANT
BLADE SURG 15 STRL LF DISP TIS (BLADE) ×2 IMPLANT
BLADE SURG 15 STRL SS (BLADE)
BLADE ULTRA TIP 2M (BLADE) IMPLANT
BUR DIAMOND 13X5 70D (BURR) IMPLANT
BUR DIAMOND CURV 15X5 15D (BURR) IMPLANT
BUR MATCHSTICK NEURO 3.0 LAGG (BURR) IMPLANT
BUR TAPER CHOANAL ATRESIA 30K (BURR) ×2 IMPLANT
CABLE BIPOLOR RESECTION CORD (MISCELLANEOUS) ×2 IMPLANT
CANISTER SUCT 3000ML PPV (MISCELLANEOUS) ×6 IMPLANT
CATH ROBINSON RED A/P 14FR (CATHETERS) IMPLANT
COAGULATOR SUCT SWTCH 10FR 6 (ELECTROSURGICAL) IMPLANT
COTTONBALL LRG STERILE PKG (GAUZE/BANDAGES/DRESSINGS) IMPLANT
DEFOGGER MIRROR 1QT (MISCELLANEOUS) ×2 IMPLANT
DRAIN SUBARACHNOID (WOUND CARE) IMPLANT
DRAPE EENT ADH APERT 15X15 STR (DRAPES) IMPLANT
DRAPE HALF SHEET 40X57 (DRAPES) ×6 IMPLANT
DRAPE INCISE IOBAN 66X45 STRL (DRAPES) ×2 IMPLANT
DRAPE MICROSCOPE SLANT 54X150 (MISCELLANEOUS) ×2 IMPLANT
DRESSING NASAL POPE 10X1.5X2.5 (GAUZE/BANDAGES/DRESSINGS) IMPLANT
DRSG NASAL POPE 10X1.5X2.5 (GAUZE/BANDAGES/DRESSINGS)
ELECT CAUTERY BLADE 6.4 (BLADE) ×2 IMPLANT
ELECT COATED BLADE 2.86 ST (ELECTRODE) ×2 IMPLANT
ELECT NDL TIP 2.8 STRL (NEEDLE) ×2 IMPLANT
ELECT NEEDLE TIP 2.8 STRL (NEEDLE) IMPLANT
ELECT REM PT RETURN 9FT ADLT (ELECTROSURGICAL) ×4
ELECTRODE NDL INSULATED 6.5 (ELECTROSURGICAL) IMPLANT
ELECTRODE REM PT RTRN 9FT ADLT (ELECTROSURGICAL) ×4 IMPLANT
GAUZE PACKING FOLDED 2  STR (GAUZE/BANDAGES/DRESSINGS)
GAUZE PACKING FOLDED 2 STR (GAUZE/BANDAGES/DRESSINGS) ×2 IMPLANT
GAUZE SPONGE 2X2 8PLY STRL LF (GAUZE/BANDAGES/DRESSINGS) ×6 IMPLANT
GAUZE SPONGE 4X4 12PLY STRL (GAUZE/BANDAGES/DRESSINGS) ×2 IMPLANT
GLOVE BIO SURGEON STRL SZ 6.5 (GLOVE) ×2 IMPLANT
GLOVE BIO SURGEON STRL SZ8 (GLOVE) ×2 IMPLANT
GLOVE BIO SURGEON STRL SZ8.5 (GLOVE) ×2 IMPLANT
GLOVE EXAM NITRILE XL STR (GLOVE) IMPLANT
GOWN STRL REUS W/ TWL LRG LVL3 (GOWN DISPOSABLE) ×9 IMPLANT
GOWN STRL REUS W/ TWL XL LVL3 (GOWN DISPOSABLE) IMPLANT
GOWN STRL REUS W/TWL LRG LVL3 (GOWN DISPOSABLE) ×10
GOWN STRL REUS W/TWL XL LVL3 (GOWN DISPOSABLE) ×2
HEMOSTAT ARISTA ABSORB 3G PWDR (HEMOSTASIS) IMPLANT
HEMOSTAT POWDER KIT SURGIFOAM (HEMOSTASIS) ×2 IMPLANT
IV NS 1000ML (IV SOLUTION) ×4
IV NS 1000ML BAXH (IV SOLUTION) ×4 IMPLANT
KIT BASIN OR (CUSTOM PROCEDURE TRAY) ×4 IMPLANT
KIT TURNOVER KIT B (KITS) ×4 IMPLANT
MARKER SKIN DUAL TIP RULER LAB (MISCELLANEOUS) ×2 IMPLANT
NDL HYPO 25GX1X1/2 BEV (NEEDLE) ×4 IMPLANT
NDL HYPO 25X1 1.5 SAFETY (NEEDLE) ×2 IMPLANT
NDL SPNL 22GX3.5 QUINCKE BK (NEEDLE) IMPLANT
NDL SPNL 25GX3.5 QUINCKE BL (NEEDLE) ×2 IMPLANT
NEEDLE HYPO 25GX1X1/2 BEV (NEEDLE) ×4 IMPLANT
NEEDLE HYPO 25X1 1.5 SAFETY (NEEDLE) ×2 IMPLANT
NEEDLE SPNL 22GX3.5 QUINCKE BK (NEEDLE) ×2 IMPLANT
NEEDLE SPNL 25GX3.5 QUINCKE BL (NEEDLE) ×2 IMPLANT
NS IRRIG 1000ML POUR BTL (IV SOLUTION) ×4 IMPLANT
PACK LAMINECTOMY NEURO (CUSTOM PROCEDURE TRAY) IMPLANT
PAD ARMBOARD 7.5X6 YLW CONV (MISCELLANEOUS) ×6 IMPLANT
PATTIES SURGICAL .25X.25 (GAUZE/BANDAGES/DRESSINGS) ×2 IMPLANT
PATTIES SURGICAL .5 X.5 (GAUZE/BANDAGES/DRESSINGS) ×2 IMPLANT
PATTIES SURGICAL .5 X3 (DISPOSABLE) ×4 IMPLANT
PENCIL BUTTON HOLSTER BLD 10FT (ELECTRODE) ×2 IMPLANT
PIN MAYFIELD SKULL DISP (PIN) ×2 IMPLANT
SEALANT ADHERUS EXTEND TIP (MISCELLANEOUS) IMPLANT
SHEATH ENDOSCRUB 0 DEG (SHEATH) ×2 IMPLANT
SHEATH ENDOSCRUB 30 DEG (SHEATH) IMPLANT
SHEATH ENDOSCRUB 45 DEG (SHEATH) IMPLANT
SOL ANTI FOG 6CC (MISCELLANEOUS) IMPLANT
SPECIMEN JAR SMALL (MISCELLANEOUS) IMPLANT
SPIKE FLUID TRANSFER (MISCELLANEOUS) ×2 IMPLANT
SPLINT NASAL DOYLE BI-VL (GAUZE/BANDAGES/DRESSINGS) ×2 IMPLANT
SPLINT NASAL POSISEP X .6X2 (GAUZE/BANDAGES/DRESSINGS) IMPLANT
SPLINT NASAL POSISEP X2 .8X2.3 (GAUZE/BANDAGES/DRESSINGS) IMPLANT
SPONGE SURGIFOAM ABS GEL SZ50 (HEMOSTASIS) IMPLANT
SPONGE T-LAP 4X18 ~~LOC~~+RFID (SPONGE) ×2 IMPLANT
STAPLER SKIN PROX WIDE 3.9 (STAPLE) ×2 IMPLANT
STRIP CLOSURE SKIN 1/2X4 (GAUZE/BANDAGES/DRESSINGS) IMPLANT
SUCTION FRAZIER HANDLE 10FR (MISCELLANEOUS) ×4
SUCTION TUBE FRAZIER 10FR DISP (MISCELLANEOUS) ×2 IMPLANT
SUT 5.0 PDS RB-1 (SUTURE)
SUT BONE WAX W31G (SUTURE) ×2 IMPLANT
SUT CHROMIC 4 0 P 3 18 (SUTURE) ×2 IMPLANT
SUT ETHILON 3 0 FSL (SUTURE) IMPLANT
SUT ETHILON 3 0 PS 1 (SUTURE) IMPLANT
SUT ETHILON 6 0 P 1 (SUTURE) IMPLANT
SUT PDS AB 4-0 RB1 27 (SUTURE) IMPLANT
SUT PDS PLUS AB 5-0 RB-1 (SUTURE) IMPLANT
SUT PLAIN 4 0 ~~LOC~~ 1 (SUTURE) ×2 IMPLANT
SUT SILK 2 0 SH (SUTURE) ×3 IMPLANT
SUT VIC AB 2-0 CT1 27 (SUTURE)
SUT VIC AB 2-0 CT1 27XBRD (SUTURE) IMPLANT
SUT VIC AB 3-0 SH 8-18 (SUTURE) IMPLANT
SUT VIC AB 4-0 P-3 18X BRD (SUTURE) IMPLANT
SUT VIC AB 4-0 P3 18 (SUTURE)
SYR 20CC LL (SYRINGE) IMPLANT
SYR 5ML LL (SYRINGE) IMPLANT
SYR CONTROL 10ML LL (SYRINGE) ×2 IMPLANT
SYR TB 1ML LUER SLIP (SYRINGE) ×4 IMPLANT
TOWEL GREEN STERILE (TOWEL DISPOSABLE) ×2 IMPLANT
TOWEL GREEN STERILE FF (TOWEL DISPOSABLE) ×4 IMPLANT
TRACKER ENT INSTRUMENT (MISCELLANEOUS) ×4 IMPLANT
TRACKER ENT PATIENT (MISCELLANEOUS) ×2 IMPLANT
TRAP SPECIMEN MUCUS 40CC (MISCELLANEOUS) IMPLANT
TRAY ENT MC OR (CUSTOM PROCEDURE TRAY) ×4 IMPLANT
TRAY FOLEY MTR SLVR 16FR STAT (SET/KITS/TRAYS/PACK) ×2 IMPLANT
TUBE CONNECTING 12X1/4 (SUCTIONS) ×2 IMPLANT
TUBE SALEM SUMP 16 FR W/ARV (TUBING) ×2 IMPLANT
TUBING EXTENTION W/L.L. (IV SETS) ×2 IMPLANT
TUBING FEATHERFLOW (TUBING) IMPLANT
TUBING STRAIGHTSHOT EPS 5PK (TUBING) ×3 IMPLANT
UNDERPAD 30X36 HEAVY ABSORB (UNDERPADS AND DIAPERS) ×2 IMPLANT
WATER STERILE IRR 1000ML POUR (IV SOLUTION) ×4 IMPLANT

## 2022-10-20 NOTE — Anesthesia Procedure Notes (Signed)
Arterial Line Insertion Start/End2/15/2024 8:00 AM, 10/20/2022 8:06 AM Performed by: Effie Berkshire, MD, Griffin Dakin, CRNA, CRNA  Patient location: OR. Preanesthetic checklist: patient identified, IV checked, site marked, risks and benefits discussed, surgical consent, monitors and equipment checked, pre-op evaluation and timeout performed Right, radial was placed Catheter size: 20 G Hand hygiene performed  and maximum sterile barriers used  Allen's test indicative of satisfactory collateral circulation Attempts: 1 Procedure performed without using ultrasound guided technique. Following insertion, dressing applied and Biopatch. Post procedure assessment: normal  Patient tolerated the procedure well with no immediate complications.

## 2022-10-20 NOTE — Transfer of Care (Signed)
Immediate Anesthesia Transfer of Care Note  Patient: [name]  Procedure(s) Performed: TRANSSPHENOIDAL RESECTION OF PITUITARY TUMOR TRANSPHENOIDAL APPROACH EXPOSURE (Bilateral) NASAL SEPTOPLASTY (Bilateral)  Patient Location: PACU  Anesthesia Type:General  Level of Consciousness: drowsy  Airway & Oxygen Therapy: Patient Spontanous Breathing and Patient connected to face mask oxygen  Post-op Assessment: Report given to RN and Post -op Vital signs reviewed and stable  Post vital signs: Reviewed and stable  Last Vitals:  Vitals Value Taken Time  BP 110/49 10/20/22 1045  Temp    Pulse 54 10/20/22 1048  Resp 10 10/20/22 1048  SpO2 100 % 10/20/22 1048  Vitals shown include unvalidated device data.  Last Pain:  Vitals:   10/20/22 0648  TempSrc:   PainSc: 0-No pain         Complications: No notable events documented.

## 2022-10-20 NOTE — Op Note (Signed)
OPERATIVE NOTE  [name] Date/Time of Admission: 10/20/2022  5:25 AM  CSN: [id number] Attending Provider: Newman Pies, MD Room/Bed: MCPO/NONE DOB: 12/27/1945 Age: 77 y.o.   Pre-Op Diagnosis: PITUITARY MACROADENOMA WITH EXTRASELLAR EXTENSION  Post-Op Diagnosis: PITUITARY MACROADENOMA WITH EXTRASELLAR EXTENSION  Procedure: Procedure(s): TRANSSPHENOIDAL RESECTION OF PITUITARY TUMOR  Anesthesia: General  Surgeon(s): Areana Kosanke A Blain Hunsucker, DO Newman Pies, MD  Staff: Circulator: Zannie Kehr, RN; Leia Alf, RN; Chase Caller, RN Scrub Person: [place], RN; Dollene Cleveland T Vendor Representative : Drema Balzarine  Implants: * No implants in log *  Specimens: ID Type Source Tests Collected by Time Destination  1 : Pituitary Tumor Tissue PATH Other SURGICAL PATHOLOGY Newman Pies, MD [id number]     Complications: None  EBL: 25 ML  Condition: stable  Operative Findings:  Left septal deviation with bony spurring inferiorly  Description of Operation: Once operative consent was obtained and the site and surgery were confirmed with the patient and the operating room team, the patient was brought back to the operating room and general endotracheal anesthesia was obtained. The patient was then turned over to the ENT service, at which time the image-guided system was attached and noted to be in good calibration. Lidocaine 1% with 1:100,000 epinephrine was injected into the nasal septum bilaterally, inferior turbinates bilaterally, the middle turbinates bilaterally, and the axilla between the medial turbinate and the lateral nasal wall. Adrenaline-soaked pledgets were placed into the nasal cavity, and the patient was prepped and draped in sterile fashion. Attention was first turned to the right nasal vestibule.  The inferior and middle turbinate were gently lateralized using a Cottle elevator until the superior turbinate was identified.   This was also lateralized until the natural sinus ostium was appreciated.  This was serially dilated using a mushroom punch and an up-biting Kerrison.  A posterior septectomy was then performed using a combination of a Cottle elevator, Tru-Cut forceps and Kerrisons. The mucosa for a right-sided nasal septal flap was elevated using the cottle elevator and carefully preserved.  The left middle turbinate and superior turbinate were then identified and gently lateralized until the natural sphenoid os was identified and serially dilated using the sphenoid dilator, mushroom punch and up-biting Kerrisons.   The mucosa for a left-sided nasal septal flap was elevated using the cottle elevator and carefully preserved. With the bilateral sphenoid cavities in view, the remaining bone of the sphenoid face was removed using Kerrison forceps.  A diamond bur drill was also used to complete the bony resection and to remove the intrasinus septum until the bone overlying the pituitary adenoma was completely visualized.  This bone was removed using Kerrison forceps until the tumor was completely visualized. Dr. Arnoldo Morale then scrubbed in and resected the tumor, please see his operative note for details regarding the repair.  There was no evidence of CSF leak following tumor removal. Gelfoam was placed into the surgical defect and was reinforced with Surgicel. Adheris was used to completely cover the flap and Surgicel.  Posisep nasal packing was placed in bilateral nasal cavities in the sphenoid defect. An orogastric tube was placed and the stomach cavity was suctioned to reduce postoperative nausea. The patient was turned over to anesthesia service and was extubated in the operating room and transferred to the PACU in stable condition.   [name], [facility]  10/20/2022

## 2022-10-20 NOTE — H&P (Signed)
Subjective: The patient is a 77 year old white male with a history of chronic headaches.  He has been worked up the patient is a 77 year old who has complained of headaches.  He was worked up with a brain MRI which demonstrated pituitary macroadenoma.  He has decided proceed with surgery.   Past Medical History:  Diagnosis Date   Anxiety and depression    Chronic renal insufficiency, stage 3 (moderate) (HCC)    DDD (degenerative disc disease), lumbar    remote hx of fusion surgery   Dysrhythmia    isolated afib/flutter   GERD (gastroesophageal reflux disease)    Gout    History of stomach ulcers 2000   Hyperlipidemia    Hypertension    NAUSEA WITH VOMITING 10/15/2010   Rheumatoid arthritis ([place])    Rheumatoid     Past Surgical History:  Procedure Laterality Date   APPENDECTOMY  1968   back fusion  2000   CHOLECYSTECTOMY N/A 06/06/2017   Procedure: LAPAROSCOPIC CHOLECYSTECTOMY WITH INTRAOPERATIVE CHOLANGIOGRAM;  Surgeon: [name], MD;  Location: [facility];  Service: General;  Laterality: N/A;   EYE SURGERY     Dr. [name]  lens implants   [place]   with titanium rods   UMBILICAL HERNIA REPAIR N/A 06/06/2017   Procedure: UMBILICAL HERNIA REPAIR;  Surgeon: [name], MD;  Location: [facility];  Service: General;  Laterality: N/A;    No Known Allergies  Social History   Tobacco Use   Smoking status: Former    Packs/day: 2.00    Years: 20.00    Total pack years: 40.00    Types: Cigarettes    Quit date: 1980    Years since quitting: 44.1   Smokeless tobacco: Never   Tobacco comments:    Stopped early 9s  Substance Use Topics   Alcohol use: No    Family History  Problem Relation Age of Onset   Cancer Mother    CVA Mother    Cancer Maternal Grandmother    Healthy Son    Colon cancer Neg Hx    Prior to Admission medications   Medication Sig Start Date End Date Taking? Authorizing Provider  acetaminophen  (TYLENOL) 500 MG tablet Take 1 tablet (500 mg total) by mouth every 6 (six) hours as needed for mild pain or headache. 12/28/21  Yes Sheikh, Omair Latif, DO  allopurinol (ZYLOPRIM) 300 MG tablet Take 300 mg by mouth daily.   Yes [provider]  amLODipine (NORVASC) 5 MG tablet Take 5 mg by mouth daily. 08/22/22  Yes [provider]  aspirin EC 81 MG tablet Take 1 tablet (81 mg total) by mouth daily. Swallow whole. 09/02/22  Yes Lovey Newcomer C, NP  escitalopram (LEXAPRO) 20 MG tablet Take 20 mg by mouth at bedtime.    Yes [provider]  folic acid (FOLVITE) 1 MG tablet Take 1 mg by mouth daily.   Yes [provider]  losartan-hydrochlorothiazide (HYZAAR) 100-25 MG tablet Take 1 tablet by mouth daily. 08/22/22  Yes [provider]  pantoprazole (PROTONIX) 40 MG tablet Take 40 mg by mouth at bedtime.   Yes [provider]  pravastatin (PRAVACHOL) 40 MG tablet Take 40 mg by mouth daily. 04/15/21  Yes [provider]  predniSONE (DELTASONE) 5 MG tablet Take 5 mg by mouth daily with breakfast.   Yes [provider]  propranolol ER (INDERAL LA) 60 MG 24 hr capsule Take 1 capsule (  60 mg total) by mouth daily. Please call and schedule appointment for March 2024 or sooner if symptoms change. 06/16/22  Yes McCue, Janett Billow, NP  traMADol-acetaminophen (ULTRACET) 37.5-325 MG tablet Take 1 tablet by mouth daily.   Yes [provider]  traZODone (DESYREL) 50 MG tablet Take 50 mg by mouth at bedtime. 02/13/21  Yes [provider]  loperamide (IMODIUM) 2 MG capsule Take 1 capsule (2 mg total) by mouth every 6 (six) hours as needed for diarrhea or loose stools. Patient not taking: Reported on 10/07/2022 01/14/22   Kathie Dike, MD  testosterone cypionate (DEPOTESTOSTERONE CYPIONATE) 200 MG/ML injection Inject 200 mg into the muscle every 14 (fourteen) days. Patient not taking: Reported on 10/07/2022 07/30/22   [provider]     Review of Systems  Positive ROS: as above  All other systems have been reviewed and were otherwise negative with the exception of those mentioned in the HPI and as above.  Objective: Vital signs in last 24 hours: Temp:  [98.4 F (36.9 C)] 98.4 F (36.9 C) (02/15 0551) Pulse Rate:  [59] 59 (02/15 0551) Resp:  [18] 18 (02/15 0551) BP: (138)/(77) 138/77 (02/15 0551) SpO2:  [97 %] 97 % (02/15 0551) Weight:  [70.3 kg] 70.3 kg (02/15 0551) Estimated body mass index is 23.57 kg/m as calculated from the following:   Height as of this encounter: 5' 8"$  (1.727 m).   Weight as of this encounter: 70.3 kg.   General Appearance: Alert Head: Normocephalic, without obvious abnormality, atraumatic Eyes: PERRL, conjunctiva/corneas clear, EOM's intact,    Ears: Normal  Throat: Normal  Neck: Supple, Back: unremarkable Lungs: Clear to auscultation bilaterally, respirations unlabored Heart: Regular rate and rhythm, no murmur, rub or gallop Abdomen: Soft, non-tender Extremities: Extremities normal, atraumatic, no cyanosis or edema Skin: unremarkable  NEUROLOGIC:   Mental status: alert and oriented,Motor Exam - grossly normal Sensory Exam - grossly normal Reflexes:  Coordination - grossly normal Gait - grossly normal Balance - grossly normal Cranial Nerves: I: smell Not tested  II: visual acuity  OS: Normal  OD: Normal   II: visual fields Full to confrontation  II: pupils Equal, round, reactive to light  III,VII: ptosis None  III,IV,VI: extraocular muscles  Full ROM  V: mastication Normal  V: facial light touch sensation  Normal  V,VII: corneal reflex  Present  VII: facial muscle function - upper  Normal  VII: facial muscle function - lower Normal  VIII: hearing Not tested  IX: soft palate elevation  Normal  IX,X: gag reflex Present  XI: trapezius strength  5/5  XI: sternocleidomastoid strength 5/5  XI: neck flexion strength  5/5  XII: tongue strength  Normal    Data  Review Lab Results  Component Value Date   WBC 9.7 10/12/2022   HGB 12.8 (L) 10/12/2022   HCT 38.9 (L) 10/12/2022   MCV 91.5 10/12/2022   PLT 283 10/12/2022   Lab Results  Component Value Date   NA 137 10/12/2022   K 4.1 10/12/2022   CL 99 10/12/2022   CO2 28 10/12/2022   BUN 47 (H) 10/12/2022   CREATININE 1.87 (H) 10/12/2022   GLUCOSE 117 (H) 10/12/2022   Lab Results  Component Value Date   INR 1.2 08/26/2022    Assessment/Plan: Pituitary macroadenoma, headaches: I have discussed the situation with the patient.  We discussed the various treatment options including surgery.  I have described the surgical treatment option of the pain and swelling over resection of  the tumor and harvesting of abdominal fat graft.  We have discussed the risk, benefits, alternatives, expected postoperative course, and likelihood of achieving our goals with surgery.  I have answered all the patient's, and his wife, questions.  He has decided proceed with surgery.  Dr. Donne Anon is going to do the exposure.   Ophelia Charter 10/20/2022 7:26 AM

## 2022-10-20 NOTE — Anesthesia Postprocedure Evaluation (Signed)
Anesthesia Post Note  Patient: ORIEL KLAUS  Procedure(s) Performed: TRANSSPHENOIDAL RESECTION OF PITUITARY TUMOR TRANSPHENOIDAL APPROACH EXPOSURE (Bilateral) NASAL SEPTOPLASTY (Bilateral)     Patient location during evaluation: PACU Anesthesia Type: General Level of consciousness: awake and alert Pain management: pain level controlled Vital Signs Assessment: post-procedure vital signs reviewed and stable Respiratory status: spontaneous breathing, nonlabored ventilation, respiratory function stable and patient connected to nasal cannula oxygen Cardiovascular status: blood pressure returned to baseline and stable Postop Assessment: no apparent nausea or vomiting Anesthetic complications: no  No notable events documented.  Last Vitals:  Vitals:   10/20/22 1500 10/20/22 1600  BP: (!) 148/76 (!) 167/79  Pulse: (!) 59 63  Resp: 12 15  Temp:  (!) 35.6 C  SpO2: 97% 98%    Last Pain:  Vitals:   10/20/22 1600  TempSrc: Axillary  PainSc: 10-Worst pain ever                 Effie Berkshire

## 2022-10-20 NOTE — Anesthesia Procedure Notes (Signed)
Procedure Name: Intubation Date/Time: 10/20/2022 7:55 AM  Performed by: Griffin Dakin, CRNAPre-anesthesia Checklist: Patient identified, Emergency Drugs available, Suction available and Patient being monitored Patient Re-evaluated:Patient Re-evaluated prior to induction Oxygen Delivery Method: Circle system utilized Preoxygenation: Pre-oxygenation with 100% oxygen Induction Type: IV induction Ventilation: Mask ventilation without difficulty and Oral airway inserted - appropriate to patient size Laryngoscope Size: Mac and 4 Grade View: Grade I Tube type: Oral Tube size: 7.5 mm Number of attempts: 1 Airway Equipment and Method: Stylet and Oral airway Placement Confirmation: ETT inserted through vocal cords under direct vision, positive ETCO2 and breath sounds checked- equal and bilateral Secured at: 22 cm Tube secured with: Tape Dental Injury: Teeth and Oropharynx as per pre-operative assessment

## 2022-10-20 NOTE — Op Note (Signed)
Brief history: The patient is a 77 year old white male who has complained of headaches.  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  I discussed the various treatment options.  He has decided to proceed with surgery.  Preop diagnosis: Pituitary macroadenoma  Postop diagnosis: The same  Procedure: Endoscopic transsphenoidal resection of pituitary tumor with navigation  Surgeon: Dr. Earle Gell and Dr. Bunnie Philips  Specimens: Tumor  Drains: None  Complications: None  Description of procedure: The patient was brought to the operating room by the anesthesia team.  General endotracheal anesthesia was induced.  The exposure was done by Dr. Bunnie Philips.  Please refer to her note.  After Dr. Bunnie Philips divided endoscopic exposure to the sphenoid sinus/floor of the sella, I used a high-speed drill to drill away thin amount of bone from the floor of the sella.  I then incised the dura with the 15 blade scalpel.  I then used the curettes to debulk the patient's tumor.  We obtained specimens for the pathologist.  We appeared to get a good resection based on visualization as well as the navigation.  After I was satisfied with the tumor resection I obtained hemostasis using bipolar cautery and Gelfoam.  I placed Gelfoam over the durotomy.  At this point Dr. Bunnie Philips performed the closure.

## 2022-10-20 NOTE — Discharge Instructions (Signed)
[facility] ([facility]) Post Operative Instructions  Office: [phone]  The Surgery Itself Endoscopic sinus surgery (with or without septoplasty and turbinate reduction) involves general anesthesia, typically for one to two hours. Patients may be sedated for several hours after surgery and may remain sleepy for the better part of the day. Nausea and vomiting are occasionally seen, and usually resolve by the evening of surgery - even without additional medications. Almost all patients can go home the day of surgery.  After Surgery  Facial pressure and fullness similar to a sinus infection/headache is normal after surgery. Breathing through your nose is also difficult due to swelling. A humidifier or vaporizer can be used in the bedroom to prevent throat pain with mouth breathing.   Bloody nasal drainage is normal after this surgery for 5-7 days, usually decreasing in volume with each day that passes. Drainage will flow from the front of the nose and down the back of the throat. Make sure you spit out blood drainage that drips down the back of your throat to prevent nausea/vomiting. You will have a nasal drip pad/sling with gauze to catch drainage from the front of your nose. The dressing may need to be changed frequently during the first 24 hours following surgery. In case of profuse nasal bleeding, you may apply ice to the bridge of the nose and pinch the nose just above the tip and hold for 10 minutes; if bleeding continues, contact the doctors office.   Frequent hot showers or saline nasal rinses (NeilMed) will help break up congestion and clear any clot or mucus that builds up within the nose after surgery. This can be started the day after surgery.   It is more comfortable to sleep with extra pillows or in a recliner for the first few days after surgery until the drainage begins to resolve.    Do not blow your nose for 2 weeks after surgery.   Avoid lifting > 10 lbs. and no  vigorous exercise for 2 weeks after Surgery.   Avoid airplane travel for 2 weeks following sinus surgery; the cabin pressure changes can cause pain and swelling within the nose/sinuses.   Sense of smell and taste are often diminished for several weeks after surgery. There may be some tenderness or numbness in your upper front teeth, which is normal after surgery. You may express old clot, discolored mucus or very large nasal crusts from your nose for up to 3-4 weeks after surgery; depending on how frequently and how effectively you irrigate your nose with the saltwater spray.   You may have absorbable sutures inside of your nose after surgery that will slowly dissolve in 2-3 weeks. Be careful when clearing crusts from the nose since they may be attached to these sutures.  Medications  Pain medication can be used for pain as prescribed. Pain and pressure in the nose is expected after surgery. As the surgical site heals, pain will resolve over the course of a week. Pain medications can cause nausea, which can be prevented if you take them with food or milk.   You may be given an antibiotic for one week after surgery to prevent infection. Take this medication with food to prevent nausea or vomiting.   You can use 2 nasal sprays after surgery: Afrin can be used up to 2 times a day for up to 5 days after surgery (best before bed) to reduce bloody drainage from the nose for the first few days after surgery. Saline/salt  water spray can be used as often as you would like starting the day after surgery to prevent crusting inside of the nose. It is recommended to use at least 4-6 times a day.   Take all of your routine medications as prescribed, unless told otherwise by your surgeon. Any medications that thin the blood should be avoided. This includes aspirin. Avoid aspirin-like products for the first 72 hours after surgery (Advil, Motrin, Excedrin, Alieve, Celebrex, Naprosyn), but you may use them as needed  for pain after 72 hours.

## 2022-10-21 ENCOUNTER — Encounter (HOSPITAL_COMMUNITY): Payer: Self-pay | Admitting: Neurosurgery

## 2022-10-21 LAB — BASIC METABOLIC PANEL
Anion gap: 8 (ref 5–15)
BUN: 33 mg/dL — ABNORMAL HIGH (ref 8–23)
CO2: 27 mmol/L (ref 22–32)
Calcium: 8.3 mg/dL — ABNORMAL LOW (ref 8.9–10.3)
Chloride: 101 mmol/L (ref 98–111)
Creatinine, Ser: 1.84 mg/dL — ABNORMAL HIGH (ref 0.61–1.24)
GFR, Estimated: 37 mL/min — ABNORMAL LOW (ref >60)
Glucose, Bld: 181 mg/dL — ABNORMAL HIGH (ref 70–99)
Potassium: 4.8 mmol/L (ref 3.5–5.1)
Sodium: 136 mmol/L (ref 135–145)

## 2022-10-21 MED FILL — Thrombin For Soln 5000 Unit: CUTANEOUS | Qty: 2 | Status: AC

## 2022-10-21 NOTE — Progress Notes (Signed)
Subjective: The patient is alert and pleasant.  He looks and feels well.  He has no complaints.  Objective: Vital signs in last 24 hours: Temp:  [96.1 F (35.6 C)-98.1 F (36.7 C)] 98.1 F (36.7 C) (02/16 0400) Pulse Rate:  [53-93] 66 (02/16 0700) Resp:  [8-22] 10 (02/16 0700) BP: (90-167)/(49-87) 126/60 (02/16 0700) SpO2:  [90 %-100 %] 95 % (02/16 0700) Arterial Line BP: (119-163)/(52-118) 156/118 (02/15 1300) Estimated body mass index is 23.57 kg/m as calculated from the following:   Height as of this encounter: 5' 8"$  (1.727 m).   Weight as of this encounter: 70.3 kg.   Intake/Output from previous day: 02/15 0701 - 02/16 0700 In: 2475.9 [I.V.:1925.9; IV Piggyback:550] Out: 2425 [Urine:2175; Blood:250] Intake/Output this shift: No intake/output data recorded.  Physical exam the patient is alert and oriented.  He is moving all 4 extremities well.  His vision is grossly normal.  Lab Results: Recent Labs    10/20/22 0932  HGB 9.5*  HCT 28.0*   BMET Recent Labs    10/20/22 0932 10/21/22 0444  NA 136 136  K 4.7 4.8  CL  --  101  CO2  --  27  GLUCOSE  --  181*  BUN  --  33*  CREATININE  --  1.84*  CALCIUM  --  8.3*    Studies/Results: No results found.  Assessment/Plan: Postop day #1: The patient is doing well.  His BMP looks good.  We will discontinue his Foley catheter and transfer him to the progressive unit.  He will likely go home tomorrow.  I have answered all his questions.  LOS: 1 day     Ophelia Charter 10/21/2022, 7:47 AM     Patient ID: [name], male   DOB: April 04, 1946, 77 y.o.   MRN: [id number]

## 2022-10-21 NOTE — Progress Notes (Incomplete)
Upon assessment, pt had nasal drainage with scant amount of blood. Received in report from dayshift RN of this observation as well.

## 2022-10-22 NOTE — Discharge Summary (Signed)
BP (!) 142/83 (BP Location: Left Arm)   Pulse 75   Temp 98.6 F (37 C) (Oral)   Resp 17   Ht 5' 8"$  (1.727 m)   Wt 70.3 kg   SpO2 96%   BMI 23.57 kg/m  Physician Discharge Summary  Patient ID: [name] MRN: [id number] DOB/AGE: 1945-09-29 77 y.o.  Admit date: 10/20/2022 Discharge date: 10/22/2022  Admission Diagnoses:  Discharge Diagnoses:  Principal Problem:   Pituitary macroadenoma [facility])   Discharged Condition: good  Hospital Course: [name] was admitted and taken to the operating room for an uncomplicated transsphenoidal pituitary tumor resection. Post op he is ambulating, voiding, and and tolerating a regular diet.   Treatments: surgery: Transsphenoidal pituitary tumor resection  Discharge Exam: Blood pressure (!) 142/83, pulse 75, temperature 98.6 F (37 C), temperature source Oral, resp. rate 17, height 5' 8"$  (1.727 m), weight 70.3 kg, SpO2 96 %. General appearance: alert, appears stated age, and no distress Neurologic: Alert and oriented X 3, normal strength and tone. Normal symmetric reflexes. Normal coordination and gait  Disposition: Discharge disposition: 01-Home or Self Care      PITUITARY MACROADENOMA WITH EXTRASELLAR EXTENSION  Allergies as of 10/22/2022   No Known Allergies      Medication List     STOP taking these medications    loperamide 2 MG capsule Commonly known as: IMODIUM   testosterone cypionate 200 MG/ML injection Commonly known as: DEPOTESTOSTERONE CYPIONATE       TAKE these medications    acetaminophen 500 MG tablet Commonly known as: TYLENOL Take 1 tablet (500 mg total) by mouth every 6 (six) hours as needed for mild pain or headache.   allopurinol 300 MG tablet Commonly known as: ZYLOPRIM Take 300 mg by mouth daily.   amLODipine 5 MG tablet Commonly known as: NORVASC Take 5 mg by mouth daily.   aspirin EC 81 MG tablet Take 1 tablet (81 mg total) by mouth daily. Swallow whole.   escitalopram 20 MG  tablet Commonly known as: LEXAPRO Take 20 mg by mouth at bedtime.   folic acid 1 MG tablet Commonly known as: FOLVITE Take 1 mg by mouth daily.   losartan-hydrochlorothiazide 100-25 MG tablet Commonly known as: HYZAAR Take 1 tablet by mouth daily.   pantoprazole 40 MG tablet Commonly known as: PROTONIX Take 40 mg by mouth at bedtime.   pravastatin 40 MG tablet Commonly known as: PRAVACHOL Take 40 mg by mouth daily.   predniSONE 5 MG tablet Commonly known as: DELTASONE Take 5 mg by mouth daily with breakfast.   propranolol ER 60 MG 24 hr capsule Commonly known as: INDERAL LA Take 1 capsule (60 mg total) by mouth daily. Please call and schedule appointment for March 2024 or sooner if symptoms change.   traMADol-acetaminophen 37.5-325 MG tablet Commonly known as: ULTRACET Take 1 tablet by mouth daily.   traZODone 50 MG tablet Commonly known as: DESYREL Take 50 mg by mouth at bedtime.        Follow-up Information     Skotnicki, Meghan A, DO Follow up on 11/02/2022.   Specialty: Otolaryngology Why: Follow up as scheduled on 11/02/2022 @ 1:15PM Contact information: [address] [place] [address] [phone]         Newman Pies, MD Follow up.   Specialty: Neurosurgery Why: keep your scheduled appointment, if one not given please call the office to schedule your appointment Contact information: [address] [place] [address]  [phone]                 Signed: Ashok Pall 10/22/2022, 12:22 PM

## 2022-10-24 LAB — SURGICAL PATHOLOGY

## 2022-10-27 DIAGNOSIS — M47816 Spondylosis without myelopathy or radiculopathy, lumbar region: Secondary | ICD-10-CM | POA: Diagnosis not present

## 2022-11-10 DIAGNOSIS — M1A09X0 Idiopathic chronic gout, multiple sites, without tophus (tophi): Secondary | ICD-10-CM | POA: Diagnosis not present

## 2022-11-10 DIAGNOSIS — E785 Hyperlipidemia, unspecified: Secondary | ICD-10-CM | POA: Diagnosis not present

## 2022-11-10 DIAGNOSIS — K219 Gastro-esophageal reflux disease without esophagitis: Secondary | ICD-10-CM | POA: Diagnosis not present

## 2022-11-10 DIAGNOSIS — Z79899 Other long term (current) drug therapy: Secondary | ICD-10-CM | POA: Diagnosis not present

## 2022-11-10 DIAGNOSIS — M549 Dorsalgia, unspecified: Secondary | ICD-10-CM | POA: Diagnosis not present

## 2022-11-10 DIAGNOSIS — M0589 Other rheumatoid arthritis with rheumatoid factor of multiple sites: Secondary | ICD-10-CM | POA: Diagnosis not present

## 2022-11-10 DIAGNOSIS — M1990 Unspecified osteoarthritis, unspecified site: Secondary | ICD-10-CM | POA: Diagnosis not present

## 2022-11-10 DIAGNOSIS — N289 Disorder of kidney and ureter, unspecified: Secondary | ICD-10-CM | POA: Diagnosis not present

## 2022-11-10 DIAGNOSIS — I10 Essential (primary) hypertension: Secondary | ICD-10-CM | POA: Diagnosis not present

## 2022-11-14 DIAGNOSIS — D352 Benign neoplasm of pituitary gland: Secondary | ICD-10-CM | POA: Diagnosis not present

## 2022-11-14 DIAGNOSIS — J3489 Other specified disorders of nose and nasal sinuses: Secondary | ICD-10-CM | POA: Diagnosis not present

## 2022-11-15 ENCOUNTER — Other Ambulatory Visit: Payer: Self-pay | Admitting: Registered Nurse

## 2022-11-15 DIAGNOSIS — D352 Benign neoplasm of pituitary gland: Secondary | ICD-10-CM

## 2022-11-21 DIAGNOSIS — M47816 Spondylosis without myelopathy or radiculopathy, lumbar region: Secondary | ICD-10-CM | POA: Diagnosis not present

## 2022-11-23 DIAGNOSIS — R42 Dizziness and giddiness: Secondary | ICD-10-CM | POA: Diagnosis not present

## 2022-11-23 DIAGNOSIS — K30 Functional dyspepsia: Secondary | ICD-10-CM | POA: Diagnosis not present

## 2022-11-23 DIAGNOSIS — I951 Orthostatic hypotension: Secondary | ICD-10-CM | POA: Diagnosis not present

## 2022-11-23 DIAGNOSIS — R7303 Prediabetes: Secondary | ICD-10-CM | POA: Diagnosis not present

## 2022-11-23 DIAGNOSIS — E7800 Pure hypercholesterolemia, unspecified: Secondary | ICD-10-CM | POA: Diagnosis not present

## 2022-11-23 DIAGNOSIS — R5383 Other fatigue: Secondary | ICD-10-CM | POA: Diagnosis not present

## 2022-11-23 DIAGNOSIS — I10 Essential (primary) hypertension: Secondary | ICD-10-CM | POA: Diagnosis not present

## 2022-12-01 DIAGNOSIS — M47817 Spondylosis without myelopathy or radiculopathy, lumbosacral region: Secondary | ICD-10-CM | POA: Diagnosis not present

## 2022-12-01 DIAGNOSIS — M47816 Spondylosis without myelopathy or radiculopathy, lumbar region: Secondary | ICD-10-CM | POA: Diagnosis not present

## 2022-12-08 DIAGNOSIS — R42 Dizziness and giddiness: Secondary | ICD-10-CM | POA: Diagnosis not present

## 2022-12-08 DIAGNOSIS — M0589 Other rheumatoid arthritis with rheumatoid factor of multiple sites: Secondary | ICD-10-CM | POA: Diagnosis not present

## 2022-12-08 DIAGNOSIS — R5383 Other fatigue: Secondary | ICD-10-CM | POA: Diagnosis not present

## 2022-12-08 DIAGNOSIS — M1A0790 Idiopathic chronic gout, unspecified ankle and foot, without tophus (tophi): Secondary | ICD-10-CM | POA: Diagnosis not present

## 2022-12-08 DIAGNOSIS — R7303 Prediabetes: Secondary | ICD-10-CM | POA: Diagnosis not present

## 2022-12-08 DIAGNOSIS — N184 Chronic kidney disease, stage 4 (severe): Secondary | ICD-10-CM | POA: Diagnosis not present

## 2022-12-08 DIAGNOSIS — G894 Chronic pain syndrome: Secondary | ICD-10-CM | POA: Diagnosis not present

## 2022-12-08 DIAGNOSIS — K30 Functional dyspepsia: Secondary | ICD-10-CM | POA: Diagnosis not present

## 2022-12-08 DIAGNOSIS — E7800 Pure hypercholesterolemia, unspecified: Secondary | ICD-10-CM | POA: Diagnosis not present

## 2022-12-08 DIAGNOSIS — I10 Essential (primary) hypertension: Secondary | ICD-10-CM | POA: Diagnosis not present

## 2022-12-19 ENCOUNTER — Ambulatory Visit: Payer: Medicare Other | Admitting: Adult Health

## 2023-02-03 DIAGNOSIS — M47816 Spondylosis without myelopathy or radiculopathy, lumbar region: Secondary | ICD-10-CM | POA: Diagnosis not present

## 2023-02-07 ENCOUNTER — Ambulatory Visit
Admission: RE | Admit: 2023-02-07 | Discharge: 2023-02-07 | Disposition: A | Discharge status: Home / Self Care | Payer: Medicare Other | Source: Ambulatory Visit | Attending: Registered Nurse | Admitting: Registered Nurse

## 2023-02-07 DIAGNOSIS — D352 Benign neoplasm of pituitary gland: Secondary | ICD-10-CM

## 2023-02-07 MED ORDER — GADOPICLENOL 0.5 MMOL/ML IV SOLN
10.0000 mL | Freq: Once | INTRAVENOUS | Status: AC | PRN
  Administered 2023-02-07: 10 mL via INTRAVENOUS

## 2023-02-17 DIAGNOSIS — D352 Benign neoplasm of pituitary gland: Secondary | ICD-10-CM | POA: Diagnosis not present

## 2023-03-08 DIAGNOSIS — R739 Hyperglycemia, unspecified: Secondary | ICD-10-CM | POA: Diagnosis not present

## 2023-03-08 DIAGNOSIS — I10 Essential (primary) hypertension: Secondary | ICD-10-CM | POA: Diagnosis not present

## 2023-03-08 DIAGNOSIS — E7800 Pure hypercholesterolemia, unspecified: Secondary | ICD-10-CM | POA: Diagnosis not present

## 2023-03-08 DIAGNOSIS — R5383 Other fatigue: Secondary | ICD-10-CM | POA: Diagnosis not present

## 2023-03-20 DIAGNOSIS — R7303 Prediabetes: Secondary | ICD-10-CM | POA: Diagnosis not present

## 2023-03-20 DIAGNOSIS — G894 Chronic pain syndrome: Secondary | ICD-10-CM | POA: Diagnosis not present

## 2023-03-20 DIAGNOSIS — M1A0790 Idiopathic chronic gout, unspecified ankle and foot, without tophus (tophi): Secondary | ICD-10-CM | POA: Diagnosis not present

## 2023-03-20 DIAGNOSIS — N184 Chronic kidney disease, stage 4 (severe): Secondary | ICD-10-CM | POA: Diagnosis not present

## 2023-03-20 DIAGNOSIS — E7800 Pure hypercholesterolemia, unspecified: Secondary | ICD-10-CM | POA: Diagnosis not present

## 2023-03-20 DIAGNOSIS — G4700 Insomnia, unspecified: Secondary | ICD-10-CM | POA: Diagnosis not present

## 2023-03-20 DIAGNOSIS — I10 Essential (primary) hypertension: Secondary | ICD-10-CM | POA: Diagnosis not present

## 2023-03-20 DIAGNOSIS — R7989 Other specified abnormal findings of blood chemistry: Secondary | ICD-10-CM | POA: Diagnosis not present

## 2023-03-20 DIAGNOSIS — M0589 Other rheumatoid arthritis with rheumatoid factor of multiple sites: Secondary | ICD-10-CM | POA: Diagnosis not present

## 2023-03-21 DIAGNOSIS — E785 Hyperlipidemia, unspecified: Secondary | ICD-10-CM | POA: Diagnosis not present

## 2023-03-21 DIAGNOSIS — M549 Dorsalgia, unspecified: Secondary | ICD-10-CM | POA: Diagnosis not present

## 2023-03-21 DIAGNOSIS — M25511 Pain in right shoulder: Secondary | ICD-10-CM | POA: Diagnosis not present

## 2023-03-21 DIAGNOSIS — Z79899 Other long term (current) drug therapy: Secondary | ICD-10-CM | POA: Diagnosis not present

## 2023-03-21 DIAGNOSIS — N289 Disorder of kidney and ureter, unspecified: Secondary | ICD-10-CM | POA: Diagnosis not present

## 2023-03-21 DIAGNOSIS — M25512 Pain in left shoulder: Secondary | ICD-10-CM | POA: Diagnosis not present

## 2023-03-21 DIAGNOSIS — K219 Gastro-esophageal reflux disease without esophagitis: Secondary | ICD-10-CM | POA: Diagnosis not present

## 2023-03-21 DIAGNOSIS — M79641 Pain in right hand: Secondary | ICD-10-CM | POA: Diagnosis not present

## 2023-03-21 DIAGNOSIS — M1990 Unspecified osteoarthritis, unspecified site: Secondary | ICD-10-CM | POA: Diagnosis not present

## 2023-03-21 DIAGNOSIS — M1A09X0 Idiopathic chronic gout, multiple sites, without tophus (tophi): Secondary | ICD-10-CM | POA: Diagnosis not present

## 2023-03-21 DIAGNOSIS — M0589 Other rheumatoid arthritis with rheumatoid factor of multiple sites: Secondary | ICD-10-CM | POA: Diagnosis not present

## 2023-03-21 DIAGNOSIS — M79642 Pain in left hand: Secondary | ICD-10-CM | POA: Diagnosis not present

## 2023-03-21 DIAGNOSIS — M25559 Pain in unspecified hip: Secondary | ICD-10-CM | POA: Diagnosis not present

## 2023-03-21 DIAGNOSIS — I10 Essential (primary) hypertension: Secondary | ICD-10-CM | POA: Diagnosis not present

## 2023-05-03 DIAGNOSIS — R7989 Other specified abnormal findings of blood chemistry: Secondary | ICD-10-CM | POA: Diagnosis not present

## 2023-05-10 DIAGNOSIS — E038 Other specified hypothyroidism: Secondary | ICD-10-CM | POA: Diagnosis not present

## 2023-05-10 DIAGNOSIS — G894 Chronic pain syndrome: Secondary | ICD-10-CM | POA: Diagnosis not present

## 2023-05-10 DIAGNOSIS — R42 Dizziness and giddiness: Secondary | ICD-10-CM | POA: Diagnosis not present

## 2023-05-11 DIAGNOSIS — S51019A Laceration without foreign body of unspecified elbow, initial encounter: Secondary | ICD-10-CM | POA: Diagnosis not present

## 2023-05-11 DIAGNOSIS — S0990XA Unspecified injury of head, initial encounter: Secondary | ICD-10-CM | POA: Diagnosis not present

## 2023-05-26 DIAGNOSIS — M549 Dorsalgia, unspecified: Secondary | ICD-10-CM | POA: Diagnosis not present

## 2023-05-26 DIAGNOSIS — N289 Disorder of kidney and ureter, unspecified: Secondary | ICD-10-CM | POA: Diagnosis not present

## 2023-05-26 DIAGNOSIS — Z79899 Other long term (current) drug therapy: Secondary | ICD-10-CM | POA: Diagnosis not present

## 2023-05-26 DIAGNOSIS — Z23 Encounter for immunization: Secondary | ICD-10-CM | POA: Diagnosis not present

## 2023-05-26 DIAGNOSIS — E785 Hyperlipidemia, unspecified: Secondary | ICD-10-CM | POA: Diagnosis not present

## 2023-05-26 DIAGNOSIS — M0589 Other rheumatoid arthritis with rheumatoid factor of multiple sites: Secondary | ICD-10-CM | POA: Diagnosis not present

## 2023-05-26 DIAGNOSIS — M1990 Unspecified osteoarthritis, unspecified site: Secondary | ICD-10-CM | POA: Diagnosis not present

## 2023-05-26 DIAGNOSIS — M1A09X0 Idiopathic chronic gout, multiple sites, without tophus (tophi): Secondary | ICD-10-CM | POA: Diagnosis not present

## 2023-05-26 DIAGNOSIS — K219 Gastro-esophageal reflux disease without esophagitis: Secondary | ICD-10-CM | POA: Diagnosis not present

## 2023-05-26 DIAGNOSIS — I10 Essential (primary) hypertension: Secondary | ICD-10-CM | POA: Diagnosis not present

## 2023-06-08 DIAGNOSIS — R410 Disorientation, unspecified: Secondary | ICD-10-CM | POA: Diagnosis not present

## 2023-06-08 DIAGNOSIS — E038 Other specified hypothyroidism: Secondary | ICD-10-CM | POA: Diagnosis not present

## 2023-06-08 DIAGNOSIS — R0602 Shortness of breath: Secondary | ICD-10-CM | POA: Diagnosis not present

## 2023-06-08 DIAGNOSIS — R42 Dizziness and giddiness: Secondary | ICD-10-CM | POA: Diagnosis not present

## 2023-06-08 DIAGNOSIS — G894 Chronic pain syndrome: Secondary | ICD-10-CM | POA: Diagnosis not present

## 2023-06-08 DIAGNOSIS — R413 Other amnesia: Secondary | ICD-10-CM | POA: Diagnosis not present

## 2023-06-13 ENCOUNTER — Encounter: Payer: Self-pay | Admitting: Neurology

## 2023-06-13 ENCOUNTER — Ambulatory Visit: Payer: Medicare Other | Admitting: Neurology

## 2023-06-13 VITALS — BP 152/74 | HR 58 | Ht 67.0 in | Wt 149.0 lb

## 2023-06-13 DIAGNOSIS — R4189 Other symptoms and signs involving cognitive functions and awareness: Secondary | ICD-10-CM | POA: Diagnosis not present

## 2023-06-13 DIAGNOSIS — R404 Transient alteration of awareness: Secondary | ICD-10-CM | POA: Diagnosis not present

## 2023-06-13 DIAGNOSIS — F0390 Unspecified dementia without behavioral disturbance: Secondary | ICD-10-CM

## 2023-06-13 NOTE — Patient Instructions (Addendum)
EEG in the office - schedule at check out MRI brain wo contrast At next appointment when wife present can discuss formal neurocognitive testing, testing for amyloid protein and other dementia markers, and then considering follow up testing based on results. Need wife present, I am not entirely sure patient with be compliant.  Evaluate for stroke and/or seizures and dementia. Can also consider sleep apnea test and testing for memory problems after this evaluation at follow up can discuss and need to have your wife present. In 3 months will discuss ordering more dementia-specific tests.  Dementia Dementia is a condition that affects the way the brain works. It often affects thinking and memory.  There are many types of dementia, including: Alzheimer's disease. This is the most common type. Vascular dementia. This type may happen due to a stroke. Lewy body dementia. This type may happen to people who have Parkinson's disease. Frontotemporal dementia. This type is caused by damage to nerve cells in certain parts of the brain. Some people may have more than one type. What are the causes? Dementia is caused by damage to cells in the brain. Some causes that can't be reversed include: Having a condition that affects the blood vessels of the brain. This may be diabetes or heart disease. Changes to genes. Some causes that can be reversed or slowed down include: Injury to the brain due to: A growth called a tumor. A blood clot. Too much fluid in the brain. Taking certain medicines. An infection. Problems with your thyroid. Not having enough vitamin B12 in the body. Having a disease that causes your body's defense system, called the immune system, to attack healthy parts of your body. What are the signs or symptoms? Symptoms of dementia start slowly and get worse with time. They may include: Problems remembering events or people. Getting lost easily. Forgetting appointments or to pay  bills. Having trouble taking a bath or putting clothes on. Having trouble planning and making meals. Having trouble speaking. Changes in behavior or mood. How is this diagnosed? Dementia may be diagnosed based on: Your symptoms and medical history. A physical exam. Tests. These may include: Tests to check your thinking and memory to see how your brain is working. Lab tests. You may have tests on your blood or pee (urine). Imaging tests, such as a CT scan, a PET scan, or an MRI. Genetic testing. This may be done if other family members have had dementia. Your health care provider will talk with you and your family, friends, or caregivers about your history and symptoms. How is this treated? Treatment depends on the cause of the dementia and should start as soon as possible. It might include: Taking medicines for symptoms. Taking medicines to help control or slow down the dementia. Treating the cause of your dementia. Your provider can help you find support groups and other members of the health care team who can help with your care. Follow these instructions at home: Medicines Take medicines only as told by your provider. Use a pill organizer or pill reminder to help you keep track of your medicines. Avoid taking medicines for pain or for sleep. These can affect your thinking. Lifestyle Make healthy choices. Be active as told by your provider. Do not smoke, vape, or use products with nicotine or tobacco in them. If you need help quitting, talk with your provider. Do not drink alcohol. When you feel a lot of stress, do something that helps you relax. Your provider can give you tips.  Spend time with other people. Make sure you get good sleep at night. These tips can help: Try not to take naps during the day. Keep your bedroom dark and cool. Do not exercise in the few hours before you go to bed. Do not have foods or drinks with caffeine at night. Eating and drinking Drink enough  fluid to keep your pee pale yellow. Eat a healthy diet. General instructions  Talk with your provider to decide on: What things you need help with. What your safety needs are. Ask your provider if it's safe for you to drive. If told, wear a bracelet that tracks where you are or shows that you're a person with memory loss. Work with your family to make big legal or health decisions. This may include things like advance directives, medical power of attorney, or a living will. Where to find more information Alzheimer's Association: WesternTunes.it [employer] on Aging: BaseRingTones.pl World Health Organization: VisitDestination.com.br Contact a health care provider if: You have any new symptoms. Your symptoms get worse. You have problems with swallowing. Get help right away if: You feel very sad or feel like you may hurt yourself or others. You have thoughts about taking your own life. Your family members are worried about your safety. These symptoms may be an emergency. Take one of these steps right away: Go to your nearest emergency room. Call 911. Call the National Suicide Prevention Lifeline at 907-043-5157 or 988. Text the Crisis Text Line at 780-515-8609. This information is not intended to replace advice given to you by your health care provider. Make sure you discuss any questions you have with your health care provider. Document Revised: 11/07/2022 Document Reviewed: 11/07/2022 Elsevier Patient Education  2024 [employer].

## 2023-06-13 NOTE — Progress Notes (Addendum)
[facility]    Provider:  Dr Lucia Gaskins Requesting Provider: Linus Galas, NP Primary Care Provider:  Linus Galas, NP  (Addendum: Pod4: Would you call and talk to patient and hopefully also talk to his wife if she is on the HIPAA form please with results. I need her to come with him to the next appointment, so want to make sure she knows about it(she did not come last time which was difficult).Patient's wife was complaining of his memory loss, patient was not and he came alone. I need wife there to evaluate him and discuss with them.   Hi Bari, there was nothing new in your brain but it did show brain volume loss on the area of the brain that we worry about Alzheimers(This is NOT a dementia diagnosis, just a finding on MRI we have to further evaluate). It also showed some "moderately severe chronic microvascular ischemic changes" which are cloged blood vessels which can also cause memory loss. We can discuss further at your follow up but I would like you to bring your wife. Thanks, Dr. Lucia Gaskins cc Linus Galas   MRI brain: IMPRESSION: This MRI of the brain without contrast shows the following: Mild to moderate generalized cortical atrophy that is more severe in the medial temporal lobes.  This pattern is often seen with Alzheimer's disease. Extensive T2/FLAIR hyperintense foci in the cerebral hemispheres and to a lesser extent in the basal ganglia and infratentorial white matter in a pattern consistent with moderately severe chronic microvascular ischemic changes.  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 He states the episode happened a month ago. He doesn't remember details. He states his wife told  him he was up in the middle of the night wandering around. He is only aware of one episode that occurred. He states has in randomly in the middle of the day gone to bed and didn't get up until the next day. He doesn't know why he did that. MMSE 24/30 Animals x 11. Discussed it appears his wife is worried about him but she is not here today, patient states she couldn't make it. He says he had episodes of confusion but last was a month ago. Sometimes he goes to bed and stays there all day he sleep all day and all night. He states he did that last month and no symptoms in the last month. He says he gets confused, (he is a very poor historian), he says he remembers these episodes of confusion and denies loss of consciousness "My wife seems to think it is worse than I do". He states he doesn't get the headaches much at all. The surgery to remove the pituitary tumor helped a lot because he was having bad headaches and now much improved. I could only review the notes from pcp on what his wife said, he denies forgetting what he has eaten or when he has slept, he says he just tells his wife he doesn;t remember and she takes it more serious than him, He states he does not eat very much. He denies not knowing where he went. He states he had not ridden his bike in a while and had back pain due to  not being on the motorcycle all day. He categorically denies any of his wife's reports that I can see in the consult. And since wife is not here it is difficult to get a complete history. He gets dizzy when he stands up(He is iothostatic today in the office), his wife keeps telling him to stand up slowly, he says he drinks 3-4 bottles of water a day plus coffee. States he is dizzy every day. He still has severe back pain and takine 50mg  Tramadol x 3 times a day for it. He has a new back doctor and an appointment on Thursday. He denies any stroke-like symptoms. No weakness, no problems swallowing, no falls, not getting lost, he pays the  bills and he states he does not forget he has a phone bill and a light bill only, they get paid. No accidents in the home or with the car. No falls. No vision changes or weakness. He does endorse dizziness.   Patient complains of symptoms per HPI as well as the following symptoms: none . Pertinent negatives and positives per HPI. All others negative   Follow-up Feb 02, 2022: Since patient has been seen, he has been admitted into the hospital, repeat MRI of the brain showed a stable pituitary adenoma, he is also been seen by endocrinology and neurosurgery. neurosurgery. Still pending ophthalmology. He saw endocrinology and the tumor would not be responsive to any medication so if it grows will need to be removal. His headache is better. He is seeing ophthalmology June 12th, Seeing Dr. Cleda Daub eye doctor, they will send me a note. He does not feel his vision is worsening. Hasn;t had a headache since leaving the hospital. Repeat MRI brain was stable. Would repeat in 3-4 months unless having symptoms. So far no new symptoms. We will call to repeat it in 3 . Seeing NSY in August will repeat in prior to neurosurgery 4th. We will repeat MRI early August. Will call the early July to remind him that repeat it and schedule it late July in preparation for neurosurgery appointment. He feels good right now  Patient complains of symptoms per HPI as well as the following symptoms: diverticulitis . Pertinent negatives and positives per HPI. All others negative  Repeat MRI brain/orbits 01/14/2022: MRI ORBITS FINDINGS   Orbits: Very limited by motion. No detected optic nerve edema. Unremarkable appearance of the globes, orbital fat, extraocular muscles, and lacrimal glands.   Visualized sinuses: Mild mucosal thickening in ethmoid sinuses.   Soft tissues: Negative   IMPRESSION: 1. Significantly motion degraded MRI of the brain and orbits. 2. Unchanged appearance of pituitary macro adenoma when compared to the MRI  last month. No optic nerve mass effect or signs of apoplexy. 3. No acute finding in the brain or orbits. 4. Chronic small vessel ischemia.  Personally reviewed images additional 5 minutes (in addition to appointment time otp today)   HPI:  GAVRIL RADICH is a 77 y.o. male here as requested by Linus Galas, NP for follow-up after being seen in the emergency room.  He has a past medical history of hypertension, hyperlipidemia, CKD stage III, anxiety depression and GERD.  He presented recently after being found to be acutely altered, having constant headache for over a month but acutely worse over the several last week, pain behind the eyes but usually worse on the right, sensitivity to light and sound, home pain medication such as Tylenol Benadryl without relief, associated symptoms dizziness nausea and reportedly an episode of vomiting.  No other significant symptoms such as fever shortness of breath cough chest pain or diarrhea.  He went to see his eye doctor who sent him to the emergency room as he was disoriented and unable to write his name, EMS was called.  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  Neurology was consulted and given a migraine cocktail which abated his headache.  Ophthalmology was consulted and he was found to have a newly diagnosed pituitary mass with bitemporal field cut mild disc edema.  His ophthalmology exam showed no emergent or otherwise medical issues or ocular issues to explain his pain.  Ophthalmology recommended formal visual field testing on outpatient basis and establishment with neurosurgery  Patient reports headaches ongoing for the past 2 to 3 months in the setting of pituitary macroadenoma, present in the morning lasting all day worsening around noon, no worsening headaches with  increased intracranial pressure such as bowel movements, changing positions waking up in the middle the night coughing or sneezing, they are located frontally and periorbitally with right worse than left, nausea with vomiting, associated photophobia and phonophobia, transient blurriness and dizziness but no diplopia. Today it is throbbng around the eyes, nausea, vomiting, blurry vision, no drainage from nipples, migraine cocktail stopped it, came back the next day. He takes 2 aspirin and 2 pain pills this morning. Son is here and provides information. Pulsating/pounding/light sensitivity. Blurry vision. Pain is severe. Better when laying down.     Reviewed notes, labs and imaging from outside physicians, which showed: MRI HEAD FINDINGS   Brain: No acute infarction, hemorrhage, hydrocephalus, or extra-axial fluid collection. Approximately 1.3 cm pituitary mass with suprasellar extension. This mass comes in close proximity to the optic chiasm. Moderate patchy T2/FLAIR hyperintensities in the white matter, nonspecific but compatible with chronic microvascular ischemic disease. Cerebral atrophy.   Vascular: Major arterial flow voids are maintained skull base.   Skull and upper cervical spine: Normal marrow signal.   Other: No mastoid effusions.   MRI ORBITS FINDINGS   Orbits: No orbital mass or evidence of inflammation. Normal appearance of the globes, optic nerve-sheath complexes, extraocular muscles, and lacrimal glands. Prominent retro bulbar fat bilaterally.   Visualized sinuses: Mild-to-moderate paranasal sinus mucosal thickening.   Soft tissues: Normal.   IMPRESSION: Due to patient pain, postcontrast imaging was not performed and study was terminated early. Within this limitation:   1. Approximately 1.3 cm pituitary mass with suprasellar extension. This mass comes in close proximity to the optic chiasm. Recommend dedicated pituitary protocol MRI with contrast for  further characterization. 2. Otherwise, no evidence of acute intracranial or orbital abnormality. 3. 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    Review of records, medications tried that can be used in migraine/headache management include: Tylenol, Decadron, Fioricet, Lexapro, losartan, meloxicam magnesium, Zofran, prednisone, Compazine, nortriptyline/amitriptyline contraindicated due to age and risk of altered mental status due to tricyclic antidepressants, compazine, phenergan, trazodone  Review of Systems: Patient complains of symptoms per HPI as well as the following symptoms severe eye pain and headache. Pertinent negatives and positives per HPI. All others negative.   Social History   Socioeconomic History   Marital status: Married    Spouse name: Lelon Mast   Number of children: 1   Years of education: Not on file   Highest education level:  Not on file  Occupational History   Not on file  Tobacco Use   Smoking status: Former    Current packs/day: 0.00    Average packs/day: 2.0 packs/day for 20.0 years (40.0 ttl pk-yrs)    Types: Cigarettes    Start date: 36    Quit date: 101    Years since quitting: 44.8   Smokeless tobacco: Never   Tobacco comments:    Stopped early 78s  Vaping Use   Vaping status: Never Used  Substance and Sexual Activity   Alcohol use: No   Drug use: No   Sexual activity: Yes  Other Topics Concern   Not on file  Social History Narrative   Lives at home with spouse   Right handed   Caffeine: 2 cups/day, tea seldom   Social Determinants of Health   Financial Resource Strain: Not on file  Food Insecurity: No Food Insecurity (09/01/2022)   Hunger Vital Sign    Worried About Running Out of Food in the Last Year: Never true    Ran Out of Food in the Last Year: Never true  Transportation Needs: No Transportation Needs (09/01/2022)   PRAPARE - Administrator, Civil Service (Medical): No    Lack of  Transportation (Non-Medical): No  Physical Activity: Not on file  Stress: Not on file  Social Connections: Not on file  Intimate Partner Violence: Not At Risk (09/01/2022)   Humiliation, Afraid, Rape, and Kick questionnaire    Fear of Current or Ex-Partner: No    Emotionally Abused: No    Physically Abused: No    Sexually Abused: No    Family History  Problem Relation Age of Onset   Cancer Mother    CVA Mother    Cancer Maternal Grandmother    Healthy Son    Colon cancer Neg Hx     Past Medical History:  Diagnosis Date   Anxiety and depression    Chronic renal insufficiency, stage 3 (moderate) (HCC)    DDD (degenerative disc disease), lumbar    remote hx of fusion surgery   Dysrhythmia    isolated afib/flutter   GERD (gastroesophageal reflux disease)    Gout    History of stomach ulcers 2000   Hyperlipidemia    Hypertension    NAUSEA WITH VOMITING 10/15/2010   Rheumatoid arthritis (HCC)    Rheumatoid     Patient Active Problem List   Diagnosis Date Noted   Encephalopathy 09/01/2022   Malnutrition of moderate degree 01/11/2022   Acute metabolic encephalopathy 01/11/2022   Acute diverticulitis 01/09/2022   Acute renal failure superimposed on stage 3b chronic kidney disease (HCC) 01/09/2022   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   Chronic daily headache 01/03/2022   Altered mental status 12/27/2021   Headache 12/27/2021   Periorbital pain, bilateral 12/27/2021   Anxiety and depression 12/27/2021   Chronic gouty arthritis 04/23/2021   Chronic pain 04/23/2021   Essential hypertension 04/23/2021   Fatigue 04/23/2021   Hyperlipidemia 04/23/2021   Insomnia 04/23/2021   Rheumatoid arthritis (HCC) 04/23/2021   Intermediate stage nonexudative age-related macular degeneration of both eyes 07/22/2020   Posterior vitreous detachment of right eye 07/22/2020   Vitreomacular adhesion of left eye 07/22/2020   Cholelithiasis with chronic cholecystitis 06/05/2017    Umbilical hernia 06/05/2017   Cholelithiasis 05/09/2017   GERD 10/15/2010   NAUSEA WITH VOMITING 10/15/2010   COLONIC POLYPS 11/25/2008   Gastric ulcer 11/25/2008   Duodenal ulcer without hemorrhage or perforation and  without obstruction 11/25/2008   Diverticulosis of colon 11/25/2008   Rheumatoid arthritis (HCC) 11/25/2008   NAUSEA 11/25/2008   ABDOMINAL PAIN-EPIGASTRIC 11/25/2008   ABDOMINAL PAIN -GENERALIZED 11/25/2008    Past Surgical History:  Procedure Laterality Date   APPENDECTOMY  1968   back fusion  2000   CHOLECYSTECTOMY N/A 06/06/2017   Procedure: LAPAROSCOPIC CHOLECYSTECTOMY WITH INTRAOPERATIVE CHOLANGIOGRAM;  Surgeon: Darnell Level, MD;  Location: [facility];  Service: General;  Laterality: N/A;   CRANIOTOMY N/A 10/20/2022   Procedure: TRANSSPHENOIDAL RESECTION OF PITUITARY TUMOR;  Surgeon: Tressie Stalker, MD;  Location: [facility] OR;  Service: Neurosurgery;  Laterality: N/A;   EYE SURGERY     Dr. Nile Riggs  lens implants   LUMBAR LAMINECTOMY  2000   NASAL SEPTOPLASTY W/ TURBINOPLASTY Bilateral 10/20/2022   Procedure: NASAL SEPTOPLASTY;  Surgeon: Laren Boom, DO;  Location: [facility] OR;  Service: ENT;  Laterality: Bilateral;   TIBIA FRACTURE SURGERY Left 1998   with titanium rods   TRANSPHENOIDAL APPROACH EXPOSURE Bilateral 10/20/2022   Procedure: TRANSPHENOIDAL APPROACH EXPOSURE;  Surgeon: Laren Boom, DO;  Location: [facility] OR;  Service: ENT;  Laterality: Bilateral;   UMBILICAL HERNIA REPAIR N/A 06/06/2017   Procedure: UMBILICAL HERNIA REPAIR;  Surgeon: Darnell Level, MD;  Location: [facility];  Service: General;  Laterality: N/A;    Current Outpatient Medications  Medication Sig Dispense Refill   allopurinol (ZYLOPRIM) 300 MG tablet Take 300 mg by mouth daily.     escitalopram (LEXAPRO) 20 MG tablet Take 20 mg by mouth at bedtime.      etanercept (ENBREL SURECLICK) 50 MG/ML injection Inject into the skin.     folic acid (FOLVITE) 1 MG tablet Take 1 mg by mouth daily.      levothyroxine (SYNTHROID) 25 MCG tablet Take 12.5 mcg by mouth.     losartan (COZAAR) 100 MG tablet Take 100 mg by mouth daily.     Melatonin 10 MG TABS Take 10 mg by mouth at bedtime as needed.     omeprazole (PRILOSEC) 40 MG capsule Take 40 mg by mouth daily. 30 minutes before a meal     pravastatin (PRAVACHOL) 40 MG tablet Take 40 mg by mouth daily.     predniSONE (DELTASONE) 20 MG tablet Take 20 mg by mouth daily.     propranolol ER (INDERAL LA) 60 MG 24 hr capsule Take 1 capsule (60 mg total) by mouth daily. Please call and schedule appointment for March 2024 or sooner if symptoms change. 90 capsule 3   tiZANidine (ZANAFLEX) 2 MG tablet Take 2 mg by mouth 3 (three) times daily as needed.     traMADol (ULTRAM) 50 MG tablet Take 50 mg by mouth 3 (three) times daily as needed.     traZODone (DESYREL) 50 MG tablet Take 50 mg by mouth at bedtime as needed.     acetaminophen (TYLENOL) 500 MG tablet Take 1 tablet (500 mg total) by mouth every 6 (six) hours as needed for mild pain or headache. 30 tablet 0   amLODipine (NORVASC) 5 MG tablet Take 5 mg by mouth daily.     aspirin EC 81 MG tablet Take 1 tablet (81 mg total) by mouth daily. Swallow whole. 30 tablet 12   losartan-hydrochlorothiazide (HYZAAR) 100-25 MG tablet Take 1 tablet by mouth daily.     pantoprazole (PROTONIX) 40 MG tablet Take 40 mg by mouth at bedtime.     traMADol-acetaminophen (ULTRACET) 37.5-325 MG tablet Take 1 tablet by mouth daily.  No current facility-administered medications for this visit.    Allergies as of 06/13/2023   (No Known Allergies)    Vitals: BP (!) 152/74 (BP Location: Right Arm, Patient Position: Sitting)   Pulse (!) 58   Ht 5\' 7"  (1.702 m)   Wt 149 lb (67.6 kg)   BMI 23.34 kg/m  Last Weight:  Wt Readings from Last 1 Encounters:  06/13/23 149 lb (67.6 kg)   Last Height:   Ht Readings from Last 1 Encounters:  06/13/23 5\' 7"  (1.702 m)                    06/13/2023   11:33 AM  MMSE - Mini  Mental State Exam  Orientation to time 4  Orientation to Place 5  Registration 3  Attention/ Calculation 3  Recall 0  Language- name 2 objects 2  Language- repeat 1  Language- follow 3 step command 3  Language- read & follow direction 1  Write a sentence 1  Copy design 1  Total score 24     Physical exam: Exam: Gen: NAD, conversant, well nourised, well groomed                     CV: RRR. No Carotid Bruits. No peripheral edema, warm, nontender Eyes: Conjunctivae clear without exudates or hemorrhage  Neuro: Detailed Neurologic Exam  Speech:    Speech is normal; fluent and spontaneous with normal comprehension.  Cognition:    06/13/2023   11:33 AM  MMSE - Mini Mental State Exam  Orientation to time 4  Orientation to Place 5  Registration 3  Attention/ Calculation 3  Recall 0  Language- name 2 objects 2  Language- repeat 1  Language- follow 3 step command 3  Language- read & follow direction 1  Write a sentence 1  Copy design 1  Total score 24    Cranial Nerves:    The pupils are equal, round, and reactive to light. The fundi are normal and spontaneous venous pulsations are present. Visual fields are full. Extraocular movements are intact. Trigeminal sensation is intact and the muscles of mastication are normal. The face is symmetric. The palate elevates in the midline. Hearing intact. Voice is normal. Shoulder shrug is normal. The tongue has normal motion without fasciculations.   Coordination: nml  Gait: nml  Motor Observation:    No asymmetry, no atrophy, and no involuntary movements noted. Tone:    Normal muscle tone.    Posture:    Posture is normal. normal erect    Strength:    Strength is V/V in the upper and lower limbs.      Sensation: intact to LT     Reflex Exam:  DTR's:    Deep tendon reflexes in the upper and lower extremities are 2+ bilaterally.   Toes:    The toes are equivocal bilaterally.   Clonus:    Clonus is  absent.   Assessment/Plan:   77 y.o. male here due to cognitive and behavior symptoms, his wife provided primary care with details on why she is worried however she did not make it today and he denies most of what she says.Based on our conversation, he needs an evaluation for his cognitive and behavioral changes.  He has a past medical history of hypertension, hyperlipidemia, CKD stage III, anxiety depression and GERD. MMSE 24/30.  He states his wife told him he was up in the middle of the night wandering around. He is  only aware of one episode that occurred. He states he has randomly, in the middle of the day, gone to bed and didn't get up until the next day. He doesn't know why he did that.  He says he gets confused, (he is a very poor historian), he says he remembers these episodes of confusion and denies loss of consciousness "My wife seems to think it is worse than I do". He denies most of the reports from his wifHe gets dizzy when he stands up(He is iothostatic today in the office), his wife keeps telling him to stand up slowly, he says he drinks 3-4 bottles of water a day plus coffee. States he is dizzy every day. He still has severe back pain and takine 50mg  Tramadol x 3 times a day for it.   Evaluate for stroke and/or seizures and dementia. Can also consider sleep apnea test and testing for memory problems after this evaluation at follow up can discuss and need to have your wife present. In 3 months will discuss ordering more dementia-specific tests.  Dizziness - He is orthostatic today, needs follow up with primary care  BP 152/74    Orthostatic BP  147/75 118/70  BP Location Right Arm Left Arm Left Arm  Patient Position Sitting Supine Standing  Pulse 58    Orthostatic Pulse  57 61   Headaches - improved after removal of pituitary adenoma.  Cognitive and behavioral changes: - Would consider adjusting his daily Tramadol use(takes it 3x a day, this can cause confusion in the elderly). EEG in  the office - schedule at check out. MRI brain wo contrast. At next appointment when wife present can discuss formal neurocognitive testing, testing for amyloid protein and other dementia markers, and then consider follow up testing based on results. Need wife present, I am not entirely sure patient with be compliant.  Orders Placed This Encounter  Procedures   MR BRAIN WO CONTRAST   EEG adult      Cc: Linus Galas, NP,  Linus Galas, NP  Naomie Dean, MD  [facility] [address] [place], [place] [address]  Phone [phone] Fax [phone]  I spent over 40 minutes of face-to-face and non-face-to-face time with patient on the  1. Cognitive decline   2. Altered awareness, transient   3. Major neurocognitive disorder (HCC)    diagnosis.  This included previsit chart review, lab review, study review, order entry, electronic health record documentation, patient education on the different diagnostic and therapeutic options, counseling and coordination of care, risks and benefits of management, compliance, or risk factor reduction

## 2023-06-15 ENCOUNTER — Telehealth: Payer: Self-pay | Admitting: Neurology

## 2023-06-15 DIAGNOSIS — M5126 Other intervertebral disc displacement, lumbar region: Secondary | ICD-10-CM | POA: Diagnosis not present

## 2023-06-15 DIAGNOSIS — M545 Low back pain, unspecified: Secondary | ICD-10-CM | POA: Diagnosis not present

## 2023-06-15 NOTE — Telephone Encounter (Signed)
[employer] medicare NPR sent to GI [phone] 

## 2023-06-16 ENCOUNTER — Other Ambulatory Visit: Payer: Self-pay | Admitting: Neurology

## 2023-06-16 DIAGNOSIS — T1590XA Foreign body on external eye, part unspecified, unspecified eye, initial encounter: Secondary | ICD-10-CM

## 2023-06-16 DIAGNOSIS — R404 Transient alteration of awareness: Secondary | ICD-10-CM

## 2023-06-16 DIAGNOSIS — R4189 Other symptoms and signs involving cognitive functions and awareness: Secondary | ICD-10-CM

## 2023-06-16 DIAGNOSIS — F0390 Unspecified dementia without behavioral disturbance: Secondary | ICD-10-CM

## 2023-06-19 DIAGNOSIS — N184 Chronic kidney disease, stage 4 (severe): Secondary | ICD-10-CM | POA: Diagnosis not present

## 2023-06-19 DIAGNOSIS — R7989 Other specified abnormal findings of blood chemistry: Secondary | ICD-10-CM | POA: Diagnosis not present

## 2023-06-19 DIAGNOSIS — E7800 Pure hypercholesterolemia, unspecified: Secondary | ICD-10-CM | POA: Diagnosis not present

## 2023-06-19 DIAGNOSIS — R7303 Prediabetes: Secondary | ICD-10-CM | POA: Diagnosis not present

## 2023-06-26 DIAGNOSIS — N184 Chronic kidney disease, stage 4 (severe): Secondary | ICD-10-CM | POA: Diagnosis not present

## 2023-06-26 DIAGNOSIS — G4700 Insomnia, unspecified: Secondary | ICD-10-CM | POA: Diagnosis not present

## 2023-06-26 DIAGNOSIS — Z23 Encounter for immunization: Secondary | ICD-10-CM | POA: Diagnosis not present

## 2023-06-26 DIAGNOSIS — Z0000 Encounter for general adult medical examination without abnormal findings: Secondary | ICD-10-CM | POA: Diagnosis not present

## 2023-06-26 DIAGNOSIS — I10 Essential (primary) hypertension: Secondary | ICD-10-CM | POA: Diagnosis not present

## 2023-06-26 DIAGNOSIS — R7303 Prediabetes: Secondary | ICD-10-CM | POA: Diagnosis not present

## 2023-06-26 DIAGNOSIS — G894 Chronic pain syndrome: Secondary | ICD-10-CM | POA: Diagnosis not present

## 2023-06-26 DIAGNOSIS — M0589 Other rheumatoid arthritis with rheumatoid factor of multiple sites: Secondary | ICD-10-CM | POA: Diagnosis not present

## 2023-06-26 DIAGNOSIS — D72828 Other elevated white blood cell count: Secondary | ICD-10-CM | POA: Diagnosis not present

## 2023-06-26 DIAGNOSIS — M1A0790 Idiopathic chronic gout, unspecified ankle and foot, without tophus (tophi): Secondary | ICD-10-CM | POA: Diagnosis not present

## 2023-06-26 DIAGNOSIS — E7800 Pure hypercholesterolemia, unspecified: Secondary | ICD-10-CM | POA: Diagnosis not present

## 2023-06-29 ENCOUNTER — Ambulatory Visit
Admission: RE | Admit: 2023-06-29 | Discharge: 2023-06-29 | Disposition: A | Discharge status: Home / Self Care | Payer: Medicare Other | Source: Ambulatory Visit | Attending: Neurology | Admitting: Neurology

## 2023-06-29 DIAGNOSIS — T1590XA Foreign body on external eye, part unspecified, unspecified eye, initial encounter: Secondary | ICD-10-CM

## 2023-06-29 DIAGNOSIS — R4189 Other symptoms and signs involving cognitive functions and awareness: Secondary | ICD-10-CM | POA: Diagnosis not present

## 2023-06-29 DIAGNOSIS — R404 Transient alteration of awareness: Secondary | ICD-10-CM | POA: Diagnosis not present

## 2023-06-29 DIAGNOSIS — F0390 Unspecified dementia without behavioral disturbance: Secondary | ICD-10-CM

## 2023-06-29 DIAGNOSIS — Z0189 Encounter for other specified special examinations: Secondary | ICD-10-CM | POA: Diagnosis not present

## 2023-07-02 ENCOUNTER — Other Ambulatory Visit: Payer: Self-pay | Admitting: Adult Health

## 2023-07-03 ENCOUNTER — Encounter: Payer: Self-pay | Admitting: Orthopaedic Surgery

## 2023-07-03 ENCOUNTER — Other Ambulatory Visit: Payer: Self-pay | Admitting: Orthopaedic Surgery

## 2023-07-03 DIAGNOSIS — M5126 Other intervertebral disc displacement, lumbar region: Secondary | ICD-10-CM

## 2023-07-17 ENCOUNTER — Encounter: Payer: Self-pay | Admitting: *Deleted

## 2023-07-17 ENCOUNTER — Other Ambulatory Visit: Payer: Medicare Other | Admitting: *Deleted

## 2023-07-18 ENCOUNTER — Encounter: Payer: Self-pay | Admitting: Orthopaedic Surgery

## 2023-07-21 NOTE — Discharge Instructions (Signed)

## 2023-07-22 ENCOUNTER — Telehealth: Payer: Self-pay | Admitting: Neurology

## 2023-07-22 NOTE — Telephone Encounter (Signed)
Pod4: Would you call and talk to patient and hopefully also talk to his wife if she is on the HIPAA form please with results. I need her to come with him to the next appointment, so want to make sure she knows about it(she did not come last time which was difficult).Patient's wife was complaining of his memory loss, patient was not and he came alone. I need wife there to evaluate him and discuss with them.    Hi Wisam, there was nothing new in your brain but it did show brain volume loss on the area of the brain that we worry about Alzheimers(This is NOT a dementia diagnosis, just a finding on MRI we have to further evaluate). It also showed some "moderately severe chronic microvascular ischemic changes" which are cloged blood vessels which can also cause memory loss. We can discuss further at your follow up but I would like you to bring your wife. Thanks, Dr. Lucia Gaskins cc Linus Galas   MRI brain: IMPRESSION: This MRI of the brain without contrast shows the following: Mild to moderate generalized cortical atrophy that is more severe in the medial temporal lobes.  This pattern is often seen with Alzheimer's disease. Extensive T2/FLAIR hyperintense foci in the cerebral hemispheres and to a lesser extent in the basal ganglia and infratentorial white matter in a pattern consistent with moderately severe chronic microvascular ischemic changes.  None of the foci appear to be acute and there is no change compared to the 02/06/2021 MRI   I forwarded MRI to his pcp as well

## 2023-07-22 NOTE — Progress Notes (Signed)
Pod4: See phone note. Would you call and talk to patient and hopefully also talk to his wife if she is on the HIPAA form please. I need her to come with him to the next appointment, so want to make sure she knows about it(she did not come last time which was difficult) thanks : Hi [name], there was nothing new in your brain but it did show brain volume loss on the area of the brain that we worry about Alzheimers. It also showed some "moderately severe chronic microvascular ischemic changes" which are cloged blood vessels which can also cause memory loss. We can discuss further at your follow up but I would like you to bring your wife. Thanks, Dr. Lucia Gaskins cc Linus Galas

## 2023-07-24 ENCOUNTER — Other Ambulatory Visit: Payer: Medicare Other

## 2023-07-24 ENCOUNTER — Inpatient Hospital Stay
Admission: RE | Admit: 2023-07-24 | Discharge: 2023-07-24 | Disposition: A | Discharge status: Home / Self Care | Payer: Medicare Other | Source: Ambulatory Visit | Attending: Orthopaedic Surgery | Admitting: Orthopaedic Surgery

## 2023-07-24 NOTE — Telephone Encounter (Signed)
I called and no answer, could not leave message.

## 2023-07-25 ENCOUNTER — Ambulatory Visit: Payer: Medicare Other | Admitting: Neurology

## 2023-07-25 DIAGNOSIS — R4182 Altered mental status, unspecified: Secondary | ICD-10-CM | POA: Diagnosis not present

## 2023-07-25 DIAGNOSIS — R404 Transient alteration of awareness: Secondary | ICD-10-CM

## 2023-07-25 NOTE — Procedures (Signed)
    History:  77 year old man with transient alteration of awareness   EEG classification: Awake and drowsy  Duration: 26 minutes   Technical aspects: This EEG study was done with scalp electrodes positioned according to the 10-20 International system of electrode placement. Electrical activity was reviewed with band pass filter of 1-70Hz , sensitivity of 7 uV/mm, display speed of 90mm/sec with a 60Hz  notched filter applied as appropriate. EEG data were recorded continuously and digitally stored.   Description of the recording: The background rhythms of this recording consists of a fairly well modulated medium amplitude alpha rhythm of 9 Hz that is reactive to eye opening and closure. Present in the anterior head region is a 15-20 Hz beta activity. Photic stimulation was performed, did not show any abnormalities. Hyperventilation was also performed, did not show any abnormalities. Drowsiness was manifested by background fragmentation. No abnormal epileptiform discharges seen during this recording. There was no focal slowing. There were no electrographic seizure identified.   Abnormality: None   Impression: This is a normal EEG recorded while drowsy and awake. No evidence of interictal epileptiform discharges. Normal EEGs, however, do not rule out epilepsy.    Windell Norfolk, MD [facility]

## 2023-07-25 NOTE — Telephone Encounter (Signed)
I called pt and spoke to wife (on [place]). And gave her the results of the MRI, did show brain volume loss on the area of the brain that we worry about Alzheimers(This is NOT a dementia diagnosis, just a finding on MRI we have to further evaluate). It also showed some "moderately severe chronic microvascular ischemic changes" which are cloged blood vessels which can also cause memory loss. Will discuss further when in for follow up.  Wife to attend appt 10-16-2022.  Will call with EEG results when read.  Verbalized understanding.

## 2023-07-27 DIAGNOSIS — U071 COVID-19: Secondary | ICD-10-CM | POA: Diagnosis not present

## 2023-07-27 DIAGNOSIS — Z20822 Contact with and (suspected) exposure to covid-19: Secondary | ICD-10-CM | POA: Diagnosis not present

## 2023-08-02 ENCOUNTER — Other Ambulatory Visit: Payer: Medicare Other

## 2023-08-07 ENCOUNTER — Other Ambulatory Visit: Payer: Medicare Other

## 2023-08-14 DIAGNOSIS — M47816 Spondylosis without myelopathy or radiculopathy, lumbar region: Secondary | ICD-10-CM | POA: Diagnosis not present

## 2023-09-19 DIAGNOSIS — H348111 Central retinal vein occlusion, right eye, with retinal neovascularization: Secondary | ICD-10-CM | POA: Diagnosis not present

## 2023-09-21 DIAGNOSIS — E7800 Pure hypercholesterolemia, unspecified: Secondary | ICD-10-CM | POA: Diagnosis not present

## 2023-09-21 DIAGNOSIS — R7303 Prediabetes: Secondary | ICD-10-CM | POA: Diagnosis not present

## 2023-09-21 DIAGNOSIS — R7989 Other specified abnormal findings of blood chemistry: Secondary | ICD-10-CM | POA: Diagnosis not present

## 2023-09-28 ENCOUNTER — Other Ambulatory Visit (HOSPITAL_BASED_OUTPATIENT_CLINIC_OR_DEPARTMENT_OTHER): Payer: Self-pay

## 2023-09-28 DIAGNOSIS — G4700 Insomnia, unspecified: Secondary | ICD-10-CM | POA: Diagnosis not present

## 2023-09-28 DIAGNOSIS — M1A0790 Idiopathic chronic gout, unspecified ankle and foot, without tophus (tophi): Secondary | ICD-10-CM | POA: Diagnosis not present

## 2023-09-28 DIAGNOSIS — R5383 Other fatigue: Secondary | ICD-10-CM | POA: Diagnosis not present

## 2023-09-28 DIAGNOSIS — R011 Cardiac murmur, unspecified: Secondary | ICD-10-CM

## 2023-09-28 DIAGNOSIS — E7800 Pure hypercholesterolemia, unspecified: Secondary | ICD-10-CM

## 2023-09-28 DIAGNOSIS — R0602 Shortness of breath: Secondary | ICD-10-CM | POA: Diagnosis not present

## 2023-09-28 DIAGNOSIS — M0589 Other rheumatoid arthritis with rheumatoid factor of multiple sites: Secondary | ICD-10-CM | POA: Diagnosis not present

## 2023-09-28 DIAGNOSIS — G8929 Other chronic pain: Secondary | ICD-10-CM | POA: Diagnosis not present

## 2023-09-28 DIAGNOSIS — D72828 Other elevated white blood cell count: Secondary | ICD-10-CM | POA: Diagnosis not present

## 2023-09-28 DIAGNOSIS — N184 Chronic kidney disease, stage 4 (severe): Secondary | ICD-10-CM | POA: Diagnosis not present

## 2023-09-28 DIAGNOSIS — I10 Essential (primary) hypertension: Secondary | ICD-10-CM | POA: Diagnosis not present

## 2023-10-11 DIAGNOSIS — R0602 Shortness of breath: Secondary | ICD-10-CM | POA: Diagnosis not present

## 2023-10-13 ENCOUNTER — Encounter (HOSPITAL_COMMUNITY): Payer: Self-pay

## 2023-10-13 ENCOUNTER — Ambulatory Visit (HOSPITAL_COMMUNITY)
Admission: RE | Admit: 2023-10-13 | Discharge: 2023-10-13 | Disposition: A | Discharge status: Home / Self Care | Payer: Self-pay | Source: Ambulatory Visit

## 2023-10-13 DIAGNOSIS — E7800 Pure hypercholesterolemia, unspecified: Secondary | ICD-10-CM | POA: Insufficient documentation

## 2023-10-13 DIAGNOSIS — R011 Cardiac murmur, unspecified: Secondary | ICD-10-CM | POA: Insufficient documentation

## 2023-10-13 DIAGNOSIS — R0602 Shortness of breath: Secondary | ICD-10-CM | POA: Insufficient documentation

## 2023-10-13 DIAGNOSIS — R5383 Other fatigue: Secondary | ICD-10-CM | POA: Insufficient documentation

## 2023-10-17 ENCOUNTER — Encounter: Payer: Self-pay | Admitting: Neurology

## 2023-10-17 ENCOUNTER — Ambulatory Visit (INDEPENDENT_AMBULATORY_CARE_PROVIDER_SITE_OTHER): Payer: Medicare Other | Admitting: Neurology

## 2023-10-17 VITALS — BP 156/78 | HR 74 | Ht 67.0 in | Wt 157.0 lb

## 2023-10-17 DIAGNOSIS — G3184 Mild cognitive impairment, so stated: Secondary | ICD-10-CM

## 2023-10-17 DIAGNOSIS — R4189 Other symptoms and signs involving cognitive functions and awareness: Secondary | ICD-10-CM | POA: Diagnosis not present

## 2023-10-17 DIAGNOSIS — I679 Cerebrovascular disease, unspecified: Secondary | ICD-10-CM

## 2023-10-17 MED ORDER — DONEPEZIL HCL 5 MG PO TABS: 5.0000 mg | ORAL_TABLET | Freq: Every day | ORAL | 2 refills | Status: DC

## 2023-10-17 NOTE — Progress Notes (Signed)
 [facility]    Provider:  Dr Lucia Gaskins Requesting Provider: Linus Galas, NP Primary Care Provider:  Linus Galas, NP  10/17/2023: Here with wife. He forgets mostly short-term events. The other day they spoke about going to one restaurant, he told his son a different one, and kept forgetting which restaurant. There are times he asks the same questions in the same day or over and over but not every day. When he drives he does not get lost. No problems managing finances, he pays bills the same day he gets them. Sometimes doesn't know the day but always knows the year and month and always dresses appropriately for the season. He still social every day. He goes to [employer] every day to "hang out", he has arthritis so he can't be in the shop as much or ride motorcycle. They both feel safe with his driving. Not getting lost. He is forgetting dates, he knows they are coming and finds out when it is he always asks or wife will remind him, so is having problems remembering important dates. Wife keeps up with his appointments for him. Not misplacing things more than normal. No probkems with reasining or judgement. No further episodes of confusion or seizure-like events. He has had some days he felt bad and he sits and seems sad but not often, not daily. He has hearing problems, recommend hearing tested as this has linked to memory decline. He snaps sometimes over nothing but no significant agitation or behavioral changes. No other focal neurologic deficits, associated symptoms, inciting events or modifiable factors.  Reviewed images with patient and wife and discussed:  [facility] [address] [place], [place] [address] [phone]   NEUROIMAGING REPORT     STUDY DATE: 06/29/2023 PATIENT NAME: [name] DOB: 79/24/1947 MRN: [id number]   EXAM: MRI Brain without contrast   ORDERING CLINICIAN: Naomie Dean, MD CLINICAL HISTORY: 78 year old man with  altered awareness and cognitive decline COMPARISON FILMS: MRI 02/07/2023   TECHNIQUE: MRI of the brain without contrast was obtained utilizing 5 mm axial slices with T1, T2, T2 flair, SWI and diffusion weighted views.  T1 sagittal and T2 coronal views were obtained. CONTRAST: none IMAGING SITE: [facility], [address] [place], [place]   FINDINGS: On sagittal images, the spinal cord is imaged caudally to C3 and is normal in caliber.   The contents of the posterior fossa are of normal size and position.   There is sequela of a transsphenoidal pituitary procedure.  The infundibulum of the pituitary gland is slightly deviated to the left, unchanged compared to the MRI from 02/07/2023.  The optic chiasm appear normal.    There is mild to moderate generalized cortical atrophy that is more advanced in the medial temporal lobes..  This is unchanged compared to the 02/07/2023 MRI.  There is also mild corpus callosal atrophy.  There are no abnormal extra-axial collections of fluid.     There are multiple T2/FLAIR hyperintense single and confluent foci in the cerebral hemispheres, predominantly in the subcortical and deep white matter.  The extent appears unchanged compared to the 02/07/2023 MRI.  Some foci are noted in the basal ganglia, pons and cerebellum.    Diffusion weighted images are normal.  Susceptibility weighted images are normal.     There have been bilateral lens replacements.  Otherwise, the orbits appear normal.   The VIIth/VIIIth nerve complex appears normal.  The mastoid air cells appear normal.  Mucoperiosteal thickening is noted in the sphenoid  sinus, many of the ethmoid air cells and the maxillary sinuses, right greater than left.  The frontal sinuses appear normal.  Flow voids are identified within the major intracerebral arteries.        IMPRESSION: This MRI of the brain without contrast shows the following: Mild to moderate generalized cortical atrophy that is more severe in the medial  temporal lobes.  This pattern is often seen with Alzheimer's disease. Extensive T2/FLAIR hyperintense foci in the cerebral hemispheres and to a lesser extent in the basal ganglia and infratentorial white matter in a pattern consistent with moderately severe chronic microvascular ischemic changes.  None of the foci appear to be acute and there is no change compared to the 02/06/2021 MRI Chronic sphenoid, ethmoid and maxillary sinusitis. No acute findings.   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 He states the episode happened a month ago. He doesn't remember details. He states his wife told him he was up in the middle of the night wandering around. He is only aware of one episode that occurred. He states has in randomly in the middle of the day gone to bed and didn't get up until the next day. He doesn't know why he did that. MMSE 24/30 Animals x 11. Discussed it appears his wife is worried about him but she is not here today, patient states she couldn't make it. He says he had episodes of confusion but last was a month ago. Sometimes he goes to bed and stays there all day he sleep all day and all night. He states he did that last month and no symptoms in the last month. He says he gets confused, (he is a very poor historian), he says he remembers these episodes of confusion and denies loss of consciousness "My wife seems to think it is worse than I do". He states he doesn't get the headaches much at all. The surgery to remove the pituitary tumor helped a lot because he was having bad headaches and now much improved. I could only review the notes from pcp on what his wife said, he denies forgetting what he has eaten or when he has slept, he says he just tells his wife he doesn;t remember and she takes it more  serious than him, He states he does not eat very much. He denies not knowing where he went. He states he had not ridden his bike in a while and had back pain due to not being on the motorcycle all day. He categorically denies any of his wife's reports that I can see in the consult. And since wife is not here it is difficult to get a complete history. He gets dizzy when he stands up(He is iothostatic today in the office), his wife keeps telling him to stand up slowly, he says he drinks 3-4 bottles of water a day plus coffee. States he is dizzy every day. He still has severe back pain and takine 50mg  Tramadol x 3 times a day for it. He has a new back doctor and an appointment on Thursday. He denies any stroke-like symptoms. No weakness, no problems swallowing, no falls, not getting lost, he pays the bills and he states he does not forget he has a phone bill and a light bill only, they get paid. No accidents in  the home or with the car. No falls. No vision changes or weakness. He does endorse dizziness.   Patient complains of symptoms per HPI as well as the following symptoms: none . Pertinent negatives and positives per HPI. All others negative   Follow-up Feb 02, 2022: Since patient has been seen, he has been admitted into the hospital, repeat MRI of the brain showed a stable pituitary adenoma, he is also been seen by endocrinology and neurosurgery. neurosurgery. Still pending ophthalmology. He saw endocrinology and the tumor would not be responsive to any medication so if it grows will need to be removal. His headache is better. He is seeing ophthalmology June 12th, Seeing Dr. Cleda Daub eye doctor, they will send me a note. He does not feel his vision is worsening. Hasn;t had a headache since leaving the hospital. Repeat MRI brain was stable. Would repeat in 3-4 months unless having symptoms. So far no new symptoms. We will call to repeat it in 3 . Seeing NSY in August will repeat in prior to neurosurgery 4th. We  will repeat MRI early August. Will call the early July to remind him that repeat it and schedule it late July in preparation for neurosurgery appointment. He feels good right now  Patient complains of symptoms per HPI as well as the following symptoms: diverticulitis . Pertinent negatives and positives per HPI. All others negative  Repeat MRI brain/orbits 01/14/2022: MRI ORBITS FINDINGS   Orbits: Very limited by motion. No detected optic nerve edema. Unremarkable appearance of the globes, orbital fat, extraocular muscles, and lacrimal glands.   Visualized sinuses: Mild mucosal thickening in ethmoid sinuses.   Soft tissues: Negative   IMPRESSION: 1. Significantly motion degraded MRI of the brain and orbits. 2. Unchanged appearance of pituitary macro adenoma when compared to the MRI last month. No optic nerve mass effect or signs of apoplexy. 3. No acute finding in the brain or orbits. 4. Chronic small vessel ischemia.  Personally reviewed images additional 5 minutes (in addition to appointment time otp today)   HPI:  [name] is a 78 y.o. male here as requested by Linus Galas, NP for follow-up after being seen in the emergency room.  He has a past medical history of hypertension, hyperlipidemia, CKD stage III, anxiety depression and GERD.  He presented recently after being found to be acutely altered, having constant headache for over a month but acutely worse over the several last week, pain behind the eyes but usually worse on the right, sensitivity to light and sound, home pain medication such as Tylenol Benadryl without relief, associated symptoms dizziness nausea and reportedly an episode of vomiting.  No other significant symptoms such as fever shortness of breath cough chest pain or diarrhea.  He went to see his eye doctor who sent him to the emergency room as he was disoriented and unable to write his name, EMS was called.  He was admitted, he was seen as a code stroke, CT of the  head was unremarkable but does show signs of normal pressure hydrocephalus, white blood cells were 12, BUN 23, creatinine 1.67, alcohol level was undetectable, MRI was obtained.  Neurology was consulted and given a migraine cocktail which abated his headache.  Ophthalmology was consulted and he was found to have a newly diagnosed pituitary mass with bitemporal field cut mild disc edema.  His ophthalmology exam showed no emergent or otherwise medical issues or ocular issues to explain his pain.  Ophthalmology recommended formal visual field testing on  outpatient basis and establishment with neurosurgery  Patient reports headaches ongoing for the past 2 to 3 months in the setting of pituitary macroadenoma, present in the morning lasting all day worsening around noon, no worsening headaches with increased intracranial pressure such as bowel movements, changing positions waking up in the middle the night coughing or sneezing, they are located frontally and periorbitally with right worse than left, nausea with vomiting, associated photophobia and phonophobia, transient blurriness and dizziness but no diplopia. Today it is throbbng around the eyes, nausea, vomiting, blurry vision, no drainage from nipples, migraine cocktail stopped it, came back the next day. He takes 2 aspirin and 2 pain pills this morning. Son is here and provides information. Pulsating/pounding/light sensitivity. Blurry vision. Pain is severe. Better when laying down.     Reviewed notes, labs and imaging from outside physicians, which showed: MRI HEAD FINDINGS   Brain: No acute infarction, hemorrhage, hydrocephalus, or extra-axial fluid collection. Approximately 1.3 cm pituitary mass with suprasellar extension. This mass comes in close proximity to the optic chiasm. Moderate patchy T2/FLAIR hyperintensities in the white matter, nonspecific but compatible with chronic microvascular ischemic disease. Cerebral atrophy.   Vascular: Major  arterial flow voids are maintained skull base.   Skull and upper cervical spine: Normal marrow signal.   Other: No mastoid effusions.   MRI ORBITS FINDINGS   Orbits: No orbital mass or evidence of inflammation. Normal appearance of the globes, optic nerve-sheath complexes, extraocular muscles, and lacrimal glands. Prominent retro bulbar fat bilaterally.   Visualized sinuses: Mild-to-moderate paranasal sinus mucosal thickening.   Soft tissues: Normal.   IMPRESSION: Due to patient pain, postcontrast imaging was not performed and study was terminated early. Within this limitation:   1. Approximately 1.3 cm pituitary mass with suprasellar extension. This mass comes in close proximity to the optic chiasm. Recommend dedicated pituitary protocol MRI with contrast for further characterization. 2. Otherwise, no evidence of acute intracranial or orbital abnormality. 3. 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    Review of records, medications tried that can be used in migraine/headache management include: Tylenol, Decadron, Fioricet, Lexapro, losartan, meloxicam magnesium, Zofran, prednisone, Compazine, nortriptyline/amitriptyline contraindicated due to age and risk of altered mental status due to tricyclic antidepressants, compazine, phenergan, trazodone  Review of Systems: Patient complains of symptoms per HPI as well as the following symptoms severe eye pain and headache. Pertinent negatives and positives per HPI. All others negative.   Social History   Socioeconomic History   Marital status: Married    Spouse name: Lelon Mast   Number of children: 1   Years of education: Not on file   Highest education level: Not on file  Occupational History   Not on file  Tobacco Use   Smoking status: Former    Current packs/day: 0.00    Average packs/day: 2.0 packs/day for 20.0 years (40.0 ttl pk-yrs)    Types: Cigarettes    Start date: 29    Quit date: 13     Years since quitting: 45.1   Smokeless tobacco: Never   Tobacco comments:    Stopped early 10s  Vaping Use   Vaping status: Never Used  Substance and Sexual Activity   Alcohol use: No   Drug use: No   Sexual activity: Yes  Other Topics Concern   Not on file  Social History Narrative   Lives at home with spouse   Right handed   Caffeine: 2 cups/day, tea seldom   Social Drivers of Health  Financial Resource Strain: Not on file  Food Insecurity: No Food Insecurity (09/01/2022)   Hunger Vital Sign    Worried About Running Out of Food in the Last Year: Never true    Ran Out of Food in the Last Year: Never true  Transportation Needs: No Transportation Needs (09/01/2022)   PRAPARE - Administrator, Civil Service (Medical): No    Lack of Transportation (Non-Medical): No  Physical Activity: Not on file  Stress: Not on file  Social Connections: Not on file  Intimate Partner Violence: Not At Risk (09/01/2022)   Humiliation, Afraid, Rape, and Kick questionnaire    Fear of Current or Ex-Partner: No    Emotionally Abused: No    Physically Abused: No    Sexually Abused: No    Family History  Problem Relation Age of Onset   Cancer Mother    CVA Mother    Cancer Maternal Grandmother    Healthy Son    Colon cancer Neg Hx     Past Medical History:  Diagnosis Date   Anxiety and depression    Chronic renal insufficiency, stage 3 (moderate) (HCC)    DDD (degenerative disc disease), lumbar    remote hx of fusion surgery   Dysrhythmia    isolated afib/flutter   GERD (gastroesophageal reflux disease)    Gout    History of stomach ulcers 2000   Hyperlipidemia    Hypertension    NAUSEA WITH VOMITING 10/15/2010   Rheumatoid arthritis (HCC)    Rheumatoid     Patient Active Problem List   Diagnosis Date Noted   Cerebrovascular disease 10/22/2023   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   Encephalopathy 09/01/2022   Malnutrition of  moderate degree 01/11/2022   Acute metabolic encephalopathy 01/11/2022   Acute diverticulitis 01/09/2022   Acute renal failure superimposed on stage 3b chronic kidney disease (HCC) 01/09/2022   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   Chronic daily headache 01/03/2022   Altered mental status 12/27/2021   Headache 12/27/2021   Periorbital pain, bilateral 12/27/2021   Anxiety and depression 12/27/2021   Chronic gouty arthritis 04/23/2021   Chronic pain 04/23/2021   Essential hypertension 04/23/2021   Fatigue 04/23/2021   Hyperlipidemia 04/23/2021   Insomnia 04/23/2021   Rheumatoid arthritis (HCC) 04/23/2021   Intermediate stage nonexudative age-related macular degeneration of both eyes 07/22/2020   Posterior vitreous detachment of right eye 07/22/2020   Vitreomacular adhesion of left eye 07/22/2020   Cholelithiasis with chronic cholecystitis 06/05/2017   Umbilical hernia 06/05/2017   Cholelithiasis 05/09/2017   GERD 10/15/2010   NAUSEA WITH VOMITING 10/15/2010   COLONIC POLYPS 11/25/2008   Gastric ulcer 11/25/2008   Duodenal ulcer without hemorrhage or perforation and without obstruction 11/25/2008   Diverticulosis of colon 11/25/2008   Rheumatoid arthritis (HCC) 11/25/2008   NAUSEA 11/25/2008   ABDOMINAL PAIN-EPIGASTRIC 11/25/2008   ABDOMINAL PAIN -GENERALIZED 11/25/2008    Past Surgical History:  Procedure Laterality Date   APPENDECTOMY  1968   back fusion  2000   CHOLECYSTECTOMY N/A 06/06/2017   Procedure: LAPAROSCOPIC CHOLECYSTECTOMY WITH INTRAOPERATIVE CHOLANGIOGRAM;  Surgeon: Darnell Level, MD;  Location: [facility];  Service: General;  Laterality: N/A;   CRANIOTOMY N/A 10/20/2022   Procedure: TRANSSPHENOIDAL RESECTION OF PITUITARY TUMOR;  Surgeon: Tressie Stalker, MD;  Location: [facility] OR;  Service: Neurosurgery;  Laterality: N/A;   EYE SURGERY     Dr. Nile Riggs  lens implants   LUMBAR LAMINECTOMY  2000   NASAL SEPTOPLASTY  W/ TURBINOPLASTY Bilateral 10/20/2022   Procedure:  NASAL SEPTOPLASTY;  Surgeon: Laren Boom, DO;  Location: [facility] OR;  Service: ENT;  Laterality: Bilateral;   TIBIA FRACTURE SURGERY Left 1998   with titanium rods   TRANSPHENOIDAL APPROACH EXPOSURE Bilateral 10/20/2022   Procedure: TRANSPHENOIDAL APPROACH EXPOSURE;  Surgeon: Laren Boom, DO;  Location: [facility] OR;  Service: ENT;  Laterality: Bilateral;   UMBILICAL HERNIA REPAIR N/A 06/06/2017   Procedure: UMBILICAL HERNIA REPAIR;  Surgeon: Darnell Level, MD;  Location: [facility];  Service: General;  Laterality: N/A;    Current Outpatient Medications  Medication Sig Dispense Refill   acetaminophen (TYLENOL) 500 MG tablet Take 1 tablet (500 mg total) by mouth every 6 (six) hours as needed for mild pain or headache. 30 tablet 0   allopurinol (ZYLOPRIM) 300 MG tablet Take 300 mg by mouth daily.     donepezil (ARICEPT) 5 MG tablet Take 1 tablet (5 mg total) by mouth at bedtime. 30 tablet 2   escitalopram (LEXAPRO) 20 MG tablet Take 20 mg by mouth at bedtime.      etanercept (ENBREL SURECLICK) 50 MG/ML injection Inject into the skin.     folic acid (FOLVITE) 1 MG tablet Take 1 mg by mouth daily.     levothyroxine (SYNTHROID) 25 MCG tablet Take 12.5 mcg by mouth.     losartan (COZAAR) 100 MG tablet Take 100 mg by mouth daily.     Melatonin 10 MG TABS Take 10 mg by mouth at bedtime as needed.     omeprazole (PRILOSEC) 40 MG capsule Take 40 mg by mouth daily. 30 minutes before a meal     pantoprazole (PROTONIX) 40 MG tablet Take 40 mg by mouth at bedtime.     pravastatin (PRAVACHOL) 40 MG tablet Take 40 mg by mouth daily.     predniSONE (DELTASONE) 20 MG tablet Take 20 mg by mouth daily.     propranolol ER (INDERAL LA) 60 MG 24 hr capsule TAKE 1 CAPSULE DAILY-CALL & SCHEDULE APPOINTMENT FOR MARCH 2024 OR SOONER IF SYMPTOMS CHANGE 90 capsule 3   tiZANidine (ZANAFLEX) 2 MG tablet Take 2 mg by mouth 3 (three) times daily as needed.     traMADol (ULTRAM) 50 MG tablet Take 50 mg by mouth 3 (three)  times daily as needed.     traZODone (DESYREL) 50 MG tablet Take 50 mg by mouth at bedtime as needed.     No current facility-administered medications for this visit.    Allergies as of 10/17/2023   (No Known Allergies)    Vitals: BP (!) 156/78 (BP Location: Right Arm, Patient Position: Sitting, Cuff Size: Normal)   Pulse 74   Ht 5\' 7"  (1.702 m)   Wt 157 lb (71.2 kg)   BMI 24.59 kg/m  Last Weight:  Wt Readings from Last 1 Encounters:  10/17/23 157 lb (71.2 kg)   Last Height:   Ht Readings from Last 1 Encounters:  10/17/23 5\' 7"  (1.702 m)  Physical exam: Exam: Gen: NAD, conversant, well nourised, obese, well groomed                     CV: RRR, no MRG. No Carotid Bruits. No peripheral edema, warm, nontender Eyes: Conjunctivae clear without exudates or hemorrhage  Neuro: Detailed Neurologic Exam  Speech:    Speech is normal; fluent and spontaneous with normal comprehension.  Cognition:    10/17/2023    1:06 PM 06/13/2023   11:33 AM  MMSE -  Mini Mental State Exam  Orientation to time 4 4  Orientation to Place 5 5  Registration 3 3  Attention/ Calculation 5 3  Recall 1 0  Language- name 2 objects 2 2  Language- repeat 1 1  Language- follow 3 step command 3 3  Language- read & follow direction 1 1  Write a sentence 0 1  Copy design 1 1  Total score 26 24    Cranial Nerves:    The pupils are equal, round, and reactive to light. Pupils too small to visualize fundi attempted Visual fields are full to finger confrontation. Extraocular movements are intact. Trigeminal sensation is intact and the muscles of mastication are normal. The face is symmetric. The palate elevates in the midline. Hearing intact. Voice is normal. Shoulder shrug is normal. The tongue has normal motion without fasciculations.   Coordination: nml  Gait: nml  Motor Observation:    No asymmetry, no atrophy, and no involuntary movements noted. Tone:    Normal muscle tone.    Posture:     Posture is normal. normal erect    Strength:    Strength is V/V in the upper and lower limbs.      Sensation: intact to LT     Reflex Exam:  DTR's:    Deep tendon reflexes in the upper and lower extremities are symmetrical bilaterally.   Toes:    The toes are downgoing bilaterally.   Clonus:    Clonus is absent.   Assessment/Plan:   78 y.o. male here due to cognitive and behavior symptoms, his wife provided much information today. He has moderate to severe chronic microvascular changes in the brain with advanced atrophy in the mesial temporal lobes. 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 Dizziness - He is orthostatic today, needs follow up with primary care  They decline any further testing. Provided options as below. Will start aricept;  Qtc 417, pulse 74 Options: BELOW at this time they only want to start Aricept  1.start you on Donepezil/Aricept which is a memory loss medication developed for Alzheimer's but can help slow down memory loss in other conditions as well, can contact us and we can increase 2. Formal Neurocognitive testing (2-3 hours of memory testing) - declined 3. Could evaluate for alzheimers: Blood tests, genetic testing, CT PET Scan of the brain for alzheimers proteins, declined 4. Discussed Lecanemab or Donanemab - new meds just for alzheimers, they decline 5. Discussed vascular changes in the brain that can cause memory loss and other symptoms 6. Recommend asa 81 mg for stroke protection unless otherwise contraindicated, manage vascular risk factor  Headaches - improved after removal of pituitary adenoma.  Cognitive and behavioral changes: - Would consider adjusting his daily Tramadol use(takes it 3x a day, this can cause confusion in the elderly). EEG in the normal    BP 152/74     Orthostatic BP  147/75 118/70  BP Location Right Arm Left Arm Left Arm  Patient Position Sitting Supine Standing  Pulse 58    Orthostatic Pulse  57 61    Meds ordered this encounter  Medications   donepezil (ARICEPT) 5 MG tablet    Sig: Take 1 tablet (5 mg total) by mouth at bedtime.    Dispense:  30 tablet    Refill:  2     Cc: Arlana Pouch,  Florentina Addison, NP,  Linus Galas, NP  Naomie Dean, MD  [facility] [address] [place], [place] [address]  Phone [phone] Fax [phone]  I spent over 55 minutes of face-to-face and non-face-to-face time with patient on the  1. Cognitive impairment   2. Cerebrovascular disease   3. Cognitive decline   4. Amnestic MCI (mild cognitive impairment with memory loss)     diagnosis.  This included previsit chart review, lab review, study review, order entry, electronic health record documentation, patient education on the different diagnostic and therapeutic options, counseling and coordination of care, risks and benefits of management, compliance, or risk factor reduction

## 2023-10-17 NOTE — Patient Instructions (Addendum)
 Options: (declined at this time except for the donepezil)  1.start you on Donepezil/Aricept which is a memory loss medication developed for Alzheimer's but can help slow down memory loss in other conditions as well, can contact us for increase 2. Formal Neurocognitive testing (2-3 hours of memory testing) - 3. Could evaluate for alzheimers: Blood tests, genetic testing, CT PET Scan of the brain for alzheimers proteins 4. Discussed Lecanemab or Donanemab - new meds just for alzheimers 5. Discussed vascular changes in the brain that can cause memory loss and other symptoms 6. Recommend asa 81 mg for stroke protection unless otherwise contraindicated, manage vascular risk factor  Donepezil Tablets What is this medication? DONEPEZIL (doe NEP e zil) treats memory loss and confusion (dementia) in people who have Alzheimer disease. It works by improving attention, memory, and the ability to engage in daily activities. It is not a cure for dementia or Alzheimer disease. This medicine may be used for other purposes; ask your health care provider or pharmacist if you have questions. COMMON BRAND NAME(S): Aricept What should I tell my care team before I take this medication? They need to know if you have any of these conditions: Head injury Heart disease Irregular heartbeat or rhythm Liver disease Lung or breathing disease, such as asthma Seizures Stomach ulcers, other stomach or intestine problems Stomach bleeding Trouble passing urine An unusual or allergic reaction to donepezil, other medications, foods, dyes, or preservatives Pregnant or trying to get pregnant Breastfeeding How should I use this medication? Take this medication by mouth with a glass of water. Follow the directions on the prescription label. You may take this medication with or without food. Take this medication at regular intervals. This medication is usually taken before bedtime. Do not take it more often than directed.  Continue to take your medication even if you feel better. Do not stop taking except on your care team's advice. If you are taking the 23 mg donepezil tablet, swallow it whole; do not cut, crush, or chew it. Talk to your care team about the use of this medication in children. Special care may be needed. Overdosage: If you think you have taken too much of this medicine contact a poison control center or emergency room at once. NOTE: This medicine is only for you. Do not share this medicine with others. What if I miss a dose? If you miss a dose, take it as soon as you can. If it is almost time for your next dose, take only that dose, do not take double or extra doses. What may interact with this medication? Do not take this medication with any of the following: Certain medications for fungal infections, such as itraconazole, fluconazole, posaconazole, voriconazole Cisapride Dextromethorphan; quinidine Dronedarone Pimozide Quinidine Thioridazine This medication may also interact with the following: Antihistamines for allergy, cough, and cold Atropine Bethanechol Carbamazepine Certain medications for bladder problems, such as oxybutynin or tolterodine Certain medications for Parkinson disease, such as benztropine or trihexyphenidyl Certain medications for stomach problems, such as dicyclomine or hyoscyamine Certain medications for travel sickness, such as scopolamine Dexamethasone Dofetilide Ipratropium NSAIDs, medications for pain and inflammation, such as ibuprofen or naproxen Other medications for Alzheimer disease Other medications that cause heart rhythm changes Phenobarbital Phenytoin Rifampin, rifabutin, or rifapentine Ziprasidone This list may not describe all possible interactions. Give your health care provider a list of all the medicines, herbs, non-prescription drugs, or dietary supplements you use. Also tell them if you smoke, drink alcohol, or use illegal drugs. Some  items  may interact with your medicine. What should I watch for while using this medication? Visit your care team for regular checks on your progress. Tell your care team if your symptoms do not start to get better or if they get worse. This medication may affect your coordination, reaction time, or judgment. Do not drive or operate machinery until you know how this medication affects you. Sit up or stand slowly to reduce the risk of dizzy or fainting spells. Drinking alcohol with this medication can increase the risk of these side effects. What side effects may I notice from receiving this medication? Side effects that you should report to your care team as soon as possible: Allergic reactions--skin rash, itching, hives, swelling of the face, lips, tongue, or throat Peptic ulcer--burning stomach pain, loss of appetite, bloating, burping, heartburn, nausea, vomiting Seizures Slow heartbeat--dizziness, feeling faint or lightheaded, confusion, trouble breathing, unusual weakness or fatigue Stomach bleeding--bloody or black, tar-like stools, vomiting blood or brown material that looks like coffee grounds Trouble passing urine Side effects that usually do not require medical attention (report these to your care team if they continue or are bothersome): Diarrhea Fatigue Loss of appetite Muscle pain or cramps Nausea Trouble sleeping This list may not describe all possible side effects. Call your doctor for medical advice about side effects. You may report side effects to FDA at 1-800-FDA-1088. Where should I keep my medication? Keep out of reach of children. Store at room temperature between 15 and 30 degrees C (59 and 86 degrees F). Throw away any unused medication after the expiration date. NOTE: This sheet is a summary. It may not cover all possible information. If you have questions about this medicine, talk to your doctor, pharmacist, or health care provider.  2024 Elsevier/Gold Standard (2023-03-02  00:00:00)

## 2023-10-22 ENCOUNTER — Encounter: Payer: Self-pay | Admitting: Neurology

## 2023-10-22 DIAGNOSIS — I679 Cerebrovascular disease, unspecified: Secondary | ICD-10-CM | POA: Insufficient documentation

## 2023-10-22 DIAGNOSIS — G3184 Mild cognitive impairment, so stated: Secondary | ICD-10-CM | POA: Insufficient documentation

## 2023-11-03 DIAGNOSIS — R931 Abnormal findings on diagnostic imaging of heart and coronary circulation: Secondary | ICD-10-CM | POA: Diagnosis not present

## 2023-11-03 DIAGNOSIS — M47816 Spondylosis without myelopathy or radiculopathy, lumbar region: Secondary | ICD-10-CM | POA: Diagnosis not present

## 2023-11-03 DIAGNOSIS — R0602 Shortness of breath: Secondary | ICD-10-CM | POA: Diagnosis not present

## 2023-11-03 DIAGNOSIS — N184 Chronic kidney disease, stage 4 (severe): Secondary | ICD-10-CM | POA: Diagnosis not present

## 2023-11-03 DIAGNOSIS — G894 Chronic pain syndrome: Secondary | ICD-10-CM | POA: Diagnosis not present

## 2023-11-03 DIAGNOSIS — M1A09X0 Idiopathic chronic gout, multiple sites, without tophus (tophi): Secondary | ICD-10-CM | POA: Diagnosis not present

## 2023-11-03 DIAGNOSIS — I350 Nonrheumatic aortic (valve) stenosis: Secondary | ICD-10-CM | POA: Diagnosis not present

## 2023-11-03 DIAGNOSIS — I700 Atherosclerosis of aorta: Secondary | ICD-10-CM | POA: Diagnosis not present

## 2023-11-03 DIAGNOSIS — I10 Essential (primary) hypertension: Secondary | ICD-10-CM | POA: Diagnosis not present

## 2023-11-03 DIAGNOSIS — M0589 Other rheumatoid arthritis with rheumatoid factor of multiple sites: Secondary | ICD-10-CM | POA: Diagnosis not present

## 2023-11-03 DIAGNOSIS — E038 Other specified hypothyroidism: Secondary | ICD-10-CM | POA: Diagnosis not present

## 2023-11-21 ENCOUNTER — Ambulatory Visit: Attending: Cardiology | Admitting: Cardiology

## 2023-11-21 ENCOUNTER — Encounter: Payer: Self-pay | Admitting: Cardiology

## 2023-11-21 VITALS — BP 150/90 | HR 78 | Ht 67.0 in | Wt 155.6 lb

## 2023-11-21 DIAGNOSIS — I350 Nonrheumatic aortic (valve) stenosis: Secondary | ICD-10-CM

## 2023-11-21 DIAGNOSIS — I2510 Atherosclerotic heart disease of native coronary artery without angina pectoris: Secondary | ICD-10-CM | POA: Diagnosis not present

## 2023-11-21 DIAGNOSIS — I10 Essential (primary) hypertension: Secondary | ICD-10-CM

## 2023-11-21 DIAGNOSIS — R0609 Other forms of dyspnea: Secondary | ICD-10-CM | POA: Diagnosis not present

## 2023-11-21 MED ORDER — AMLODIPINE BESYLATE 5 MG PO TABS: 5.0000 mg | ORAL_TABLET | Freq: Every day | ORAL | 3 refills | Status: AC

## 2023-11-21 NOTE — Progress Notes (Signed)
 Clinical Summary [name] is a 78 y.o.male seen today as a new consult, referred by NP Arlana Pouch for the following medical problems.   1.Coronary atherosclerosis -10/2023 coronary calcium score: score 1960, 84ths percentile - EKG SR, no acute ischemic changes  -no recent chest pains. Has some SOB/DOE with activities, worsened over the last year. DOE just walking in from parking lot today.  - no cough, mild wheezing - no recent edema. - CAD risk factors: 10-15 year smoking history, quit in 1980s. HTN, HLD, DM2   2.Aortic stenosis -08/2022 echo: LVEF 60-65%, no WMAs, mean grad 14 AVA VTI 1.57 DI 0.45   3. HLD - 08/2022 TC 138 TG 188 HDL LDL 73 Jan 2025 TC 188 TG 161 HDL 50 LDL 101  4. HTN - compliant with meds Past Medical History:  Diagnosis Date   Anxiety and depression    Chronic renal insufficiency, stage 3 (moderate) (HCC)    DDD (degenerative disc disease), lumbar    remote hx of fusion surgery   Dysrhythmia    isolated afib/flutter   GERD (gastroesophageal reflux disease)    Gout    History of stomach ulcers 2000   Hyperlipidemia    Hypertension    NAUSEA WITH VOMITING 10/15/2010   Rheumatoid arthritis (HCC)    Rheumatoid      No Known Allergies   Current Outpatient Medications  Medication Sig Dispense Refill   acetaminophen (TYLENOL) 500 MG tablet Take 1 tablet (500 mg total) by mouth every 6 (six) hours as needed for mild pain or headache. 30 tablet 0   allopurinol (ZYLOPRIM) 300 MG tablet Take 300 mg by mouth daily.     donepezil (ARICEPT) 5 MG tablet Take 1 tablet (5 mg total) by mouth at bedtime. 30 tablet 2   escitalopram (LEXAPRO) 20 MG tablet Take 20 mg by mouth at bedtime.      etanercept (ENBREL SURECLICK) 50 MG/ML injection Inject into the skin.     folic acid (FOLVITE) 1 MG tablet Take 1 mg by mouth daily.     levothyroxine (SYNTHROID) 25 MCG tablet Take 12.5 mcg by mouth.     losartan (COZAAR) 100 MG tablet Take 100 mg by mouth daily.      Melatonin 10 MG TABS Take 10 mg by mouth at bedtime as needed.     omeprazole (PRILOSEC) 40 MG capsule Take 40 mg by mouth daily. 30 minutes before a meal     pantoprazole (PROTONIX) 40 MG tablet Take 40 mg by mouth at bedtime.     pravastatin (PRAVACHOL) 40 MG tablet Take 40 mg by mouth daily.     predniSONE (DELTASONE) 20 MG tablet Take 20 mg by mouth daily.     propranolol ER (INDERAL LA) 60 MG 24 hr capsule TAKE 1 CAPSULE DAILY-CALL & SCHEDULE APPOINTMENT FOR MARCH 2024 OR SOONER IF SYMPTOMS CHANGE 90 capsule 3   tiZANidine (ZANAFLEX) 2 MG tablet Take 2 mg by mouth 3 (three) times daily as needed.     traMADol (ULTRAM) 50 MG tablet Take 50 mg by mouth 3 (three) times daily as needed.     traZODone (DESYREL) 50 MG tablet Take 50 mg by mouth at bedtime as needed.     No current facility-administered medications for this visit.     Past Surgical History:  Procedure Laterality Date   APPENDECTOMY  1968   back fusion  2000   CHOLECYSTECTOMY N/A 06/06/2017   Procedure: LAPAROSCOPIC CHOLECYSTECTOMY WITH INTRAOPERATIVE CHOLANGIOGRAM;  Surgeon: Darnell Level, MD;  Location: [facility];  Service: General;  Laterality: N/A;   CRANIOTOMY N/A 10/20/2022   Procedure: TRANSSPHENOIDAL RESECTION OF PITUITARY TUMOR;  Surgeon: Tressie Stalker, MD;  Location: [facility] OR;  Service: Neurosurgery;  Laterality: N/A;   EYE SURGERY     Dr. Nile Riggs  lens implants   LUMBAR LAMINECTOMY  2000   NASAL SEPTOPLASTY W/ TURBINOPLASTY Bilateral 10/20/2022   Procedure: NASAL SEPTOPLASTY;  Surgeon: Laren Boom, DO;  Location: [facility] OR;  Service: ENT;  Laterality: Bilateral;   TIBIA FRACTURE SURGERY Left 1998   with titanium rods   TRANSPHENOIDAL APPROACH EXPOSURE Bilateral 10/20/2022   Procedure: TRANSPHENOIDAL APPROACH EXPOSURE;  Surgeon: Laren Boom, DO;  Location: [facility] OR;  Service: ENT;  Laterality: Bilateral;   UMBILICAL HERNIA REPAIR N/A 06/06/2017   Procedure: UMBILICAL HERNIA REPAIR;  Surgeon: Darnell Level, MD;   Location: [facility];  Service: General;  Laterality: N/A;     No Known Allergies    Family History  Problem Relation Age of Onset   Cancer Mother    CVA Mother    Cancer Maternal Grandmother    Healthy Son    Colon cancer Neg Hx      Social History Mr. Krach reports that he quit smoking about 45 years ago. His smoking use included cigarettes. He started smoking about 65 years ago. He has a 40 pack-year smoking history. He has never used smokeless tobacco. Mr. Tosi reports no history of alcohol use.   Physical Examination Today's Vitals   11/21/23 1301 11/21/23 1316  BP: (!) 182/96 (!) 182/104  Pulse: 78   SpO2: 98%   Weight: 155 lb 9.6 oz (70.6 kg)   Height: 5\' 7"  (1.702 m)    Body mass index is 24.37 kg/m.   Gen: resting comfortably, no acute distress HEENT: no scleral icterus, pupils equal round and reactive, no palptable cervical adenopathy,  CV: RRR, 2/6 systolic murmur rusb, no jvd Resp: Clear to auscultation bilaterally GI: abdomen is soft, non-tender, non-distended, normal bowel sounds, no hepatosplenomegaly MSK: extremities are warm, no edema.  Skin: warm, no rash Neuro:  no focal deficits Psych: appropriate affect   Assessment and Plan  1.DOE - mild AS by 2023 echo, we will repeat study - pending echo findings would plan for exercise nuclear stress test in setting of DOE, high coronary calcium score  2. Coronary atherosclerosis - high coronary calcium score - given symptoms of DOE plan for exercise nuclear stress most likely once echo is complete - start AS 81mg  daily, continue statin - EKG today SR, no acute ishcemic changes  3. HTN - above goal, add norvasc 5mg  daily. Has some CKD, will affect additional meds if needed  4. Aortic stenosis - mild AS by the reported parameters in 2023 echo though not in the conclusion, we will repeat echo      Antoine Poche, M.D.

## 2023-11-21 NOTE — Patient Instructions (Signed)
 Medication Instructions:  Your physician has recommended you make the following change in your medication:   -Start Aspirin 81 mg once daily  -Start Norvasc (Amlodipine) 5 mg once daily   *If you need a refill on your cardiac medications before your next appointment, please call your pharmacy*   Lab Work: None If you have labs (blood work) drawn today and your tests are completely normal, you will receive your results only by: MyChart Message (if you have MyChart) OR A paper copy in the mail If you have any lab test that is abnormal or we need to change your treatment, we will call you to review the results.   Testing/Procedures: Your physician has requested that you have an echocardiogram. Echocardiography is a painless test that uses sound waves to create images of your heart. It provides your doctor with information about the size and shape of your heart and how well your heart's chambers and valves are working. This procedure takes approximately one hour. There are no restrictions for this procedure. Please do NOT wear cologne, perfume, aftershave, or lotions (deodorant is allowed). Please arrive 15 minutes prior to your appointment time.  Please note: We ask at that you not bring children with you during ultrasound (echo/ vascular) testing. Due to room size and safety concerns, children are not allowed in the ultrasound rooms during exams. Our front office staff cannot provide observation of children in our lobby area while testing is being conducted. An adult accompanying a patient to their appointment will only be allowed in the ultrasound room at the discretion of the ultrasound technician under special circumstances. We apologize for any inconvenience.    Follow-Up: At [facility], you and your health needs are our priority.  As part of our continuing mission to provide you with exceptional heart care, we have created designated Provider Care Teams.  These Care Teams  include your primary Cardiologist (physician) and Advanced Practice Providers (APPs -  Physician Assistants and Nurse Practitioners) who all work together to provide you with the care you need, when you need it.  We recommend signing up for the patient portal called "MyChart".  Sign up information is provided on this After Visit Summary.  MyChart is used to connect with patients for Virtual Visits (Telemedicine).  Patients are able to view lab/test results, encounter notes, upcoming appointments, etc.  Non-urgent messages can be sent to your provider as well.   To learn more about what you can do with MyChart, go to [email or web address].    Your next appointment:   2 month(s)  Provider:   You may see Dina Rich, MD or one of the following Advanced Practice Providers on your designated Care Team:   Randall An, PA-C  Jacolyn Reedy, [place]     Other Instructions

## 2023-11-23 DIAGNOSIS — M549 Dorsalgia, unspecified: Secondary | ICD-10-CM | POA: Diagnosis not present

## 2023-11-23 DIAGNOSIS — E785 Hyperlipidemia, unspecified: Secondary | ICD-10-CM | POA: Diagnosis not present

## 2023-11-23 DIAGNOSIS — K219 Gastro-esophageal reflux disease without esophagitis: Secondary | ICD-10-CM | POA: Diagnosis not present

## 2023-11-23 DIAGNOSIS — M1A09X0 Idiopathic chronic gout, multiple sites, without tophus (tophi): Secondary | ICD-10-CM | POA: Diagnosis not present

## 2023-11-23 DIAGNOSIS — Z79899 Other long term (current) drug therapy: Secondary | ICD-10-CM | POA: Diagnosis not present

## 2023-11-23 DIAGNOSIS — N289 Disorder of kidney and ureter, unspecified: Secondary | ICD-10-CM | POA: Diagnosis not present

## 2023-11-23 DIAGNOSIS — M810 Age-related osteoporosis without current pathological fracture: Secondary | ICD-10-CM | POA: Diagnosis not present

## 2023-11-23 DIAGNOSIS — M0589 Other rheumatoid arthritis with rheumatoid factor of multiple sites: Secondary | ICD-10-CM | POA: Diagnosis not present

## 2023-11-23 DIAGNOSIS — I10 Essential (primary) hypertension: Secondary | ICD-10-CM | POA: Diagnosis not present

## 2023-11-23 DIAGNOSIS — M1990 Unspecified osteoarthritis, unspecified site: Secondary | ICD-10-CM | POA: Diagnosis not present

## 2023-12-04 ENCOUNTER — Other Ambulatory Visit: Payer: Self-pay | Admitting: Neurology

## 2023-12-04 DIAGNOSIS — H4710 Unspecified papilledema: Secondary | ICD-10-CM | POA: Diagnosis not present

## 2023-12-04 DIAGNOSIS — M0589 Other rheumatoid arthritis with rheumatoid factor of multiple sites: Secondary | ICD-10-CM | POA: Diagnosis not present

## 2023-12-04 DIAGNOSIS — H353113 Nonexudative age-related macular degeneration, right eye, advanced atrophic without subfoveal involvement: Secondary | ICD-10-CM | POA: Diagnosis not present

## 2023-12-04 DIAGNOSIS — H539 Unspecified visual disturbance: Secondary | ICD-10-CM | POA: Diagnosis not present

## 2023-12-04 DIAGNOSIS — H353122 Nonexudative age-related macular degeneration, left eye, intermediate dry stage: Secondary | ICD-10-CM | POA: Diagnosis not present

## 2023-12-04 DIAGNOSIS — M25512 Pain in left shoulder: Secondary | ICD-10-CM | POA: Diagnosis not present

## 2023-12-04 DIAGNOSIS — H538 Other visual disturbances: Secondary | ICD-10-CM | POA: Diagnosis not present

## 2023-12-04 DIAGNOSIS — H34811 Central retinal vein occlusion, right eye, with macular edema: Secondary | ICD-10-CM | POA: Diagnosis not present

## 2023-12-04 DIAGNOSIS — R4189 Other symptoms and signs involving cognitive functions and awareness: Secondary | ICD-10-CM

## 2023-12-05 DIAGNOSIS — H43811 Vitreous degeneration, right eye: Secondary | ICD-10-CM | POA: Diagnosis not present

## 2023-12-05 DIAGNOSIS — H43822 Vitreomacular adhesion, left eye: Secondary | ICD-10-CM | POA: Diagnosis not present

## 2023-12-05 DIAGNOSIS — H34811 Central retinal vein occlusion, right eye, with macular edema: Secondary | ICD-10-CM | POA: Diagnosis not present

## 2023-12-05 DIAGNOSIS — H4051X3 Glaucoma secondary to other eye disorders, right eye, severe stage: Secondary | ICD-10-CM | POA: Diagnosis not present

## 2023-12-05 DIAGNOSIS — H353132 Nonexudative age-related macular degeneration, bilateral, intermediate dry stage: Secondary | ICD-10-CM | POA: Diagnosis not present

## 2023-12-07 ENCOUNTER — Emergency Department (HOSPITAL_COMMUNITY)

## 2023-12-07 ENCOUNTER — Other Ambulatory Visit: Payer: Self-pay

## 2023-12-07 ENCOUNTER — Encounter (HOSPITAL_COMMUNITY): Payer: Self-pay

## 2023-12-07 ENCOUNTER — Emergency Department (HOSPITAL_COMMUNITY)
Admission: EM | Admit: 2023-12-07 | Discharge: 2023-12-07 | Disposition: A | Discharge status: Home / Self Care | Attending: Emergency Medicine | Admitting: Emergency Medicine

## 2023-12-07 ENCOUNTER — Ambulatory Visit (HOSPITAL_COMMUNITY): Admission: RE | Admit: 2023-12-07 | Source: Ambulatory Visit

## 2023-12-07 DIAGNOSIS — R0602 Shortness of breath: Secondary | ICD-10-CM | POA: Diagnosis not present

## 2023-12-07 DIAGNOSIS — I129 Hypertensive chronic kidney disease with stage 1 through stage 4 chronic kidney disease, or unspecified chronic kidney disease: Secondary | ICD-10-CM | POA: Diagnosis not present

## 2023-12-07 DIAGNOSIS — R6889 Other general symptoms and signs: Secondary | ICD-10-CM | POA: Diagnosis not present

## 2023-12-07 DIAGNOSIS — I700 Atherosclerosis of aorta: Secondary | ICD-10-CM | POA: Diagnosis not present

## 2023-12-07 DIAGNOSIS — I499 Cardiac arrhythmia, unspecified: Secondary | ICD-10-CM | POA: Diagnosis not present

## 2023-12-07 DIAGNOSIS — R112 Nausea with vomiting, unspecified: Secondary | ICD-10-CM | POA: Diagnosis not present

## 2023-12-07 DIAGNOSIS — J984 Other disorders of lung: Secondary | ICD-10-CM | POA: Diagnosis not present

## 2023-12-07 DIAGNOSIS — I7143 Infrarenal abdominal aortic aneurysm, without rupture: Secondary | ICD-10-CM | POA: Diagnosis not present

## 2023-12-07 DIAGNOSIS — Z87891 Personal history of nicotine dependence: Secondary | ICD-10-CM | POA: Diagnosis not present

## 2023-12-07 DIAGNOSIS — R109 Unspecified abdominal pain: Secondary | ICD-10-CM | POA: Diagnosis not present

## 2023-12-07 DIAGNOSIS — I6782 Cerebral ischemia: Secondary | ICD-10-CM | POA: Diagnosis not present

## 2023-12-07 DIAGNOSIS — R1084 Generalized abdominal pain: Secondary | ICD-10-CM | POA: Insufficient documentation

## 2023-12-07 DIAGNOSIS — Z7982 Long term (current) use of aspirin: Secondary | ICD-10-CM | POA: Diagnosis not present

## 2023-12-07 DIAGNOSIS — N281 Cyst of kidney, acquired: Secondary | ICD-10-CM | POA: Diagnosis not present

## 2023-12-07 DIAGNOSIS — R519 Headache, unspecified: Secondary | ICD-10-CM | POA: Diagnosis not present

## 2023-12-07 DIAGNOSIS — Z743 Need for continuous supervision: Secondary | ICD-10-CM | POA: Diagnosis not present

## 2023-12-07 DIAGNOSIS — N183 Chronic kidney disease, stage 3 unspecified: Secondary | ICD-10-CM | POA: Insufficient documentation

## 2023-12-07 DIAGNOSIS — R0902 Hypoxemia: Secondary | ICD-10-CM | POA: Diagnosis not present

## 2023-12-07 DIAGNOSIS — J9811 Atelectasis: Secondary | ICD-10-CM | POA: Diagnosis not present

## 2023-12-07 DIAGNOSIS — K5730 Diverticulosis of large intestine without perforation or abscess without bleeding: Secondary | ICD-10-CM | POA: Diagnosis not present

## 2023-12-07 LAB — TROPONIN I (HIGH SENSITIVITY)
Troponin I (High Sensitivity): 8 ng/L (ref <18)
Troponin I (High Sensitivity): 8 ng/L (ref <18)

## 2023-12-07 LAB — CBC WITH DIFFERENTIAL/PLATELET
Abs Immature Granulocytes: 0.05 10*3/uL (ref 0.00–0.07)
Basophils Absolute: 0 10*3/uL (ref 0.0–0.1)
Basophils Relative: 0 %
Eosinophils Absolute: 0 10*3/uL (ref 0.0–0.5)
Eosinophils Relative: 0 %
HCT: 34.3 % — ABNORMAL LOW (ref 39.0–52.0)
Hemoglobin: 10.4 g/dL — ABNORMAL LOW (ref 13.0–17.0)
Immature Granulocytes: 0 %
Lymphocytes Relative: 15 %
Lymphs Abs: 1.8 10*3/uL (ref 0.7–4.0)
MCH: 27 pg (ref 26.0–34.0)
MCHC: 30.3 g/dL (ref 30.0–36.0)
MCV: 89.1 fL (ref 80.0–100.0)
Monocytes Absolute: 0.4 10*3/uL (ref 0.1–1.0)
Monocytes Relative: 3 %
Neutro Abs: 10 10*3/uL — ABNORMAL HIGH (ref 1.7–7.7)
Neutrophils Relative %: 82 %
Platelets: 324 10*3/uL (ref 150–400)
RBC: 3.85 MIL/uL — ABNORMAL LOW (ref 4.22–5.81)
RDW: 16 % — ABNORMAL HIGH (ref 11.5–15.5)
WBC: 12.3 10*3/uL — ABNORMAL HIGH (ref 4.0–10.5)
nRBC: 0 % (ref 0.0–0.2)

## 2023-12-07 LAB — COMPREHENSIVE METABOLIC PANEL WITH GFR
ALT: 24 U/L (ref 0–44)
AST: 21 U/L (ref 15–41)
Albumin: 3.2 g/dL — ABNORMAL LOW (ref 3.5–5.0)
Alkaline Phosphatase: 58 U/L (ref 38–126)
Anion gap: 8 (ref 5–15)
BUN: 20 mg/dL (ref 8–23)
CO2: 26 mmol/L (ref 22–32)
Calcium: 8.6 mg/dL — ABNORMAL LOW (ref 8.9–10.3)
Chloride: 102 mmol/L (ref 98–111)
Creatinine, Ser: 1.47 mg/dL — ABNORMAL HIGH (ref 0.61–1.24)
GFR, Estimated: 48 mL/min — ABNORMAL LOW (ref >60)
Glucose, Bld: 124 mg/dL — ABNORMAL HIGH (ref 70–99)
Potassium: 4.2 mmol/L (ref 3.5–5.1)
Sodium: 136 mmol/L (ref 135–145)
Total Bilirubin: 0.5 mg/dL (ref 0.0–1.2)
Total Protein: 6.4 g/dL — ABNORMAL LOW (ref 6.5–8.1)

## 2023-12-07 LAB — URINALYSIS, W/ REFLEX TO CULTURE (INFECTION SUSPECTED)
Bilirubin Urine: NEGATIVE
Glucose, UA: NEGATIVE mg/dL
Hgb urine dipstick: NEGATIVE
Ketones, ur: NEGATIVE mg/dL
Leukocytes,Ua: NEGATIVE
Nitrite: NEGATIVE
Protein, ur: 100 mg/dL — AB
Specific Gravity, Urine: 1.018 (ref 1.005–1.030)
pH: 7 (ref 5.0–8.0)

## 2023-12-07 LAB — BRAIN NATRIURETIC PEPTIDE: B Natriuretic Peptide: 48 pg/mL (ref 0.0–100.0)

## 2023-12-07 LAB — LIPASE, BLOOD: Lipase: 57 U/L — ABNORMAL HIGH (ref 11–51)

## 2023-12-07 MED ORDER — ONDANSETRON 4 MG PO TBDP: 4.0000 mg | ORAL_TABLET | Freq: Three times a day (TID) | ORAL | 0 refills | Status: AC | PRN

## 2023-12-07 MED ORDER — HYDROMORPHONE HCL 1 MG/ML IJ SOLN
0.5000 mg | Freq: Once | INTRAMUSCULAR | Status: AC
  Administered 2023-12-07: 0.5 mg via INTRAVENOUS
  Filled 2023-12-07: qty 0.5

## 2023-12-07 MED ORDER — ONDANSETRON HCL 4 MG/2ML IJ SOLN
4.0000 mg | Freq: Once | INTRAMUSCULAR | Status: AC
  Administered 2023-12-07: 4 mg via INTRAVENOUS
  Filled 2023-12-07: qty 2

## 2023-12-07 MED ORDER — SODIUM CHLORIDE 0.9 % IV BOLUS
1000.0000 mL | Freq: Once | INTRAVENOUS | Status: AC
  Administered 2023-12-07: 1000 mL via INTRAVENOUS

## 2023-12-07 MED ORDER — ALUM & MAG HYDROXIDE-SIMETH 200-200-20 MG/5ML PO SUSP
30.0000 mL | Freq: Once | ORAL | Status: AC
  Administered 2023-12-07: 30 mL via ORAL
  Filled 2023-12-07: qty 30

## 2023-12-07 MED ORDER — IOHEXOL 300 MG/ML  SOLN
100.0000 mL | Freq: Once | INTRAMUSCULAR | Status: AC | PRN
  Administered 2023-12-07: 100 mL via INTRAVENOUS

## 2023-12-07 MED ORDER — OXYCODONE HCL 5 MG PO TABS: 5.0000 mg | ORAL_TABLET | ORAL | 0 refills | Status: DC | PRN
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
## 2023-12-07 NOTE — Discharge Instructions (Addendum)
 We evaluated you for your abdominal pain.  Your testing in the emergency department is reassuring.  We did not identify the cause of your abdominal pain, but since your testing is reassuring and you have been able to eat food in the emergency department, we think it is safe to go home.  We have prescribed you some pain medication and nausea medication to take at home.  Do not take oxycodone with your home tramadol.  Please follow-up closely with your primary doctor.  Please follow-up in the emergency department if you have any new or worsening symptoms such as recurrent or worsening pain, uncontrolled vomiting, fevers or chills, bloody stool, chest pain, difficulty breathing, or any other new symptoms.   We did notice that you had an aortic aneurysm on your CT scan.  We do not think this is related to your pain.  The radiologist recommends having an ultrasound every year to make sure it is not growing.

## 2023-12-07 NOTE — ED Notes (Signed)
 Patient returned from CT abd/pel with continued abd pain and nausea. MD notified.

## 2023-12-07 NOTE — ED Provider Notes (Signed)
 [place] EMERGENCY DEPARTMENT AT [facility] Provider Note  CSN: [id number] Arrival date & time: 12/07/23 1356  Chief Complaint(s) Abdominal Pain (Headache), Migraine, and Shortness of Breath (N/V x 1 week, No PO tolerance for 2 days)  HPI [name] is a 78 y.o. male history of CKD, hypertension, hyperlipidemia, rheumatoid arthritis presenting to the emergency department with abdominal pain.  Patient reports abdominal pain for the past week, associated with nausea, vomiting.  Reports decreased p.o. intake.  He reports mild headache.  No diarrhea.  Reports that he has not had as many regular bowel movements but is still passing gas.  No fevers or chills.  He also reports that he has been having some chronic shortness of breath.  Reports that this been going on for some time but not significantly different than baseline.  Reports dyspnea with exertion.  No chest pain.  No leg swelling.  No recent travel or surgeries.  No other symptoms.  He also reports that recently he had an injection in his eye as he was told he had bleeding on the back of his eye.  Reports that he had some visual disturbances.  Reports that overall his symptoms as far as this goes have improved and his vision is improving.  No pain.   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   Encephalopathy 09/01/2022   Malnutrition of moderate  degree 01/11/2022   Acute metabolic encephalopathy 01/11/2022   Acute diverticulitis 01/09/2022   Acute renal failure superimposed on stage 3b chronic kidney disease (HCC) 01/09/2022   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   Chronic daily headache 01/03/2022   Altered mental status 12/27/2021   Headache 12/27/2021   Periorbital pain, bilateral 12/27/2021   Anxiety and depression 12/27/2021   Chronic gouty arthritis 04/23/2021   Chronic pain 04/23/2021   Essential hypertension 04/23/2021   Fatigue 04/23/2021   Hyperlipidemia 04/23/2021   Insomnia 04/23/2021   Rheumatoid arthritis (HCC) 04/23/2021   Intermediate stage nonexudative age-related macular degeneration of both eyes 07/22/2020   Posterior vitreous detachment of right eye 07/22/2020   Vitreomacular adhesion of left eye 07/22/2020   Cholelithiasis with chronic cholecystitis 06/05/2017   Umbilical hernia 06/05/2017   Cholelithiasis 05/09/2017   GERD 10/15/2010   NAUSEA WITH VOMITING 10/15/2010   COLONIC POLYPS 11/25/2008   Gastric ulcer 11/25/2008   Duodenal ulcer without hemorrhage or perforation and without obstruction 11/25/2008   Diverticulosis of colon 11/25/2008   Rheumatoid arthritis (HCC) 11/25/2008   NAUSEA 11/25/2008   ABDOMINAL PAIN-EPIGASTRIC 11/25/2008   ABDOMINAL PAIN -GENERALIZED 11/25/2008   Home Medication(s) Prior to Admission medications   Medication Sig Start Date End Date Taking? Authorizing Provider  ondansetron (ZOFRAN-ODT) 4 MG disintegrating tablet Take 1 tablet (4 mg total) by mouth every 8 (eight) hours as needed for nausea or vomiting. 12/07/23  Yes Lonell Grandchild, MD  oxyCODONE (ROXICODONE) 5 MG immediate release tablet Take 1 tablet (5 mg total) by mouth every 4 (four) hours as needed for severe pain (pain score 7-10). 12/07/23  Yes Lonell Grandchild, MD  acetaminophen (TYLENOL) 500 MG tablet Take 1 tablet (500 mg total) by mouth every 6 (six) hours as needed for mild pain or headache.  12/28/21   Marguerita Merles Latif, DO  allopurinol (ZYLOPRIM) 300 MG tablet Take 300 mg by mouth daily.    [provider]  amLODipine (NORVASC) 5 MG tablet Take 1 tablet (5 mg total) by mouth daily. 11/21/23 11/15/24  Antoine Poche, MD  aspirin EC 81 MG tablet Take 81 mg by mouth daily. Swallow whole.    [provider]  donepezil (ARICEPT) 5 MG tablet Take 1 tablet (5 mg total) by mouth at bedtime. 10/17/23   Anson Fret, MD  escitalopram (LEXAPRO) 20 MG tablet Take 20 mg by mouth at bedtime.     [provider]  etanercept (ENBREL SURECLICK) 50 MG/ML injection Inject 50 mg into the skin once a week.    [provider]  folic acid (FOLVITE) 1 MG tablet Take 1 mg by mouth daily.    [provider]  levothyroxine (SYNTHROID) 25 MCG tablet Take 12.5 mcg by mouth.    [provider]  losartan (COZAAR) 100 MG tablet Take 100 mg by mouth daily.    [provider]  Melatonin 10 MG TABS Take 10 mg by mouth at bedtime as needed.    [provider]  omeprazole (PRILOSEC) 40 MG capsule Take 40 mg by mouth daily. 30 minutes before a meal    [provider]  pravastatin (PRAVACHOL) 40 MG tablet Take 40 mg by mouth daily. 04/15/21   [provider]  predniSONE (DELTASONE) 20 MG tablet Take 20 mg by mouth daily. 06/09/23   [provider]  propranolol ER (INDERAL LA) 60 MG 24 hr capsule TAKE 1 CAPSULE DAILY-CALL & SCHEDULE APPOINTMENT FOR MARCH 2024 OR SOONER IF SYMPTOMS CHANGE 07/06/23   Ihor Austin, NP  tiZANidine (ZANAFLEX) 2 MG tablet Take 2 mg by mouth 3 (three) times daily as needed. 05/23/23   [provider]  traMADol (ULTRAM) 50 MG tablet Take 50 mg by mouth 3 (three) times daily as needed. 04/04/23   [provider]  traZODone (DESYREL) 50 MG tablet Take 50 mg by mouth at bedtime as needed. 02/13/21   [provider]                                                                                                                                     Past Surgical History Past Surgical History:  Procedure Laterality Date   APPENDECTOMY  1968   back fusion  2000   CHOLECYSTECTOMY N/A 06/06/2017   Procedure: LAPAROSCOPIC CHOLECYSTECTOMY WITH INTRAOPERATIVE CHOLANGIOGRAM;  Surgeon: Darnell Level, MD;  Location: [facility];  Service:  General;  Laterality: N/A;   CRANIOTOMY N/A 10/20/2022   Procedure: TRANSSPHENOIDAL RESECTION OF PITUITARY TUMOR;  Surgeon: Tressie Stalker, MD;  Location: [facility] OR;  Service: Neurosurgery;  Laterality: N/A;   EYE SURGERY     Dr. Nile Riggs  lens implants   LUMBAR LAMINECTOMY  2000   NASAL SEPTOPLASTY W/ TURBINOPLASTY Bilateral 10/20/2022   Procedure: NASAL SEPTOPLASTY;  Surgeon: Laren Boom, DO;  Location: [facility] OR;  Service: ENT;  Laterality: Bilateral;   TIBIA FRACTURE SURGERY Left 1998   with titanium rods   TRANSPHENOIDAL APPROACH EXPOSURE Bilateral 10/20/2022   Procedure: TRANSPHENOIDAL APPROACH EXPOSURE;  Surgeon: Laren Boom, DO;  Location: [facility] OR;  Service: ENT;  Laterality: Bilateral;   UMBILICAL HERNIA REPAIR N/A 06/06/2017   Procedure: UMBILICAL HERNIA REPAIR;  Surgeon: Darnell Level, MD;  Location: [facility];  Service: General;  Laterality: N/A;   Family History Family History  Problem Relation Age of Onset   Cancer Mother    CVA Mother    Cancer Maternal Grandmother    Healthy Son    Colon cancer Neg Hx     Social History Social History   Tobacco Use   Smoking status: Former    Current packs/day: 0.00    Average packs/day: 2.0 packs/day for 20.0 years (40.0 ttl pk-yrs)    Types: Cigarettes    Start date: 16    Quit date: 1980    Years since quitting: 45.2   Smokeless tobacco: Never   Tobacco comments:    Stopped early 32s  Vaping Use   Vaping status: Never Used  Substance Use Topics   Alcohol use: No   Drug use: No   Allergies Patient has no known allergies.  Review of Systems Review of Systems  All  other systems reviewed and are negative.   Physical Exam Vital Signs  I have reviewed the triage vital signs BP (!) 147/83   Pulse 70   Temp (!) 97.5 F (36.4 C) (Oral)   Resp 13   Ht 5\' 7"  (1.702 m)   Wt 69.4 kg   SpO2 94%   BMI 23.96 kg/m  Physical Exam Vitals and nursing note reviewed.  Constitutional:      General: He is not in acute distress.    Appearance: Normal appearance.  HENT:     Mouth/Throat:     Mouth: Mucous membranes are moist.  Eyes:     Conjunctiva/sclera: Conjunctivae normal.  Cardiovascular:     Rate and Rhythm: Normal rate and regular rhythm.  Pulmonary:     Effort: Pulmonary effort is normal. No respiratory distress.     Breath sounds: Normal breath sounds.  Abdominal:     General: Abdomen is flat.     Palpations: Abdomen is soft.     Tenderness: There is generalized abdominal tenderness.  Musculoskeletal:     Right lower leg: No edema.     Left lower leg: No edema.  Skin:    General: Skin is warm and dry.     Capillary Refill: Capillary refill takes less than 2 seconds.  Neurological:     General: No focal deficit present.     Mental Status: He is alert and oriented to person, place, and time. Mental status is at baseline.  Psychiatric:        Mood and Affect: Mood normal.        Behavior: Behavior normal.     ED Results and Treatments Labs (all labs ordered are listed, but only abnormal  results are displayed) Labs Reviewed  COMPREHENSIVE METABOLIC PANEL WITH GFR - Abnormal; Notable for the following components:      Result Value   Glucose, Bld 124 (*)    Creatinine, Ser 1.47 (*)    Calcium 8.6 (*)    Total Protein 6.4 (*)    Albumin 3.2 (*)    GFR, Estimated 48 (*)    All other components within normal limits  CBC WITH DIFFERENTIAL/PLATELET - Abnormal; Notable for the following components:   WBC 12.3 (*)    RBC 3.85 (*)    Hemoglobin 10.4 (*)    HCT 34.3 (*)    RDW 16.0 (*)    Neutro Abs 10.0 (*)    All other components  within normal limits  LIPASE, BLOOD - Abnormal; Notable for the following components:   Lipase 57 (*)    All other components within normal limits  URINALYSIS, W/ REFLEX TO CULTURE (INFECTION SUSPECTED) - Abnormal; Notable for the following components:   APPearance HAZY (*)    Protein, ur 100 (*)    Bacteria, UA RARE (*)    All other components within normal limits  BRAIN NATRIURETIC PEPTIDE  TROPONIN I (HIGH SENSITIVITY)  TROPONIN I (HIGH SENSITIVITY)                                                                                                                          Radiology CT ABDOMEN PELVIS W CONTRAST Result Date: 12/07/2023 CLINICAL DATA:  Acute generalized abdominal pain.  Malaise. EXAM: CT ABDOMEN AND PELVIS WITH CONTRAST TECHNIQUE: Multidetector CT imaging of the abdomen and pelvis was performed using the standard protocol following bolus administration of intravenous contrast. RADIATION DOSE REDUCTION: This exam was performed according to the departmental dose-optimization program which includes automated exposure control, adjustment of the mA and/or kV according to patient size and/or use of iterative reconstruction technique. CONTRAST:  OMNIPAQUE IOHEXOL 300 MG/ML  SOLN COMPARISON:  August 26, 2022. FINDINGS: Lower chest: No acute abnormality. Hepatobiliary: No focal liver abnormality is seen. Status post cholecystectomy. No biliary dilatation. Pancreas: Unremarkable. No pancreatic ductal dilatation or surrounding inflammatory changes. Spleen: Normal in size without focal abnormality. Adrenals/Urinary Tract: Adrenal glands appear normal. Bilateral renal cysts are noted for which no further follow-up is required. No hydronephrosis or renal obstruction is noted. Urinary bladder is unremarkable. Stomach/Bowel: Stomach is unremarkable. There is no evidence of bowel obstruction or inflammation. Status post appendectomy. Sigmoid diverticulosis without inflammation. Vascular/Lymphatic:  Aortic atherosclerosis. 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 No enlarged abdominal or pelvic lymph nodes. Reproductive: Prostate is unremarkable. Other: No abdominal wall hernia or abnormality. No abdominopelvic ascites. Musculoskeletal: No acute or significant osseous findings.1 IMPRESSION: 3 cm infrarenal abdominal aortic aneurysm. Recommend follow-up ultrasound every 3 years. (Ref.: J Vasc Surg. 2018; 67:2-77 and J Am Coll Radiol 2013;10(10):789-794.) Sigmoid diverticulosis without inflammation. Aortic Atherosclerosis (ICD10-I70.0). Electronically Signed   By: Lupita Raider M.D.   On: 12/07/2023 18:58  CT Head Wo Contrast Result Date: 12/07/2023 CLINICAL DATA:  Headache. EXAM: CT HEAD WITHOUT CONTRAST TECHNIQUE: Contiguous axial images were obtained from the base of the skull through the vertex without intravenous contrast. RADIATION DOSE REDUCTION: This exam was performed according to the departmental dose-optimization program which includes automated exposure control, adjustment of the mA and/or kV according to patient size and/or use of iterative reconstruction technique. COMPARISON:  Head CT dated 08/27/2022. FINDINGS: Brain: Moderate age-related atrophy and chronic microvascular ischemic changes. There is no acute intracranial hemorrhage. No mass effect or midline shift. No extra-axial fluid collection. Vascular: No hyperdense vessel or unexpected calcification. Skull: Normal. Negative for fracture or focal lesion. Sinuses/Orbits: Diffuse mucoperiosteal thickening and opacification of paranasal sinuses. The mastoid air cells are clear. Other: None IMPRESSION: 1. No acute intracranial pathology. 2. Moderate age-related atrophy and chronic microvascular ischemic changes. 3. Paranasal sinus disease. Electronically Signed   By: Elgie Collard M.D.   On: 12/07/2023 17:30   DG Chest Port 1 View Result Date: 12/07/2023 CLINICAL DATA:  Shortness of breath. EXAM: PORTABLE CHEST 1 VIEW  COMPARISON:  Chest radiograph dated 08/28/2022. FINDINGS: The heart size and mediastinal contours are within normal limits. Aortic atherosclerosis. Linear scarring/atelectasis at the left lung base. No focal consolidation, pleural effusion, or pneumothorax. No acute osseous abnormality. IMPRESSION: Left basilar linear scarring/atelectasis. Otherwise, no acute cardiopulmonary findings. Electronically Signed   By: Hart Robinsons M.D.   On: 12/07/2023 16:25    Pertinent labs & imaging results that were available during my care of the patient were reviewed by me and considered in my medical decision making (see MDM for details).  Medications Ordered in ED Medications  ondansetron (ZOFRAN) injection 4 mg (4 mg Intravenous Given 12/07/23 1553)  sodium chloride 0.9 % bolus 1,000 mL (0 mLs Intravenous Stopped 12/07/23 1725)  HYDROmorphone (DILAUDID) injection 0.5 mg (0.5 mg Intravenous Given 12/07/23 1553)  iohexol (OMNIPAQUE) 300 MG/ML solution 100 mL (100 mLs Intravenous Contrast Given 12/07/23 1822)  HYDROmorphone (DILAUDID) injection 0.5 mg (0.5 mg Intravenous Given 12/07/23 1859)  ondansetron (ZOFRAN) injection 4 mg (4 mg Intravenous Given 12/07/23 1859)  alum & mag hydroxide-simeth (MAALOX/MYLANTA) 200-200-20 MG/5ML suspension 30 mL (30 mLs Oral Given 12/07/23 1924)                                                                                                                                     Procedures Procedures  (including critical care time)  Medical Decision Making / ED Course   MDM:  78 year old presenting to the emergency department with abdominal pain.  Patient overall well-appearing, exam with mild diffuse abdominal tenderness, no focal tenderness..  Vitals with no tachycardia, no fever.  Unclear cause of abdominal pain, given age, will obtain CT scan to further evaluate.  Will check labs including lipase, CMP.  Differential includes obstruction, perforation, abscess, volvulus,  appendicitis, pancreatitis, cholecystitis, urinary pathology, other process.  Seems less consistent  with vascular process such as dissection or mesenteric ischemia.  Will reassess.  Patient also reports mild headache.  Suspect this is possibly related to his nausea and vomiting but given his age will obtain CT head.  He has a nonfocal neurologic exam and is oriented.  Patient also reports shortness of breath.  He has clear lungs on exam.  He has no lower extremity swelling to suggest CHF.  He is no JVD.  He is not tachycardic.  Unclear cause.  He reports this is somewhat chronic.  Will check chest x-ray, troponin, BNP, EKG, reassess although seems not to be patient's primary complaint is more of a chronic problem.  He has no respiratory distress on exam.  Clinical Course as of 12/07/23 2005  Thu Dec 07, 2023  1959 Workup overall is reassuring.  The patient reports that he feels better.  He was able to tolerate p.o. without difficulty and request discharge.  No sign of UTI, intra-abdominal process, chest x-ray clear, BNP normal, troponin negative x 2.  Unclear cause of his nausea and vomiting but will discharge with some pain medication and nausea medication.  Given unclear cause of symptoms, recommended strict return precautions. Will discharge patient to home. All questions answered. Patient comfortable with plan of discharge. Return precautions discussed with patient and specified on the after visit summary.  [WS]    Clinical Course User Index [WS] Lonell Grandchild, MD     Additional history obtained: -Additional history obtained from ems -External records from outside source obtained and reviewed including: Chart review including previous notes, labs, imaging, consultation notes including prior notes    Lab Tests: -I ordered, reviewed, and interpreted labs.   The pertinent results include:   Labs Reviewed  COMPREHENSIVE METABOLIC PANEL WITH GFR - Abnormal; Notable for the following  components:      Result Value   Glucose, Bld 124 (*)    Creatinine, Ser 1.47 (*)    Calcium 8.6 (*)    Total Protein 6.4 (*)    Albumin 3.2 (*)    GFR, Estimated 48 (*)    All other components within normal limits  CBC WITH DIFFERENTIAL/PLATELET - Abnormal; Notable for the following components:   WBC 12.3 (*)    RBC 3.85 (*)    Hemoglobin 10.4 (*)    HCT 34.3 (*)    RDW 16.0 (*)    Neutro Abs 10.0 (*)    All other components within normal limits  LIPASE, BLOOD - Abnormal; Notable for the following components:   Lipase 57 (*)    All other components within normal limits  URINALYSIS, W/ REFLEX TO CULTURE (INFECTION SUSPECTED) - Abnormal; Notable for the following components:   APPearance HAZY (*)    Protein, ur 100 (*)    Bacteria, UA RARE (*)    All other components within normal limits  BRAIN NATRIURETIC PEPTIDE  TROPONIN I (HIGH SENSITIVITY)  TROPONIN I (HIGH SENSITIVITY)    Notable for see MDM   EKG   EKG Interpretation Date/Time:  Thursday December 07 2023 15:58:06 EDT Ventricular Rate:  60 PR Interval:  150 QRS Duration:  106 QT Interval:  421 QTC Calculation: 421 R Axis:   -36  Text Interpretation: Sinus rhythm Left axis deviation Abnormal R-wave progression, early transition Confirmed by Alvino Blood ([id number]) on 12/07/2023 4:05:49 PM         Imaging Studies ordered: I ordered imaging studies including CT head, CXR, CT abdomen On my interpretation imaging demonstrates no acute  process I independently visualized and interpreted imaging. I agree with the radiologist interpretation   Medicines ordered and prescription drug management: Meds ordered this encounter  Medications   ondansetron (ZOFRAN) injection 4 mg   sodium chloride 0.9 % bolus 1,000 mL   HYDROmorphone (DILAUDID) injection 0.5 mg   iohexol (OMNIPAQUE) 300 MG/ML solution 100 mL   HYDROmorphone (DILAUDID) injection 0.5 mg   ondansetron (ZOFRAN) injection 4 mg   alum & mag hydroxide-simeth  (MAALOX/MYLANTA) 200-200-20 MG/5ML suspension 30 mL   oxyCODONE (ROXICODONE) 5 MG immediate release tablet    Sig: Take 1 tablet (5 mg total) by mouth every 4 (four) hours as needed for severe pain (pain score 7-10).    Dispense:  12 tablet    Refill:  0   ondansetron (ZOFRAN-ODT) 4 MG disintegrating tablet    Sig: Take 1 tablet (4 mg total) by mouth every 8 (eight) hours as needed for nausea or vomiting.    Dispense:  12 tablet    Refill:  0    -I have reviewed the patients home medicines and have made adjustments as needed   Reevaluation: After the interventions noted above, I reevaluated the patient and found that their symptoms have improved  Co morbidities that complicate the patient evaluation  Past Medical History:  Diagnosis Date   Anxiety and depression    Chronic renal insufficiency, stage 3 (moderate) (HCC)    DDD (degenerative disc disease), lumbar    remote hx of fusion surgery   Dysrhythmia    isolated afib/flutter   GERD (gastroesophageal reflux disease)    Gout    History of stomach ulcers 2000   Hyperlipidemia    Hypertension    NAUSEA WITH VOMITING 10/15/2010   Rheumatoid arthritis (HCC)    Rheumatoid       Dispostion: Disposition decision including need for hospitalization was considered, and patient discharged from emergency department.    Final Clinical Impression(s) / ED Diagnoses Final diagnoses:  Generalized abdominal pain     This chart was dictated using voice recognition software.  Despite best efforts to proofread,  errors can occur which can change the documentation meaning.    Lonell Grandchild, MD 12/07/23 2005

## 2023-12-13 ENCOUNTER — Encounter: Payer: Self-pay | Admitting: Cardiology

## 2023-12-19 DIAGNOSIS — M67912 Unspecified disorder of synovium and tendon, left shoulder: Secondary | ICD-10-CM | POA: Diagnosis not present

## 2023-12-27 ENCOUNTER — Ambulatory Visit: Attending: Cardiology

## 2023-12-27 ENCOUNTER — Other Ambulatory Visit: Payer: Self-pay | Admitting: Neurology

## 2023-12-27 DIAGNOSIS — I350 Nonrheumatic aortic (valve) stenosis: Secondary | ICD-10-CM

## 2023-12-27 DIAGNOSIS — R4189 Other symptoms and signs involving cognitive functions and awareness: Secondary | ICD-10-CM

## 2023-12-27 LAB — ECHOCARDIOGRAM COMPLETE
AR max vel: 1.62 cm2
AV Area VTI: 1.52 cm2
AV Area mean vel: 1.55 cm2
AV Mean grad: 15 mmHg
AV Peak grad: 29.1 mmHg
Ao pk vel: 2.7 m/s
Area-P 1/2: 3.66 cm2
Calc EF: 58.9 %
MV VTI: 2.49 cm2
S' Lateral: 2.7 cm
Single Plane A2C EF: 61.1 %
Single Plane A4C EF: 55.8 %

## 2023-12-28 ENCOUNTER — Encounter: Payer: Self-pay | Admitting: Cardiology

## 2023-12-31 ENCOUNTER — Other Ambulatory Visit: Payer: Self-pay

## 2023-12-31 ENCOUNTER — Emergency Department (HOSPITAL_COMMUNITY)

## 2023-12-31 ENCOUNTER — Emergency Department (HOSPITAL_COMMUNITY)
Admission: EM | Admit: 2023-12-31 | Discharge: 2023-12-31 | Disposition: A | Discharge status: Home / Self Care | Attending: Emergency Medicine | Admitting: Emergency Medicine

## 2023-12-31 ENCOUNTER — Encounter (HOSPITAL_COMMUNITY): Payer: Self-pay | Admitting: Emergency Medicine

## 2023-12-31 DIAGNOSIS — N189 Chronic kidney disease, unspecified: Secondary | ICD-10-CM | POA: Insufficient documentation

## 2023-12-31 DIAGNOSIS — I129 Hypertensive chronic kidney disease with stage 1 through stage 4 chronic kidney disease, or unspecified chronic kidney disease: Secondary | ICD-10-CM | POA: Insufficient documentation

## 2023-12-31 DIAGNOSIS — K5732 Diverticulitis of large intestine without perforation or abscess without bleeding: Secondary | ICD-10-CM | POA: Diagnosis not present

## 2023-12-31 DIAGNOSIS — R911 Solitary pulmonary nodule: Secondary | ICD-10-CM | POA: Diagnosis not present

## 2023-12-31 DIAGNOSIS — R0689 Other abnormalities of breathing: Secondary | ICD-10-CM | POA: Diagnosis not present

## 2023-12-31 DIAGNOSIS — K5792 Diverticulitis of intestine, part unspecified, without perforation or abscess without bleeding: Secondary | ICD-10-CM

## 2023-12-31 DIAGNOSIS — D72829 Elevated white blood cell count, unspecified: Secondary | ICD-10-CM | POA: Diagnosis not present

## 2023-12-31 DIAGNOSIS — R6889 Other general symptoms and signs: Secondary | ICD-10-CM | POA: Diagnosis not present

## 2023-12-31 DIAGNOSIS — R109 Unspecified abdominal pain: Secondary | ICD-10-CM | POA: Diagnosis not present

## 2023-12-31 DIAGNOSIS — R0682 Tachypnea, not elsewhere classified: Secondary | ICD-10-CM | POA: Diagnosis not present

## 2023-12-31 DIAGNOSIS — Z7982 Long term (current) use of aspirin: Secondary | ICD-10-CM | POA: Insufficient documentation

## 2023-12-31 DIAGNOSIS — N281 Cyst of kidney, acquired: Secondary | ICD-10-CM | POA: Diagnosis not present

## 2023-12-31 DIAGNOSIS — Z79899 Other long term (current) drug therapy: Secondary | ICD-10-CM | POA: Insufficient documentation

## 2023-12-31 DIAGNOSIS — Z743 Need for continuous supervision: Secondary | ICD-10-CM | POA: Diagnosis not present

## 2023-12-31 LAB — CBC WITH DIFFERENTIAL/PLATELET
Abs Immature Granulocytes: 0.06 10*3/uL (ref 0.00–0.07)
Basophils Absolute: 0 10*3/uL (ref 0.0–0.1)
Basophils Relative: 0 %
Eosinophils Absolute: 0 10*3/uL (ref 0.0–0.5)
Eosinophils Relative: 0 %
HCT: 29.5 % — ABNORMAL LOW (ref 39.0–52.0)
Hemoglobin: 9.1 g/dL — ABNORMAL LOW (ref 13.0–17.0)
Immature Granulocytes: 1 %
Lymphocytes Relative: 20 %
Lymphs Abs: 2.4 10*3/uL (ref 0.7–4.0)
MCH: 26.5 pg (ref 26.0–34.0)
MCHC: 30.8 g/dL (ref 30.0–36.0)
MCV: 85.8 fL (ref 80.0–100.0)
Monocytes Absolute: 0.5 10*3/uL (ref 0.1–1.0)
Monocytes Relative: 4 %
Neutro Abs: 8.7 10*3/uL — ABNORMAL HIGH (ref 1.7–7.7)
Neutrophils Relative %: 75 %
Platelets: 266 10*3/uL (ref 150–400)
RBC: 3.44 MIL/uL — ABNORMAL LOW (ref 4.22–5.81)
RDW: 15.5 % (ref 11.5–15.5)
WBC: 11.6 10*3/uL — ABNORMAL HIGH (ref 4.0–10.5)
nRBC: 0 % (ref 0.0–0.2)

## 2023-12-31 LAB — COMPREHENSIVE METABOLIC PANEL WITH GFR
ALT: 18 U/L (ref 0–44)
AST: 19 U/L (ref 15–41)
Albumin: 2.9 g/dL — ABNORMAL LOW (ref 3.5–5.0)
Alkaline Phosphatase: 71 U/L (ref 38–126)
Anion gap: 10 (ref 5–15)
BUN: 30 mg/dL — ABNORMAL HIGH (ref 8–23)
CO2: 25 mmol/L (ref 22–32)
Calcium: 8.3 mg/dL — ABNORMAL LOW (ref 8.9–10.3)
Chloride: 105 mmol/L (ref 98–111)
Creatinine, Ser: 1.8 mg/dL — ABNORMAL HIGH (ref 0.61–1.24)
GFR, Estimated: 38 mL/min — ABNORMAL LOW (ref >60)
Glucose, Bld: 167 mg/dL — ABNORMAL HIGH (ref 70–99)
Potassium: 4.3 mmol/L (ref 3.5–5.1)
Sodium: 140 mmol/L (ref 135–145)
Total Bilirubin: 0.4 mg/dL (ref 0.0–1.2)
Total Protein: 5.8 g/dL — ABNORMAL LOW (ref 6.5–8.1)

## 2023-12-31 LAB — TROPONIN I (HIGH SENSITIVITY)
Troponin I (High Sensitivity): 10 ng/L (ref <18)
Troponin I (High Sensitivity): 12 ng/L (ref <18)

## 2023-12-31 LAB — LIPASE, BLOOD: Lipase: 79 U/L — ABNORMAL HIGH (ref 11–51)

## 2023-12-31 MED ORDER — AMOXICILLIN-POT CLAVULANATE 875-125 MG PO TABS
1.0000 | ORAL_TABLET | Freq: Once | ORAL | Status: AC
  Administered 2023-12-31: 1 via ORAL
  Filled 2023-12-31: qty 1

## 2023-12-31 MED ORDER — ONDANSETRON HCL 4 MG/2ML IJ SOLN
4.0000 mg | Freq: Once | INTRAMUSCULAR | Status: AC
  Administered 2023-12-31: 4 mg via INTRAVENOUS
  Filled 2023-12-31: qty 2

## 2023-12-31 MED ORDER — ONDANSETRON 4 MG PO TBDP: 4.0000 mg | ORAL_TABLET | Freq: Three times a day (TID) | ORAL | 0 refills | Status: DC | PRN

## 2023-12-31 MED ORDER — OXYCODONE-ACETAMINOPHEN 5-325 MG PO TABS
1.0000 | ORAL_TABLET | Freq: Four times a day (QID) | ORAL | 0 refills | Status: DC | PRN
Start: 2023-12-31 — End: 2024-01-09

## 2023-12-31 MED ORDER — AMOXICILLIN-POT CLAVULANATE 875-125 MG PO TABS: 1.0000 | ORAL_TABLET | Freq: Two times a day (BID) | ORAL | 0 refills | Status: DC

## 2023-12-31 MED ORDER — IOHEXOL 300 MG/ML  SOLN
80.0000 mL | Freq: Once | INTRAMUSCULAR | Status: AC | PRN
  Administered 2023-12-31: 80 mL via INTRAVENOUS

## 2023-12-31 MED ORDER — MORPHINE SULFATE (PF) 4 MG/ML IV SOLN
4.0000 mg | Freq: Once | INTRAVENOUS | Status: AC
  Administered 2023-12-31: 4 mg via INTRAVENOUS
  Filled 2023-12-31: qty 1

## 2023-12-31 MED ORDER — SODIUM CHLORIDE 0.9 % IV BOLUS
1000.0000 mL | Freq: Once | INTRAVENOUS | Status: AC
  Administered 2023-12-31: 1000 mL via INTRAVENOUS

## 2023-12-31 NOTE — ED Triage Notes (Signed)
 Pt c/o continued abd pain since last visit 4/3.

## 2024-01-01 NOTE — ED Provider Notes (Signed)
 [name] AT [facility] Provider Note   CSN: [id number] Arrival date & time: 12/31/23  1901     History  Chief Complaint  Patient presents with   Abdominal Pain    [name] is a 78 y.o. male.  Pt is a 78 yo male with pmhx significant for GERD, HTN, gout, HLD, RA, anxiety, depression, and ckd.  Pt said he's been having trouble with abd pain for the past few weeks.  He was seen here on 4/3 for the same.  He does not feel any better.  Pt also has some sob.  No fevers.         Home Medications Prior to Admission medications   Medication Sig Start Date End Date Taking? Authorizing Provider  amoxicillin -clavulanate (AUGMENTIN ) 875-125 MG tablet Take 1 tablet by mouth every 12 (twelve) hours. 12/31/23  Yes Sueellen Emery, MD  ondansetron  (ZOFRAN -ODT) 4 MG disintegrating tablet Take 1 tablet (4 mg total) by mouth every 8 (eight) hours as needed. 12/31/23  Yes Sueellen Emery, MD  oxyCODONE -acetaminophen  (PERCOCET/ROXICET) 5-325 MG tablet Take 1 tablet by mouth every 6 (six) hours as needed for severe pain (pain score 7-10). 12/31/23  Yes Sueellen Emery, MD  acetaminophen  (TYLENOL ) 500 MG tablet Take 1 tablet (500 mg total) by mouth every 6 (six) hours as needed for mild pain or headache. 12/28/21   Aura Leeds Latif, DO  allopurinol  (ZYLOPRIM ) 300 MG tablet Take 300 mg by mouth daily.    [provider]  amLODipine  (NORVASC ) 5 MG tablet Take 1 tablet (5 mg total) by mouth daily. 11/21/23 11/15/24  Laurann Pollock, MD  aspirin  EC 81 MG tablet Take 81 mg by mouth daily. Swallow whole.    [provider]  donepezil  (ARICEPT ) 5 MG tablet Take 1 tablet (5 mg total) by mouth at bedtime. 10/17/23   Glory Larsen, MD  escitalopram  (LEXAPRO ) 20 MG tablet Take 20 mg by mouth at bedtime.     [provider]  etanercept (ENBREL SURECLICK) 50 MG/ML injection Inject 50 mg into the skin once a week.    [provider]  folic  acid (FOLVITE ) 1 MG tablet Take 1 mg by mouth daily.    [provider]  levothyroxine (SYNTHROID) 25 MCG tablet Take 12.5 mcg by mouth.    [provider]  losartan  (COZAAR ) 100 MG tablet Take 100 mg by mouth daily.    [provider]  Melatonin 10 MG TABS Take 10 mg by mouth at bedtime as needed.    [provider]  omeprazole (PRILOSEC) 40 MG capsule Take 40 mg by mouth daily. 30 minutes before a meal    [provider]  ondansetron  (ZOFRAN -ODT) 4 MG disintegrating tablet Take 1 tablet (4 mg total) by mouth every 8 (eight) hours as needed for nausea or vomiting. 12/07/23   Mordecai Applebaum, MD  oxyCODONE  (ROXICODONE ) 5 MG immediate release tablet Take 1 tablet (5 mg total) by mouth every 4 (four) hours as needed for severe pain (pain score 7-10). 12/07/23   Mordecai Applebaum, MD  pravastatin  (PRAVACHOL ) 40 MG tablet Take 40 mg by mouth daily. 04/15/21   [provider]  predniSONE  (DELTASONE ) 20 MG tablet Take 20 mg by mouth daily. 06/09/23   [provider]  propranolol  ER (INDERAL  LA) 60 MG 24 hr capsule TAKE 1 CAPSULE DAILY-CALL & SCHEDULE APPOINTMENT FOR MARCH 2024 OR SOONER IF SYMPTOMS CHANGE 07/06/23   Johny Nap, NP  tiZANidine (ZANAFLEX) 2 MG tablet Take 2 mg by mouth 3 (three) times daily as needed. 05/23/23   [provider]  traMADol  (ULTRAM ) 50 MG tablet Take 50 mg by mouth 3 (three) times daily as needed. 04/04/23   [provider]  traZODone  (DESYREL ) 50 MG tablet Take 50 mg by mouth at bedtime as needed. 02/13/21   [provider]      Allergies    Patient has no known allergies.    Review of Systems   Review of Systems  Respiratory:  Positive for shortness of breath.   Gastrointestinal:  Positive for abdominal pain.  All other systems reviewed and are negative.   Physical Exam Updated Vital Signs BP (!) 175/79   Pulse 66   Temp 98.1 F (36.7 C)   Resp 16   Ht 5\' 7"  (1.702 m)    Wt 70 kg   SpO2 97%   BMI 24.17 kg/m  Physical Exam Vitals and nursing note reviewed.  Constitutional:      Appearance: He is well-developed.  HENT:     Head: Normocephalic and atraumatic.     Mouth/Throat:     Mouth: Mucous membranes are moist.     Pharynx: Oropharynx is clear.  Eyes:     Extraocular Movements: Extraocular movements intact.     Pupils: Pupils are equal, round, and reactive to light.  Cardiovascular:     Rate and Rhythm: Normal rate and regular rhythm.     Heart sounds: Normal heart sounds.  Pulmonary:     Effort: Pulmonary effort is normal. Tachypnea present.     Breath sounds: Normal breath sounds.  Abdominal:     General: Abdomen is flat. Bowel sounds are normal.     Palpations: Abdomen is soft.     Tenderness: There is abdominal tenderness in the periumbilical area and left lower quadrant.  Skin:    General: Skin is warm.     Capillary Refill: Capillary refill takes less than 2 seconds.  Neurological:     General: No focal deficit present.     Mental Status: He is alert and oriented to person, place, and time.  Psychiatric:        Mood and Affect: Mood normal.        Behavior: Behavior normal.     ED Results / Procedures / Treatments   Labs (all labs ordered are listed, but only abnormal results are displayed) Labs Reviewed  CBC WITH DIFFERENTIAL/PLATELET - Abnormal; Notable for the following components:      Result Value   WBC 11.6 (*)    RBC 3.44 (*)    Hemoglobin 9.1 (*)    HCT 29.5 (*)    Neutro Abs 8.7 (*)    All other components within normal limits  COMPREHENSIVE METABOLIC PANEL WITH GFR - Abnormal; Notable for the following components:   Glucose, Bld 167 (*)    BUN 30 (*)    Creatinine, Ser 1.80 (*)    Calcium 8.3 (*)    Total Protein 5.8 (*)    Albumin  2.9 (*)    GFR, Estimated 38 (*)    All other components within normal limits  LIPASE, BLOOD - Abnormal; Notable for the following components:   Lipase 79 (*)    All other  components within normal limits  URINALYSIS, ROUTINE W REFLEX MICROSCOPIC  TROPONIN I (HIGH SENSITIVITY)  TROPONIN I (HIGH SENSITIVITY)    EKG None  Radiology CT CHEST ABDOMEN PELVIS W CONTRAST Result Date:  12/31/2023 EXAM: CT CHEST, ABDOMEN AND PELVIS WITH CONTRAST 12/31/2023 09:24:43 PM TECHNIQUE: CT of the chest, abdomen and pelvis was performed with 80 mL of iohexol  (OMNIPAQUE ) 300 mg/mL solution. Multiplanar reformatted images are provided for review. Automated exposure control, iterative reconstruction, and/or weight based adjustment of the mA/kV was utilized to reduce the radiation dose to as low as reasonably achievable. COMPARISON: CT abdomen/pelvis dated 12/07/2023 and CTA chest dated 08/29/2022. CLINICAL HISTORY: Sepsis. Patient complains of continued abdominal pain since last visit 4/3. FINDINGS: MEDIASTINUM: Cardiomegaly. Atherosclerotic calcifications of the aortic root/arch. Moderate 3 vessel coronary atherosclerosis. LYMPH NODES: No evidence of mediastinal, hilar or axillary lymphadenopathy. LUNGS AND PLEURA: 5 mm irregular nodule in the anterior left lower lobe (image 70), at the site of prior plate-like opacity, likely post-infectious/inflammatory scarring. No follow up is recommended. No evidence of pleural effusion or pneumothorax. HEPATOBILIARY: Liver is unremarkable. Status post cholecystectomy. SPLEEN: No acute abnormality. PANCREAS: No acute abnormality. ADRENAL GLANDS: No acute abnormality. KIDNEYS, URETERS AND BLADDER: Scattered simple bilateral renal cysts measuring up to 2.2 cm in the right lower kidney (image 67), benign (Bosniak 1). No follow-up is recommended. No stones in the kidneys or ureters. No evidence of hydronephrosis. No evidence of perinephric or periureteral stranding. GI AND BOWEL: Sigmoid diverticulosis with mild pericolonic stranding (image 98), suggesting mild sigmoid diverticulitis. No drainable fluid collection/abscess. No free air. The appendix is naturally  visualized, likely surgically absent. REPRODUCTIVE: Reproductive organs are unremarkable. PERITONEUM AND RETROPERITONEUM: No evidence of ascites or free air. 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 LYMPH NODES: No evidence of lymphadenopathy. BONES AND SOFT TISSUES: No acute osseous abnormality. Postoperative changes related to prior right inguinal hernia repair. IMPRESSION: 1. Mild sigmoid diverticulitis. No drainable fluid collection/abscess. No free air. 2. Additional ancillary findings, as above. Electronically signed by: Zadie Herter MD 12/31/2023 09:40 PM EDT RP Workstation: [id number]    Procedures Procedures    Medications Ordered in ED Medications  sodium chloride  0.9 % bolus 1,000 mL (0 mLs Intravenous Stopped 12/31/23 2055)  morphine  (PF) 4 MG/ML injection 4 mg (4 mg Intravenous Given 12/31/23 2005)  ondansetron  (ZOFRAN ) injection 4 mg (4 mg Intravenous Given 12/31/23 2004)  iohexol  (OMNIPAQUE ) 300 MG/ML solution 80 mL (80 mLs Intravenous Contrast Given 12/31/23 2114)  ondansetron  (ZOFRAN ) injection 4 mg (4 mg Intravenous Given 12/31/23 2100)  amoxicillin -clavulanate (AUGMENTIN ) 875-125 MG per tablet 1 tablet (1 tablet Oral Given 12/31/23 2253)    ED Course/ Medical Decision Making/ A&P                                 Medical Decision Making Amount and/or Complexity of Data Reviewed Labs: ordered. Radiology: ordered.  Risk Prescription drug management.   This patient presents to the ED for concern of abd pain, this involves an extensive number of treatment options, and is a complaint that carries with it a high risk of complications and morbidity.  The differential diagnosis includes cholecystitis, diverticulitis, uti, pna   Co morbidities that complicate the patient evaluation  GERD, HTN, gout, HLD, RA, anxiety, depression, and ckd   Additional history obtained:  Additional history obtained from epic chart review External records from  outside source obtained and reviewed including wife/EMS report   Lab Tests:  I Ordered, and personally interpreted labs.  The pertinent results include:  cbc with wbc sl elevated at 11.6, hgb 9.1 (10.4 on 4/3 and 9.5 a year ago),  cmp with cr 1.8 (1.47 on 4/3 and 1.84 a year ago); trop nl, lip nl   Imaging Studies ordered:  I ordered imaging studies including ct chest/abd/pelvis  I independently visualized and interpreted imaging which showed  . Mild sigmoid diverticulitis. No drainable fluid collection/abscess. No free  air.  2. Additional ancillary findings, as above.   I agree with the radiologist interpretation   Cardiac Monitoring:  The patient was maintained on a cardiac monitor.  I personally viewed and interpreted the cardiac monitored which showed an underlying rhythm of: nsr   Medicines ordered and prescription drug management:  I ordered medication including morphine /zofran /augmentin   for sx  Reevaluation of the patient after these medicines showed that the patient improved I have reviewed the patients home medicines and have made adjustments as needed   Test Considered:  ct   Critical Interventions:  Abx/pain control   Problem List / ED Course:  Diverticulitis:  pt is feeling much better after treatment.  He is able to tolerate po fluids.  He is stable for d/c.  He is to return if worse.  F/u with pcp.   Reevaluation:  After the interventions noted above, I reevaluated the patient and found that they have :improved   Social Determinants of Health:  Lives at home   Dispostion:  After consideration of the diagnostic results and the patients response to treatment, I feel that the patent would benefit from discharge with outpatient f/u.          Final Clinical Impression(s) / ED Diagnoses Final diagnoses:  Diverticulitis    Rx / DC Orders ED Discharge Orders          Ordered    amoxicillin -clavulanate (AUGMENTIN ) 875-125 MG tablet   Every 12 hours        12/31/23 2243    ondansetron  (ZOFRAN -ODT) 4 MG disintegrating tablet  Every 8 hours PRN        12/31/23 2243    oxyCODONE -acetaminophen  (PERCOCET/ROXICET) 5-325 MG tablet  Every 6 hours PRN        12/31/23 2243              Sueellen Emery, MD 01/01/24 0100

## 2024-01-09 ENCOUNTER — Inpatient Hospital Stay (HOSPITAL_COMMUNITY)
Admission: EM | Admit: 2024-01-09 | Discharge: 2024-01-13 | DRG: 392 | Disposition: A | Discharge status: Home / Self Care | Attending: Family Medicine | Admitting: Family Medicine

## 2024-01-09 ENCOUNTER — Emergency Department (HOSPITAL_COMMUNITY)

## 2024-01-09 ENCOUNTER — Other Ambulatory Visit: Payer: Self-pay | Admitting: Neurosurgery

## 2024-01-09 DIAGNOSIS — F01518 Vascular dementia, unspecified severity, with other behavioral disturbance: Secondary | ICD-10-CM | POA: Diagnosis present

## 2024-01-09 DIAGNOSIS — G4700 Insomnia, unspecified: Secondary | ICD-10-CM | POA: Diagnosis not present

## 2024-01-09 DIAGNOSIS — Z79899 Other long term (current) drug therapy: Secondary | ICD-10-CM

## 2024-01-09 DIAGNOSIS — R627 Adult failure to thrive: Secondary | ICD-10-CM | POA: Diagnosis present

## 2024-01-09 DIAGNOSIS — E872 Acidosis, unspecified: Secondary | ICD-10-CM | POA: Diagnosis not present

## 2024-01-09 DIAGNOSIS — Z7982 Long term (current) use of aspirin: Secondary | ICD-10-CM | POA: Diagnosis not present

## 2024-01-09 DIAGNOSIS — Z79890 Hormone replacement therapy (postmenopausal): Secondary | ICD-10-CM | POA: Diagnosis not present

## 2024-01-09 DIAGNOSIS — Z7983 Long term (current) use of bisphosphonates: Secondary | ICD-10-CM | POA: Diagnosis not present

## 2024-01-09 DIAGNOSIS — Z515 Encounter for palliative care: Secondary | ICD-10-CM

## 2024-01-09 DIAGNOSIS — M069 Rheumatoid arthritis, unspecified: Secondary | ICD-10-CM | POA: Diagnosis present

## 2024-01-09 DIAGNOSIS — I129 Hypertensive chronic kidney disease with stage 1 through stage 4 chronic kidney disease, or unspecified chronic kidney disease: Secondary | ICD-10-CM | POA: Diagnosis not present

## 2024-01-09 DIAGNOSIS — Z7189 Other specified counseling: Secondary | ICD-10-CM | POA: Diagnosis not present

## 2024-01-09 DIAGNOSIS — F32A Depression, unspecified: Secondary | ICD-10-CM | POA: Diagnosis present

## 2024-01-09 DIAGNOSIS — K5732 Diverticulitis of large intestine without perforation or abscess without bleeding: Principal | ICD-10-CM | POA: Diagnosis present

## 2024-01-09 DIAGNOSIS — E785 Hyperlipidemia, unspecified: Secondary | ICD-10-CM | POA: Diagnosis present

## 2024-01-09 DIAGNOSIS — E2740 Unspecified adrenocortical insufficiency: Secondary | ICD-10-CM | POA: Diagnosis present

## 2024-01-09 DIAGNOSIS — N183 Chronic kidney disease, stage 3 unspecified: Secondary | ICD-10-CM | POA: Diagnosis not present

## 2024-01-09 DIAGNOSIS — Z9049 Acquired absence of other specified parts of digestive tract: Secondary | ICD-10-CM | POA: Diagnosis not present

## 2024-01-09 DIAGNOSIS — Z823 Family history of stroke: Secondary | ICD-10-CM

## 2024-01-09 DIAGNOSIS — K5792 Diverticulitis of intestine, part unspecified, without perforation or abscess without bleeding: Secondary | ICD-10-CM | POA: Diagnosis not present

## 2024-01-09 DIAGNOSIS — R109 Unspecified abdominal pain: Secondary | ICD-10-CM | POA: Diagnosis not present

## 2024-01-09 DIAGNOSIS — N1832 Chronic kidney disease, stage 3b: Secondary | ICD-10-CM | POA: Diagnosis present

## 2024-01-09 DIAGNOSIS — G8929 Other chronic pain: Secondary | ICD-10-CM | POA: Diagnosis not present

## 2024-01-09 DIAGNOSIS — Z66 Do not resuscitate: Secondary | ICD-10-CM | POA: Diagnosis not present

## 2024-01-09 DIAGNOSIS — Z87891 Personal history of nicotine dependence: Secondary | ICD-10-CM

## 2024-01-09 DIAGNOSIS — F0153 Vascular dementia, unspecified severity, with mood disturbance: Secondary | ICD-10-CM | POA: Diagnosis present

## 2024-01-09 DIAGNOSIS — R6889 Other general symptoms and signs: Secondary | ICD-10-CM | POA: Diagnosis not present

## 2024-01-09 DIAGNOSIS — D352 Benign neoplasm of pituitary gland: Secondary | ICD-10-CM

## 2024-01-09 DIAGNOSIS — Z981 Arthrodesis status: Secondary | ICD-10-CM | POA: Diagnosis not present

## 2024-01-09 DIAGNOSIS — K219 Gastro-esophageal reflux disease without esophagitis: Secondary | ICD-10-CM | POA: Diagnosis present

## 2024-01-09 DIAGNOSIS — E039 Hypothyroidism, unspecified: Secondary | ICD-10-CM | POA: Diagnosis not present

## 2024-01-09 DIAGNOSIS — I700 Atherosclerosis of aorta: Secondary | ICD-10-CM | POA: Diagnosis not present

## 2024-01-09 DIAGNOSIS — I10 Essential (primary) hypertension: Secondary | ICD-10-CM

## 2024-01-09 DIAGNOSIS — F0154 Vascular dementia, unspecified severity, with anxiety: Secondary | ICD-10-CM | POA: Diagnosis present

## 2024-01-09 DIAGNOSIS — Z8711 Personal history of peptic ulcer disease: Secondary | ICD-10-CM

## 2024-01-09 LAB — URINALYSIS, ROUTINE W REFLEX MICROSCOPIC
Bacteria, UA: NONE SEEN
Bilirubin Urine: NEGATIVE
Glucose, UA: NEGATIVE mg/dL
Hgb urine dipstick: NEGATIVE
Ketones, ur: NEGATIVE mg/dL
Leukocytes,Ua: NEGATIVE
Nitrite: NEGATIVE
Protein, ur: 100 mg/dL — AB
Specific Gravity, Urine: 1.014 (ref 1.005–1.030)
pH: 8 (ref 5.0–8.0)

## 2024-01-09 LAB — COMPREHENSIVE METABOLIC PANEL WITH GFR
ALT: 15 U/L (ref 0–44)
AST: 26 U/L (ref 15–41)
Albumin: 3.2 g/dL — ABNORMAL LOW (ref 3.5–5.0)
Alkaline Phosphatase: 82 U/L (ref 38–126)
Anion gap: 9 (ref 5–15)
BUN: 29 mg/dL — ABNORMAL HIGH (ref 8–23)
CO2: 23 mmol/L (ref 22–32)
Calcium: 8.7 mg/dL — ABNORMAL LOW (ref 8.9–10.3)
Chloride: 103 mmol/L (ref 98–111)
Creatinine, Ser: 1.78 mg/dL — ABNORMAL HIGH (ref 0.61–1.24)
GFR, Estimated: 38 mL/min — ABNORMAL LOW (ref >60)
Glucose, Bld: 145 mg/dL — ABNORMAL HIGH (ref 70–99)
Potassium: 5.2 mmol/L — ABNORMAL HIGH (ref 3.5–5.1)
Sodium: 135 mmol/L (ref 135–145)
Total Bilirubin: 0.6 mg/dL (ref 0.0–1.2)
Total Protein: 6.8 g/dL (ref 6.5–8.1)

## 2024-01-09 LAB — CBC WITH DIFFERENTIAL/PLATELET
Abs Immature Granulocytes: 0.05 10*3/uL (ref 0.00–0.07)
Basophils Absolute: 0 10*3/uL (ref 0.0–0.1)
Basophils Relative: 0 %
Eosinophils Absolute: 0 10*3/uL (ref 0.0–0.5)
Eosinophils Relative: 0 %
HCT: 36.5 % — ABNORMAL LOW (ref 39.0–52.0)
Hemoglobin: 11.6 g/dL — ABNORMAL LOW (ref 13.0–17.0)
Immature Granulocytes: 0 %
Lymphocytes Relative: 14 %
Lymphs Abs: 1.7 10*3/uL (ref 0.7–4.0)
MCH: 26.6 pg (ref 26.0–34.0)
MCHC: 31.8 g/dL (ref 30.0–36.0)
MCV: 83.7 fL (ref 80.0–100.0)
Monocytes Absolute: 0.3 10*3/uL (ref 0.1–1.0)
Monocytes Relative: 2 %
Neutro Abs: 10.7 10*3/uL — ABNORMAL HIGH (ref 1.7–7.7)
Neutrophils Relative %: 84 %
Platelets: 394 10*3/uL (ref 150–400)
RBC: 4.36 MIL/uL (ref 4.22–5.81)
RDW: 15.8 % — ABNORMAL HIGH (ref 11.5–15.5)
WBC: 12.8 10*3/uL — ABNORMAL HIGH (ref 4.0–10.5)
nRBC: 0 % (ref 0.0–0.2)

## 2024-01-09 LAB — LIPASE, BLOOD: Lipase: 80 U/L — ABNORMAL HIGH (ref 11–51)

## 2024-01-09 LAB — MAGNESIUM: Magnesium: 2.1 mg/dL (ref 1.7–2.4)

## 2024-01-09 LAB — LACTIC ACID, PLASMA: Lactic Acid, Venous: 3.2 mmol/L (ref 0.5–1.9)

## 2024-01-09 MED ORDER — CIPROFLOXACIN IN D5W 400 MG/200ML IV SOLN
400.0000 mg | Freq: Once | INTRAVENOUS | Status: AC
  Administered 2024-01-09: 400 mg via INTRAVENOUS
  Filled 2024-01-09: qty 200

## 2024-01-09 MED ORDER — DEXTROSE-SODIUM CHLORIDE 5-0.45 % IV SOLN: INTRAVENOUS | Status: DC

## 2024-01-09 MED ORDER — ESCITALOPRAM OXALATE 10 MG PO TABS
20.0000 mg | ORAL_TABLET | Freq: Every day | ORAL | Status: DC
  Administered 2024-01-10 – 2024-01-12 (×4): 20 mg via ORAL
  Filled 2024-01-09 (×4): qty 2

## 2024-01-09 MED ORDER — DONEPEZIL HCL 5 MG PO TABS
5.0000 mg | ORAL_TABLET | Freq: Every day | ORAL | Status: DC
  Administered 2024-01-10 – 2024-01-12 (×4): 5 mg via ORAL
  Filled 2024-01-09 (×4): qty 1

## 2024-01-09 MED ORDER — METRONIDAZOLE 500 MG/100ML IV SOLN
500.0000 mg | Freq: Once | INTRAVENOUS | Status: AC
  Administered 2024-01-09: 500 mg via INTRAVENOUS
  Filled 2024-01-09: qty 100

## 2024-01-09 MED ORDER — TRAMADOL HCL 50 MG PO TABS
50.0000 mg | ORAL_TABLET | Freq: Three times a day (TID) | ORAL | Status: DC | PRN
  Administered 2024-01-10 – 2024-01-13 (×2): 50 mg via ORAL
  Filled 2024-01-09 (×2): qty 1

## 2024-01-09 MED ORDER — FENTANYL CITRATE PF 50 MCG/ML IJ SOSY
50.0000 ug | PREFILLED_SYRINGE | Freq: Once | INTRAMUSCULAR | Status: AC
  Administered 2024-01-09: 50 ug via INTRAVENOUS
  Filled 2024-01-09: qty 1

## 2024-01-09 MED ORDER — LOSARTAN POTASSIUM 50 MG PO TABS
100.0000 mg | ORAL_TABLET | Freq: Every day | ORAL | Status: DC
  Administered 2024-01-10: 100 mg via ORAL
  Filled 2024-01-09 (×2): qty 2

## 2024-01-09 MED ORDER — LACTATED RINGERS IV BOLUS
1000.0000 mL | Freq: Once | INTRAVENOUS | Status: AC
  Administered 2024-01-09: 1000 mL via INTRAVENOUS

## 2024-01-09 MED ORDER — TRAZODONE HCL 50 MG PO TABS
50.0000 mg | ORAL_TABLET | Freq: Every day | ORAL | Status: DC
  Administered 2024-01-10 – 2024-01-12 (×4): 50 mg via ORAL
  Filled 2024-01-09 (×4): qty 1

## 2024-01-09 MED ORDER — HYDROMORPHONE HCL 1 MG/ML IJ SOLN
0.5000 mg | INTRAMUSCULAR | Status: DC | PRN
  Administered 2024-01-10: 0.5 mg via INTRAVENOUS
  Filled 2024-01-09: qty 0.5

## 2024-01-09 MED ORDER — LEVOTHYROXINE SODIUM 25 MCG PO TABS
12.5000 ug | ORAL_TABLET | Freq: Every day | ORAL | Status: DC
  Administered 2024-01-10 – 2024-01-13 (×4): 12.5 ug via ORAL
  Filled 2024-01-09 (×4): qty 1

## 2024-01-09 MED ORDER — METOCLOPRAMIDE HCL 5 MG/ML IJ SOLN
10.0000 mg | Freq: Once | INTRAMUSCULAR | Status: AC
  Administered 2024-01-09: 10 mg via INTRAVENOUS
  Filled 2024-01-09: qty 2

## 2024-01-09 MED ORDER — MELATONIN 3 MG PO TABS
9.0000 mg | ORAL_TABLET | Freq: Every day | ORAL | Status: DC
  Administered 2024-01-10 – 2024-01-12 (×4): 9 mg via ORAL
  Filled 2024-01-09 (×4): qty 3

## 2024-01-09 MED ORDER — PROPRANOLOL HCL ER 60 MG PO CP24
60.0000 mg | ORAL_CAPSULE | Freq: Every day | ORAL | Status: DC
Start: 2024-01-10 — End: 2024-01-13
  Administered 2024-01-10 – 2024-01-13 (×4): 60 mg via ORAL
  Filled 2024-01-09 (×5): qty 1

## 2024-01-09 MED ORDER — ONDANSETRON HCL 4 MG/2ML IJ SOLN
4.0000 mg | Freq: Once | INTRAMUSCULAR | Status: AC
  Administered 2024-01-09: 4 mg via INTRAVENOUS
  Filled 2024-01-09: qty 2

## 2024-01-09 MED ORDER — AMLODIPINE BESYLATE 5 MG PO TABS
5.0000 mg | ORAL_TABLET | Freq: Every day | ORAL | Status: DC
  Administered 2024-01-10 – 2024-01-13 (×4): 5 mg via ORAL
  Filled 2024-01-09 (×4): qty 1

## 2024-01-09 MED ORDER — PANTOPRAZOLE SODIUM 40 MG PO TBEC
40.0000 mg | DELAYED_RELEASE_TABLET | Freq: Every day | ORAL | Status: DC
  Administered 2024-01-10 – 2024-01-13 (×4): 40 mg via ORAL
  Filled 2024-01-09 (×4): qty 1

## 2024-01-09 MED ORDER — METRONIDAZOLE 500 MG/100ML IV SOLN
500.0000 mg | Freq: Two times a day (BID) | INTRAVENOUS | Status: DC
  Administered 2024-01-10 – 2024-01-13 (×7): 500 mg via INTRAVENOUS
  Filled 2024-01-09 (×6): qty 100

## 2024-01-09 MED ORDER — CIPROFLOXACIN IN D5W 400 MG/200ML IV SOLN
400.0000 mg | Freq: Two times a day (BID) | INTRAVENOUS | Status: DC
  Administered 2024-01-10 – 2024-01-13 (×7): 400 mg via INTRAVENOUS
  Filled 2024-01-09 (×7): qty 200

## 2024-01-09 MED ORDER — ASPIRIN 81 MG PO TBEC
81.0000 mg | DELAYED_RELEASE_TABLET | Freq: Every day | ORAL | Status: DC
  Administered 2024-01-10 – 2024-01-13 (×4): 81 mg via ORAL
  Filled 2024-01-09 (×4): qty 1

## 2024-01-09 MED ORDER — TIZANIDINE HCL 2 MG PO TABS
2.0000 mg | ORAL_TABLET | Freq: Every day | ORAL | Status: DC
Start: 2024-01-10 — End: 2024-01-11
  Administered 2024-01-10 (×2): 2 mg via ORAL
  Filled 2024-01-09 (×2): qty 1

## 2024-01-09 MED ORDER — HYDROMORPHONE HCL 1 MG/ML IJ SOLN
0.5000 mg | Freq: Once | INTRAMUSCULAR | Status: AC
  Administered 2024-01-09: 0.5 mg via INTRAVENOUS
  Filled 2024-01-09: qty 0.5

## 2024-01-09 MED ORDER — ENOXAPARIN SODIUM 30 MG/0.3ML IJ SOSY
30.0000 mg | PREFILLED_SYRINGE | INTRAMUSCULAR | Status: DC
  Administered 2024-01-10 – 2024-01-13 (×4): 30 mg via SUBCUTANEOUS
  Filled 2024-01-09 (×4): qty 0.3

## 2024-01-09 NOTE — ED Provider Notes (Signed)
 [place] EMERGENCY DEPARTMENT AT [facility] Provider Note   CSN: [id number] Arrival date & time: 01/09/24  1846     History  Chief Complaint  Patient presents with   Abdominal Pain    [name] is a 78 y.o. male.  HPI Patient presents for abdominal pain.  Medical history includes PUD, duodenal ulcer, GERD, HLD, HTN, rheumatoid arthritis, anxiety, depression, dementia, CKD.  He was seen in the ED twice last month for abdominal pain.  CT scan a week ago showed mild sigmoid diverticulitis.  He was prescribed Augmentin , Zofran , Percocet.  He lives at home with his wife.  Wife called EMS due to his ongoing and worsening abdominal pain over the past 3 days.  He did have episodes of vomiting earlier today.  History from patient limited by his cognitive impairment.  Although patient reports normal bowel movements, wife reports constipation.  Patient currently endorses a periumbilical pain.  He denies any other areas of discomfort.    Home Medications Prior to Admission medications   Medication Sig Start Date End Date Taking? Authorizing Provider  acetaminophen  (TYLENOL ) 500 MG tablet Take 1 tablet (500 mg total) by mouth every 6 (six) hours as needed for mild pain or headache. 12/28/21  Yes Sheikh, Omair Latif, DO  alendronate (FOSAMAX) 70 MG tablet Take 70 mg by mouth once a week. 12/21/23  Yes [provider]  amLODipine  (NORVASC ) 5 MG tablet Take 1 tablet (5 mg total) by mouth daily. 11/21/23 11/15/24 Yes BranchJoyceann No, MD  amoxicillin -clavulanate (AUGMENTIN ) 875-125 MG tablet Take 1 tablet by mouth every 12 (twelve) hours. 12/31/23  Yes Sueellen Emery, MD  aspirin  EC 81 MG tablet Take 81 mg by mouth daily. Swallow whole.   Yes [provider]  donepezil  (ARICEPT ) 5 MG tablet TAKE 1 TABLET BY MOUTH EVERYDAY AT BEDTIME 01/08/24  Yes Glory Larsen, MD  escitalopram  (LEXAPRO ) 20 MG tablet Take 20 mg by mouth at bedtime.    Yes [provider]   levothyroxine (SYNTHROID) 25 MCG tablet Take 12.5 mcg by mouth daily before breakfast.   Yes [provider]  losartan  (COZAAR ) 100 MG tablet Take 100 mg by mouth daily.   Yes [provider]  Melatonin 10 MG TABS Take 10 mg by mouth at bedtime.   Yes [provider]  meloxicam (MOBIC) 15 MG tablet Take 15 mg by mouth daily as needed for pain. 12/19/23  Yes [provider]  omeprazole (PRILOSEC) 40 MG capsule Take 40 mg by mouth daily. 30 minutes before a meal   Yes [provider]  ondansetron  (ZOFRAN -ODT) 4 MG disintegrating tablet Take 1 tablet (4 mg total) by mouth every 8 (eight) hours as needed for nausea or vomiting. 12/07/23  Yes Mordecai Applebaum, MD  pravastatin  (PRAVACHOL ) 40 MG tablet Take 40 mg by mouth daily. 04/15/21  Yes [provider]  predniSONE  (DELTASONE ) 10 MG tablet Take 10 mg by mouth daily. 12/31/23  Yes [provider]  propranolol  ER (INDERAL  LA) 60 MG 24 hr capsule TAKE 1 CAPSULE DAILY-CALL & SCHEDULE APPOINTMENT FOR MARCH 2024 OR SOONER IF SYMPTOMS CHANGE 07/06/23  Yes McCue, Camilo Cella, NP  tiZANidine (ZANAFLEX) 2 MG tablet Take 2 mg by mouth at bedtime. 05/23/23  Yes [provider]  traMADol  (ULTRAM ) 50 MG tablet Take 50 mg by mouth 3 (three) times daily as needed for moderate pain (pain score 4-6). 04/04/23  Yes [provider]  traZODone  (DESYREL ) 50 MG tablet  Take 50 mg by mouth at bedtime. 02/13/21  Yes [provider]      Allergies    Patient has no known allergies.    Review of Systems   Review of Systems  Gastrointestinal:  Positive for abdominal pain, nausea and vomiting.  All other systems reviewed and are negative.   Physical Exam Updated Vital Signs BP (!) 160/86 (BP Location: Left Arm)   Pulse (!) 59   Temp 97.8 F (36.6 C)   Resp 14   Wt 65.3 kg   SpO2 96%   BMI 22.55 kg/m  Physical Exam Vitals and nursing note reviewed.  Constitutional:      General:  He is not in acute distress.    Appearance: Normal appearance. He is well-developed. He is not ill-appearing, toxic-appearing or diaphoretic.  HENT:     Head: Normocephalic and atraumatic.     Right Ear: External ear normal.     Left Ear: External ear normal.     Nose: Nose normal.     Mouth/Throat:     Mouth: Mucous membranes are moist.  Eyes:     Extraocular Movements: Extraocular movements intact.     Conjunctiva/sclera: Conjunctivae normal.  Cardiovascular:     Rate and Rhythm: Normal rate and regular rhythm.  Pulmonary:     Effort: Pulmonary effort is normal. No respiratory distress.  Abdominal:     Palpations: Abdomen is soft.     Tenderness: There is abdominal tenderness. There is no guarding or rebound.  Musculoskeletal:        General: No swelling. Normal range of motion.     Cervical back: Normal range of motion and neck supple.  Skin:    General: Skin is warm and dry.     Coloration: Skin is not jaundiced or pale.  Neurological:     General: No focal deficit present.     Mental Status: He is alert. He is disoriented.  Psychiatric:        Mood and Affect: Mood normal.        Behavior: Behavior normal.     ED Results / Procedures / Treatments   Labs (all labs ordered are listed, but only abnormal results are displayed) Labs Reviewed  COMPREHENSIVE METABOLIC PANEL WITH GFR - Abnormal; Notable for the following components:      Result Value   Potassium 5.2 (*)    Glucose, Bld 145 (*)    BUN 29 (*)    Creatinine, Ser 1.78 (*)    Calcium 8.7 (*)    Albumin  3.2 (*)    GFR, Estimated 38 (*)    All other components within normal limits  LIPASE, BLOOD - Abnormal; Notable for the following components:   Lipase 80 (*)    All other components within normal limits  CBC WITH DIFFERENTIAL/PLATELET - Abnormal; Notable for the following components:   WBC 12.8 (*)    Hemoglobin 11.6 (*)    HCT 36.5 (*)    RDW 15.8 (*)    Neutro Abs 10.7 (*)    All other components  within normal limits  URINALYSIS, ROUTINE W REFLEX MICROSCOPIC - Abnormal; Notable for the following components:   Protein, ur 100 (*)    All other components within normal limits  LACTIC ACID, PLASMA - Abnormal; Notable for the following components:   Lactic Acid, Venous 3.2 (*)    All other components within normal limits  MAGNESIUM   CBC  COMPREHENSIVE METABOLIC PANEL WITH GFR  POC OCCULT BLOOD,  ED    EKG EKG Interpretation Date/Time:  Tuesday Jan 09 2024 19:33:14 EDT Ventricular Rate:  57 PR Interval:  149 QRS Duration:  106 QT Interval:  444 QTC Calculation: 433 R Axis:   -38  Text Interpretation: Sinus rhythm Left axis deviation RSR' in V1 or V2, right VCD or RVH Confirmed by Iva Mariner [phone]) on 01/09/2024 8:44:46 PM  Radiology CT ABDOMEN PELVIS WO CONTRAST Result Date: 01/09/2024 CLINICAL DATA:  Acute abdominal pain EXAM: CT ABDOMEN AND PELVIS WITHOUT CONTRAST TECHNIQUE: Multidetector CT imaging of the abdomen and pelvis was performed following the standard protocol without IV contrast. RADIATION DOSE REDUCTION: This exam was performed according to the departmental dose-optimization program which includes automated exposure control, adjustment of the mA and/or kV according to patient size and/or use of iterative reconstruction technique. COMPARISON:  12/31/2023 FINDINGS: Lower chest: No acute abnormality. Hepatobiliary: No focal liver abnormality is seen. Status post cholecystectomy. No biliary dilatation. Pancreas: Unremarkable. No pancreatic ductal dilatation or surrounding inflammatory changes. Spleen: Normal in size without focal abnormality. Adrenals/Urinary Tract: Adrenal glands are within normal limits. Kidneys demonstrate no renal calculi or obstructive changes bilaterally. Ureters are within normal limits. The bladder is decompressed. Stomach/Bowel: Changes of sigmoid diverticulitis are again seen and stable. No definitive perforation or abscess formation is noted. The more  proximal colon appears within normal limits. The appendix is unremarkable. Stomach and small bowel are within normal limits. Vascular/Lymphatic: Atherosclerotic calcifications are noted. Stable ectasia of the infrarenal aorta is noted. No adenopathy is noted. Reproductive: Prostate is unremarkable. Other: No abdominal wall hernia or abnormality. No abdominopelvic ascites. Musculoskeletal: Degenerative change of the lumbar spine with postsurgical change. No acute abnormality is noted. IMPRESSION: Mild sigmoid diverticulitis similar to that seen on the prior exam. No abscess or perforation is noted. Electronically Signed   By: Violeta Grey M.D.   On: 01/09/2024 20:55   DG Abd Acute W/Chest Result Date: 01/09/2024 CLINICAL DATA:  Abdominal pain EXAM: DG ABDOMEN ACUTE WITH 1 VIEW CHEST COMPARISON:  12/31/2023 CT FINDINGS: Cardiac shadow is within normal limits. Lungs are well aerated bilaterally. Aortic calcifications are seen. No bony abnormality is noted. Scattered large and small bowel gas is noted. No free air is seen. No obstructive changes are noted. Postsurgical change in the lumbar spine is noted. Degenerative changes of lumbar spine are seen as well. IMPRESSION: Nonspecific chest and abdomen.  No acute abnormality noted. Electronically Signed   By: Violeta Grey M.D.   On: 01/09/2024 19:25    Procedures Procedures    Medications Ordered in ED Medications  ciprofloxacin (CIPRO) IVPB 400 mg (400 mg Intravenous New Bag/Given 01/09/24 2237)  escitalopram  (LEXAPRO ) tablet 20 mg (has no administration in time range)  levothyroxine (SYNTHROID) tablet 12.5 mcg (has no administration in time range)  losartan  (COZAAR ) tablet 100 mg (has no administration in time range)  melatonin tablet 9 mg (has no administration in time range)  pantoprazole  (PROTONIX ) EC tablet 40 mg (has no administration in time range)  propranolol  ER (INDERAL  LA) 24 hr capsule 60 mg (has no administration in time range)  traMADol   (ULTRAM ) tablet 50 mg (has no administration in time range)  traZODone  (DESYREL ) tablet 50 mg (has no administration in time range)  tiZANidine (ZANAFLEX) tablet 2 mg (has no administration in time range)  aspirin  EC tablet 81 mg (has no administration in time range)  donepezil  (ARICEPT ) tablet 5 mg (has no administration in time range)  amLODipine  (NORVASC ) tablet 5 mg (has no administration  in time range)  enoxaparin (LOVENOX) injection 30 mg (has no administration in time range)  dextrose  5 % and 0.45 % NaCl infusion (has no administration in time range)  HYDROmorphone  (DILAUDID ) injection 0.5-1 mg (has no administration in time range)  metroNIDAZOLE  (FLAGYL ) IVPB 500 mg (has no administration in time range)  ciprofloxacin (CIPRO) IVPB 400 mg (has no administration in time range)  fentaNYL  (SUBLIMAZE ) injection 50 mcg (50 mcg Intravenous Given 01/09/24 1923)  lactated ringers  bolus 1,000 mL (0 mLs Intravenous Stopped 01/09/24 2030)  metoCLOPramide  (REGLAN ) injection 10 mg (10 mg Intravenous Given 01/09/24 1923)  ondansetron  (ZOFRAN ) injection 4 mg (4 mg Intravenous Given 01/09/24 2112)  HYDROmorphone  (DILAUDID ) injection 0.5 mg (0.5 mg Intravenous Given 01/09/24 2113)  metroNIDAZOLE  (FLAGYL ) IVPB 500 mg (500 mg Intravenous New Bag/Given 01/09/24 2118)    ED Course/ Medical Decision Making/ A&P                                 Medical Decision Making Amount and/or Complexity of Data Reviewed Labs: ordered. Radiology: ordered.  Risk Prescription drug management. Decision regarding hospitalization.   This patient presents to the ED for concern of abdominal pain, nausea, vomiting, this involves an extensive number of treatment options, and is a complaint that carries with it a high risk of complications and morbidity.  The differential diagnosis includes enteritis, colitis, diverticulitis, other intra-abdominal infection, SBO   Co morbidities that complicate the patient evaluation  PUD,  duodenal ulcer, GERD, HLD, HTN, rheumatoid arthritis, anxiety, depression, dementia, CKD   Additional history obtained:  Additional history obtained from EMS External records from outside source obtained and reviewed including EMR   Lab Tests:  I Ordered, and personally interpreted labs.  The pertinent results include: Hemoglobin mildly increased for baseline, possibly due to hemoconcentration; mild increase in leukocytosis from lab work last week; creatinine appears to be baseline.  There is slight hyperkalemia.  Initial lactate is elevated.  Hepatobiliary enzymes are normal.   Imaging Studies ordered:  I ordered imaging studies including x-ray of chest and abdomen, CT scan of abdomen and pelvis I independently visualized and interpreted imaging which showed redemonstration of known uncomplicated diverticulitis. I agree with the radiologist interpretation   Cardiac Monitoring: / EKG:  The patient was maintained on a cardiac monitor.  I personally viewed and interpreted the cardiac monitored which showed an underlying rhythm of: Sinus rhythm   Problem List / ED Course / Critical interventions / Medication management  Patient presenting for reportedly 3 days of worsening abdominal pain in addition to nausea, vomiting, and constipation.  On arrival in the ED, he describes periumbilical pain.  Pain does not radiate.  Tenderness is present on exam.  Medications ordered for symptomatic relief.  Workup was initiated.  Lab work notable for leukocytosis and lactic acidosis.  CT scan showed redemonstration of known uncomplicated diverticulitis.  Patient was started on IV antibiotics.  He did require repeated doses of narcotic pain medication and antiemetics while in the ED.  Patient was admitted for further management. I ordered medication including IV fluids for hydration; fentanyl  and Dilaudid  for analgesia; Reglan  Zofran  for nausea; ciprofloxacin and Flagyl  for diverticulitis Reevaluation of  the patient after these medicines showed that the patient improved I have reviewed the patients home medicines and have made adjustments as needed   Social Determinants of Health:  Lives at home with wife        Final  Clinical Impression(s) / ED Diagnoses Final diagnoses:  Diverticulitis    Rx / DC Orders ED Discharge Orders     None         Iva Mariner, MD 01/09/24 2328

## 2024-01-09 NOTE — H&P (Signed)
 History and Physical    [name] ZOX:[id number] DOB: 09-14-1945 DOA: 01/09/2024  PCP: Yvonnie Heritage, NP Patient coming from: home  Chief Complaint: abd pain  HPI: [name] is a 78 y.o. male with medical history significant of CKD, GERD, HLD, HTN, RA.  Presenting w/ progressive ABD pain. Started nearly 2 wks ago. Seen on 12/31/23 and Dx w/ mild diverticulitis and started on Augmentin . Pain is midline and constant. Associated w/ n/v. No Melena, hematochezia, BRBPR.    ED Course: workup as below. Started on cipro/flagyl   Review of Systems: As per HPI otherwise all other systems reviewed and are negative  Ambulatory Status:no restrictions  Past Medical History:  Diagnosis Date   Anxiety and depression    Chronic renal insufficiency, stage 3 (moderate) (HCC)    DDD (degenerative disc disease), lumbar    remote hx of fusion surgery   Dysrhythmia    isolated afib/flutter   GERD (gastroesophageal reflux disease)    Gout    History of stomach ulcers 2000   Hyperlipidemia    Hypertension    NAUSEA WITH VOMITING 10/15/2010   Rheumatoid arthritis (HCC)    Rheumatoid     Past Surgical History:  Procedure Laterality Date   APPENDECTOMY  1968   back fusion  2000   CHOLECYSTECTOMY N/A 06/06/2017   Procedure: LAPAROSCOPIC CHOLECYSTECTOMY WITH INTRAOPERATIVE CHOLANGIOGRAM;  Surgeon: Oralee Billow, MD;  Location: [facility];  Service: General;  Laterality: N/A;   CRANIOTOMY N/A 10/20/2022   Procedure: TRANSSPHENOIDAL RESECTION OF PITUITARY TUMOR;  Surgeon: Garry Kansas, MD;  Location: [facility] OR;  Service: Neurosurgery;  Laterality: N/A;   EYE SURGERY     Dr. Gennie Kicks  lens implants   LUMBAR LAMINECTOMY  2000   NASAL SEPTOPLASTY W/ TURBINOPLASTY Bilateral 10/20/2022   Procedure: NASAL SEPTOPLASTY;  Surgeon: Daleen Dubs, DO;  Location: [facility] OR;  Service: ENT;  Laterality: Bilateral;   TIBIA FRACTURE SURGERY Left 1998   with titanium rods   TRANSPHENOIDAL APPROACH EXPOSURE  Bilateral 10/20/2022   Procedure: TRANSPHENOIDAL APPROACH EXPOSURE;  Surgeon: Daleen Dubs, DO;  Location: [facility] OR;  Service: ENT;  Laterality: Bilateral;   UMBILICAL HERNIA REPAIR N/A 06/06/2017   Procedure: UMBILICAL HERNIA REPAIR;  Surgeon: Oralee Billow, MD;  Location: [facility];  Service: General;  Laterality: N/A;    Social History   Socioeconomic History   Marital status: Married    Spouse name: Statistician   Number of children: 1   Years of education: Not on file   Highest education level: Not on file  Occupational History   Not on file  Tobacco Use   Smoking status: Former    Current packs/day: 0.00    Average packs/day: 2.0 packs/day for 20.0 years (40.0 ttl pk-yrs)    Types: Cigarettes    Start date: 5    Quit date: 1980    Years since quitting: 45.3   Smokeless tobacco: Never   Tobacco comments:    Stopped early 32s  Vaping Use   Vaping status: Never Used  Substance and Sexual Activity   Alcohol use: No   Drug use: No   Sexual activity: Yes  Other Topics Concern   Not on file  Social History Narrative   Lives at home with spouse   Right handed   Caffeine : 2 cups/day, tea seldom   Social Drivers of Corporate investment banker Strain: Not on file  Food Insecurity: No Food Insecurity (01/09/2024)   Hunger Vital Sign  Worried About Programme researcher, broadcasting/film/video in the Last Year: Never true    Ran Out of Food in the Last Year: Never true  Transportation Needs: No Transportation Needs (01/09/2024)   PRAPARE - Administrator, Civil Service (Medical): No    Lack of Transportation (Non-Medical): No  Physical Activity: Not on file  Stress: Not on file  Social Connections: Moderately Isolated (01/09/2024)   Social Connection and Isolation Panel [NHANES]    Frequency of Communication with Friends and Family: More than three times a week    Frequency of Social Gatherings with Friends and Family: More than three times a week    Attends Religious Services: Never     Database administrator or Organizations: No    Attends Engineer, structural: Not on file    Marital Status: Married  Catering manager Violence: Not At Risk (01/09/2024)   Humiliation, Afraid, Rape, and Kick questionnaire    Fear of Current or Ex-Partner: No    Emotionally Abused: No    Physically Abused: No    Sexually Abused: No    No Known Allergies  Family History  Problem Relation Age of Onset   Cancer Mother    CVA Mother    Cancer Maternal Grandmother    Healthy Son    Colon cancer Neg Hx       Prior to Admission medications   Medication Sig Start Date End Date Taking? Authorizing Provider  acetaminophen  (TYLENOL ) 500 MG tablet Take 1 tablet (500 mg total) by mouth every 6 (six) hours as needed for mild pain or headache. 12/28/21  Yes Sheikh, Omair Latif, DO  alendronate (FOSAMAX) 70 MG tablet Take 70 mg by mouth once a week. 12/21/23  Yes [provider]  amLODipine  (NORVASC ) 5 MG tablet Take 1 tablet (5 mg total) by mouth daily. 11/21/23 11/15/24 Yes BranchJoyceann No, MD  amoxicillin -clavulanate (AUGMENTIN ) 875-125 MG tablet Take 1 tablet by mouth every 12 (twelve) hours. 12/31/23  Yes Sueellen Emery, MD  aspirin  EC 81 MG tablet Take 81 mg by mouth daily. Swallow whole.   Yes [provider]  donepezil  (ARICEPT ) 5 MG tablet TAKE 1 TABLET BY MOUTH EVERYDAY AT BEDTIME 01/08/24  Yes Glory Larsen, MD  escitalopram  (LEXAPRO ) 20 MG tablet Take 20 mg by mouth at bedtime.    Yes [provider]  levothyroxine (SYNTHROID) 25 MCG tablet Take 12.5 mcg by mouth daily before breakfast.   Yes [provider]  losartan  (COZAAR ) 100 MG tablet Take 100 mg by mouth daily.   Yes [provider]  Melatonin 10 MG TABS Take 10 mg by mouth at bedtime.   Yes [provider]  meloxicam (MOBIC) 15 MG tablet Take 15 mg by mouth daily as needed for pain. 12/19/23  Yes [provider]  omeprazole (PRILOSEC) 40 MG capsule Take 40  mg by mouth daily. 30 minutes before a meal   Yes [provider]  ondansetron  (ZOFRAN -ODT) 4 MG disintegrating tablet Take 1 tablet (4 mg total) by mouth every 8 (eight) hours as needed for nausea or vomiting. 12/07/23  Yes Mordecai Applebaum, MD  pravastatin  (PRAVACHOL ) 40 MG tablet Take 40 mg by mouth daily. 04/15/21  Yes [provider]  predniSONE  (DELTASONE ) 10 MG tablet Take 10 mg by mouth daily. 12/31/23  Yes [provider]  propranolol  ER (INDERAL  LA) 60 MG 24 hr capsule TAKE 1 CAPSULE DAILY-CALL & SCHEDULE APPOINTMENT FOR MARCH 2024 OR SOONER  IF SYMPTOMS CHANGE 07/06/23  Yes McCue, Camilo Cella, NP  tiZANidine (ZANAFLEX) 2 MG tablet Take 2 mg by mouth at bedtime. 05/23/23  Yes [provider]  traMADol  (ULTRAM ) 50 MG tablet Take 50 mg by mouth 3 (three) times daily as needed for moderate pain (pain score 4-6). 04/04/23  Yes [provider]  traZODone  (DESYREL ) 50 MG tablet Take 50 mg by mouth at bedtime. 02/13/21  Yes [provider]    Physical Exam: Vitals:   01/09/24 1857 01/09/24 1915 01/09/24 2000 01/09/24 2158  BP: 111/85 138/79 (!) 158/73 (!) 160/86  Pulse: (!) 55 64 63 (!) 59  Resp: 17 (!) 24 19 14   Temp: 97.8 F (36.6 C)   97.8 F (36.6 C)  TempSrc: Oral     SpO2:  99% 93% 96%  Weight:    65.3 kg     General:  Appears calm and comfortable Eyes:  PERRL, EOMI, normal lids, iris ENT:  grossly normal hearing, lips & tongue, mmm Neck:  no LAD, masses or thyromegaly Cardiovascular:  RRR, no m/r/g. No LE edema.  Respiratory:  CTA bilaterally, no w/r/r. Normal respiratory effort. Abdomen: NABS, slightly distended and ttp.  Skin:  no rash or induration seen on limited exam Musculoskeletal:  grossly normal tone BUE/BLE, good ROM, no bony abnormality Psychiatric:  grossly normal mood and affect, speech fluent and appropriate, AOx3 Neurologic:  CN 2-12 grossly intact, moves all extremities in coordinated fashion, sensation  intact  Labs on Admission: I have personally reviewed following labs and imaging studies  CBC: Recent Labs  Lab 01/09/24 1904  WBC 12.8*  NEUTROABS 10.7*  HGB 11.6*  HCT 36.5*  MCV 83.7  PLT 394   Basic Metabolic Panel: Recent Labs  Lab 01/09/24 1904  NA 135  K 5.2*  CL 103  CO2 23  GLUCOSE 145*  BUN 29*  CREATININE 1.78*  CALCIUM 8.7*  MG 2.1   GFR: Estimated Creatinine Clearance: 31.1 mL/min (A) (by C-G formula based on SCr of 1.78 mg/dL (H)). Liver Function Tests: Recent Labs  Lab 01/09/24 1904  AST 26  ALT 15  ALKPHOS 82  BILITOT 0.6  PROT 6.8  ALBUMIN  3.2*   Recent Labs  Lab 01/09/24 1904  LIPASE 80*   No results for input(s): "AMMONIA" in the last 168 hours. Coagulation Profile: No results for input(s): "INR", "PROTIME" in the last 168 hours. Cardiac Enzymes: No results for input(s): "CKTOTAL", "CKMB", "CKMBINDEX", "TROPONINI" in the last 168 hours. BNP (last 3 results) No results for input(s): "PROBNP" in the last 8760 hours. HbA1C: No results for input(s): "HGBA1C" in the last 72 hours. CBG: No results for input(s): "GLUCAP" in the last 168 hours. Lipid Profile: No results for input(s): "CHOL", "HDL", "LDLCALC", "TRIG", "CHOLHDL", "LDLDIRECT" in the last 72 hours. Thyroid  Function Tests: No results for input(s): "TSH", "T4TOTAL", "FREET4", "T3FREE", "THYROIDAB" in the last 72 hours. Anemia Panel: No results for input(s): "VITAMINB12", "FOLATE", "FERRITIN", "TIBC", "IRON", "RETICCTPCT" in the last 72 hours. Urine analysis:    Component Value Date/Time   COLORURINE YELLOW 01/09/2024 1927   APPEARANCEUR CLEAR 01/09/2024 1927   LABSPEC 1.014 01/09/2024 1927   PHURINE 8.0 01/09/2024 1927   GLUCOSEU NEGATIVE 01/09/2024 1927   HGBUR NEGATIVE 01/09/2024 1927   BILIRUBINUR NEGATIVE 01/09/2024 1927   KETONESUR NEGATIVE 01/09/2024 1927   PROTEINUR 100 (A) 01/09/2024 1927   NITRITE NEGATIVE 01/09/2024 1927   LEUKOCYTESUR NEGATIVE 01/09/2024  1927    Creatinine Clearance: Estimated Creatinine Clearance: 31.1 mL/min (A) (  by C-G formula based on SCr of 1.78 mg/dL (H)).  Sepsis Labs: @LABRCNTIP (procalcitonin:4,lacticidven:4) )No results found for this or any previous visit (from the past 240 hours).   Radiological Exams on Admission: CT ABDOMEN PELVIS WO CONTRAST Result Date: 01/09/2024 CLINICAL DATA:  Acute abdominal pain EXAM: CT ABDOMEN AND PELVIS WITHOUT CONTRAST TECHNIQUE: Multidetector CT imaging of the abdomen and pelvis was performed following the standard protocol without IV contrast. RADIATION DOSE REDUCTION: This exam was performed according to the departmental dose-optimization program which includes automated exposure control, adjustment of the mA and/or kV according to patient size and/or use of iterative reconstruction technique. COMPARISON:  12/31/2023 FINDINGS: Lower chest: No acute abnormality. Hepatobiliary: No focal liver abnormality is seen. Status post cholecystectomy. No biliary dilatation. Pancreas: Unremarkable. No pancreatic ductal dilatation or surrounding inflammatory changes. Spleen: Normal in size without focal abnormality. Adrenals/Urinary Tract: Adrenal glands are within normal limits. Kidneys demonstrate no renal calculi or obstructive changes bilaterally. Ureters are within normal limits. The bladder is decompressed. Stomach/Bowel: Changes of sigmoid diverticulitis are again seen and stable. No definitive perforation or abscess formation is noted. The more proximal colon appears within normal limits. The appendix is unremarkable. Stomach and small bowel are within normal limits. Vascular/Lymphatic: Atherosclerotic calcifications are noted. Stable ectasia of the infrarenal aorta is noted. No adenopathy is noted. Reproductive: Prostate is unremarkable. Other: No abdominal wall hernia or abnormality. No abdominopelvic ascites. Musculoskeletal: Degenerative change of the lumbar spine with postsurgical change. No  acute abnormality is noted. IMPRESSION: Mild sigmoid diverticulitis similar to that seen on the prior exam. No abscess or perforation is noted. Electronically Signed   By: Violeta Grey M.D.   On: 01/09/2024 20:55   DG Abd Acute W/Chest Result Date: 01/09/2024 CLINICAL DATA:  Abdominal pain EXAM: DG ABDOMEN ACUTE WITH 1 VIEW CHEST COMPARISON:  12/31/2023 CT FINDINGS: Cardiac shadow is within normal limits. Lungs are well aerated bilaterally. Aortic calcifications are seen. No bony abnormality is noted. Scattered large and small bowel gas is noted. No free air is seen. No obstructive changes are noted. Postsurgical change in the lumbar spine is noted. Degenerative changes of lumbar spine are seen as well. IMPRESSION: Nonspecific chest and abdomen.  No acute abnormality noted. Electronically Signed   By: Violeta Grey M.D.   On: 01/09/2024 19:25   Assessment/Plan Principal Problem:   Diverticulitis   Diverticulitis: Progressive despite being on Augmentin  outpt. WBC 12.8.  - Cipro/flagyl  - NPO except sips w/ meds.   HTN: - continue Amlodipine , Losartan , Propranolol   CKD: Cr 1.78. Baseline - BMP in am   Hypothyroid: - continue Synthroid  Insomnia: - continue Melatonin, Trazodone .   Chronic pain: - continue home tramodol  GERD: - continue PPI  Depression/Anxiety/memory: at baseline Continue Aricept , Lexapro     DVT prophylaxis: lovenox  Code Status: full  Family Communication: none  Disposition Plan: pending improvement  Consults called: none  Admission status: inpt    Otilia Bloch MD [facility]  If 7PM-7AM, please contact night-coverage [email or web address] Password [facility]  01/09/2024, 10:55 PM

## 2024-01-09 NOTE — Progress Notes (Addendum)
 Critical Lactic reported from lab:  Lactic 3.2  On call provider paged and notified. Lactic acid plasma ordered.

## 2024-01-09 NOTE — ED Triage Notes (Signed)
 Pt arrived via [facility] from home with c/o abd pain mid, 10/10 x 3 days, Hx of diverticulitis and was seen here for the SAME last week ago. Pt also has a Hx of demetia. Per ems per spouse, pt has had an unsteady gait x 2 days

## 2024-01-09 NOTE — ED Notes (Signed)
 .ED TO INPATIENT HANDOFF REPORT  ED Nurse Name and Phone #: [phone]  S Name/Age/Gender [name] 78 y.o. male Room/Bed: APA02/APA02  Code Status   Code Status: Prior  Home/SNF/Other Home Patient oriented to: self, place, time, and situation Is this baseline? Yes   Triage Complete: Triage complete  Chief Complaint Diverticulitis [K57.92]  Triage Note Pt arrived via [facility] from home with c/o abd pain mid, 10/10 x 3 days, Hx of diverticulitis and was seen here for the SAME last week ago. Pt also has a Hx of demetia. Per ems per spouse, pt has had an unsteady gait x 2 days   Allergies No Known Allergies  Level of Care/Admitting Diagnosis ED Disposition     ED Disposition  Admit   Condition  --   Comment  Hospital Area: [facility] [[id number]]  Level of Care: Med-Surg [16]  Covid Evaluation: Asymptomatic - no recent exposure (last 10 days) testing not required  Diagnosis: Diverticulitis [657846]  Admitting Physician: Otilia Bloch [[id number]]  Attending Physician: Ambrose Junk, DAVID J [phone]  Certification:: I certify there are rare and unusual circumstances requiring inpatient admission  Expected Medical Readiness: 01/12/2024          B Medical/Surgery History Past Medical History:  Diagnosis Date   Anxiety and depression    Chronic renal insufficiency, stage 3 (moderate) (HCC)    DDD (degenerative disc disease), lumbar    remote hx of fusion surgery   Dysrhythmia    isolated afib/flutter   GERD (gastroesophageal reflux disease)    Gout    History of stomach ulcers 2000   Hyperlipidemia    Hypertension    NAUSEA WITH VOMITING 10/15/2010   Rheumatoid arthritis (HCC)    Rheumatoid    Past Surgical History:  Procedure Laterality Date   APPENDECTOMY  1968   back fusion  2000   CHOLECYSTECTOMY N/A 06/06/2017   Procedure: LAPAROSCOPIC CHOLECYSTECTOMY WITH INTRAOPERATIVE CHOLANGIOGRAM;  Surgeon: Oralee Billow, MD;  Location: [facility];   Service: General;  Laterality: N/A;   CRANIOTOMY N/A 10/20/2022   Procedure: TRANSSPHENOIDAL RESECTION OF PITUITARY TUMOR;  Surgeon: Garry Kansas, MD;  Location: [facility] OR;  Service: Neurosurgery;  Laterality: N/A;   EYE SURGERY     Dr. Gennie Kicks  lens implants   LUMBAR LAMINECTOMY  2000   NASAL SEPTOPLASTY W/ TURBINOPLASTY Bilateral 10/20/2022   Procedure: NASAL SEPTOPLASTY;  Surgeon: Daleen Dubs, DO;  Location: [facility] OR;  Service: ENT;  Laterality: Bilateral;   TIBIA FRACTURE SURGERY Left 1998   with titanium rods   TRANSPHENOIDAL APPROACH EXPOSURE Bilateral 10/20/2022   Procedure: TRANSPHENOIDAL APPROACH EXPOSURE;  Surgeon: Daleen Dubs, DO;  Location: [facility] OR;  Service: ENT;  Laterality: Bilateral;   UMBILICAL HERNIA REPAIR N/A 06/06/2017   Procedure: UMBILICAL HERNIA REPAIR;  Surgeon: Oralee Billow, MD;  Location: [facility];  Service: General;  Laterality: N/A;     A IV Location/Drains/Wounds Patient Lines/Drains/Airways Status     Active Line/Drains/Airways     Name Placement date Placement time Site Days   Peripheral IV 01/09/24 20 G 1" Right Antecubital 01/09/24  1905  Antecubital  less than 1   Incision - 4 Ports Abdomen Umbilicus Right;Mid;Lateral Upper;Mid Right;Upper;Mid 06/06/17  0800  -- 2408            Intake/Output Last 24 hours  Intake/Output Summary (Last 24 hours) at 01/09/2024 2122 Last data filed at 01/09/2024 2030 Gross per 24 hour  Intake 1000 ml  Output --  Net 1000 ml    Labs/Imaging Results for orders placed or performed during the hospital encounter of 01/09/24 (from the past 48 hours)  Comprehensive metabolic panel     Status: Abnormal   Collection Time: 01/09/24  7:04 PM  Result Value Ref Range   Sodium 135 135 - 145 mmol/L   Potassium 5.2 (H) 3.5 - 5.1 mmol/L   Chloride 103 98 - 111 mmol/L   CO2 23 22 - 32 mmol/L   Glucose, Bld 145 (H) 70 - 99 mg/dL    Comment: Glucose reference range applies only to samples taken after fasting for at  least 8 hours.   BUN 29 (H) 8 - 23 mg/dL   Creatinine, Ser 5.78 (H) 0.61 - 1.24 mg/dL   Calcium 8.7 (L) 8.9 - 10.3 mg/dL   Total Protein 6.8 6.5 - 8.1 g/dL   Albumin  3.2 (L) 3.5 - 5.0 g/dL   AST 26 15 - 41 U/L   ALT 15 0 - 44 U/L   Alkaline Phosphatase 82 38 - 126 U/L   Total Bilirubin 0.6 0.0 - 1.2 mg/dL   GFR, Estimated 38 (L) >60 mL/min    Comment: (NOTE) Calculated using the CKD-EPI Creatinine Equation (2021)    Anion gap 9 5 - 15    Comment: Performed at [facility], [address] [place], [place] [address]  Lipase, blood     Status: Abnormal   Collection Time: 01/09/24  7:04 PM  Result Value Ref Range   Lipase 80 (H) 11 - 51 U/L    Comment: Performed at [facility], [address] [place], [place] [address]  CBC with Diff     Status: Abnormal   Collection Time: 01/09/24  7:04 PM  Result Value Ref Range   WBC 12.8 (H) 4.0 - 10.5 K/uL   RBC 4.36 4.22 - 5.81 MIL/uL   Hemoglobin 11.6 (L) 13.0 - 17.0 g/dL   HCT 13.2 (L) 44.0 - 10.2 %   MCV 83.7 80.0 - 100.0 fL   MCH 26.6 26.0 - 34.0 pg   MCHC 31.8 30.0 - 36.0 g/dL   RDW 72.5 (H) 36.6 - 44.0 %   Platelets 394 150 - 400 K/uL   nRBC 0.0 0.0 - 0.2 %   Neutrophils Relative % 84 %   Neutro Abs 10.7 (H) 1.7 - 7.7 K/uL   Lymphocytes Relative 14 %   Lymphs Abs 1.7 0.7 - 4.0 K/uL   Monocytes Relative 2 %   Monocytes Absolute 0.3 0.1 - 1.0 K/uL   Eosinophils Relative 0 %   Eosinophils Absolute 0.0 0.0 - 0.5 K/uL   Basophils Relative 0 %   Basophils Absolute 0.0 0.0 - 0.1 K/uL   Immature Granulocytes 0 %   Abs Immature Granulocytes 0.05 0.00 - 0.07 K/uL    Comment: Performed at [facility], [address], [place], [place] [address]  Magnesium      Status: None   Collection Time: 01/09/24  7:04 PM  Result Value Ref Range   Magnesium  2.1 1.7 - 2.4 mg/dL    Comment: Performed at [facility], [address] [place], [place] [address]  Urinalysis, Routine w reflex microscopic -Urine, Clean Catch     Status: Abnormal    Collection Time: 01/09/24  7:27 PM  Result Value Ref Range   Color, Urine YELLOW YELLOW   APPearance CLEAR CLEAR   Specific Gravity, Urine 1.014 1.005 - 1.030   pH 8.0 5.0 - 8.0   Glucose, UA NEGATIVE  NEGATIVE mg/dL   Hgb urine dipstick NEGATIVE NEGATIVE   Bilirubin Urine NEGATIVE NEGATIVE   Ketones, ur NEGATIVE NEGATIVE mg/dL   Protein, ur 161 (A) NEGATIVE mg/dL   Nitrite NEGATIVE NEGATIVE   Leukocytes,Ua NEGATIVE NEGATIVE   RBC / HPF 0-5 0 - 5 RBC/hpf   WBC, UA 0-5 0 - 5 WBC/hpf   Bacteria, UA NONE SEEN NONE SEEN   Squamous Epithelial / HPF 0-5 0 - 5 /HPF   Mucus PRESENT    Hyaline Casts, UA PRESENT     Comment: Performed at [facility], [address], [place], [place] [address]   CT ABDOMEN PELVIS WO CONTRAST Result Date: 01/09/2024 CLINICAL DATA:  Acute abdominal pain EXAM: CT ABDOMEN AND PELVIS WITHOUT CONTRAST TECHNIQUE: Multidetector CT imaging of the abdomen and pelvis was performed following the standard protocol without IV contrast. RADIATION DOSE REDUCTION: This exam was performed according to the departmental dose-optimization program which includes automated exposure control, adjustment of the mA and/or kV according to patient size and/or use of iterative reconstruction technique. COMPARISON:  12/31/2023 FINDINGS: Lower chest: No acute abnormality. Hepatobiliary: No focal liver abnormality is seen. Status post cholecystectomy. No biliary dilatation. Pancreas: Unremarkable. No pancreatic ductal dilatation or surrounding inflammatory changes. Spleen: Normal in size without focal abnormality. Adrenals/Urinary Tract: Adrenal glands are within normal limits. Kidneys demonstrate no renal calculi or obstructive changes bilaterally. Ureters are within normal limits. The bladder is decompressed. Stomach/Bowel: Changes of sigmoid diverticulitis are again seen and stable. No definitive perforation or abscess formation is noted. The more proximal colon appears within normal limits. The  appendix is unremarkable. Stomach and small bowel are within normal limits. Vascular/Lymphatic: Atherosclerotic calcifications are noted. Stable ectasia of the infrarenal aorta is noted. No adenopathy is noted. Reproductive: Prostate is unremarkable. Other: No abdominal wall hernia or abnormality. No abdominopelvic ascites. Musculoskeletal: Degenerative change of the lumbar spine with postsurgical change. No acute abnormality is noted. IMPRESSION: Mild sigmoid diverticulitis similar to that seen on the prior exam. No abscess or perforation is noted. Electronically Signed   By: Violeta Grey M.D.   On: 01/09/2024 20:55   DG Abd Acute W/Chest Result Date: 01/09/2024 CLINICAL DATA:  Abdominal pain EXAM: DG ABDOMEN ACUTE WITH 1 VIEW CHEST COMPARISON:  12/31/2023 CT FINDINGS: Cardiac shadow is within normal limits. Lungs are well aerated bilaterally. Aortic calcifications are seen. No bony abnormality is noted. Scattered large and small bowel gas is noted. No free air is seen. No obstructive changes are noted. Postsurgical change in the lumbar spine is noted. Degenerative changes of lumbar spine are seen as well. IMPRESSION: Nonspecific chest and abdomen.  No acute abnormality noted. Electronically Signed   By: Violeta Grey M.D.   On: 01/09/2024 19:25    Pending Labs Unresulted Labs (From admission, onward)     Start     Ordered   01/09/24 1856  Lactic acid, plasma  Once,   STAT        01/09/24 1857            Vitals/Pain Today's Vitals   01/09/24 1923 01/09/24 2000 01/09/24 2002 01/09/24 2113  BP:  (!) 158/73    Pulse:  63    Resp:  19    Temp:      TempSrc:      SpO2:  93%    PainSc: 10-Worst pain ever  8  8     Isolation Precautions No active isolations  Medications Medications  ciprofloxacin (CIPRO) IVPB 400 mg (has no  administration in time range)  metroNIDAZOLE  (FLAGYL ) IVPB 500 mg (500 mg Intravenous New Bag/Given 01/09/24 2118)  fentaNYL  (SUBLIMAZE ) injection 50 mcg (50 mcg  Intravenous Given 01/09/24 1923)  lactated ringers  bolus 1,000 mL (0 mLs Intravenous Stopped 01/09/24 2030)  metoCLOPramide  (REGLAN ) injection 10 mg (10 mg Intravenous Given 01/09/24 1923)  ondansetron  (ZOFRAN ) injection 4 mg (4 mg Intravenous Given 01/09/24 2112)  HYDROmorphone  (DILAUDID ) injection 0.5 mg (0.5 mg Intravenous Given 01/09/24 2113)    Mobility walks     Focused Assessments Abdominal pain , nausea   R Recommendations: See Admitting Provider Note  Report given to:   Additional Notes:

## 2024-01-10 DIAGNOSIS — K5792 Diverticulitis of intestine, part unspecified, without perforation or abscess without bleeding: Secondary | ICD-10-CM | POA: Diagnosis not present

## 2024-01-10 LAB — COMPREHENSIVE METABOLIC PANEL WITH GFR
ALT: 12 U/L (ref 0–44)
AST: 14 U/L — ABNORMAL LOW (ref 15–41)
Albumin: 2.5 g/dL — ABNORMAL LOW (ref 3.5–5.0)
Alkaline Phosphatase: 59 U/L (ref 38–126)
Anion gap: 9 (ref 5–15)
BUN: 29 mg/dL — ABNORMAL HIGH (ref 8–23)
CO2: 21 mmol/L — ABNORMAL LOW (ref 22–32)
Calcium: 7.8 mg/dL — ABNORMAL LOW (ref 8.9–10.3)
Chloride: 107 mmol/L (ref 98–111)
Creatinine, Ser: 1.6 mg/dL — ABNORMAL HIGH (ref 0.61–1.24)
GFR, Estimated: 44 mL/min — ABNORMAL LOW (ref >60)
Glucose, Bld: 92 mg/dL (ref 70–99)
Potassium: 4.3 mmol/L (ref 3.5–5.1)
Sodium: 137 mmol/L (ref 135–145)
Total Bilirubin: 0.5 mg/dL (ref 0.0–1.2)
Total Protein: 5.1 g/dL — ABNORMAL LOW (ref 6.5–8.1)

## 2024-01-10 LAB — CBC
HCT: 30.1 % — ABNORMAL LOW (ref 39.0–52.0)
Hemoglobin: 9.4 g/dL — ABNORMAL LOW (ref 13.0–17.0)
MCH: 26.7 pg (ref 26.0–34.0)
MCHC: 31.2 g/dL (ref 30.0–36.0)
MCV: 85.5 fL (ref 80.0–100.0)
Platelets: 272 10*3/uL (ref 150–400)
RBC: 3.52 MIL/uL — ABNORMAL LOW (ref 4.22–5.81)
RDW: 15.7 % — ABNORMAL HIGH (ref 11.5–15.5)
WBC: 10.9 10*3/uL — ABNORMAL HIGH (ref 4.0–10.5)
nRBC: 0 % (ref 0.0–0.2)

## 2024-01-10 LAB — LACTIC ACID, PLASMA: Lactic Acid, Venous: 1.1 mmol/L (ref 0.5–1.9)

## 2024-01-10 MED ORDER — HYDROMORPHONE HCL 1 MG/ML IJ SOLN
0.5000 mg | INTRAMUSCULAR | Status: DC | PRN
  Administered 2024-01-10 – 2024-01-11 (×4): 0.5 mg via INTRAVENOUS
  Filled 2024-01-10 (×4): qty 0.5

## 2024-01-10 MED ORDER — DEXTROSE-SODIUM CHLORIDE 5-0.45 % IV SOLN: INTRAVENOUS | Status: AC

## 2024-01-10 MED ORDER — PROCHLORPERAZINE EDISYLATE 10 MG/2ML IJ SOLN
10.0000 mg | INTRAMUSCULAR | Status: DC | PRN
  Administered 2024-01-10 – 2024-01-11 (×3): 10 mg via INTRAVENOUS
  Filled 2024-01-10 (×3): qty 2

## 2024-01-10 MED ORDER — ONDANSETRON HCL 4 MG/2ML IJ SOLN
4.0000 mg | Freq: Four times a day (QID) | INTRAMUSCULAR | Status: DC | PRN
  Administered 2024-01-10 – 2024-01-11 (×2): 4 mg via INTRAVENOUS
  Filled 2024-01-10 (×2): qty 2

## 2024-01-10 NOTE — Progress Notes (Signed)
   01/10/24 1333  TOC Brief Assessment  Insurance and Status Reviewed  Patient has primary care physician Yes  Home environment has been reviewed Home with Spouse  Prior level of function: Independent  Prior/Current Home Services No current home services  Social Drivers of Health Review SDOH reviewed no interventions necessary  Readmission risk has been reviewed Yes  Transition of care needs no transition of care needs at this time   Transition of Care Department [facility]) has reviewed patient and no TOC needs have been identified at this time. We will continue to monitor patient advancement through interdisciplinary progression rounds. If new patient transition needs arise, please place a TOC consult.

## 2024-01-10 NOTE — Plan of Care (Signed)
  Problem: Activity: Goal: Risk for activity intolerance will decrease Outcome: Progressing   Problem: Elimination: Goal: Will not experience complications related to bowel motility Outcome: Progressing Goal: Will not experience complications related to urinary retention Outcome: Progressing   Problem: Pain Managment: Goal: General experience of comfort will improve and/or be controlled Outcome: Progressing

## 2024-01-10 NOTE — Hospital Course (Signed)
 78 y.o. male with medical history significant of CKD, GERD, HLD, HTN, RA.  Presenting w/ progressive ABD pain. Started nearly 2 wks ago. Seen on 12/31/23 and Dx w/ mild diverticulitis and started on Augmentin . Pain is midline and constant. Associated w/ n/v. No Melena, hematochezia, BRBPR.  He was admitted for IV antibiotics due to failing outpatient management with oral antibiotics.

## 2024-01-10 NOTE — Progress Notes (Signed)
 PROGRESS NOTE   [name]  BJY:[id number] DOB: 02/01/1946 DOA: 01/09/2024 PCP: Yvonnie Heritage, NP   Chief Complaint  Patient presents with   Abdominal Pain   Level of care: Med-Surg  Brief Admission History:  78 y.o. male with medical history significant of CKD, GERD, HLD, HTN, RA.  Presenting w/ progressive ABD pain. Started nearly 2 wks ago. Seen on 12/31/23 and Dx w/ mild diverticulitis and started on Augmentin . Pain is midline and constant. Associated w/ n/v. No Melena, hematochezia, BRBPR.  He was admitted for IV antibiotics due to failing outpatient management with oral antibiotics.     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 Baseline - Following BMP in am   Hypothyroid: - continue home dose levothyroxine   Insomnia: - continue Melatonin, Trazodone .    Chronic pain: - continue home tramodol   GERD: - continue PPI   Depression/Anxiety/memory: at baseline Continue Aricept , Lexapro   DVT prophylaxis: enoxaparin  Code Status: Full  Family Communication:  Disposition: anticipating home in 2 days if continues to improve    Consultants:   Procedures:   Antimicrobials:  Metronidazole  5/6>> Ciprofloxacin 5/6>>   Subjective: Pt says that he would like to try clear liquids today.  He is nauseated but not having emesis.  He denies any diarrhea.  Abdominal pain persists but less intense today.  No blood seen in stool.   Objective: Vitals:   01/10/24 0234 01/10/24 0520 01/10/24 0830 01/10/24 1230  BP: (!) 95/58 114/66 (!) 150/71 (!) 120/57  Pulse:  (!) 52 63 (!) 58  Resp: 16 15 18 18   Temp: 98.2 F (36.8 C) 98.3 F (36.8 C) 97.8 F (36.6 C) 98.4 F (36.9 C)  TempSrc: Oral  Oral   SpO2: 98% 95% 100% 93%  Weight:        Intake/Output  Summary (Last 24 hours) at 01/10/2024 1307 Last data filed at 01/09/2024 2030 Gross per 24 hour  Intake 1000 ml  Output --  Net 1000 ml   Filed Weights   01/09/24 2158  Weight: 65.3 kg   Examination:  General exam: Appears calm and comfortable  Respiratory system: Clear to auscultation. Respiratory effort normal. Cardiovascular system: normal S1 & S2 heard. No JVD, murmurs, rubs, gallops or clicks. No pedal edema. Gastrointestinal system: Abdomen is nondistended, soft and nontender. No organomegaly or masses felt. Normal bowel sounds heard. Central nervous system: Alert and oriented. No focal neurological deficits. Extremities: Symmetric 5 x 5 power. Skin: No rashes, lesions or ulcers. Psychiatry: Judgement and insight appear normal. Mood & affect appropriate.   Data Reviewed: I have personally reviewed following labs and imaging studies  CBC: Recent Labs  Lab 01/09/24 1904 01/10/24 0619  WBC 12.8* 10.9*  NEUTROABS 10.7*  --   HGB 11.6* 9.4*  HCT 36.5* 30.1*  MCV 83.7 85.5  PLT 394 272    Basic Metabolic Panel: Recent Labs  Lab 01/09/24 1904 01/10/24 0422  NA 135 137  K 5.2* 4.3  CL 103 107  CO2 23 21*  GLUCOSE 145* 92  BUN 29* 29*  CREATININE 1.78* 1.60*  CALCIUM 8.7* 7.8*  MG 2.1  --     CBG: No results for input(s): "GLUCAP" in the last 168 hours.  No results found for this or any previous visit (  from the past 240 hours).   Radiology Studies: CT ABDOMEN PELVIS WO CONTRAST Result Date: 01/09/2024 CLINICAL DATA:  Acute abdominal pain EXAM: CT ABDOMEN AND PELVIS WITHOUT CONTRAST TECHNIQUE: Multidetector CT imaging of the abdomen and pelvis was performed following the standard protocol without IV contrast. RADIATION DOSE REDUCTION: This exam was performed according to the departmental dose-optimization program which includes automated exposure control, adjustment of the mA and/or kV according to patient size and/or use of iterative reconstruction technique.  COMPARISON:  12/31/2023 FINDINGS: Lower chest: No acute abnormality. Hepatobiliary: No focal liver abnormality is seen. Status post cholecystectomy. No biliary dilatation. Pancreas: Unremarkable. No pancreatic ductal dilatation or surrounding inflammatory changes. Spleen: Normal in size without focal abnormality. Adrenals/Urinary Tract: Adrenal glands are within normal limits. Kidneys demonstrate no renal calculi or obstructive changes bilaterally. Ureters are within normal limits. The bladder is decompressed. Stomach/Bowel: Changes of sigmoid diverticulitis are again seen and stable. No definitive perforation or abscess formation is noted. The more proximal colon appears within normal limits. The appendix is unremarkable. Stomach and small bowel are within normal limits. Vascular/Lymphatic: Atherosclerotic calcifications are noted. Stable ectasia of the infrarenal aorta is noted. No adenopathy is noted. Reproductive: Prostate is unremarkable. Other: No abdominal wall hernia or abnormality. No abdominopelvic ascites. Musculoskeletal: Degenerative change of the lumbar spine with postsurgical change. No acute abnormality is noted. IMPRESSION: Mild sigmoid diverticulitis similar to that seen on the prior exam. No abscess or perforation is noted. Electronically Signed   By: Violeta Grey M.D.   On: 01/09/2024 20:55   DG Abd Acute W/Chest Result Date: 01/09/2024 CLINICAL DATA:  Abdominal pain EXAM: DG ABDOMEN ACUTE WITH 1 VIEW CHEST COMPARISON:  12/31/2023 CT FINDINGS: Cardiac shadow is within normal limits. Lungs are well aerated bilaterally. Aortic calcifications are seen. No bony abnormality is noted. Scattered large and small bowel gas is noted. No free air is seen. No obstructive changes are noted. Postsurgical change in the lumbar spine is noted. Degenerative changes of lumbar spine are seen as well. IMPRESSION: Nonspecific chest and abdomen.  No acute abnormality noted. Electronically Signed   By: Violeta Grey  M.D.   On: 01/09/2024 19:25   Scheduled Meds:  amLODipine   5 mg Oral Daily   aspirin  EC  81 mg Oral Daily   donepezil   5 mg Oral QHS   enoxaparin (LOVENOX) injection  30 mg Subcutaneous Q24H   escitalopram   20 mg Oral QHS   levothyroxine  12.5 mcg Oral Q0600   losartan   100 mg Oral Daily   melatonin  9 mg Oral QHS   pantoprazole   40 mg Oral Daily   propranolol  ER  60 mg Oral Daily   tiZANidine  2 mg Oral QHS   traZODone   50 mg Oral QHS   Continuous Infusions:  ciprofloxacin 400 mg (01/10/24 0846)   dextrose  5 % and 0.45 % NaCl 40 mL/hr at 01/10/24 0843   metroNIDAZOLE  500 mg (01/10/24 1050)    LOS: 1 day   Time spent: 55 mins  Tammy Wickliffe Lincoln Renshaw, MD How to contact the [facility] Attending or Consulting provider 7A - 7P or covering provider during after hours 7P -7A, for this patient?  Check the care team in [facility] and look for a) attending/consulting [facility] provider listed and b) the [facility] team listed Log into [email or web address] to find provider on call.  Locate the [facility] provider you are looking for under [facility] and page to a number that you can be directly reached. If you still have  difficulty reaching the provider, please page the [facility] (Director on Call) for the Hospitalists listed on amion for assistance.  01/10/2024, 1:07 PM

## 2024-01-10 NOTE — Progress Notes (Signed)
 Mobility Specialist Progress Note:    01/10/24 1405  Mobility  Activity Ambulated with assistance to bathroom;Ambulated with assistance in room  Level of Assistance Standby assist, set-up cues, supervision of patient - no hands on  Assistive Device None  Distance Ambulated (ft) 35 ft  Range of Motion/Exercises Active;All extremities  Activity Response Tolerated well  Mobility Referral Yes  Mobility visit 1 Mobility  Mobility Specialist Start Time (ACUTE ONLY) 1350  Mobility Specialist Stop Time (ACUTE ONLY) 1405  Mobility Specialist Time Calculation (min) (ACUTE ONLY) 15 min   Pt received requesting assistance to ambulate. Required SBA to stand and ambulate with no AD. Tolerated well, c/o stomach pain. Left pt supine, all needs met.   Glinda Lapping Mobility Specialist Please contact via Special educational needs teacher or  Rehab office at [phone]

## 2024-01-10 NOTE — Progress Notes (Signed)
 Mobility Specialist Progress Note:    01/10/24 1025  Mobility  Activity Ambulated with assistance in room;Ambulated with assistance to bathroom  Level of Assistance Modified independent, requires aide device or extra time  Assistive Device None  Distance Ambulated (ft) 30 ft  Range of Motion/Exercises Active;All extremities  Activity Response Tolerated well  Mobility Referral Yes  Mobility visit 1 Mobility  Mobility Specialist Start Time (ACUTE ONLY) 1015  Mobility Specialist Stop Time (ACUTE ONLY) 1025  Mobility Specialist Time Calculation (min) (ACUTE ONLY) 10 min   Pt received in bed requesting assistance to bathroom. ModI to stand and ambulate with no AD. Tolerated well, asx throughout. Left pt in bathroom and notified NT, all needs met.  Glinda Lapping Mobility Specialist Please contact via Special educational needs teacher or  Rehab office at [phone]

## 2024-01-10 NOTE — Progress Notes (Signed)
 Nurse at bedside this am,patient alert to self,place and situation,confused to time.Patient ambulated to bathroom and back with no difficulty.Patient c/o abdomen pain rated a 5,ultram  50 mg's given by mouth per [facility] prn,patient also having some nausea and vomiting this am,Dr Lincoln Renshaw notified. Plan of care on going.

## 2024-01-11 ENCOUNTER — Encounter (HOSPITAL_COMMUNITY): Payer: Self-pay | Admitting: Family Medicine

## 2024-01-11 ENCOUNTER — Other Ambulatory Visit: Payer: Self-pay

## 2024-01-11 DIAGNOSIS — R627 Adult failure to thrive: Secondary | ICD-10-CM | POA: Diagnosis not present

## 2024-01-11 DIAGNOSIS — K5792 Diverticulitis of intestine, part unspecified, without perforation or abscess without bleeding: Secondary | ICD-10-CM | POA: Diagnosis not present

## 2024-01-11 DIAGNOSIS — Z515 Encounter for palliative care: Secondary | ICD-10-CM

## 2024-01-11 DIAGNOSIS — Z66 Do not resuscitate: Secondary | ICD-10-CM | POA: Diagnosis not present

## 2024-01-11 DIAGNOSIS — Z7189 Other specified counseling: Secondary | ICD-10-CM

## 2024-01-11 LAB — CBC WITH DIFFERENTIAL/PLATELET
Abs Immature Granulocytes: 0.06 10*3/uL (ref 0.00–0.07)
Basophils Absolute: 0 10*3/uL (ref 0.0–0.1)
Basophils Relative: 0 %
Eosinophils Absolute: 0.1 10*3/uL (ref 0.0–0.5)
Eosinophils Relative: 2 %
HCT: 28.5 % — ABNORMAL LOW (ref 39.0–52.0)
Hemoglobin: 8.7 g/dL — ABNORMAL LOW (ref 13.0–17.0)
Immature Granulocytes: 1 %
Lymphocytes Relative: 35 %
Lymphs Abs: 3.1 10*3/uL (ref 0.7–4.0)
MCH: 25.7 pg — ABNORMAL LOW (ref 26.0–34.0)
MCHC: 30.5 g/dL (ref 30.0–36.0)
MCV: 84.1 fL (ref 80.0–100.0)
Monocytes Absolute: 0.6 10*3/uL (ref 0.1–1.0)
Monocytes Relative: 6 %
Neutro Abs: 5 10*3/uL (ref 1.7–7.7)
Neutrophils Relative %: 56 %
Platelets: 259 10*3/uL (ref 150–400)
RBC: 3.39 MIL/uL — ABNORMAL LOW (ref 4.22–5.81)
RDW: 15.7 % — ABNORMAL HIGH (ref 11.5–15.5)
WBC: 8.9 10*3/uL (ref 4.0–10.5)
nRBC: 0 % (ref 0.0–0.2)

## 2024-01-11 LAB — BASIC METABOLIC PANEL WITH GFR
Anion gap: 6 (ref 5–15)
BUN: 24 mg/dL — ABNORMAL HIGH (ref 8–23)
CO2: 24 mmol/L (ref 22–32)
Calcium: 7.8 mg/dL — ABNORMAL LOW (ref 8.9–10.3)
Chloride: 105 mmol/L (ref 98–111)
Creatinine, Ser: 1.97 mg/dL — ABNORMAL HIGH (ref 0.61–1.24)
GFR, Estimated: 34 mL/min — ABNORMAL LOW (ref >60)
Glucose, Bld: 80 mg/dL (ref 70–99)
Potassium: 3.6 mmol/L (ref 3.5–5.1)
Sodium: 135 mmol/L (ref 135–145)

## 2024-01-11 MED ORDER — DEXTROSE-SODIUM CHLORIDE 5-0.45 % IV SOLN: INTRAVENOUS | Status: DC

## 2024-01-11 MED ORDER — PREDNISONE 20 MG PO TABS
10.0000 mg | ORAL_TABLET | Freq: Every day | ORAL | Status: DC
  Administered 2024-01-11 – 2024-01-13 (×3): 10 mg via ORAL
  Filled 2024-01-11 (×3): qty 1

## 2024-01-11 MED ORDER — PRAVASTATIN SODIUM 40 MG PO TABS
40.0000 mg | ORAL_TABLET | Freq: Every day | ORAL | Status: DC
  Administered 2024-01-11 – 2024-01-12 (×2): 40 mg via ORAL
  Filled 2024-01-11 (×2): qty 1

## 2024-01-11 NOTE — Plan of Care (Signed)
  Problem: Activity: Goal: Risk for activity intolerance will decrease Outcome: Not Met (add Reason)   Problem: Elimination: Goal: Will not experience complications related to bowel motility Outcome: Not Met (add Reason) Goal: Will not experience complications related to urinary retention Outcome: Progressing   Problem: Pain Managment: Goal: General experience of comfort will improve and/or be controlled Outcome: Not Met (add Reason)

## 2024-01-11 NOTE — TOC Progression Note (Signed)
 Transition of Care [facility]) - Progression Note    Patient Details  Name: [name] MRN: [id number] Date of Birth: 08/23/1946  Transition of Care [facility]) CM/SW Contact  Geraldina Klinefelter, RN Phone Number: 01/11/2024, 4:28 PM  Clinical Narrative:    Palliative consult completed. Family agrees to outpatient palliative services. Referral sent to [facility].    Expected Discharge Plan and Services   Social Determinants of Health (SDOH) Interventions SDOH Screenings   Food Insecurity: No Food Insecurity (01/09/2024)  Housing: High Risk (01/09/2024)  Transportation Needs: No Transportation Needs (01/09/2024)  Utilities: Not At Risk (01/09/2024)  Social Connections: Moderately Isolated (01/11/2024)  Tobacco Use: Medium Risk (01/11/2024)    Readmission Risk Interventions    01/13/2022    4:39 PM  Readmission Risk Prevention Plan  Transportation Screening Complete  PCP or Specialist Appt within 5-7 Days Not Complete  Home Care Screening Complete  Medication Review (RN CM) Complete

## 2024-01-11 NOTE — Consult Note (Signed)
 Consultation Note Date: 01/11/2024   Patient Name: [name]  DOB: 12-22-1945  MRN: [id number]  Age / Sex: 78 y.o., male  PCP: Christian Heritage, NP Referring Physician: Rayfield Cairo, MD  Reason for Consultation: Establishing goals of care  HPI/Patient Profile: 78 y.o. male  with past medical history of CKD, GERD, HLD, HTN, RA  admitted on 01/09/2024 with acute sigmoid diverticulitis.   Clinical Assessment and Goals of Care: I have reviewed medical records including EPIC notes, labs and imaging, received report from RN, assessed the patient.  [name] is seen exiting the bathroom in his room headed toward the bed.  He immediately lies down on the bed.  He briefly makes but does not keep eye contact after my initial greeting.  He is alert and oriented x 3, able to make his needs known.  I share that I am there to be an extra layer of support, answer questions.  [name] states that he cannot have a meeting/discussion with me at this time as he feels acutely ill.  He is agreeable to have me call his wife, [name].    Call to wife, [name], to discuss diagnosis prognosis, GOC, EOL wishes, disposition and options.  I introduced Palliative Medicine as specialized medical care for people living with serious illness. It focuses on providing relief from the symptoms and stress of a serious illness. The goal is to improve quality of life for both the patient and the family.  We discussed a brief life review of the patient.  Mr. and Mrs. Sparks have been married since approximately 1988.  Wife shares that he no longer finds enjoyment in activities that he did before as he is short of breath and unable to participate in those activities.  She states that he is also experiencing memory loss.   We focused on their current illness.  We talked about diverticulitis and the treatment plan.  We talked about  time for outcomes.  We talked about rheumatoid arthritis and his rheumatologist.  [name] states that her husband would not take physical therapy, even at home.  The natural disease trajectory and expectations at EOL were discussed.  Advanced directives, concepts specific to code status, artifical feeding and hydration, and rehospitalization were considered and discussed.  DNR verified.  Palliative Care services outpatient were explained and offered.  Wife is agreeable to outpatient palliative services for further support.  Provider of choice [facility].  TOC team updated  Discussed the importance of continued conversation with family and the medical providers regarding overall plan of care and treatment options, ensuring decisions are within the context of the patient's values and GOCs.  Questions and concerns were addressed.  Hard Choices booklet left for review. The family was encouraged to call with questions or concerns.  PMT will continue to support holistically.   HCPOA  NEXT OF KIN -wife, [name]    SUMMARY OF RECOMMENDATIONS   Continue to treat the treatable but no CPR or intubation Time for outcomes Home when  stable Outpatient palliative services with [facility]   Code Status/Advance Care Planning: DNR  Symptom Management:  Per hospitalist, no additional needs at this time.  Palliative Prophylaxis:  Frequent Pain Assessment and Oral Care  Additional Recommendations (Limitations, Scope, Preferences): Continue to treat, time for outcomes, no CPR or intubation  Psycho-social/Spiritual:  Desire for further Chaplaincy support:no Additional Recommendations: Caregiving  Support/Resources and Education on Hospice  Prognosis:  Unable to determine, based on outcomes.  1 year or more would not be surprising based on limited chronic illness, in particular if we can find cause for GI discomfort  Discharge Planning: Home with [facility] palliative      Primary Diagnoses: Present  on Admission:  Diverticulitis   I have reviewed the medical record, interviewed the patient and family, and examined the patient. The following aspects are pertinent.  Past Medical History:  Diagnosis Date   Anxiety and depression    Chronic renal insufficiency, stage 3 (moderate) (HCC)    DDD (degenerative disc disease), lumbar    remote hx of fusion surgery   Dysrhythmia    isolated afib/flutter   GERD (gastroesophageal reflux disease)    Gout    History of stomach ulcers 2000   Hyperlipidemia    Hypertension    NAUSEA WITH VOMITING 10/15/2010   Rheumatoid arthritis (HCC)    Rheumatoid    Social History   Socioeconomic History   Marital status: Married    Spouse name: Statistician   Number of children: 1   Years of education: Not on file   Highest education level: Not on file  Occupational History   Not on file  Tobacco Use   Smoking status: Former    Current packs/day: 0.00    Average packs/day: 2.0 packs/day for 20.0 years (40.0 ttl pk-yrs)    Types: Cigarettes    Start date: 18    Quit date: 1980    Years since quitting: 45.3   Smokeless tobacco: Never   Tobacco comments:    Stopped early 67s  Vaping Use   Vaping status: Never Used  Substance and Sexual Activity   Alcohol use: No   Drug use: No   Sexual activity: Yes  Other Topics Concern   Not on file  Social History Narrative   Lives at home with spouse   Right handed   Caffeine : 2 cups/day, tea seldom   Social Drivers of Corporate investment banker Strain: Not on file  Food Insecurity: No Food Insecurity (01/09/2024)   Hunger Vital Sign    Worried About Running Out of Food in the Last Year: Never true    Ran Out of Food in the Last Year: Never true  Transportation Needs: No Transportation Needs (01/09/2024)   PRAPARE - Administrator, Civil Service (Medical): No    Lack of Transportation (Non-Medical): No  Physical Activity: Not on file  Stress: Not on file  Social Connections:  Moderately Isolated (01/11/2024)   Social Connection and Isolation Panel [NHANES]    Frequency of Communication with Friends and Family: More than three times a week    Frequency of Social Gatherings with Friends and Family: More than three times a week    Attends Religious Services: Never    Database administrator or Organizations: No    Attends Banker Meetings: Never    Marital Status: Married   Family History  Problem Relation Age of Onset   Cancer Mother    CVA Mother  Cancer Maternal Grandmother    Healthy Son    Colon cancer Neg Hx    Scheduled Meds:  amLODipine   5 mg Oral Daily   aspirin  EC  81 mg Oral Daily   donepezil   5 mg Oral QHS   enoxaparin (LOVENOX) injection  30 mg Subcutaneous Q24H   escitalopram   20 mg Oral QHS   levothyroxine  12.5 mcg Oral Q0600   melatonin  9 mg Oral QHS   pantoprazole   40 mg Oral Daily   pravastatin   40 mg Oral q1800   predniSONE   10 mg Oral Daily   propranolol  ER  60 mg Oral Daily   traZODone   50 mg Oral QHS   Continuous Infusions:  ciprofloxacin 400 mg (01/11/24 0807)   dextrose  5 % and 0.45 % NaCl 70 mL/hr at 01/11/24 1004   metroNIDAZOLE  500 mg (01/11/24 1011)   PRN Meds:.HYDROmorphone  (DILAUDID ) injection, ondansetron  (ZOFRAN ) IV, prochlorperazine , traMADol  Medications Prior to Admission:  Prior to Admission medications   Medication Sig Start Date End Date Taking? Authorizing Provider  acetaminophen  (TYLENOL ) 500 MG tablet Take 1 tablet (500 mg total) by mouth every 6 (six) hours as needed for mild pain or headache. 12/28/21  Yes Sheikh, Omair Latif, DO  alendronate (FOSAMAX) 70 MG tablet Take 70 mg by mouth once a week. 12/21/23  Yes [provider]  amLODipine  (NORVASC ) 5 MG tablet Take 1 tablet (5 mg total) by mouth daily. 11/21/23 11/15/24 Yes BranchJoyceann No, MD  amoxicillin -clavulanate (AUGMENTIN ) 875-125 MG tablet Take 1 tablet by mouth every 12 (twelve) hours. 12/31/23  Yes Sueellen Emery, MD   aspirin  EC 81 MG tablet Take 81 mg by mouth daily. Swallow whole.   Yes [provider]  donepezil  (ARICEPT ) 5 MG tablet TAKE 1 TABLET BY MOUTH EVERYDAY AT BEDTIME 01/08/24  Yes Glory Larsen, MD  escitalopram  (LEXAPRO ) 20 MG tablet Take 20 mg by mouth at bedtime.    Yes [provider]  levothyroxine (SYNTHROID) 25 MCG tablet Take 12.5 mcg by mouth daily before breakfast.   Yes [provider]  losartan  (COZAAR ) 100 MG tablet Take 100 mg by mouth daily.   Yes [provider]  Melatonin 10 MG TABS Take 10 mg by mouth at bedtime.   Yes [provider]  meloxicam (MOBIC) 15 MG tablet Take 15 mg by mouth daily as needed for pain. 12/19/23  Yes [provider]  omeprazole (PRILOSEC) 40 MG capsule Take 40 mg by mouth daily. 30 minutes before a meal   Yes [provider]  ondansetron  (ZOFRAN -ODT) 4 MG disintegrating tablet Take 1 tablet (4 mg total) by mouth every 8 (eight) hours as needed for nausea or vomiting. 12/07/23  Yes Mordecai Applebaum, MD  pravastatin  (PRAVACHOL ) 40 MG tablet Take 40 mg by mouth daily. 04/15/21  Yes [provider]  predniSONE  (DELTASONE ) 10 MG tablet Take 10 mg by mouth daily. 12/31/23  Yes [provider]  propranolol  ER (INDERAL  LA) 60 MG 24 hr capsule TAKE 1 CAPSULE DAILY-CALL & SCHEDULE APPOINTMENT FOR MARCH 2024 OR SOONER IF SYMPTOMS CHANGE 07/06/23  Yes McCue, Camilo Cella, NP  tiZANidine (ZANAFLEX) 2 MG tablet Take 2 mg by mouth at bedtime. 05/23/23  Yes [provider]  traMADol  (ULTRAM ) 50 MG tablet Take 50 mg by mouth 3 (three) times daily as needed for moderate pain (pain score 4-6). 04/04/23  Yes [provider]  traZODone  (DESYREL ) 50 MG tablet Take 50 mg by mouth at bedtime. 02/13/21  Yes [provider]   No Known Allergies Review of Systems  Unable to perform ROS: Age    Physical Exam Vitals and nursing note reviewed.  Constitutional:      General: He is in  acute distress.     Appearance: He is ill-appearing.  Neurological:     Mental Status: He is alert and oriented to person, place, and time.     Vital Signs: BP 124/61   Pulse 64   Temp 98.4 F (36.9 C)   Resp 16   Wt 65.3 kg   SpO2 94%   BMI 22.55 kg/m  Pain Scale: 0-10   Pain Score: 6    SpO2: SpO2: 94 % O2 Device:SpO2: 94 % O2 Flow Rate: .O2 Flow Rate (L/min): 0 L/min  IO: Intake/output summary:  Intake/Output Summary (Last 24 hours) at 01/11/2024 1012 Last data filed at 01/11/2024 0620 Gross per 24 hour  Intake 240 ml  Output --  Net 240 ml    LBM: Last BM Date : 01/09/24 Baseline Weight: Weight: 65.3 kg Most recent weight: Weight: 65.3 kg     Palliative Assessment/Data:     Time In: 1300 Time Out: 1415 Time Total: 75 minutes  Greater than 50%  of this time was spent counseling and coordinating care related to the above assessment and plan.  Signed by: Annabelle Barrack, NP   Please contact Palliative Medicine Team phone at [phone] for questions and concerns.  For individual provider: See Tilford Foley

## 2024-01-11 NOTE — Plan of Care (Signed)
  Problem: Activity: Goal: Risk for activity intolerance will decrease Outcome: Progressing   Problem: Elimination: Goal: Will not experience complications related to bowel motility Outcome: Progressing Goal: Will not experience complications related to urinary retention Outcome: Progressing   Problem: Pain Managment: Goal: General experience of comfort will improve and/or be controlled Outcome: Progressing

## 2024-01-11 NOTE — Progress Notes (Signed)
 Pt's wife Dontreal Taute calls and states that pt has a Living Will and that he has always stated that he does not want to be resuscitated if his heart stopped or he quit breathing. I advised her to bring the forms here today if possible. She stated that she would and asked me to relay the no resuscitation request to [facility].

## 2024-01-11 NOTE — Progress Notes (Signed)
 PROGRESS NOTE   [name]  ZOX:[id number] DOB: March 01, 1946 DOA: 01/09/2024 PCP: Yvonnie Heritage, NP   Chief Complaint  Patient presents with   Abdominal Pain   Level of care: Med-Surg  Brief Admission History:  78 y.o. male with medical history significant of CKD, GERD, HLD, HTN, RA.  Presenting w/ progressive ABD pain. Started nearly 2 wks ago. Seen on 12/31/23 and Dx w/ mild diverticulitis and started on Augmentin . Pain is midline and constant. Associated w/ n/v. No Melena, hematochezia, BRBPR.  He was admitted for IV antibiotics due to failing outpatient management with oral antibiotics.     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 Baseline - Following BMP in am   Adult failure to thrive - wife reports he has progressively declined over last few months - palliative medicine consult requested - wife reports he is DNR/DNI  DNR present on admission - continue DNR order while in hospital   Hypothyroid: - continue home dose levothyroxine   Insomnia: - continue Melatonin, Trazodone .    Chronic pain: - continue home tramodol   GERD: - continue PPI   Depression/Anxiety/memory: at baseline Vascular dementia and question of alzheimer's disease  Continue Aricept , Lexapro  Pt has been followed by neurologist Dr. Tresia Fruit for testing/follow up  DVT prophylaxis: enoxaparin  Code Status: DNR  Family Communication: wife updated phone 01/11/24  Disposition: anticipating SNF placement in 1-2 days    Consultants:   Procedures:   Antimicrobials:  Metronidazole  5/6>> Ciprofloxacin 5/6>>   Subjective: Pt not eating or drinking well today.  Wife says he has been depressed and not wanting to participate with doing much of  anything anymore.    Objective: Vitals:   01/10/24 2009 01/11/24 0334 01/11/24 1004 01/11/24 1307  BP: (!) 106/51 (!) 100/52 124/61 (!) 119/50  Pulse: (!) 53 (!) 58 64 72  Resp: 15 16  12   Temp: 98.2 F (36.8 C) 98.4 F (36.9 C)  98.9 F (37.2 C)  TempSrc:    Oral  SpO2: 96% 93% 94% 91%  Weight:        Intake/Output Summary (Last 24 hours) at 01/11/2024 1750 Last data filed at 01/11/2024 1504 Gross per 24 hour  Intake 1419.22 ml  Output --  Net 1419.22 ml   Filed Weights   01/09/24 2158  Weight: 65.3 kg   Examination:  General exam: Appears calm and comfortable  Respiratory system: Clear to auscultation. Respiratory effort normal. Cardiovascular system: normal S1 & S2 heard. No JVD, murmurs, rubs, gallops or clicks. No pedal edema. Gastrointestinal system: Abdomen is nondistended, soft and nontender. No organomegaly or masses felt. Normal bowel sounds heard. Central nervous system: Alert and oriented. No focal neurological deficits. Extremities: Symmetric 5 x 5 power. Skin: No rashes, lesions or ulcers. Psychiatry: Judgement and insight appear normal. Mood & affect appropriate.   Data Reviewed: I have personally reviewed following labs and imaging studies  CBC: Recent Labs  Lab 01/09/24 1904 01/10/24 0619 01/11/24 0407  WBC 12.8* 10.9* 8.9  NEUTROABS 10.7*  --  5.0  HGB 11.6* 9.4* 8.7*  HCT 36.5* 30.1* 28.5*  MCV 83.7 85.5 84.1  PLT 394 272 259    Basic Metabolic Panel: Recent Labs  Lab 01/09/24 1904 01/10/24 0422 01/11/24 0407  NA 135 137 135  K 5.2* 4.3 3.6  CL 103 107 105  CO2 23 21* 24  GLUCOSE 145* 92 80  BUN 29* 29* 24*  CREATININE 1.78* 1.60* 1.97*  CALCIUM 8.7* 7.8* 7.8*  MG 2.1  --   --     CBG: No results for input(s): "GLUCAP" in the last 168 hours.  No results found for this or any previous visit (from the past 240 hours).   Radiology Studies: CT ABDOMEN PELVIS WO CONTRAST Result Date: 01/09/2024 CLINICAL DATA:  Acute abdominal  pain EXAM: CT ABDOMEN AND PELVIS WITHOUT CONTRAST TECHNIQUE: Multidetector CT imaging of the abdomen and pelvis was performed following the standard protocol without IV contrast. RADIATION DOSE REDUCTION: This exam was performed according to the departmental dose-optimization program which includes automated exposure control, adjustment of the mA and/or kV according to patient size and/or use of iterative reconstruction technique. COMPARISON:  12/31/2023 FINDINGS: Lower chest: No acute abnormality. Hepatobiliary: No focal liver abnormality is seen. Status post cholecystectomy. No biliary dilatation. Pancreas: Unremarkable. No pancreatic ductal dilatation or surrounding inflammatory changes. Spleen: Normal in size without focal abnormality. Adrenals/Urinary Tract: Adrenal glands are within normal limits. Kidneys demonstrate no renal calculi or obstructive changes bilaterally. Ureters are within normal limits. The bladder is decompressed. Stomach/Bowel: Changes of sigmoid diverticulitis are again seen and stable. No definitive perforation or abscess formation is noted. The more proximal colon appears within normal limits. The appendix is unremarkable. Stomach and small bowel are within normal limits. Vascular/Lymphatic: Atherosclerotic calcifications are noted. Stable ectasia of the infrarenal aorta is noted. No adenopathy is noted. Reproductive: Prostate is unremarkable. Other: No abdominal wall hernia or abnormality. No abdominopelvic ascites. Musculoskeletal: Degenerative change of the lumbar spine with postsurgical change. No acute abnormality is noted. IMPRESSION: Mild sigmoid diverticulitis similar to that seen on the prior exam. No abscess or perforation is noted. Electronically Signed   By: Violeta Grey M.D.   On: 01/09/2024 20:55   DG Abd Acute W/Chest Result Date: 01/09/2024 CLINICAL DATA:  Abdominal pain EXAM: DG ABDOMEN ACUTE WITH 1 VIEW CHEST COMPARISON:  12/31/2023 CT FINDINGS: Cardiac shadow is within  normal limits. Lungs are well aerated bilaterally. Aortic calcifications are seen. No bony abnormality is noted. Scattered large and small bowel gas is noted. No free air is seen. No obstructive changes are noted. Postsurgical change in the lumbar spine is noted. Degenerative changes of lumbar spine are seen as well. IMPRESSION: Nonspecific chest and abdomen.  No acute abnormality noted. Electronically Signed   By: Violeta Grey M.D.   On: 01/09/2024 19:25   Scheduled Meds:  amLODipine   5 mg Oral Daily   aspirin  EC  81 mg Oral Daily   donepezil   5 mg Oral QHS   enoxaparin (LOVENOX) injection  30 mg Subcutaneous Q24H   escitalopram   20 mg Oral QHS   levothyroxine  12.5 mcg Oral Q0600   melatonin  9 mg Oral QHS   pantoprazole   40 mg Oral Daily   pravastatin   40 mg Oral q1800   predniSONE   10 mg Oral Daily   propranolol  ER  60 mg Oral Daily   traZODone   50 mg Oral QHS   Continuous Infusions:  ciprofloxacin Stopped (01/11/24 0907)   dextrose  5 % and 0.45 % NaCl 70 mL/hr at 01/11/24 1504   metroNIDAZOLE  Stopped (01/11/24 1112)    LOS: 2 days   Time spent: 55 mins  Tearah Saulsbury Lincoln Renshaw, MD How to contact the [facility] Attending or Consulting provider  7A - 7P or covering provider during after hours 7P -7A, for this patient?  Check the care team in [facility] and look for a) attending/consulting TRH provider listed and b) the TRH team listed Log into [email or web address] to find provider on call.  Locate the TRH provider you are looking for under [facility] and page to a number that you can be directly reached. If you still have difficulty reaching the provider, please page the [facility] (Director on Call) for the Hospitalists listed on amion for assistance.  01/11/2024, 5:50 PM

## 2024-01-12 ENCOUNTER — Encounter (HOSPITAL_COMMUNITY): Payer: Self-pay | Admitting: Family Medicine

## 2024-01-12 DIAGNOSIS — R627 Adult failure to thrive: Secondary | ICD-10-CM | POA: Diagnosis not present

## 2024-01-12 DIAGNOSIS — K5792 Diverticulitis of intestine, part unspecified, without perforation or abscess without bleeding: Secondary | ICD-10-CM | POA: Diagnosis not present

## 2024-01-12 DIAGNOSIS — Z66 Do not resuscitate: Secondary | ICD-10-CM | POA: Diagnosis not present

## 2024-01-12 LAB — BASIC METABOLIC PANEL WITH GFR
Anion gap: 6 (ref 5–15)
BUN: 19 mg/dL (ref 8–23)
CO2: 25 mmol/L (ref 22–32)
Calcium: 8 mg/dL — ABNORMAL LOW (ref 8.9–10.3)
Chloride: 106 mmol/L (ref 98–111)
Creatinine, Ser: 1.83 mg/dL — ABNORMAL HIGH (ref 0.61–1.24)
GFR, Estimated: 37 mL/min — ABNORMAL LOW (ref >60)
Glucose, Bld: 135 mg/dL — ABNORMAL HIGH (ref 70–99)
Potassium: 3.8 mmol/L (ref 3.5–5.1)
Sodium: 137 mmol/L (ref 135–145)

## 2024-01-12 MED ORDER — HYDROMORPHONE HCL 1 MG/ML IJ SOLN: 0.2500 mg | INTRAMUSCULAR | Status: DC | PRN

## 2024-01-12 NOTE — Plan of Care (Signed)
  Problem: Activity: Goal: Risk for activity intolerance will decrease Outcome: Progressing   Problem: Elimination: Goal: Will not experience complications related to bowel motility Outcome: Progressing Goal: Will not experience complications related to urinary retention Outcome: Progressing   Problem: Pain Managment: Goal: General experience of comfort will improve and/or be controlled Outcome: Progressing

## 2024-01-12 NOTE — Plan of Care (Signed)
°  Problem: Activity: °Goal: Risk for activity intolerance will decrease °Outcome: Progressing °  °Problem: Elimination: °Goal: Will not experience complications related to bowel motility °Outcome: Progressing °Goal: Will not experience complications related to urinary retention °Outcome: Progressing °  °

## 2024-01-12 NOTE — Progress Notes (Signed)
 PROGRESS NOTE   [name]  ZOX:[id number] DOB: [birth year] DOA: 01/09/2024 PCP: Yvonnie Heritage, NP   Chief Complaint  Patient presents with   Abdominal Pain   Level of care: Med-Surg  Brief Admission History:  78 y.o. male with medical history significant of CKD, GERD, HLD, HTN, RA.  Presenting w/ progressive ABD pain. Started nearly 2 wks ago. Seen on 12/31/23 and Dx w/ mild diverticulitis and started on Augmentin . Pain is midline and constant. Associated w/ n/v. No Melena, hematochezia, BRBPR.  He was admitted for IV antibiotics due to failing outpatient management with oral antibiotics.     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    Chronic pain: - continue home tramadol  as needed   GERD: - continue PPI   Depression/Anxiety/memory: at baseline Vascular dementia and question of alzheimer's disease  Continue Aricept , Lexapro  Pt has been followed by neurologist Dr. Tresia Fruit for testing/follow up  DVT prophylaxis: enoxaparin   Code Status: DNR  Family Communication: wife updated phone 01/11/24  Disposition: anticipating SNF placement when bed  available     Consultants:  Palliative care  Procedures:   Antimicrobials:  Metronidazole  5/6>> Ciprofloxacin  5/6>>   Subjective: Pt reports that he feels he can eat breakfast today.  He also says that his abdominal pain is much improved.  He says he would be willing to participate with acute rehab therapy.    Objective: Vitals:   01/11/24 1004 01/11/24 1307 01/11/24 2003 01/12/24 0553  BP: 124/61 (!) 119/50 129/65 (!) 149/69  Pulse: 64 72 60 (!) 55  Resp:  12 16 17   Temp:  98.9 F (37.2 C) 97.7 F (36.5 C) 97.8 F (36.6 C)  TempSrc:  Oral Oral   SpO2: 94% 91% 96% 97%  Weight:      Height:   5\' 7"  (1.702 m)     Intake/Output Summary (Last 24 hours) at 01/12/2024 0916 Last data filed at 01/11/2024 1504 Gross per 24 hour  Intake 1179.22 ml  Output --  Net 1179.22 ml   Filed Weights   01/09/24 2158  Weight: 65.3 kg   Examination:  General exam: elderly frail gentleman, with waxing/waning periods of cognitive decline, Appears calm and comfortable  Respiratory system: Clear to auscultation. Respiratory effort normal. Cardiovascular system: normal S1 & S2 heard. No JVD, murmurs, rubs, gallops or clicks. No pedal edema. Gastrointestinal system: Abdomen is nondistended, soft and nontender. No organomegaly or masses felt. Normal bowel sounds heard. Central nervous system: Alert and oriented. No focal neurological deficits. Extremities: Symmetric 5 x 5 power. Skin: No rashes, lesions or ulcers. Psychiatry: Judgement and insight appear diminished. Mood & affect flat.   Data Reviewed: I have personally reviewed  following labs and imaging studies  CBC: Recent Labs  Lab 01/09/24 1904 01/10/24 0619 01/11/24 0407  WBC 12.8* 10.9* 8.9  NEUTROABS 10.7*  --  5.0  HGB 11.6* 9.4* 8.7*  HCT 36.5* 30.1* 28.5*  MCV 83.7 85.5 84.1  PLT 394 272 259    Basic Metabolic Panel: Recent Labs  Lab 01/09/24 1904 01/10/24 0422 01/11/24 0407 01/12/24 0336  NA 135 137 135 137  K  5.2* 4.3 3.6 3.8  CL 103 107 105 106  CO2 23 21* 24 25  GLUCOSE 145* 92 80 135*  BUN 29* 29* 24* 19  CREATININE 1.78* 1.60* 1.97* 1.83*  CALCIUM 8.7* 7.8* 7.8* 8.0*  MG 2.1  --   --   --    CBG: No results for input(s): "GLUCAP" in the last 168 hours.  No results found for this or any previous visit (from the past 240 hours).   Radiology Studies: No results found.  Scheduled Meds:  amLODipine   5 mg Oral Daily   aspirin  EC  81 mg Oral Daily   donepezil   5 mg Oral QHS   enoxaparin  (LOVENOX ) injection  30 mg Subcutaneous Q24H   escitalopram   20 mg Oral QHS   levothyroxine   12.5 mcg Oral Q0600   melatonin  9 mg Oral QHS   pantoprazole   40 mg Oral Daily   pravastatin   40 mg Oral q1800   predniSONE   10 mg Oral Daily   propranolol  ER  60 mg Oral Daily   traZODone   50 mg Oral QHS   Continuous Infusions:  ciprofloxacin  400 mg (01/11/24 2300)   dextrose  5 % and 0.45 % NaCl 70 mL/hr at 01/11/24 2320   metroNIDAZOLE  500 mg (01/11/24 2105)    LOS: 3 days   Time spent: 55 mins  Jeramy Dimmick Lincoln Renshaw, MD How to contact the [facility] Attending or Consulting provider 7A - 7P or covering provider during after hours 7P -7A, for this patient?  Check the care team in [facility] and look for a) attending/consulting TRH provider listed and b) the TRH team listed Log into [email or web address] to find provider on call.  Locate the [facility] provider you are looking for under [facility] and page to a number that you can be directly reached. If you still have difficulty reaching the provider, please page the [facility] (Director on Call) for the Hospitalists listed on amion for assistance.  01/12/2024, 9:16 AM

## 2024-01-12 NOTE — Plan of Care (Signed)
  Problem: Activity: Goal: Risk for activity intolerance will decrease Outcome: Progressing   Problem: Pain Managment: Goal: General experience of comfort will improve and/or be controlled Outcome: Progressing

## 2024-01-12 NOTE — Progress Notes (Signed)
 Mobility Specialist Progress Note:    01/12/24 1200  Mobility  Activity Ambulated with assistance in hallway  Level of Assistance Standby assist, set-up cues, supervision of patient - no hands on  Assistive Device None  Distance Ambulated (ft) 100 ft  Range of Motion/Exercises Active;All extremities  Activity Response Tolerated well  Mobility Referral Yes  Mobility visit 1 Mobility  Mobility Specialist Start Time (ACUTE ONLY) 1200  Mobility Specialist Stop Time (ACUTE ONLY) 1220  Mobility Specialist Time Calculation (min) (ACUTE ONLY) 20 min   Pt received in bed, agreeable to mobility. Required supervision to stand and ambulate with no AD. Tolerated well, asx throughout. Left pt sitting EOB, alarm on. All needs met.  Jalesia Loudenslager Mobility Specialist Please contact via Special educational needs teacher or  Rehab office at [phone]

## 2024-01-12 NOTE — Care Management Important Message (Signed)
 Important Message  Patient Details  Name: [name] MRN: [id number] Date of Birth: 05-08-1946   Important Message Given:  Yes - Medicare IM     [name] [name] 01/12/2024, 2:50 PM

## 2024-01-12 NOTE — Evaluation (Signed)
 Physical Therapy Evaluation Patient Details Name: [name] MRN: [id number] DOB: [birth year] Today's Date: 01/12/2024  History of Present Illness  [name] is a 78 y.o. male with medical history significant of CKD, GERD, HLD, HTN, RA.   Presenting w/ progressive ABD pain. Started nearly 2 wks ago. Seen on 12/31/23 and Dx w/ mild diverticulitis and started on Augmentin . Pain is midline and constant. Associated w/ n/v. No Melena, hematochezia, BRBPR.   Clinical Impression  Patient functioning near baseline for functional mobility and gait demonstrating fair/good return for ambulating in room, hallway without use of an AD, but has to frequently lean on side rails for support, no loss of balance and limited mostly due to fatigue. Plan:  Patient discharged from physical therapy to care of nursing for ambulation daily as tolerated for length of stay.          If plan is discharge home, recommend the following: Help with stairs or ramp for entrance   Can travel by private vehicle        Equipment Recommendations None recommended by PT  Recommendations for Other Services       Functional Status Assessment Patient has had a recent decline in their functional status and/or demonstrates limited ability to make significant improvements in function in a reasonable and predictable amount of time     Precautions / Restrictions Precautions Precautions: Fall Restrictions Weight Bearing Restrictions Per Provider Order: No      Mobility  Bed Mobility Overal bed mobility: Independent                  Transfers Overall transfer level: Modified independent                      Ambulation/Gait Ambulation/Gait assistance: Modified independent (Device/Increase time) Gait Distance (Feet): 80 Feet Assistive device: None Gait Pattern/deviations: Decreased step length - right, Decreased step length - left, Decreased stride length Gait velocity: decreased     General  Gait Details: slightly labored movement with  frequent leaning on side rails for support, no loss of balance and limited mostly due to fatigue  Stairs            Wheelchair Mobility     Tilt Bed    Modified Rankin (Stroke Patients Only)       Balance Overall balance assessment: Mild deficits observed, not formally tested                                           Pertinent Vitals/Pain Pain Assessment Pain Assessment: 0-10 Pain Score: 5  Pain Location: stomach Pain Descriptors / Indicators: Aching Pain Intervention(s): Monitored during session    Home Living Family/patient expects to be discharged to:: Private residence Living Arrangements: Spouse/significant other Available Help at Discharge: Family;Available PRN/intermittently Type of Home: House Home Access: Stairs to enter Entrance Stairs-Rails: Right Entrance Stairs-Number of Steps: 3-4   Home Layout: One level Home Equipment: Agricultural consultant (2 wheels);Cane - single point;Shower seat      Prior Function Prior Level of Function : Independent/Modified Independent;Driving             Mobility Comments: Tourist information centre manager without AD, drives ADLs Comments: Independent     Extremity/Trunk Assessment   Upper Extremity Assessment Upper Extremity Assessment: Overall WFL for tasks assessed    Lower Extremity Assessment Lower Extremity Assessment: Generalized weakness  Cervical / Trunk Assessment Cervical / Trunk Assessment: Normal  Communication   Communication Communication: No apparent difficulties    Cognition Arousal: Alert Behavior During Therapy: WFL for tasks assessed/performed   PT - Cognitive impairments: No apparent impairments                         Following commands: Intact       Cueing Cueing Techniques: Verbal cues     General Comments      Exercises     Assessment/Plan    PT Assessment Patient does not need any further PT services   PT Problem List         PT Treatment Interventions      PT Goals (Current goals can be found in the Care Plan section)  Acute Rehab PT Goals Patient Stated Goal: return home with family to assist PT Goal Formulation: With patient Time For Goal Achievement: 01/12/24 Potential to Achieve Goals: Good    Frequency       Co-evaluation               AM-PAC PT "6 Clicks" Mobility  Outcome Measure Help needed turning from your back to your side while in a flat bed without using bedrails?: None Help needed moving from lying on your back to sitting on the side of a flat bed without using bedrails?: None Help needed moving to and from a bed to a chair (including a wheelchair)?: None Help needed standing up from a chair using your arms (e.g., wheelchair or bedside chair)?: None Help needed to walk in hospital room?: A Little Help needed climbing 3-5 steps with a railing? : A Little 6 Click Score: 22    End of Session   Activity Tolerance: Patient tolerated treatment well;Patient limited by fatigue Patient left: in chair;with call bell/phone within reach Nurse Communication: Mobility status PT Visit Diagnosis: Unsteadiness on feet (R26.81);Other abnormalities of gait and mobility (R26.89);Muscle weakness (generalized) (M62.81)    Time: 1042-1100 PT Time Calculation (min) (ACUTE ONLY): 18 min   Charges:   PT Evaluation $PT Eval Low Complexity: 1 Low PT Treatments $Therapeutic Activity: 8-22 mins PT General Charges $$ ACUTE PT VISIT: 1 Visit         2:57 PM, 01/12/24 [name], MPT Physical Therapist with [facility] [phone] office [phone] mobile phone

## 2024-01-13 DIAGNOSIS — R627 Adult failure to thrive: Secondary | ICD-10-CM | POA: Diagnosis not present

## 2024-01-13 DIAGNOSIS — K5792 Diverticulitis of intestine, part unspecified, without perforation or abscess without bleeding: Secondary | ICD-10-CM | POA: Diagnosis not present

## 2024-01-13 DIAGNOSIS — Z66 Do not resuscitate: Secondary | ICD-10-CM | POA: Diagnosis not present

## 2024-01-13 LAB — BASIC METABOLIC PANEL WITH GFR
Anion gap: 9 (ref 5–15)
BUN: 20 mg/dL (ref 8–23)
CO2: 24 mmol/L (ref 22–32)
Calcium: 8.3 mg/dL — ABNORMAL LOW (ref 8.9–10.3)
Chloride: 108 mmol/L (ref 98–111)
Creatinine, Ser: 1.59 mg/dL — ABNORMAL HIGH (ref 0.61–1.24)
GFR, Estimated: 44 mL/min — ABNORMAL LOW (ref >60)
Glucose, Bld: 73 mg/dL (ref 70–99)
Potassium: 3.7 mmol/L (ref 3.5–5.1)
Sodium: 141 mmol/L (ref 135–145)

## 2024-01-13 LAB — CBC
HCT: 29.7 % — ABNORMAL LOW (ref 39.0–52.0)
Hemoglobin: 9.3 g/dL — ABNORMAL LOW (ref 13.0–17.0)
MCH: 26.3 pg (ref 26.0–34.0)
MCHC: 31.3 g/dL (ref 30.0–36.0)
MCV: 83.9 fL (ref 80.0–100.0)
Platelets: 370 10*3/uL (ref 150–400)
RBC: 3.54 MIL/uL — ABNORMAL LOW (ref 4.22–5.81)
RDW: 15.5 % (ref 11.5–15.5)
WBC: 8.8 10*3/uL (ref 4.0–10.5)
nRBC: 0 % (ref 0.0–0.2)

## 2024-01-13 LAB — MAGNESIUM: Magnesium: 1.8 mg/dL (ref 1.7–2.4)

## 2024-01-13 MED ORDER — CIPROFLOXACIN HCL 250 MG PO TABS: 250.0000 mg | ORAL_TABLET | Freq: Two times a day (BID) | ORAL | 0 refills | Status: AC

## 2024-01-13 MED ORDER — METRONIDAZOLE 500 MG PO TABS: 500.0000 mg | ORAL_TABLET | Freq: Two times a day (BID) | ORAL | 0 refills | Status: DC

## 2024-01-13 NOTE — Progress Notes (Signed)
 Mobility Specialist Progress Note:    01/13/24 0930  Mobility  Activity Refused mobility   Pt kindly refused mobility,eager for discharge. All needs met.   Glinda Lapping Mobility Specialist Please contact via Special educational needs teacher or  Rehab office at [phone]

## 2024-01-13 NOTE — Discharge Instructions (Signed)
 IMPORTANT INFORMATION: PAY CLOSE ATTENTION   PHYSICIAN DISCHARGE INSTRUCTIONS  Follow with Primary care provider  Yvonnie Heritage, NP  and other consultants as instructed by your Hospitalist Physician  SEEK MEDICAL CARE OR RETURN TO EMERGENCY ROOM IF SYMPTOMS COME BACK, WORSEN OR NEW PROBLEM DEVELOPS   Please note: You were cared for by a hospitalist during your hospital stay. Every effort will be made to forward records to your primary care provider.  You can request that your primary care provider send for your hospital records if they have not received them.  Once you are discharged, your primary care physician will handle any further medical issues. Please note that NO REFILLS for any discharge medications will be authorized once you are discharged, as it is imperative that you return to your primary care physician (or establish a relationship with a primary care physician if you do not have one) for your post hospital discharge needs so that they can reassess your need for medications and monitor your lab values.  Please get a complete blood count and chemistry panel checked by your Primary MD at your next visit, and again as instructed by your Primary MD.  Get Medicines reviewed and adjusted: Please take all your medications with you for your next visit with your Primary MD  Laboratory/radiological data: Please request your Primary MD to go over all hospital tests and procedure/radiological results at the follow up, please ask your primary care provider to get all Hospital records sent to his/her office.  In some cases, they will be blood work, cultures and biopsy results pending at the time of your discharge. Please request that your primary care provider follow up on these results.  If you are diabetic, please bring your blood sugar readings with you to your follow up appointment with primary care.    Please call and make your follow up appointments as soon as possible.    Also Note the  following: If you experience worsening of your admission symptoms, develop shortness of breath, life threatening emergency, suicidal or homicidal thoughts you must seek medical attention immediately by calling 911 or calling your MD immediately  if symptoms less severe.  You must read complete instructions/literature along with all the possible adverse reactions/side effects for all the Medicines you take and that have been prescribed to you. Take any new Medicines after you have completely understood and accpet all the possible adverse reactions/side effects.   Do not drive when taking Pain medications or sleeping medications (Benzodiazepines)  Do not take more than prescribed Pain, Sleep and Anxiety Medications. It is not advisable to combine anxiety,sleep and pain medications without talking with your primary care practitioner  Special Instructions: If you have smoked or chewed Tobacco  in the last 2 yrs please stop smoking, stop any regular Alcohol  and or any Recreational drug use.  Wear Seat belts while driving.  Do not drive if taking any narcotic, mind altering or controlled substances or recreational drugs or alcohol.

## 2024-01-13 NOTE — Discharge Summary (Signed)
 Physician Discharge Summary  TOSH DANESH UJW:[id number] DOB: 12-25-1945 DOA: 01/09/2024  PCP: Yvonnie Heritage, NP  Admit date: 01/09/2024 Discharge date: 01/13/2024  Admitted From:  Home  Disposition:  Home   Recommendations for Outpatient Follow-up:  Follow up with PCP in 1 weeks Follow up with Dr. Tresia Fruit as scheduled Please check BMP renal function in 1 weeks Please recheck blood pressure in 1 week Soft foods diet recommended Outpatient palliative medicine consultation recommended  Discharge Condition: STABLE   CODE STATUS: DNR DIET: soft foods, advance as tolerated    Brief Hospitalization Summary: Please see all hospital notes, images, labs for full details of the hospitalization. Admission provider HPI:  78 y.o. male with medical history significant of CKD, GERD, HLD, HTN, RA.  Presenting w/ progressive ABD pain. Started nearly 2 wks ago. Seen on 12/31/23 and Dx w/ mild diverticulitis and started on Augmentin . Pain is midline and constant. Associated w/ n/v. No Melena, hematochezia, BRBPR.  He was admitted for IV antibiotics due to failing outpatient management with oral antibiotics.    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    Chronic pain: - continue home tramadol  as needed   GERD: - continue PPI   Depression/Anxiety/memory: at baseline Vascular dementia and question of alzheimer's disease  Continue Aricept , Lexapro  Pt has been followed by neurologist Dr. Tresia Fruit for testing/follow up   Discharge Diagnoses:  Principal Problem:   Diverticulitis Active Problems:   DNR (do not resuscitate)   Failure to thrive in adult   Discharge Instructions:  Allergies as of 01/13/2024   No Known Allergies      Medication List     STOP taking these medications    amoxicillin -clavulanate 875-125 MG tablet Commonly known as: AUGMENTIN    meloxicam 15 MG tablet Commonly known as: MOBIC   tiZANidine  2 MG tablet Commonly known as: ZANAFLEX        TAKE these medications    acetaminophen  500 MG tablet Commonly known as: TYLENOL  Take 1 tablet (500 mg total) by mouth every 6 (six) hours as needed for mild pain or headache.   alendronate 70 MG tablet Commonly known as: FOSAMAX Take 70 mg by mouth once a week.   amLODipine  5 MG tablet Commonly known as: NORVASC  Take 1 tablet (5 mg total) by mouth daily.   aspirin  EC 81 MG tablet Take 81 mg by mouth daily. Swallow whole.  ciprofloxacin  250 MG tablet Commonly known as: CIPRO  Take 1 tablet (250 mg total) by mouth 2 (two) times daily for 7 days.   donepezil  5 MG tablet Commonly known as: ARICEPT  TAKE 1 TABLET BY MOUTH EVERYDAY AT BEDTIME   escitalopram  20 MG tablet Commonly known as: LEXAPRO  Take 20 mg by mouth at bedtime.   levothyroxine  25 MCG tablet Commonly known as: SYNTHROID  Take 12.5 mcg by mouth daily before  breakfast.   losartan  100 MG tablet Commonly known as: COZAAR  Take 100 mg by mouth daily.   Melatonin 10 MG Tabs Take 10 mg by mouth at bedtime.   metroNIDAZOLE  500 MG tablet Commonly known as: Flagyl  Take 1 tablet (500 mg total) by mouth 2 (two) times daily with a meal. DO NOT CONSUME ALCOHOL WHILE TAKING THIS MEDICATION.   omeprazole 40 MG capsule Commonly known as: PRILOSEC Take 40 mg by mouth daily. 30 minutes before a meal   ondansetron  4 MG disintegrating tablet Commonly known as: ZOFRAN -ODT Take 1 tablet (4 mg total) by mouth every 8 (eight) hours as needed for nausea or vomiting.   pravastatin  40 MG tablet Commonly known as: PRAVACHOL  Take 40 mg by mouth daily.   predniSONE  10 MG tablet Commonly known as: DELTASONE  Take 10 mg by mouth daily.   propranolol  ER 60 MG 24 hr capsule Commonly known as: INDERAL  LA TAKE 1 CAPSULE DAILY-CALL & SCHEDULE APPOINTMENT FOR MARCH 2024 OR SOONER IF SYMPTOMS CHANGE   traMADol  50 MG tablet Commonly known as: ULTRAM  Take 50 mg by mouth 3 (three) times daily as needed for moderate pain (pain score 4-6).   traZODone  50 MG tablet Commonly known as: DESYREL  Take 50 mg by mouth at bedtime.        Follow-up Information     Yvonnie Heritage, NP Follow up in 1 week(s).   Why: Hospital Follow Up Contact information: [address] [place] [address] [phone]                No Known Allergies Allergies as of 01/13/2024   No Known Allergies      Medication List     STOP taking these medications    amoxicillin -clavulanate 875-125 MG tablet Commonly known as: AUGMENTIN    meloxicam 15 MG tablet Commonly known as: MOBIC   tiZANidine  2 MG tablet Commonly known as: ZANAFLEX        TAKE these medications    acetaminophen  500 MG tablet Commonly known as: TYLENOL  Take 1 tablet (500 mg total) by mouth every 6 (six) hours as needed for mild pain or headache.   alendronate 70 MG tablet Commonly  known as: FOSAMAX Take 70 mg by mouth once a week.   amLODipine  5 MG tablet Commonly known as: NORVASC  Take 1 tablet (5 mg total) by mouth daily.   aspirin  EC 81 MG tablet Take 81 mg by mouth daily. Swallow whole.   ciprofloxacin  250 MG tablet Commonly known as: CIPRO  Take 1 tablet (250 mg total) by mouth 2 (two) times daily for 7 days.   donepezil  5 MG tablet Commonly known as: ARICEPT  TAKE 1 TABLET BY MOUTH EVERYDAY AT BEDTIME   escitalopram  20 MG tablet Commonly known as: LEXAPRO  Take 20 mg by mouth at bedtime.   levothyroxine  25 MCG tablet Commonly known as: SYNTHROID  Take 12.5 mcg by mouth daily before breakfast.   losartan  100 MG tablet Commonly known as: COZAAR  Take 100 mg by mouth daily.   Melatonin 10 MG Tabs Take 10 mg  by mouth at bedtime.   metroNIDAZOLE  500 MG tablet Commonly known as: Flagyl  Take 1 tablet (500 mg total) by mouth 2 (two) times daily with a meal. DO NOT CONSUME ALCOHOL WHILE TAKING THIS MEDICATION.   omeprazole 40 MG capsule Commonly known as: PRILOSEC Take 40 mg by mouth daily. 30 minutes before a meal   ondansetron  4 MG disintegrating tablet Commonly known as: ZOFRAN -ODT Take 1 tablet (4 mg total) by mouth every 8 (eight) hours as needed for nausea or vomiting.   pravastatin  40 MG tablet Commonly known as: PRAVACHOL  Take 40 mg by mouth daily.   predniSONE  10 MG tablet Commonly known as: DELTASONE  Take 10 mg by mouth daily.   propranolol  ER 60 MG 24 hr capsule Commonly known as: INDERAL  LA TAKE 1 CAPSULE DAILY-CALL & SCHEDULE APPOINTMENT FOR MARCH 2024 OR SOONER IF SYMPTOMS CHANGE   traMADol  50 MG tablet Commonly known as: ULTRAM  Take 50 mg by mouth 3 (three) times daily as needed for moderate pain (pain score 4-6).   traZODone  50 MG tablet Commonly known as: DESYREL  Take 50 mg by mouth at bedtime.        Procedures/Studies: CT ABDOMEN PELVIS WO CONTRAST Result Date: 01/09/2024 CLINICAL DATA:  Acute abdominal pain  EXAM: CT ABDOMEN AND PELVIS WITHOUT CONTRAST TECHNIQUE: Multidetector CT imaging of the abdomen and pelvis was performed following the standard protocol without IV contrast. RADIATION DOSE REDUCTION: This exam was performed according to the departmental dose-optimization program which includes automated exposure control, adjustment of the mA and/or kV according to patient size and/or use of iterative reconstruction technique. COMPARISON:  12/31/2023 FINDINGS: Lower chest: No acute abnormality. Hepatobiliary: No focal liver abnormality is seen. Status post cholecystectomy. No biliary dilatation. Pancreas: Unremarkable. No pancreatic ductal dilatation or surrounding inflammatory changes. Spleen: Normal in size without focal abnormality. Adrenals/Urinary Tract: Adrenal glands are within normal limits. Kidneys demonstrate no renal calculi or obstructive changes bilaterally. Ureters are within normal limits. The bladder is decompressed. Stomach/Bowel: Changes of sigmoid diverticulitis are again seen and stable. No definitive perforation or abscess formation is noted. The more proximal colon appears within normal limits. The appendix is unremarkable. Stomach and small bowel are within normal limits. Vascular/Lymphatic: Atherosclerotic calcifications are noted. Stable ectasia of the infrarenal aorta is noted. No adenopathy is noted. Reproductive: Prostate is unremarkable. Other: No abdominal wall hernia or abnormality. No abdominopelvic ascites. Musculoskeletal: Degenerative change of the lumbar spine with postsurgical change. No acute abnormality is noted. IMPRESSION: Mild sigmoid diverticulitis similar to that seen on the prior exam. No abscess or perforation is noted. Electronically Signed   By: Violeta Grey M.D.   On: 01/09/2024 20:55   DG Abd Acute W/Chest Result Date: 01/09/2024 CLINICAL DATA:  Abdominal pain EXAM: DG ABDOMEN ACUTE WITH 1 VIEW CHEST COMPARISON:  12/31/2023 CT FINDINGS: Cardiac shadow is within  normal limits. Lungs are well aerated bilaterally. Aortic calcifications are seen. No bony abnormality is noted. Scattered large and small bowel gas is noted. No free air is seen. No obstructive changes are noted. Postsurgical change in the lumbar spine is noted. Degenerative changes of lumbar spine are seen as well. IMPRESSION: Nonspecific chest and abdomen.  No acute abnormality noted. Electronically Signed   By: Violeta Grey M.D.   On: 01/09/2024 19:25   CT CHEST ABDOMEN PELVIS W CONTRAST Result Date: 12/31/2023 EXAM: CT CHEST, ABDOMEN AND PELVIS WITH CONTRAST 12/31/2023 09:24:43 PM TECHNIQUE: CT of the chest, abdomen and pelvis was performed with 80 mL of iohexol  (OMNIPAQUE )  300 mg/mL solution. Multiplanar reformatted images are provided for review. Automated exposure control, iterative reconstruction, and/or weight based adjustment of the mA/kV was utilized to reduce the radiation dose to as low as reasonably achievable. COMPARISON: CT abdomen/pelvis dated 12/07/2023 and CTA chest dated 08/29/2022. CLINICAL HISTORY: Sepsis. Patient complains of continued abdominal pain since last visit 4/3. FINDINGS: MEDIASTINUM: Cardiomegaly. Atherosclerotic calcifications of the aortic root/arch. Moderate 3 vessel coronary atherosclerosis. LYMPH NODES: No evidence of mediastinal, hilar or axillary lymphadenopathy. LUNGS AND PLEURA: 5 mm irregular nodule in the anterior left lower lobe (image 70), at the site of prior plate-like opacity, likely post-infectious/inflammatory scarring. No follow up is recommended. No evidence of pleural effusion or pneumothorax. HEPATOBILIARY: Liver is unremarkable. Status post cholecystectomy. SPLEEN: No acute abnormality. PANCREAS: No acute abnormality. ADRENAL GLANDS: No acute abnormality. KIDNEYS, URETERS AND BLADDER: Scattered simple bilateral renal cysts measuring up to 2.2 cm in the right lower kidney (image 67), benign (Bosniak 1). No follow-up is recommended. No stones in the kidneys  or ureters. No evidence of hydronephrosis. No evidence of perinephric or periureteral stranding. GI AND BOWEL: Sigmoid diverticulosis with mild pericolonic stranding (image 98), suggesting mild sigmoid diverticulitis. No drainable fluid collection/abscess. No free air. The appendix is naturally visualized, likely surgically absent. REPRODUCTIVE: Reproductive organs are unremarkable. PERITONEUM AND RETROPERITONEUM: No evidence of ascites or free air. 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 LYMPH NODES: No evidence of lymphadenopathy. BONES AND SOFT TISSUES: No acute osseous abnormality. Postoperative changes related to prior right inguinal hernia repair. IMPRESSION: 1. Mild sigmoid diverticulitis. No drainable fluid collection/abscess. No free air. 2. Additional ancillary findings, as above. Electronically signed by: Zadie Herter MD 12/31/2023 09:40 PM EDT RP Workstation: [id number]   ECHOCARDIOGRAM COMPLETE Result Date: 12/27/2023    ECHOCARDIOGRAM REPORT   Patient Name:   YOGESH SMOKER Date of Exam: 12/27/2023 Medical Rec #:  [id number]       Height:       67.0 in Accession #:    [id number]      Weight:       153.0 lb Date of Birth:  May 20, 1946        BSA:          1.805 m Patient Age:    78 years        BP:           148/90 mmHg Patient Gender: M               HR:           59 bpm. Exam Location:  [facility] Procedure: 2D Echo, Cardiac Doppler and Color Doppler (Both Spectral and Color            Flow Doppler were utilized during procedure). 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 CAD, CKD, Aortic Valve Disease,                 Signs/Symptoms:Chest Pain; Risk Factors:Dyslipidemia and                 Hypertension.  Sonographer:    Reed Canes RCS, RVS Referring Phys: [id number] Joyceann No BRANCH  Sonographer Comments: Global longitudinal strain was  attempted. IMPRESSIONS  1. Left ventricular ejection fraction, by estimation, is 65 to 70%. The left  ventricle has normal function. The left ventricle has no regional wall motion abnormalities. There is moderate asymmetric left ventricular hypertrophy of the basal segment. Left ventricular diastolic parameters are consistent with Grade I diastolic dysfunction (impaired relaxation).  2. Right ventricular systolic function is normal. The right ventricular size is normal. There is normal pulmonary artery systolic pressure. The estimated right ventricular systolic pressure is 19.2 mmHg.  3. The mitral valve is degenerative. Trivial mitral valve regurgitation. The mean mitral valve gradient is 2.0 mmHg.  4. The aortic valve is tricuspid. There is moderate calcification of the aortic valve. Aortic valve regurgitation is trivial. Mild to moderate aortic valve stenosis. Aortic valve area, by VTI measures 1.52 cm. Aortic valve mean gradient measures 15.0 mmHg. Dimentionless index 0.44.  5. Aortic dilatation noted. There is mild dilatation of the aortic root, measuring 41 mm.  6. The inferior vena cava is normal in size with greater than 50% respiratory variability, suggesting right atrial pressure of 3 mmHg. Comparison(s): A prior study was performed on 08/26/2022. MIld to moderate aortic stenosis with mean AV gradient 15 mmHg and DI 0.44. Mildly dilated aortic root at 41 mm. FINDINGS  Left Ventricle: Left ventricular ejection fraction, by estimation, is 65 to 70%. The left ventricle has normal function. The left ventricle has no regional wall motion abnormalities. The left ventricular internal cavity size was normal in size. There is  moderate asymmetric left ventricular hypertrophy of the basal segment. Left ventricular diastolic parameters are consistent with Grade I diastolic dysfunction (impaired relaxation). Right Ventricle: The right ventricular size is normal. No increase in right ventricular wall thickness. Right  ventricular systolic function is normal. There is normal pulmonary artery systolic pressure. The tricuspid regurgitant velocity is 2.01 m/s, and  with an assumed right atrial pressure of 3 mmHg, the estimated right ventricular systolic pressure is 19.2 mmHg. Left Atrium: Left atrial size was normal in size. Right Atrium: Right atrial size was normal in size. Pericardium: There is no evidence of pericardial effusion. Presence of epicardial fat layer. Mitral Valve: The mitral valve is degenerative in appearance. There is mild thickening of the mitral valve leaflet(s). There is mild calcification of the mitral valve leaflet(s). Mild mitral annular calcification. Trivial mitral valve regurgitation. MV peak gradient, 5.3 mmHg. The mean mitral valve gradient is 2.0 mmHg. Tricuspid Valve: The tricuspid valve is grossly normal. Tricuspid valve regurgitation is mild. Aortic Valve: The aortic valve is tricuspid. There is moderate calcification of the aortic valve. There is mild to moderate aortic valve annular calcification. Aortic valve regurgitation is trivial. Mild to moderate aortic stenosis is present. Aortic valve mean gradient measures 15.0 mmHg. Aortic valve peak gradient measures 29.1 mmHg. Aortic valve area, by VTI measures 1.52 cm. Pulmonic Valve: The pulmonic valve was grossly normal. Pulmonic valve regurgitation is trivial. Aorta: Aortic dilatation noted. There is mild dilatation of the aortic root, measuring 41 mm. Venous: The inferior vena cava is normal in size with greater than 50% respiratory variability, suggesting right atrial pressure of 3 mmHg. IAS/Shunts: No atrial level shunt detected by color flow Doppler. Additional Comments: 3D was performed not requiring image post processing on an independent workstation and was indeterminate.  LEFT VENTRICLE PLAX 2D LVIDd:         4.70 cm     Diastology LVIDs:         2.70 cm     LV e' medial:    4.03 cm/s LV PW:         1.30  cm     LV E/e' medial:  25.6 LV IVS:         1.30 cm     LV e' lateral:   4.03 cm/s LVOT diam:     2.10 cm     LV E/e' lateral: 25.6 LV SV:         92 LV SV Index:   51 LVOT Area:     3.46 cm  LV Volumes (MOD) LV vol d, MOD A2C: 56.1 ml LV vol d, MOD A4C: 72.8 ml LV vol s, MOD A2C: 21.8 ml LV vol s, MOD A4C: 32.2 ml LV SV MOD A2C:     34.3 ml LV SV MOD A4C:     72.8 ml LV SV MOD BP:      37.6 ml RIGHT VENTRICLE             IVC RV Basal diam:  2.90 cm     IVC diam: 1.20 cm RV Mid diam:    3.70 cm RV S prime:     10.70 cm/s TAPSE (M-mode): 2.6 cm LEFT ATRIUM             Index        RIGHT ATRIUM           Index LA diam:        3.30 cm 1.83 cm/m   RA Area:     13.60 cm LA Vol (A2C):   26.6 ml 14.74 ml/m  RA Volume:   28.20 ml  15.63 ml/m LA Vol (A4C):   23.6 ml 13.08 ml/m LA Biplane Vol: 25.0 ml 13.85 ml/m  AORTIC VALVE                     PULMONIC VALVE AV Area (Vmax):    1.62 cm      PV Vmax:       0.90 m/s AV Area (Vmean):   1.55 cm      PV Peak grad:  3.3 mmHg AV Area (VTI):     1.52 cm AV Vmax:           269.50 cm/s AV Vmean:          181.000 cm/s AV VTI:            0.610 m AV Peak Grad:      29.1 mmHg AV Mean Grad:      15.0 mmHg LVOT Vmax:         126.00 cm/s LVOT Vmean:        81.100 cm/s LVOT VTI:          0.267 m LVOT/AV VTI ratio: 0.44  AORTA Ao Root diam: 4.10 cm Ao Asc diam:  3.70 cm MITRAL VALVE                TRICUSPID VALVE MV Area (PHT): 3.66 cm     TR Peak grad:   16.2 mmHg MV Area VTI:   2.49 cm     TR Vmax:        201.00 cm/s MV Peak grad:  5.3 mmHg MV Mean grad:  2.0 mmHg     SHUNTS MV Vmax:       1.15 m/s     Systemic VTI:  0.27 m MV Vmean:      63.9 cm/s    Systemic Diam: 2.10 cm MV Decel Time: 207 msec MV E velocity: 103.00 cm/s MV A velocity: 122.00 cm/s MV E/A ratio:  0.84 Hilario Lover  Mcdowell MD Electronically signed by Teddie Favre MD Signature Date/Time: 12/27/2023/10:17:38 AM    Final      Subjective: Pt tolerating diet and reports feeling well and very eager and insistent to discharge home  Discharge  Exam: Vitals:   01/12/24 2027 01/13/24 0500  BP: (!) 146/73 (!) 158/80  Pulse: 68 62  Resp: 18 18  Temp: 98.4 F (36.9 C) 98.5 F (36.9 C)  SpO2: 96% 95%   Vitals:   01/12/24 0553 01/12/24 1440 01/12/24 2027 01/13/24 0500  BP: (!) 149/69 138/75 (!) 146/73 (!) 158/80  Pulse: (!) 55 62 68 62  Resp: 17 18 18 18   Temp: 97.8 F (36.6 C) 97.7 F (36.5 C) 98.4 F (36.9 C) 98.5 F (36.9 C)  TempSrc:  Oral Oral Oral  SpO2: 97% 95% 96% 95%  Weight:      Height:       General: Pt is alert, awake, not in acute distress Cardiovascular: normal S1/S2 +, no rubs, no gallops Respiratory: CTA bilaterally, no wheezing, no rhonchi Abdominal: Soft, NT, ND, bowel sounds + Extremities: no edema, no cyanosis   The results of significant diagnostics from this hospitalization (including imaging, microbiology, ancillary and laboratory) are listed below for reference.     Microbiology: No results found for this or any previous visit (from the past 240 hours).   Labs: BNP (last 3 results) Recent Labs    12/07/23 1610  BNP 48.0   Basic Metabolic Panel: Recent Labs  Lab 01/09/24 1904 01/10/24 0422 01/11/24 0407 01/12/24 0336 01/13/24 0321  NA 135 137 135 137 141  K 5.2* 4.3 3.6 3.8 3.7  CL 103 107 105 106 108  CO2 23 21* 24 25 24   GLUCOSE 145* 92 80 135* 73  BUN 29* 29* 24* 19 20  CREATININE 1.78* 1.60* 1.97* 1.83* 1.59*  CALCIUM 8.7* 7.8* 7.8* 8.0* 8.3*  MG 2.1  --   --   --  1.8   Liver Function Tests: Recent Labs  Lab 01/09/24 1904 01/10/24 0422  AST 26 14*  ALT 15 12  ALKPHOS 82 59  BILITOT 0.6 0.5  PROT 6.8 5.1*  ALBUMIN  3.2* 2.5*   Recent Labs  Lab 01/09/24 1904  LIPASE 80*   No results for input(s): "AMMONIA" in the last 168 hours. CBC: Recent Labs  Lab 01/09/24 1904 01/10/24 0619 01/11/24 0407 01/13/24 0321  WBC 12.8* 10.9* 8.9 8.8  NEUTROABS 10.7*  --  5.0  --   HGB 11.6* 9.4* 8.7* 9.3*  HCT 36.5* 30.1* 28.5* 29.7*  MCV 83.7 85.5 84.1 83.9  PLT  394 272 259 370   Cardiac Enzymes: No results for input(s): "CKTOTAL", "CKMB", "CKMBINDEX", "TROPONINI" in the last 168 hours. BNP: Invalid input(s): "POCBNP" CBG: No results for input(s): "GLUCAP" in the last 168 hours. D-Dimer No results for input(s): "DDIMER" in the last 72 hours. Hgb A1c No results for input(s): "HGBA1C" in the last 72 hours. Lipid Profile No results for input(s): "CHOL", "HDL", "LDLCALC", "TRIG", "CHOLHDL", "LDLDIRECT" in the last 72 hours. Thyroid  function studies No results for input(s): "TSH", "T4TOTAL", "T3FREE", "THYROIDAB" in the last 72 hours.  Invalid input(s): "FREET3" Anemia work up No results for input(s): "VITAMINB12", "FOLATE", "FERRITIN", "TIBC", "IRON", "RETICCTPCT" in the last 72 hours. Urinalysis    Component Value Date/Time   COLORURINE YELLOW 01/09/2024 1927   APPEARANCEUR CLEAR 01/09/2024 1927   LABSPEC 1.014 01/09/2024 1927   PHURINE 8.0 01/09/2024 1927   GLUCOSEU NEGATIVE 01/09/2024 1927   HGBUR  NEGATIVE 01/09/2024 1927   BILIRUBINUR NEGATIVE 01/09/2024 1927   KETONESUR NEGATIVE 01/09/2024 1927   PROTEINUR 100 (A) 01/09/2024 1927   NITRITE NEGATIVE 01/09/2024 1927   LEUKOCYTESUR NEGATIVE 01/09/2024 1927   Sepsis Labs Recent Labs  Lab 01/09/24 1904 01/10/24 0619 01/11/24 0407 01/13/24 0321  WBC 12.8* 10.9* 8.9 8.8   Microbiology No results found for this or any previous visit (from the past 240 hours).  Time coordinating discharge:  38 mins   SIGNED:  Faustino Hook, MD  [facility] 01/13/2024, 11:37 AM How to contact the [facility] Attending or Consulting provider 7A - 7P or covering provider during after hours 7P -7A, for this patient?  Check the care team in [facility] and look for a) attending/consulting [facility] provider listed and b) the [facility] team listed Log into [email or web address] and use [facility]'s universal password to access. If you do not have the password, please contact the hospital operator. Locate the [facility] provider  you are looking for under [facility] and page to a number that you can be directly reached. If you still have difficulty reaching the provider, please page the [facility] (Director on Call) for the Hospitalists listed on amion for assistance.

## 2024-01-15 NOTE — Transitions of Care (Post Inpatient/ED Visit) (Signed)
 01/15/2024  Patient ID: [name], male   DOB: 1945/12/15, 78 y.o.   MRN: [id number]  Medication Review for transitions of care.  See [employer] for documentation.  Merridith Dershem J. December Hedtke RN, MSN [facility], [facility] RN Care Manager Direct Dial: [phone]  Fax: [phone] Website: [email or web address]

## 2024-01-23 ENCOUNTER — Encounter: Payer: Self-pay | Admitting: Medical

## 2024-01-23 ENCOUNTER — Ambulatory Visit: Attending: Medical | Admitting: Medical

## 2024-01-23 DIAGNOSIS — I700 Atherosclerosis of aorta: Secondary | ICD-10-CM | POA: Diagnosis not present

## 2024-01-23 DIAGNOSIS — R7303 Prediabetes: Secondary | ICD-10-CM | POA: Diagnosis not present

## 2024-01-23 DIAGNOSIS — E038 Other specified hypothyroidism: Secondary | ICD-10-CM | POA: Diagnosis not present

## 2024-01-23 DIAGNOSIS — M0589 Other rheumatoid arthritis with rheumatoid factor of multiple sites: Secondary | ICD-10-CM | POA: Diagnosis not present

## 2024-01-23 DIAGNOSIS — K5792 Diverticulitis of intestine, part unspecified, without perforation or abscess without bleeding: Secondary | ICD-10-CM | POA: Diagnosis not present

## 2024-01-23 DIAGNOSIS — R600 Localized edema: Secondary | ICD-10-CM | POA: Diagnosis not present

## 2024-01-23 DIAGNOSIS — M1A0790 Idiopathic chronic gout, unspecified ankle and foot, without tophus (tophi): Secondary | ICD-10-CM | POA: Diagnosis not present

## 2024-01-23 DIAGNOSIS — E7800 Pure hypercholesterolemia, unspecified: Secondary | ICD-10-CM | POA: Diagnosis not present

## 2024-01-23 DIAGNOSIS — R931 Abnormal findings on diagnostic imaging of heart and coronary circulation: Secondary | ICD-10-CM | POA: Diagnosis not present

## 2024-01-23 DIAGNOSIS — R0602 Shortness of breath: Secondary | ICD-10-CM | POA: Diagnosis not present

## 2024-01-23 DIAGNOSIS — Z09 Encounter for follow-up examination after completed treatment for conditions other than malignant neoplasm: Secondary | ICD-10-CM | POA: Diagnosis not present

## 2024-01-23 DIAGNOSIS — I10 Essential (primary) hypertension: Secondary | ICD-10-CM | POA: Diagnosis not present

## 2024-01-23 NOTE — Progress Notes (Deleted)
  Cardiology Office Note:  .   Date:  01/23/2024  ID:  [name], DOB 10-19-1945, MRN [id number] PCP: Yvonnie Heritage, NP  [facility] Providers Cardiologist:  Armida Lander, MD { Click to update primary MD,subspecialty MD or APP then REFRESH:1}   History of Present Illness: .   [name] is a 78 y.o. male with a h/o coronary atherosclerosis, aortic stenosis, HLD, HTN who presents for 3 months follow-up.   Echo in 08/2022 showed LVEF 60-65%, no WMA, AS with mean grad 14 AVA 1.57, DI 0.45. Coronary calcium score 10/2023 showed score of 1960, 84th percentile.   He was seen 11/2023 reporting DOE and echo was ordered. Echo showed LVEF 65-70%, no WMA, mod LVH, G1DD, trivial MR, mild to mod AS, mild aortic dilation measuring 41mm.   Nuclear stress test after echo was the plan  ROS: ***  Studies Reviewed: .        *** Risk Assessment/Calculations:   {Does this patient have ATRIAL FIBRILLATION?:[id number]} No BP recorded.  {Refresh Note OR Click here to enter BP  :1}***       Physical Exam:   VS:  There were no vitals taken for this visit.   Wt Readings from Last 3 Encounters:  01/09/24 143 lb 15.4 oz (65.3 kg)  12/31/23 154 lb 5.2 oz (70 kg)  12/07/23 153 lb (69.4 kg)    GEN: Well nourished, well developed in no acute distress NECK: No JVD; No carotid bruits CARDIAC: ***RRR, no murmurs, rubs, gallops RESPIRATORY:  Clear to auscultation without rales, wheezing or rhonchi  ABDOMEN: Soft, non-tender, non-distended EXTREMITIES:  No edema; No deformity   ASSESSMENT AND PLAN: .   ***    {Are you ordering a CV Procedure (e.g. stress test, cath, DCCV, TEE, etc)?   Press F2        :[id number]}  Dispo: ***  Signed, Bralin Garry Rebekah Canada, PA-C

## 2024-01-25 ENCOUNTER — Inpatient Hospital Stay: Admit: 2024-01-25

## 2024-01-30 DIAGNOSIS — H35363 Drusen (degenerative) of macula, bilateral: Secondary | ICD-10-CM | POA: Diagnosis not present

## 2024-01-30 DIAGNOSIS — H348112 Central retinal vein occlusion, right eye, stable: Secondary | ICD-10-CM | POA: Diagnosis not present

## 2024-01-30 DIAGNOSIS — Z961 Presence of intraocular lens: Secondary | ICD-10-CM | POA: Diagnosis not present

## 2024-02-01 DIAGNOSIS — Z961 Presence of intraocular lens: Secondary | ICD-10-CM | POA: Diagnosis not present

## 2024-02-01 DIAGNOSIS — H353133 Nonexudative age-related macular degeneration, bilateral, advanced atrophic without subfoveal involvement: Secondary | ICD-10-CM | POA: Diagnosis not present

## 2024-02-01 DIAGNOSIS — H34811 Central retinal vein occlusion, right eye, with macular edema: Secondary | ICD-10-CM | POA: Diagnosis not present

## 2024-02-01 DIAGNOSIS — H35033 Hypertensive retinopathy, bilateral: Secondary | ICD-10-CM | POA: Diagnosis not present

## 2024-02-02 ENCOUNTER — Other Ambulatory Visit: Payer: Self-pay | Admitting: Neurology

## 2024-02-02 DIAGNOSIS — R4189 Other symptoms and signs involving cognitive functions and awareness: Secondary | ICD-10-CM

## 2024-02-05 ENCOUNTER — Other Ambulatory Visit: Payer: Self-pay | Admitting: Adult Health

## 2024-02-05 DIAGNOSIS — M47816 Spondylosis without myelopathy or radiculopathy, lumbar region: Secondary | ICD-10-CM | POA: Diagnosis not present

## 2024-02-07 ENCOUNTER — Ambulatory Visit: Admitting: Cardiology

## 2024-02-09 ENCOUNTER — Emergency Department (HOSPITAL_COMMUNITY)

## 2024-02-09 ENCOUNTER — Other Ambulatory Visit: Payer: Self-pay

## 2024-02-09 ENCOUNTER — Observation Stay (HOSPITAL_COMMUNITY)
Admission: EM | Admit: 2024-02-09 | Discharge: 2024-02-11 | Disposition: A | Discharge status: Home / Self Care | Attending: Family Medicine | Admitting: Family Medicine

## 2024-02-09 DIAGNOSIS — Z79899 Other long term (current) drug therapy: Secondary | ICD-10-CM | POA: Diagnosis not present

## 2024-02-09 DIAGNOSIS — R109 Unspecified abdominal pain: Secondary | ICD-10-CM | POA: Diagnosis not present

## 2024-02-09 DIAGNOSIS — R079 Chest pain, unspecified: Secondary | ICD-10-CM | POA: Diagnosis not present

## 2024-02-09 DIAGNOSIS — G3189 Other specified degenerative diseases of nervous system: Secondary | ICD-10-CM | POA: Diagnosis not present

## 2024-02-09 DIAGNOSIS — D649 Anemia, unspecified: Secondary | ICD-10-CM

## 2024-02-09 DIAGNOSIS — K648 Other hemorrhoids: Secondary | ICD-10-CM | POA: Diagnosis not present

## 2024-02-09 DIAGNOSIS — Z7982 Long term (current) use of aspirin: Secondary | ICD-10-CM | POA: Diagnosis not present

## 2024-02-09 DIAGNOSIS — R1314 Dysphagia, pharyngoesophageal phase: Secondary | ICD-10-CM | POA: Insufficient documentation

## 2024-02-09 DIAGNOSIS — R1084 Generalized abdominal pain: Secondary | ICD-10-CM | POA: Diagnosis present

## 2024-02-09 DIAGNOSIS — K259 Gastric ulcer, unspecified as acute or chronic, without hemorrhage or perforation: Secondary | ICD-10-CM | POA: Diagnosis present

## 2024-02-09 DIAGNOSIS — F32A Depression, unspecified: Secondary | ICD-10-CM | POA: Diagnosis not present

## 2024-02-09 DIAGNOSIS — R6889 Other general symptoms and signs: Secondary | ICD-10-CM | POA: Diagnosis not present

## 2024-02-09 DIAGNOSIS — I129 Hypertensive chronic kidney disease with stage 1 through stage 4 chronic kidney disease, or unspecified chronic kidney disease: Secondary | ICD-10-CM | POA: Diagnosis not present

## 2024-02-09 DIAGNOSIS — K5792 Diverticulitis of intestine, part unspecified, without perforation or abscess without bleeding: Secondary | ICD-10-CM | POA: Diagnosis not present

## 2024-02-09 DIAGNOSIS — K219 Gastro-esophageal reflux disease without esophagitis: Secondary | ICD-10-CM | POA: Diagnosis present

## 2024-02-09 DIAGNOSIS — R1310 Dysphagia, unspecified: Secondary | ICD-10-CM | POA: Diagnosis not present

## 2024-02-09 DIAGNOSIS — K2970 Gastritis, unspecified, without bleeding: Secondary | ICD-10-CM | POA: Insufficient documentation

## 2024-02-09 DIAGNOSIS — R069 Unspecified abnormalities of breathing: Secondary | ICD-10-CM | POA: Diagnosis not present

## 2024-02-09 DIAGNOSIS — R627 Adult failure to thrive: Secondary | ICD-10-CM | POA: Diagnosis present

## 2024-02-09 DIAGNOSIS — R1013 Epigastric pain: Secondary | ICD-10-CM

## 2024-02-09 DIAGNOSIS — R1319 Other dysphagia: Secondary | ICD-10-CM

## 2024-02-09 DIAGNOSIS — N179 Acute kidney failure, unspecified: Principal | ICD-10-CM

## 2024-02-09 DIAGNOSIS — Z9049 Acquired absence of other specified parts of digestive tract: Secondary | ICD-10-CM | POA: Diagnosis not present

## 2024-02-09 DIAGNOSIS — I10 Essential (primary) hypertension: Secondary | ICD-10-CM | POA: Diagnosis present

## 2024-02-09 DIAGNOSIS — N1832 Chronic kidney disease, stage 3b: Secondary | ICD-10-CM | POA: Diagnosis not present

## 2024-02-09 DIAGNOSIS — R0789 Other chest pain: Secondary | ICD-10-CM | POA: Diagnosis not present

## 2024-02-09 DIAGNOSIS — F419 Anxiety disorder, unspecified: Secondary | ICD-10-CM | POA: Insufficient documentation

## 2024-02-09 DIAGNOSIS — R112 Nausea with vomiting, unspecified: Secondary | ICD-10-CM

## 2024-02-09 DIAGNOSIS — N189 Chronic kidney disease, unspecified: Secondary | ICD-10-CM | POA: Diagnosis not present

## 2024-02-09 DIAGNOSIS — E860 Dehydration: Secondary | ICD-10-CM | POA: Diagnosis not present

## 2024-02-09 DIAGNOSIS — G3184 Mild cognitive impairment, so stated: Secondary | ICD-10-CM | POA: Diagnosis present

## 2024-02-09 DIAGNOSIS — K5730 Diverticulosis of large intestine without perforation or abscess without bleeding: Secondary | ICD-10-CM | POA: Diagnosis present

## 2024-02-09 DIAGNOSIS — E785 Hyperlipidemia, unspecified: Secondary | ICD-10-CM | POA: Diagnosis not present

## 2024-02-09 DIAGNOSIS — M069 Rheumatoid arthritis, unspecified: Secondary | ICD-10-CM | POA: Diagnosis present

## 2024-02-09 LAB — URINALYSIS, ROUTINE W REFLEX MICROSCOPIC
Bacteria, UA: NONE SEEN
Bilirubin Urine: NEGATIVE
Glucose, UA: NEGATIVE mg/dL
Hgb urine dipstick: NEGATIVE
Ketones, ur: NEGATIVE mg/dL
Leukocytes,Ua: NEGATIVE
Nitrite: NEGATIVE
Protein, ur: 30 mg/dL — AB
Specific Gravity, Urine: 1.016 (ref 1.005–1.030)
pH: 5 (ref 5.0–8.0)

## 2024-02-09 LAB — COMPREHENSIVE METABOLIC PANEL WITH GFR
ALT: 18 U/L (ref 0–44)
AST: 21 U/L (ref 15–41)
Albumin: 2.8 g/dL — ABNORMAL LOW (ref 3.5–5.0)
Alkaline Phosphatase: 61 U/L (ref 38–126)
Anion gap: 10 (ref 5–15)
BUN: 47 mg/dL — ABNORMAL HIGH (ref 8–23)
CO2: 21 mmol/L — ABNORMAL LOW (ref 22–32)
Calcium: 7.9 mg/dL — ABNORMAL LOW (ref 8.9–10.3)
Chloride: 106 mmol/L (ref 98–111)
Creatinine, Ser: 3.08 mg/dL — ABNORMAL HIGH (ref 0.61–1.24)
GFR, Estimated: 20 mL/min — ABNORMAL LOW (ref >60)
Glucose, Bld: 115 mg/dL — ABNORMAL HIGH (ref 70–99)
Potassium: 4 mmol/L (ref 3.5–5.1)
Sodium: 137 mmol/L (ref 135–145)
Total Bilirubin: 0.5 mg/dL (ref 0.0–1.2)
Total Protein: 6.2 g/dL — ABNORMAL LOW (ref 6.5–8.1)

## 2024-02-09 LAB — MAGNESIUM: Magnesium: 2.3 mg/dL (ref 1.7–2.4)

## 2024-02-09 LAB — CBC
HCT: 31 % — ABNORMAL LOW (ref 39.0–52.0)
Hemoglobin: 9.8 g/dL — ABNORMAL LOW (ref 13.0–17.0)
MCH: 25.5 pg — ABNORMAL LOW (ref 26.0–34.0)
MCHC: 31.6 g/dL (ref 30.0–36.0)
MCV: 80.7 fL (ref 80.0–100.0)
Platelets: 361 10*3/uL (ref 150–400)
RBC: 3.84 MIL/uL — ABNORMAL LOW (ref 4.22–5.81)
RDW: 15.9 % — ABNORMAL HIGH (ref 11.5–15.5)
WBC: 11.7 10*3/uL — ABNORMAL HIGH (ref 4.0–10.5)
nRBC: 0 % (ref 0.0–0.2)

## 2024-02-09 LAB — TROPONIN I (HIGH SENSITIVITY)
Troponin I (High Sensitivity): 10 ng/L (ref <18)
Troponin I (High Sensitivity): 8 ng/L (ref <18)

## 2024-02-09 LAB — IRON AND TIBC
Iron: 20 ug/dL — ABNORMAL LOW (ref 45–182)
Saturation Ratios: 7 % — ABNORMAL LOW (ref 17.9–39.5)
TIBC: 309 ug/dL (ref 250–450)
UIBC: 289 ug/dL

## 2024-02-09 LAB — LIPASE, BLOOD: Lipase: 61 U/L — ABNORMAL HIGH (ref 11–51)

## 2024-02-09 LAB — PHOSPHORUS: Phosphorus: 3.2 mg/dL (ref 2.5–4.6)

## 2024-02-09 LAB — GLUCOSE, CAPILLARY: Glucose-Capillary: 134 mg/dL — ABNORMAL HIGH (ref 70–99)

## 2024-02-09 MED ORDER — ONDANSETRON HCL 4 MG/2ML IJ SOLN
4.0000 mg | Freq: Four times a day (QID) | INTRAMUSCULAR | Status: DC | PRN
  Administered 2024-02-09: 4 mg via INTRAVENOUS
  Filled 2024-02-09: qty 2

## 2024-02-09 MED ORDER — TRAZODONE HCL 50 MG PO TABS
25.0000 mg | ORAL_TABLET | Freq: Every evening | ORAL | Status: DC | PRN
  Administered 2024-02-09: 25 mg via ORAL
  Filled 2024-02-09: qty 1

## 2024-02-09 MED ORDER — ONDANSETRON HCL 4 MG/2ML IJ SOLN
4.0000 mg | Freq: Once | INTRAMUSCULAR | Status: AC
  Administered 2024-02-09: 4 mg via INTRAVENOUS
  Filled 2024-02-09: qty 2

## 2024-02-09 MED ORDER — LACTATED RINGERS IV BOLUS
1000.0000 mL | Freq: Once | INTRAVENOUS | Status: AC
  Administered 2024-02-09: 1000 mL via INTRAVENOUS

## 2024-02-09 MED ORDER — LEVOTHYROXINE SODIUM 25 MCG PO TABS
12.5000 ug | ORAL_TABLET | Freq: Every day | ORAL | Status: DC
  Administered 2024-02-11: 12.5 ug via ORAL
  Filled 2024-02-09: qty 0.5
  Filled 2024-02-09: qty 1

## 2024-02-09 MED ORDER — HEPARIN SODIUM (PORCINE) 5000 UNIT/ML IJ SOLN
5000.0000 [IU] | Freq: Three times a day (TID) | INTRAMUSCULAR | Status: AC
Start: 2024-02-09 — End: 2024-02-09
  Administered 2024-02-09: 5000 [IU] via SUBCUTANEOUS
  Filled 2024-02-09: qty 1

## 2024-02-09 MED ORDER — HYDROMORPHONE HCL 1 MG/ML IJ SOLN
1.0000 mg | INTRAMUSCULAR | Status: DC | PRN
  Administered 2024-02-09 – 2024-02-11 (×5): 1 mg via INTRAVENOUS
  Filled 2024-02-09 (×5): qty 1

## 2024-02-09 MED ORDER — ACETAMINOPHEN 650 MG RE SUPP: 650.0000 mg | Freq: Four times a day (QID) | RECTAL | Status: DC | PRN

## 2024-02-09 MED ORDER — FLEET ENEMA RE ENEM
1.0000 | ENEMA | Freq: Once | RECTAL | Status: DC | PRN
Start: 2024-02-09 — End: 2024-02-10

## 2024-02-09 MED ORDER — SODIUM CHLORIDE 0.9 % IV SOLN: INTRAVENOUS | Status: DC

## 2024-02-09 MED ORDER — PANTOPRAZOLE SODIUM 40 MG IV SOLR
40.0000 mg | Freq: Two times a day (BID) | INTRAVENOUS | Status: DC
  Administered 2024-02-09 – 2024-02-11 (×4): 40 mg via INTRAVENOUS
  Filled 2024-02-09 (×5): qty 10

## 2024-02-09 MED ORDER — IPRATROPIUM BROMIDE 0.02 % IN SOLN: 0.5000 mg | Freq: Four times a day (QID) | RESPIRATORY_TRACT | Status: DC | PRN

## 2024-02-09 MED ORDER — SODIUM CHLORIDE 0.9% FLUSH
3.0000 mL | Freq: Two times a day (BID) | INTRAVENOUS | Status: DC
  Administered 2024-02-10 – 2024-02-11 (×2): 3 mL via INTRAVENOUS

## 2024-02-09 MED ORDER — BISACODYL 5 MG PO TBEC: 5.0000 mg | DELAYED_RELEASE_TABLET | Freq: Every day | ORAL | Status: DC | PRN

## 2024-02-09 MED ORDER — PROPRANOLOL HCL ER 60 MG PO CP24
60.0000 mg | ORAL_CAPSULE | Freq: Every day | ORAL | Status: DC
  Administered 2024-02-10 – 2024-02-11 (×2): 60 mg via ORAL
  Filled 2024-02-09 (×3): qty 1

## 2024-02-09 MED ORDER — SENNOSIDES-DOCUSATE SODIUM 8.6-50 MG PO TABS: 1.0000 | ORAL_TABLET | Freq: Every evening | ORAL | Status: DC | PRN

## 2024-02-09 MED ORDER — PANTOPRAZOLE SODIUM 40 MG PO TBEC: 40.0000 mg | DELAYED_RELEASE_TABLET | Freq: Every day | ORAL | Status: DC

## 2024-02-09 MED ORDER — OXYCODONE HCL 5 MG PO TABS
5.0000 mg | ORAL_TABLET | ORAL | Status: DC | PRN
  Administered 2024-02-09: 5 mg via ORAL
  Filled 2024-02-09: qty 1

## 2024-02-09 MED ORDER — ACETAMINOPHEN 325 MG PO TABS
650.0000 mg | ORAL_TABLET | Freq: Four times a day (QID) | ORAL | Status: DC | PRN
  Administered 2024-02-10: 650 mg via ORAL
  Filled 2024-02-09 (×2): qty 2

## 2024-02-09 MED ORDER — ESCITALOPRAM OXALATE 10 MG PO TABS
20.0000 mg | ORAL_TABLET | Freq: Every day | ORAL | Status: DC
  Administered 2024-02-09: 20 mg via ORAL
  Filled 2024-02-09: qty 2

## 2024-02-09 MED ORDER — ALUM & MAG HYDROXIDE-SIMETH 200-200-20 MG/5ML PO SUSP
15.0000 mL | Freq: Three times a day (TID) | ORAL | Status: DC
  Administered 2024-02-09 – 2024-02-11 (×5): 15 mL via ORAL
  Filled 2024-02-09 (×5): qty 30

## 2024-02-09 MED ORDER — SODIUM CHLORIDE 0.9% FLUSH
3.0000 mL | Freq: Two times a day (BID) | INTRAVENOUS | Status: DC
  Administered 2024-02-09 – 2024-02-11 (×4): 3 mL via INTRAVENOUS

## 2024-02-09 MED ORDER — PREDNISONE 10 MG PO TABS
10.0000 mg | ORAL_TABLET | Freq: Every day | ORAL | Status: DC
  Administered 2024-02-11: 10 mg via ORAL
  Filled 2024-02-09: qty 1

## 2024-02-09 MED ORDER — METOCLOPRAMIDE HCL 5 MG/ML IJ SOLN
10.0000 mg | Freq: Two times a day (BID) | INTRAMUSCULAR | Status: DC
  Administered 2024-02-09 – 2024-02-11 (×4): 10 mg via INTRAVENOUS
  Filled 2024-02-09 (×4): qty 2

## 2024-02-09 MED ORDER — ONDANSETRON HCL 4 MG PO TABS: 4.0000 mg | ORAL_TABLET | Freq: Four times a day (QID) | ORAL | Status: DC | PRN

## 2024-02-09 MED ORDER — LEVALBUTEROL HCL 0.63 MG/3ML IN NEBU: 0.6300 mg | INHALATION_SOLUTION | Freq: Four times a day (QID) | RESPIRATORY_TRACT | Status: DC | PRN

## 2024-02-09 MED ORDER — HYDRALAZINE HCL 20 MG/ML IJ SOLN
10.0000 mg | INTRAMUSCULAR | Status: AC | PRN
Start: 2024-02-09 — End: ?

## 2024-02-09 MED ORDER — AMLODIPINE BESYLATE 5 MG PO TABS
5.0000 mg | ORAL_TABLET | Freq: Every day | ORAL | Status: DC
  Administered 2024-02-10 – 2024-02-11 (×2): 5 mg via ORAL
  Filled 2024-02-09 (×2): qty 1

## 2024-02-09 MED ORDER — HYDROMORPHONE HCL 1 MG/ML IJ SOLN: 0.5000 mg | INTRAMUSCULAR | Status: DC | PRN

## 2024-02-09 MED ORDER — SENNOSIDES 8.8 MG/5ML PO SYRP
10.0000 mL | ORAL_SOLUTION | Freq: Two times a day (BID) | ORAL | Status: DC
  Administered 2024-02-10: 10 mL via ORAL
  Filled 2024-02-09 (×6): qty 10

## 2024-02-09 MED ORDER — BOOST / RESOURCE BREEZE PO LIQD CUSTOM
1.0000 | Freq: Three times a day (TID) | ORAL | Status: DC
  Administered 2024-02-09 – 2024-02-10 (×2): 1 via ORAL

## 2024-02-09 MED ORDER — MELATONIN 3 MG PO TABS
9.0000 mg | ORAL_TABLET | Freq: Every day | ORAL | Status: DC
  Administered 2024-02-09 – 2024-02-10 (×2): 9 mg via ORAL
  Filled 2024-02-09 (×2): qty 3

## 2024-02-09 NOTE — Assessment & Plan Note (Signed)
-   BP closely, resuming home medications of Norvasc , propranolol  Will hold losartan , -Utilize as needed hydralazine

## 2024-02-09 NOTE — Assessment & Plan Note (Signed)
-   Home medications reviewed will resume Lexapro , trazodone ,

## 2024-02-09 NOTE — H&P (Signed)
 History and Physical   Patient: [name]                            PCP: Yvonnie Heritage, NP                    DOB: [birth year]            DOA: 02/09/2024 WUJ:[id number]             DOS: 02/09/2024, 4:18 PM  Yvonnie Heritage, NP  Patient coming from:   HOME  I have personally reviewed patient's medical records, in electronic medical records, including:  [place] link, and care everywhere.    Chief Complaint:   Chief Complaint  Patient presents with   Abdominal Pain   Chest Pain    History of present illness:    [name] is a 78 year old male with extensive history of mild/early dementia, anxiety, depression, CKD 3B, GERD, gout, hyperlipidemia, hypertension, RA, degenerative disc disease, PUD,  chronic pain-especially abdominal ...  Presented to ED with intractable nausea vomiting and abdominal pain.  Reporting 2 weeks of lower chest, abdominal pain, epigastric pain.  Associated with mild shortness of breath, and progressive nausea vomiting.  He reports his overall constant pain with certain position gets worse.  Pain is mainly periumbilical/upper abdominal area.  With no radiation.  Shortness of breath is constant limited to ability mobility and exertion.  Denies of having fever, chills, cough, congestion, denies having lower extremity edema, denies of having any diarrhea.  Complains of nausea but no extensive vomiting.   ED evaluation: Blood pressure 135/75, pulse 62, temperature 97.6 F (36.4 C), temperature source Oral, resp. rate 18, height 5\' 9"  (1.753 m), weight 65.3 kg, SpO2 100%.  LABs: CBC WBC 11.7, hemoglobin 9.8, CO2 21 BUN 47 creatinine 3.08, GFR 20 calcium 7.9, glucose, troponin 10, 8.  Lactic acid 1.1,  UA pending:  CT abdomen pelvis:  *No acute findings in the abdomen or pelvis. *Diffuse diverticulosis of the sigmoid colon without definitive evidence of diverticulitis. *Moderate amount of residual fecal material throughout the colon without obstruction or  constipation. *Status post cholecystectomy. *Aortic atherosclerosis.   Requested the patient to be admitted for abdominal pain, nausea likely vomiting, dehydration, and worsening kidney function.  Patient Denies having: Fever, Chills, Cough, headache, dizziness, lightheadedness,  Dysuria, Joint pain, rash, open wounds     Review of Systems: As per HPI, otherwise 10 point review of systems were negative.   ----------------------------------------------------------------------------------------------------------------------  No Known Allergies  Home MEDs:  Prior to Admission medications   Medication Sig Start Date End Date Taking? Authorizing Provider  acetaminophen  (TYLENOL ) 500 MG tablet Take 1 tablet (500 mg total) by mouth every 6 (six) hours as needed for mild pain or headache. 12/28/21  Yes Sheikh, Omair Latif, DO  alendronate (FOSAMAX) 70 MG tablet Take 70 mg by mouth once a week. 12/21/23  Yes [provider]  amLODipine  (NORVASC ) 5 MG tablet Take 1 tablet (5 mg total) by mouth daily. 11/21/23 11/15/24 Yes BranchJoyceann No, MD  aspirin  EC 81 MG tablet Take 81 mg by mouth daily. Swallow whole.   Yes [provider]  donepezil  (ARICEPT ) 5 MG tablet TAKE 1 TABLET BY MOUTH EVERYDAY AT BEDTIME 02/05/24  Yes Glory Larsen, MD  escitalopram  (LEXAPRO ) 20 MG tablet Take 20 mg by mouth at bedtime.   Yes [provider]  HYDROcodone -acetaminophen  (NORCO/VICODIN) 5-325 MG tablet Take  1 tablet by mouth 2 (two) times daily as needed. 02/07/24  Yes [provider]  levothyroxine  (SYNTHROID ) 25 MCG tablet Take 12.5 mcg by mouth daily before breakfast.   Yes [provider]  losartan  (COZAAR ) 100 MG tablet Take 100 mg by mouth daily.   Yes [provider]  Melatonin 10 MG TABS Take 10 mg by mouth at bedtime.   Yes [provider]  meloxicam (MOBIC) 15 MG tablet Take 15 mg by mouth daily as needed. 01/13/24  Yes [provider]   omeprazole (PRILOSEC) 40 MG capsule Take 40 mg by mouth daily. 30 minutes before a meal   Yes [provider]  ondansetron  (ZOFRAN -ODT) 4 MG disintegrating tablet Take 1 tablet (4 mg total) by mouth every 8 (eight) hours as needed for nausea or vomiting. 12/07/23  Yes Mordecai Applebaum, MD  pravastatin  (PRAVACHOL ) 40 MG tablet Take 40 mg by mouth daily. 04/15/21  Yes [provider]  predniSONE  (DELTASONE ) 10 MG tablet Take 10 mg by mouth daily. 12/31/23  Yes [provider]  propranolol  ER (INDERAL  LA) 60 MG 24 hr capsule TAKE 1 CAPSULE DAILY-CALL & SCHEDULE APPOINTMENT FOR MARCH 2024 OR SOONER IF SYMPTOMS CHANGE 07/06/23  Yes McCue, Camilo Cella, NP  traMADol  (ULTRAM ) 50 MG tablet Take 50 mg by mouth 3 (three) times daily as needed for moderate pain (pain score 4-6). 04/04/23  Yes [provider]  traZODone  (DESYREL ) 50 MG tablet Take 50 mg by mouth at bedtime. 02/13/21  Yes [provider]  metroNIDAZOLE  (FLAGYL ) 500 MG tablet Take 1 tablet (500 mg total) by mouth 2 (two) times daily with a meal. DO NOT CONSUME ALCOHOL WHILE TAKING THIS MEDICATION. Patient not taking: Reported on 02/09/2024 01/13/24   Rayfield Cairo, MD    PRN MEDs: acetaminophen  **OR** acetaminophen , bisacodyl, hydrALAZINE , HYDROmorphone  (DILAUDID ) injection, ipratropium, levalbuterol, ondansetron  **OR** ondansetron  (ZOFRAN ) IV, oxyCODONE , sodium phosphate , traZODone   Past Medical History:  Diagnosis Date   Anxiety and depression    Chronic renal insufficiency, stage 3 (moderate) (HCC)    DDD (degenerative disc disease), lumbar    remote hx of fusion surgery   Dysrhythmia    isolated afib/flutter   GERD (gastroesophageal reflux disease)    Gout    History of stomach ulcers 2000   Hyperlipidemia    Hypertension    NAUSEA WITH VOMITING 10/15/2010   Rheumatoid arthritis (HCC)    Rheumatoid     Past Surgical History:  Procedure Laterality Date   APPENDECTOMY  1968   back  fusion  2000   CHOLECYSTECTOMY N/A 06/06/2017   Procedure: LAPAROSCOPIC CHOLECYSTECTOMY WITH INTRAOPERATIVE CHOLANGIOGRAM;  Surgeon: Oralee Billow, MD;  Location: [facility] ORS;  Service: General;  Laterality: N/A;   CRANIOTOMY N/A 10/20/2022   Procedure: TRANSSPHENOIDAL RESECTION OF PITUITARY TUMOR;  Surgeon: Garry Kansas, MD;  Location[facility] OR;  Service: Neurosurgery;  Laterality: N/A;   EYE SURGERY     Dr. Gennie Kicks  lens implants   LUMBAR LAMINECTOMY  2000   NASAL SEPTOPLASTY W/ TURBINOPLASTY Bilateral 10/20/2022   Procedure: NASAL SEPTOPLASTY;  Surgeon: Daleen Dubs, DO;  Location: [facility] OR;  Service: ENT;  Laterality: Bilateral;   TIBIA FRACTURE SURGERY Left 1998   with titanium rods   TRANSPHENOIDAL APPROACH EXPOSURE Bilateral 10/20/2022   Procedure: TRANSPHENOIDAL APPROACH EXPOSURE;  Surgeon: Daleen Dubs, DO;  Location: [facility] OR;  Service: ENT;  Laterality: Bilateral;   UMBILICAL HERNIA REPAIR N/A 06/06/2017   Procedure: UMBILICAL HERNIA REPAIR;  Surgeon: Sofia Dunn,  Ena Harries, MD;  Location: [facility];  Service: General;  Laterality: N/A;     reports that he quit smoking about 45 years ago. His smoking use included cigarettes. He started smoking about 65 years ago. He has a 40 pack-year smoking history. He has never used smokeless tobacco. He reports that he does not drink alcohol and does not use drugs.   Family History  Problem Relation Age of Onset   Cancer Mother    CVA Mother    Cancer Maternal Grandmother    Healthy Son    Colon cancer Neg Hx     Physical Exam:   Vitals:   02/09/24 1400 02/09/24 1430 02/09/24 1500 02/09/24 1600  BP: 134/73 135/75 134/74 (!) 152/78  Pulse:      Resp: 16 18 17 20   Temp:    97.6 F (36.4 C)  TempSrc:    Oral  SpO2:    99%  Weight:      Height:       Constitutional: NAD, calm, comfortable Eyes: PERRL, lids and conjunctivae normal ENMT: Mucous membranes are moist. Posterior pharynx clear of any exudate or lesions.Normal dentition.   Neck: normal, supple, no masses, no thyromegaly Respiratory: clear to auscultation bilaterally, no wheezing, no crackles. Normal respiratory effort. No accessory muscle use.  Cardiovascular: Regular rate and rhythm, no murmurs / rubs / gallops. No extremity edema. 2+ pedal pulses. No carotid bruits.  Abdomen: no tenderness, no masses palpated. No hepatosplenomegaly. Bowel sounds positive.  Musculoskeletal: no clubbing / cyanosis. No joint deformity upper and lower extremities. Good ROM, no contractures. Normal muscle tone.  Neurologic: CN II-XII grossly intact. Sensation intact, DTR normal. Strength 5/5 in all 4.  Psychiatric: Normal judgment and insight. Alert and oriented x 3. Normal mood.  Skin: no rashes, lesions, ulcers. No induration    Labs on admission:    I have personally reviewed following labs and imaging studies  CBC: Recent Labs  Lab 02/09/24 1008  WBC 11.7*  HGB 9.8*  HCT 31.0*  MCV 80.7  PLT 361   Basic Metabolic Panel: Recent Labs  Lab 02/09/24 1008 02/09/24 1451  NA 137  --   K 4.0  --   CL 106  --   CO2 21*  --   GLUCOSE 115*  --   BUN 47*  --   CREATININE 3.08*  --   CALCIUM 7.9*  --   MG  --  2.3  PHOS  --  3.2   GFR: Estimated Creatinine Clearance: 18 mL/min (A) (by C-G formula based on SCr of 3.08 mg/dL (H)). Liver Function Tests: Recent Labs  Lab 02/09/24 1008  AST 21  ALT 18  ALKPHOS 61  BILITOT 0.5  PROT 6.2*  ALBUMIN  2.8*   Recent Labs  Lab 02/09/24 1008  LIPASE 61*   No results for input(s): "AMMONIA" in the last 168 hours.  Urine analysis:    Component Value Date/Time   COLORURINE YELLOW 02/09/2024 1230   APPEARANCEUR CLEAR 02/09/2024 1230   LABSPEC 1.016 02/09/2024 1230   PHURINE 5.0 02/09/2024 1230   GLUCOSEU NEGATIVE 02/09/2024 1230   HGBUR NEGATIVE 02/09/2024 1230   BILIRUBINUR NEGATIVE 02/09/2024 1230   KETONESUR NEGATIVE 02/09/2024 1230   PROTEINUR 30 (A) 02/09/2024 1230   NITRITE NEGATIVE 02/09/2024 1230    LEUKOCYTESUR NEGATIVE 02/09/2024 1230    Last A1C:  Lab Results  Component Value Date   HGBA1C 5.8 (H) 08/26/2022     Radiologic Exams on Admission:   CT  ABDOMEN PELVIS WO CONTRAST Result Date: 02/09/2024 CLINICAL DATA:  Acute abdominal pain EXAM: CT ABDOMEN AND PELVIS WITHOUT CONTRAST TECHNIQUE: Multidetector CT imaging of the abdomen and pelvis was performed following the standard protocol without IV contrast. RADIATION DOSE REDUCTION: This exam was performed according to the departmental dose-optimization program which includes automated exposure control, adjustment of the mA and/or kV according to patient size and/or use of iterative reconstruction technique. COMPARISON:  Jan 09, 2024 FINDINGS: Lower chest: No infiltrates or consolidations, no pleural effusions Hepatobiliary: No focal liver abnormality is seen. Status post cholecystectomy. No biliary dilatation. Pancreas: Pancreas normal size. No masses calcifications or inflammatory changes. Spleen: Spleen normal size.  No masses. Adrenals/Urinary Tract: Adrenal glands are normal size. Follow-up recommended. Kidneys are normal. No masses calcifications or hydronephrosis Stomach/Bowel: No small or large bowel obstruction or inflammatory changes. Diffuse diverticulosis of the sigmoid colon no definitive evidence of diverticulitis. Moderate amount of residual fecal material throughout the colon without obstruction or constipation. Vascular/Lymphatic: Aortic atherosclerosis. No enlarged abdominal or pelvic lymph nodes. No aneurysms, maximum AP diameter abdominal aorta is 2.6 cm. Reproductive: .  No masses. Bladder unremarkable. Other: Anterior abdominal wall unremarkable without evidence of umbilical or inguinal hernias Musculoskeletal: Visualized portion of the thoracolumbar spine and pelvic structures grossly unremarkable without evidence of fracture bony abnormalities or soft tissue masses. IMPRESSION: *No acute findings in the abdomen or pelvis.  *Diffuse diverticulosis of the sigmoid colon without definitive evidence of diverticulitis. *Moderate amount of residual fecal material throughout the colon without obstruction or constipation. *Status post cholecystectomy. *Aortic atherosclerosis. Electronically Signed   By: Fredrich Jefferson M.D.   On: 02/09/2024 13:03   DG Chest 2 View Result Date: 02/09/2024 CLINICAL DATA:  Chest pain. EXAM: CHEST - 2 VIEW COMPARISON:  01/09/2024. FINDINGS: There are probable atelectatic changes/scarring at the lung bases. Bilateral lung fields are otherwise clear. No acute consolidation or lung collapse. Bilateral costophrenic angles are clear. Normal cardio-mediastinal silhouette. No acute osseous abnormalities. The soft tissues are within normal limits. There are surgical clips in the right upper quadrant, typical of a previous cholecystectomy. IMPRESSION: No active cardiopulmonary disease. Electronically Signed   By: Beula Brunswick M.D.   On: 02/09/2024 10:45    EKG:   Independently reviewed.  Orders placed or performed during the hospital encounter of 02/09/24   ED EKG   ED EKG   EKG 12-Lead   EKG 12-Lead   EKG 12-Lead   ---------------------------------------------------------------------------------------------------------------------------------------    Assessment / Plan:   Principal Problem:   Acute renal failure superimposed on stage 3b chronic kidney disease (HCC) Active Problems:   Dehydration   Rheumatoid arthritis (HCC)   Gastric ulcer   Diverticulosis of colon   ABDOMINAL PAIN -GENERALIZED   GERD   Nausea with vomiting   Essential hypertension   Hyperlipidemia   Anxiety and depression   Amnestic MCI (mild cognitive impairment with memory loss)   Failure to thrive in adult   Assessment and Plan: * Acute renal failure superimposed on stage 3b chronic kidney disease (HCC) - 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 hydration  Rheumatoid arthritis (HCC) - Continue home prednisone , holding fluting Mobic-due to elevated BUN/creatinine  Failure to thrive in adult Body mass index is 21.26 kg/m. - Consult nutrition, -Will add Megace once can tolerate p.o. -Dietary supplements  DNR (do not resuscitate) - Will confirm with family and patient  Amnestic MCI (mild cognitive impairment with memory loss) - With history of dementia, continue Lexapro , trazodone , Aricept   Anxiety and depression - Home medications reviewed will resume Lexapro , trazodone ,  Hyperlipidemia - Once patient tolerating p.o. we will continue statin  Essential hypertension - BP closely, resuming home medications of Norvasc , propranolol  Will hold losartan , -Utilize as needed hydralazine   Nausea with vomiting - Intractable nausea vomiting -vital gastritis versus constipation -Continue IV fluid hydration, antiemetics, -Bowel regimen  GERD - Continue PPI (may switch to IV due to abdominal)  ABDOMINAL PAIN -GENERALIZED - Seems to be chronic, with history of peptic ulcer disease -CT of abdomen reviewed, no acute pathology, chronic diverticulosis no signs of diverticulitis, - Likely exacerbated by nausea vomiting - As needed analgesics, antiemetics (IV Reglan ) - Will monitor closely  Diverticulosis of colon History of diverticulosis, relevant on CT scan no evidence of diverticulitis -Will monitor closely  Gastric ulcer - History of gastric ulcer,-presenting with abdominal pain - Continue PPI, avoiding NSAIDs    Consults called:   GI -------------------------------------------------------------------------------------------------------------------------------------------- DVT prophylaxis:  heparin  injection 5,000 Units Start: 02/09/24 2200 TED hose Start: 02/09/24 1451 SCDs Start: 02/09/24 1451   Code Status:   Code Status: Full Code   Admission status: Patient will be admitted as Observation, with a greater than 2 midnight length of stay. Level of care: Med-Surg   Family Communication:   (The above findings and plan of care has been discussed with patient in detail, the patient expressed understanding and agreement of above plan)  --------------------------------------------------------------------------------------------------------------------------------------------------  Disposition Plan:  Anticipated 1-2 days Status is: Observation The patient remains OBS appropriate and will d/c before 2 midnights.     ----------------------------------------------------------------------------------------------------------------------------------------------------  Time spent:  69  Min.  Was spent seeing and evaluating the patient, reviewing all medical records, drawn plan of care.  SIGNED: Bobbetta Burnet, MD, FHM. FAAFP. [facility], Pager  (Please use [email or web address] to page/ or secure chat through epic) If 7PM-7AM, please contact night-coverage [email or web address],  02/09/2024, 4:18 PM

## 2024-02-09 NOTE — ED Notes (Signed)
 Nurse called lab and asked about urine results. Lab stated they should be resulting now.

## 2024-02-09 NOTE — Assessment & Plan Note (Signed)
-   Intractable nausea vomiting -vital gastritis versus constipation -Continue IV fluid hydration, antiemetics, -Bowel regimen

## 2024-02-09 NOTE — Assessment & Plan Note (Addendum)
-   History of gastric ulcer,-presenting with abdominal pain - Continue PPI, avoiding NSAIDs

## 2024-02-09 NOTE — Assessment & Plan Note (Signed)
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

## 2024-02-09 NOTE — ED Notes (Signed)
 Patient transported to CT

## 2024-02-09 NOTE — Assessment & Plan Note (Signed)
-   S/p 2 L of IV fluid in ED, continue maintenance fluid -Nausea vomiting, -As needed antiemetics, including Reglan  - will encourage p.o. hydration

## 2024-02-09 NOTE — Consult Note (Addendum)
 Gastroenterology Consult   Referring Provider: No ref. provider found Primary Care Physician:  Yvonnie Heritage, NP Primary Gastroenterologist:  Dr. Elvin Hammer previously (last OV 2018)  Patient ID: [name]; [id number]; 09-Aug-1946   Admit date: 02/09/2024  LOS: 0 days   Date of Consultation: 02/09/2024  Reason for Consultation:  Intractable nausea, vomiting, abdominal pain.  History of Present Illness   [name] is a 78 y.o. 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  Reported mild shortness of breath as well as progressive nausea/vomiting with worsening pain with position changes, primary periumbilical and upper abdominal pain without radiation.  GI consulted for further evaluation.   ED Course: Vital signs stable WBC 11.7  Hgb 9.8 (9.3 on 5/10, 8.7 on 5/8 and 11.6 on 5/6), hemoglobin 9.8, creatinine 3.08, GFR 20, lactate 1.1, albumin  2.8.  Normal LFTs. lipase 61 (has been elevated since April) Iron panel pending. CT A/P without contrast: Cholecystectomy, normal pancreas and liver.  Diffuse diverticulosis without definitive evidence of diverticulitis.  Moderate residual fecal material throughout colon without obstruction or constipation  Consult:  Spoke with wife briefly, she states that his recent abdominal pain does not seem to be worsening recent.  Had worsening abdominal pain during his diverticulitis episode.  Denies any changes in stools.  She does confirm that his last upper endoscopy was a very long time ago and nothing performed recently.  Patient states that he has taken some NSAIDs as of recently,  not sure any particular NSAID.  Does report he does take pantoprazole  once daily as well as prednisone  10 mg once daily and baby aspirin  daily.  He does report some epigastric discomfort but more so has some substernal chest pain/discomfort.  He does confirm multiple antibiotics recently for his diverticulitis episodes.  Continues to have some soreness to the lower abdomen but nothing consistently.  Denies any constipation or diarrhea, denies needing to strain with bowel movements.  Denies any recent weight loss or changes in appetite other than when this nausea and vomiting started, states it has been progressively worsening over the last several weeks.  Does state that he has some decreased urination as of late.  Denies any hematemesis or coffee-ground emesis.  Last outpatient office visit with GI was in 2018 where he reported history of chronic left upper quadrant pain that usually occurred after eating and can last about an hour.  He had recently changed his diet and was eating more bland foods and stop drinking sweet tea and thought that was helping.  He denies any aspirin , NSAIDs, or EtOH at that time.  Was maintained on pantoprazole  40 mg chronically.  He had reported some recent weight loss at that time given a flare of nausea and vomiting.  Reviewed his PTA meds, he takes 81 mg aspirin  daily, meloxicam 15 mg daily as needed, prednisone  10 mg daily.  Not currently on any blood thinners.  Review of chart he had ED visit 4/30 for generalized abdominal pain, ED visit 4/27 for diverticulitis, and hospital admission 5/6 for diverticulitis.  During his hospital admission he was treated with IV Cipro  and Flagyl  given he failed outpatient Augmentin .  CT A/P without  contrast 01/09/24: - Changes sigmoid diverticulitis seen again with stable.  No evidence of abscess or definitive perforation  CT C/A/P 12/31/2023: - 5 mm irregular nodule of the anterior left lobe, likely postinfectious/amatory scarring -  Normal pancreas and spleen - Sigmoid diverticulosis with mild pericolonic stranding suggesting mild sigmoid diverticulitis  CT A/P without contrast 12/07/2023: -3 cm infrarenal abdominal aortic aneurysm, recommend follow-up ultrasound every 3 years - Sigmoid diverticulosis without inflammation  Colonoscopy November 2018 by Dr. Elvin Hammer: - Four 1-4 mm polyps in the sigmoid, transverse, ascending, and cecum - Left-sided diverticulosis - Internal hemorrhoids - Path: Ascending serrated adenoma, others are hyperplastic and benign mucosa. - Recommended repeat in 5 years.  EGD April 2010 with evidence of esophagitis.  EGD 2005 with evidence of healed gastric and duodenal ulcer.  Past Medical History:  Diagnosis Date   Anxiety and depression    Chronic renal insufficiency, stage 3 (moderate) (HCC)    DDD (degenerative disc disease), lumbar    remote hx of fusion surgery   Dysrhythmia    isolated afib/flutter   GERD (gastroesophageal reflux disease)    Gout    History of stomach ulcers 2000   Hyperlipidemia    Hypertension    NAUSEA WITH VOMITING 10/15/2010   Rheumatoid arthritis (HCC)    Rheumatoid     Past Surgical History:  Procedure Laterality Date   APPENDECTOMY  1968   back fusion  2000   CHOLECYSTECTOMY N/A 06/06/2017   Procedure: LAPAROSCOPIC CHOLECYSTECTOMY WITH INTRAOPERATIVE CHOLANGIOGRAM;  Surgeon: Oralee Billow, MD;  Location: [facility];  Service: General;  Laterality: N/A;   CRANIOTOMY N/A 10/20/2022   Procedure: TRANSSPHENOIDAL RESECTION OF PITUITARY TUMOR;  Surgeon: Garry Kansas, MD;  Location: [facility] OR;  Service: Neurosurgery;  Laterality: N/A;   EYE SURGERY     Dr. Gennie Kicks  lens implants   LUMBAR LAMINECTOMY  2000   NASAL SEPTOPLASTY W/ TURBINOPLASTY Bilateral 10/20/2022   Procedure: NASAL SEPTOPLASTY;  Surgeon: Daleen Dubs, DO;  Location: [facility] OR;  Service: ENT;  Laterality: Bilateral;   TIBIA FRACTURE SURGERY Left 1998   with titanium rods   TRANSPHENOIDAL  APPROACH EXPOSURE Bilateral 10/20/2022   Procedure: TRANSPHENOIDAL APPROACH EXPOSURE;  Surgeon: Daleen Dubs, DO;  Location: [facility] OR;  Service: ENT;  Laterality: Bilateral;   UMBILICAL HERNIA REPAIR N/A 06/06/2017   Procedure: UMBILICAL HERNIA REPAIR;  Surgeon: Oralee Billow, MD;  Location: [facility];  Service: General;  Laterality: N/A;    Prior to Admission medications   Medication Sig Start Date End Date Taking? Authorizing Provider  acetaminophen  (TYLENOL ) 500 MG tablet Take 1 tablet (500 mg total) by mouth every 6 (six) hours as needed for mild pain or headache. 12/28/21  Yes Sheikh, Omair Latif, DO  alendronate (FOSAMAX) 70 MG tablet Take 70 mg by mouth once a week. 12/21/23  Yes [provider]  amLODipine  (NORVASC ) 5 MG tablet Take 1 tablet (5 mg total) by mouth daily. 11/21/23 11/15/24 Yes BranchJoyceann No, MD  aspirin  EC 81 MG tablet Take 81 mg by mouth daily. Swallow whole.   Yes [provider]  donepezil  (ARICEPT ) 5 MG tablet TAKE 1 TABLET BY MOUTH EVERYDAY AT BEDTIME 02/05/24  Yes Glory Larsen, MD  escitalopram  (LEXAPRO ) 20 MG tablet Take 20 mg by mouth at bedtime.   Yes [provider]  HYDROcodone -acetaminophen  (NORCO/VICODIN) 5-325 MG tablet Take 1 tablet by mouth 2 (two) times daily as needed. 02/07/24  Yes [provider]  levothyroxine  (SYNTHROID ) 25 MCG tablet  Take 12.5 mcg by mouth daily before breakfast.   Yes [provider]  losartan  (COZAAR ) 100 MG tablet Take 100 mg by mouth daily.   Yes [provider]  Melatonin 10 MG TABS Take 10 mg by mouth at bedtime.   Yes [provider]  meloxicam (MOBIC) 15 MG tablet Take 15 mg by mouth daily as needed. 01/13/24  Yes [provider]  omeprazole (PRILOSEC) 40 MG capsule Take 40 mg by mouth daily. 30 minutes before a meal   Yes [provider]  ondansetron  (ZOFRAN -ODT) 4 MG disintegrating tablet Take 1 tablet (4 mg total) by mouth every 8 (eight) hours  as needed for nausea or vomiting. 12/07/23  Yes Mordecai Applebaum, MD  pravastatin  (PRAVACHOL ) 40 MG tablet Take 40 mg by mouth daily. 04/15/21  Yes [provider]  predniSONE  (DELTASONE ) 10 MG tablet Take 10 mg by mouth daily. 12/31/23  Yes [provider]  propranolol  ER (INDERAL  LA) 60 MG 24 hr capsule TAKE 1 CAPSULE DAILY-CALL & SCHEDULE APPOINTMENT FOR MARCH 2024 OR SOONER IF SYMPTOMS CHANGE 07/06/23  Yes McCue, Camilo Cella, NP  traMADol  (ULTRAM ) 50 MG tablet Take 50 mg by mouth 3 (three) times daily as needed for moderate pain (pain score 4-6). 04/04/23  Yes [provider]  traZODone  (DESYREL ) 50 MG tablet Take 50 mg by mouth at bedtime. 02/13/21  Yes [provider]  metroNIDAZOLE  (FLAGYL ) 500 MG tablet Take 1 tablet (500 mg total) by mouth 2 (two) times daily with a meal. DO NOT CONSUME ALCOHOL WHILE TAKING THIS MEDICATION. Patient not taking: Reported on 02/09/2024 01/13/24   Rayfield Cairo, MD    Current Facility-Administered Medications  Medication Dose Route Frequency Provider Last Rate Last Admin   0.9 %  sodium chloride  infusion   Intravenous Continuous Shahmehdi, Seyed A, MD       acetaminophen  (TYLENOL ) tablet 650 mg  650 mg Oral Q6H PRN Shahmehdi, Constantino Demark, MD       Or   acetaminophen  (TYLENOL ) suppository 650 mg  650 mg Rectal Q6H PRN Shahmehdi, Seyed A, MD       alum & mag hydroxide-simeth (MAALOX/MYLANTA) 200-200-20 MG/5ML suspension 15 mL  15 mL Oral Q8H Shahmehdi, Seyed A, MD       [START ON 02/10/2024] amLODipine  (NORVASC ) tablet 5 mg  5 mg Oral Daily Shahmehdi, Seyed A, MD       bisacodyl (DULCOLAX) EC tablet 5 mg  5 mg Oral Daily PRN Shahmehdi, Seyed A, MD       escitalopram  (LEXAPRO ) tablet 20 mg  20 mg Oral QHS Shahmehdi, Seyed A, MD       heparin  injection 5,000 Units  5,000 Units Subcutaneous Q8H Shahmehdi, Seyed A, MD       hydrALAZINE  (APRESOLINE ) injection 10 mg  10 mg Intravenous Q4H PRN Shahmehdi, Seyed A, MD       HYDROmorphone   (DILAUDID ) injection 1 mg  1 mg Intravenous Q2H PRN Shahmehdi, Seyed A, MD       ipratropium (ATROVENT) nebulizer solution 0.5 mg  0.5 mg Nebulization Q6H PRN Shahmehdi, Seyed A, MD       levalbuterol (XOPENEX) nebulizer solution 0.63 mg  0.63 mg Nebulization Q6H PRN Shahmehdi, Seyed A, MD       [START ON 02/10/2024] levothyroxine  (SYNTHROID ) tablet 12.5 mcg  12.5 mcg Oral QAC breakfast Shahmehdi, Seyed A, MD       melatonin tablet 9 mg  9 mg Oral QHS Shahmehdi, Seyed A, MD  metoCLOPramide  (REGLAN ) injection 10 mg  10 mg Intravenous Q12H Shahmehdi, Seyed A, MD       ondansetron  (ZOFRAN ) tablet 4 mg  4 mg Oral Q6H PRN Shahmehdi, Seyed A, MD       Or   ondansetron  (ZOFRAN ) injection 4 mg  4 mg Intravenous Q6H PRN Shahmehdi, Seyed A, MD       oxyCODONE  (Oxy IR/ROXICODONE ) immediate release tablet 5 mg  5 mg Oral Q4H PRN Amber Bail A, MD   5 mg at 02/09/24 1538   pantoprazole  (PROTONIX ) injection 40 mg  40 mg Intravenous Q12H Shahmehdi, Constantino Demark, MD       [START ON 02/10/2024] predniSONE  (DELTASONE ) tablet 10 mg  10 mg Oral Q breakfast Shahmehdi, Constantino Demark, MD       [START ON 02/10/2024] propranolol  ER (INDERAL  LA) 24 hr capsule 60 mg  60 mg Oral Daily Shahmehdi, Seyed A, MD       sennosides (SENOKOT) 8.8 MG/5ML syrup 10 mL  10 mL Oral BID Shahmehdi, Seyed A, MD       sodium chloride  flush (NS) 0.9 % injection 3 mL  3 mL Intravenous Q12H Shahmehdi, Seyed A, MD       sodium chloride  flush (NS) 0.9 % injection 3 mL  3 mL Intravenous Q12H Shahmehdi, Seyed A, MD       sodium phosphate  (FLEET) enema 1 enema  1 enema Rectal Once PRN Shahmehdi, Seyed A, MD       traZODone  (DESYREL ) tablet 25 mg  25 mg Oral QHS PRN Bobbetta Burnet, MD        Allergies as of 02/09/2024   (No Known Allergies)    Family History  Problem Relation Age of Onset   Cancer Mother    CVA Mother    Cancer Maternal Grandmother    Healthy Son    Colon cancer Neg Hx     Social History   Socioeconomic History   Marital  status: Married    Spouse name: Ova Bloomer   Number of children: 1   Years of education: Not on file   Highest education level: Not on file  Occupational History   Not on file  Tobacco Use   Smoking status: Former    Current packs/day: 0.00    Average packs/day: 2.0 packs/day for 20.0 years (40.0 ttl pk-yrs)    Types: Cigarettes    Start date: 43    Quit date: 1980    Years since quitting: 45.4   Smokeless tobacco: Never   Tobacco comments:    Stopped early 29s  Vaping Use   Vaping status: Never Used  Substance and Sexual Activity   Alcohol use: No   Drug use: No   Sexual activity: Yes  Other Topics Concern   Not on file  Social History Narrative   Lives at home with spouse   Right handed   Caffeine : 2 cups/day, tea seldom   Social Drivers of Corporate investment banker Strain: Not on file  Food Insecurity: No Food Insecurity (01/09/2024)   Hunger Vital Sign    Worried About Running Out of Food in the Last Year: Never true    Ran Out of Food in the Last Year: Never true  Transportation Needs: No Transportation Needs (01/09/2024)   PRAPARE - Administrator, Civil Service (Medical): No    Lack of Transportation (Non-Medical): No  Physical Activity: Not on file  Stress: Not on file  Social Connections: Moderately Isolated (  01/11/2024)   Social Connection and Isolation Panel [NHANES]    Frequency of Communication with Friends and Family: More than three times a week    Frequency of Social Gatherings with Friends and Family: More than three times a week    Attends Religious Services: Never    Database administrator or Organizations: No    Attends Banker Meetings: Never    Marital Status: Married  Catering manager Violence: Not At Risk (01/09/2024)   Humiliation, Afraid, Rape, and Kick questionnaire    Fear of Current or Ex-Partner: No    Emotionally Abused: No    Physically Abused: No    Sexually Abused: No     Review of Systems   Gen:  Denies any fever, chills, loss of appetite, change in weight or weight loss CV: Denies chest pain, heart palpitations, syncope, edema  Resp: Denies shortness of breath with rest, cough, wheezing, coughing up blood, and pleurisy. GI: Denies vomiting blood, jaundice, and fecal incontinence.   Denies dysphagia or odynophagia. GU : Denies urinary burning, blood in urine, urinary frequency, and urinary incontinence. MS: Denies joint pain, limitation of movement, swelling, cramps, and atrophy.  Derm: Denies rash, itching, dry skin, hives. Psych: Denies depression, anxiety, memory loss, hallucinations, and confusion. Heme: Denies bruising or bleeding Neuro:  Denies any headaches, dizziness, paresthesias, shaking  Physical Exam   Vital Signs in last 24 hours: Temp:  [97.6 F (36.4 C)] 97.6 F (36.4 C) (06/06 1600) Pulse Rate:  [58-64] 62 (06/06 1300) Resp:  [13-22] 20 (06/06 1600) BP: (105-179)/(69-78) 152/78 (06/06 1600) SpO2:  [96 %-100 %] 99 % (06/06 1600) Weight:  [65.3 kg] 65.3 kg (06/06 0953)    General:   Alert, chronically ill-appearing, pleasant and cooperative in NAD Head:  Normocephalic and atraumatic. Eyes:  Sclera clear, no icterus.   Conjunctiva pink. Ears:  Normal auditory acuity. Lungs:  Clear throughout to auscultation.  No wheezes, crackles, or rhonchi. No acute distress. Heart:  Regular rate and rhythm; no murmurs, clicks, rubs,  or gallops. Abdomen:  Soft,  nondistended.  Mild TTP to periumbilical region and epigastric region.  No masses, hepatosplenomegaly or hernias noted. Normal bowel sounds, without guarding, and without rebound.   Rectal: Deferred. Msk:  Symmetrical without gross deformities. Normal posture. Neurologic:  Alert and  oriented x4. Skin: Small amount of exposed skin intact, thin skin. Psych:  Alert and cooperative. Normal mood and affect.  Intake/Output from previous day: No intake/output data recorded. Intake/Output this shift: No intake/output  data recorded.   Labs/Studies   Recent Labs Recent Labs    02/09/24 1008  WBC 11.7*  HGB 9.8*  HCT 31.0*  PLT 361   BMET Recent Labs    02/09/24 1008  NA 137  K 4.0  CL 106  CO2 21*  GLUCOSE 115*  BUN 47*  CREATININE 3.08*  CALCIUM 7.9*   LFT Recent Labs    02/09/24 1008  PROT 6.2*  ALBUMIN  2.8*  AST 21  ALT 18  ALKPHOS 61  BILITOT 0.5   PT/INR No results for input(s): "LABPROT", "INR" in the last 72 hours. Hepatitis Panel No results for input(s): "HEPBSAG", "HCVAB", "HEPAIGM", "HEPBIGM" in the last 72 hours. C-Diff No results for input(s): "CDIFFTOX" in the last 72 hours.  Radiology/Studies CT ABDOMEN PELVIS WO CONTRAST Result Date: 02/09/2024 CLINICAL DATA:  Acute abdominal pain EXAM: CT ABDOMEN AND PELVIS WITHOUT CONTRAST TECHNIQUE: Multidetector CT imaging of the abdomen and pelvis was performed following the standard  protocol without IV contrast. RADIATION DOSE REDUCTION: This exam was performed according to the departmental dose-optimization program which includes automated exposure control, adjustment of the mA and/or kV according to patient size and/or use of iterative reconstruction technique. COMPARISON:  Jan 09, 2024 FINDINGS: Lower chest: No infiltrates or consolidations, no pleural effusions Hepatobiliary: No focal liver abnormality is seen. Status post cholecystectomy. No biliary dilatation. Pancreas: Pancreas normal size. No masses calcifications or inflammatory changes. Spleen: Spleen normal size.  No masses. Adrenals/Urinary Tract: Adrenal glands are normal size. Follow-up recommended. Kidneys are normal. No masses calcifications or hydronephrosis Stomach/Bowel: No small or large bowel obstruction or inflammatory changes. Diffuse diverticulosis of the sigmoid colon no definitive evidence of diverticulitis. Moderate amount of residual fecal material throughout the colon without obstruction or constipation. Vascular/Lymphatic: Aortic atherosclerosis. No  enlarged abdominal or pelvic lymph nodes. No aneurysms, maximum AP diameter abdominal aorta is 2.6 cm. Reproductive: .  No masses. Bladder unremarkable. Other: Anterior abdominal wall unremarkable without evidence of umbilical or inguinal hernias Musculoskeletal: Visualized portion of the thoracolumbar spine and pelvic structures grossly unremarkable without evidence of fracture bony abnormalities or soft tissue masses. IMPRESSION: *No acute findings in the abdomen or pelvis. *Diffuse diverticulosis of the sigmoid colon without definitive evidence of diverticulitis. *Moderate amount of residual fecal material throughout the colon without obstruction or constipation. *Status post cholecystectomy. *Aortic atherosclerosis. Electronically Signed   By: Fredrich Jefferson M.D.   On: 02/09/2024 13:03   DG Chest 2 View Result Date: 02/09/2024 CLINICAL DATA:  Chest pain. EXAM: CHEST - 2 VIEW COMPARISON:  01/09/2024. FINDINGS: There are probable atelectatic changes/scarring at the lung bases. Bilateral lung fields are otherwise clear. No acute consolidation or lung collapse. Bilateral costophrenic angles are clear. Normal cardio-mediastinal silhouette. No acute osseous abnormalities. The soft tissues are within normal limits. There are surgical clips in the right upper quadrant, typical of a previous cholecystectomy. IMPRESSION: No active cardiopulmonary disease. Electronically Signed   By: Beula Brunswick M.D.   On: 02/09/2024 10:45     Assessment   [name] is a 78 y.o. 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 GI consulted for further  evaluation.  Intractable nausea/vomiting, chronic abdominal pain: - Has had some intractable nausea and vomiting over the last several weeks, likely having some worsening dehydration given evidence of acute on chronic kidney disease - Having substernal chest pain and epigastric/periumbilical pain. - Denies overt constipation or diarrhea. - CT A/P this admission with evidence of cholecystectomy and with normal pancreas and liver - Lipase has been mildly elevated since early May although no imaging evidence of pancreatitis - Does have mild TTP to periumbilical and epigastric region today - Maintained on pantoprazole  40 mg once daily at home - Does report some NSAID use as of recent.  Is also maintained on prednisone  10 mg daily for chronic adrenal insufficiency.  Is not specific in which instead he uses but states it is occasional and not frequent. - Per review of records he has history of peptic ulcer disease, EGD 2005 he had evidence of healed gastric and duodenal ulcer. - Will increase PPI to twice daily - Discussed EGD with patient and wife  today, they are in agreeance to complete tomorrow.  Differentials include : Esophagitis  (Candida highly likely given recent antibiotics), gastritis, duodenitis, peptic ulcer disease, etc.  Advised that Dr. Riley Cheadle will be performing procedure, tentative plan for 10 AM.  Will place n.p.o. at midnight.  Okay for clear liquids this afternoon.  I have discussed the risks, alternatives, benefits with regards to but not limited to the risk of reaction to medication, bleeding, infection, perforation and the patient is agreeable to proceed. Written consent to be obtained.  Recent diverticulitis: - Last colonoscopy in 2018 with evidence of diverticulosis and removal of 4 polyps. - Currently overdue for surveillance. - In need of repeat colonoscopy as outpatient for colon cancer surveillance and evaluation after recent diverticulitis.  Anemia: - Hemoglobin stable in  the 9 range currently. - Likely multifactorial in the setting of CKD and recent acute illness. - Possibly could have drop in hemoglobin tomorrow given he is receiving IV hydration - Denies any evidence of overt GI bleeding. - As stated above maintained on low-dose steroids and occasional NSAID use - Will perform EGD tomorrow for evaluation of recent acute GI complaints - Will need to consider outpatient colonoscopy.  Plan / Recommendations   PPI twice daily CBC and CMP tomorrow Clear liquids today N.p.o. at midnight, plan for EGD tomorrow with Dr. Riley Cheadle Hold morning subcutaneous heparin  dose -notified attending. Continue antiemetics as needed per hospitalist Outpatient colonoscopy    02/09/2024, 4:13 PM  Julian Obey, MSN, FNP-BC, AGACNP-BC [facility]

## 2024-02-09 NOTE — Assessment & Plan Note (Signed)
-   Once patient tolerating p.o. we will continue statin

## 2024-02-09 NOTE — H&P (View-Only) (Signed)
 Gastroenterology Consult   Referring Provider: No ref. provider found Primary Care Physician:  Yvonnie Heritage, NP Primary Gastroenterologist:  Dr. Elvin Hammer previously (last OV 2018)  Patient ID: [name]; [id number]; 09-Aug-1946   Admit date: 02/09/2024  LOS: 0 days   Date of Consultation: 02/09/2024  Reason for Consultation:  Intractable nausea, vomiting, abdominal pain.  History of Present Illness   [name] is a 78 y.o. 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  Reported mild shortness of breath as well as progressive nausea/vomiting with worsening pain with position changes, primary periumbilical and upper abdominal pain without radiation.  GI consulted for further evaluation.   ED Course: Vital signs stable WBC 11.7  Hgb 9.8 (9.3 on 5/10, 8.7 on 5/8 and 11.6 on 5/6), hemoglobin 9.8, creatinine 3.08, GFR 20, lactate 1.1, albumin  2.8.  Normal LFTs. lipase 61 (has been elevated since April) Iron panel pending. CT A/P without contrast: Cholecystectomy, normal pancreas and liver.  Diffuse diverticulosis without definitive evidence of diverticulitis.  Moderate residual fecal material throughout colon without obstruction or constipation  Consult:  Spoke with wife briefly, she states that his recent abdominal pain does not seem to be worsening recent.  Had worsening abdominal pain during his diverticulitis episode.  Denies any changes in stools.  She does confirm that his last upper endoscopy was a very long time ago and nothing performed recently.  Patient states that he has taken some NSAIDs as of recently,  not sure any particular NSAID.  Does report he does take pantoprazole  once daily as well as prednisone  10 mg once daily and baby aspirin  daily.  He does report some epigastric discomfort but more so has some substernal chest pain/discomfort.  He does confirm multiple antibiotics recently for his diverticulitis episodes.  Continues to have some soreness to the lower abdomen but nothing consistently.  Denies any constipation or diarrhea, denies needing to strain with bowel movements.  Denies any recent weight loss or changes in appetite other than when this nausea and vomiting started, states it has been progressively worsening over the last several weeks.  Does state that he has some decreased urination as of late.  Denies any hematemesis or coffee-ground emesis.  Last outpatient office visit with GI was in 2018 where he reported history of chronic left upper quadrant pain that usually occurred after eating and can last about an hour.  He had recently changed his diet and was eating more bland foods and stop drinking sweet tea and thought that was helping.  He denies any aspirin , NSAIDs, or EtOH at that time.  Was maintained on pantoprazole  40 mg chronically.  He had reported some recent weight loss at that time given a flare of nausea and vomiting.  Reviewed his PTA meds, he takes 81 mg aspirin  daily, meloxicam 15 mg daily as needed, prednisone  10 mg daily.  Not currently on any blood thinners.  Review of chart he had ED visit 4/30 for generalized abdominal pain, ED visit 4/27 for diverticulitis, and hospital admission 5/6 for diverticulitis.  During his hospital admission he was treated with IV Cipro  and Flagyl  given he failed outpatient Augmentin .  CT A/P without  contrast 01/09/24: - Changes sigmoid diverticulitis seen again with stable.  No evidence of abscess or definitive perforation  CT C/A/P 12/31/2023: - 5 mm irregular nodule of the anterior left lobe, likely postinfectious/amatory scarring -  Normal pancreas and spleen - Sigmoid diverticulosis with mild pericolonic stranding suggesting mild sigmoid diverticulitis  CT A/P without contrast 12/07/2023: -3 cm infrarenal abdominal aortic aneurysm, recommend follow-up ultrasound every 3 years - Sigmoid diverticulosis without inflammation  Colonoscopy November 2018 by Dr. Elvin Hammer: - Four 1-4 mm polyps in the sigmoid, transverse, ascending, and cecum - Left-sided diverticulosis - Internal hemorrhoids - Path: Ascending serrated adenoma, others are hyperplastic and benign mucosa. - Recommended repeat in 5 years.  EGD April 2010 with evidence of esophagitis.  EGD 2005 with evidence of healed gastric and duodenal ulcer.  Past Medical History:  Diagnosis Date   Anxiety and depression    Chronic renal insufficiency, stage 3 (moderate) (HCC)    DDD (degenerative disc disease), lumbar    remote hx of fusion surgery   Dysrhythmia    isolated afib/flutter   GERD (gastroesophageal reflux disease)    Gout    History of stomach ulcers 2000   Hyperlipidemia    Hypertension    NAUSEA WITH VOMITING 10/15/2010   Rheumatoid arthritis (HCC)    Rheumatoid     Past Surgical History:  Procedure Laterality Date   APPENDECTOMY  1968   back fusion  2000   CHOLECYSTECTOMY N/A 06/06/2017   Procedure: LAPAROSCOPIC CHOLECYSTECTOMY WITH INTRAOPERATIVE CHOLANGIOGRAM;  Surgeon: Oralee Billow, MD;  Location: [facility];  Service: General;  Laterality: N/A;   CRANIOTOMY N/A 10/20/2022   Procedure: TRANSSPHENOIDAL RESECTION OF PITUITARY TUMOR;  Surgeon: Garry Kansas, MD;  Location: [facility] OR;  Service: Neurosurgery;  Laterality: N/A;   EYE SURGERY     Dr. Gennie Kicks  lens implants   LUMBAR LAMINECTOMY  2000   NASAL SEPTOPLASTY W/ TURBINOPLASTY Bilateral 10/20/2022   Procedure: NASAL SEPTOPLASTY;  Surgeon: Daleen Dubs, DO;  Location: [facility] OR;  Service: ENT;  Laterality: Bilateral;   TIBIA FRACTURE SURGERY Left 1998   with titanium rods   TRANSPHENOIDAL  APPROACH EXPOSURE Bilateral 10/20/2022   Procedure: TRANSPHENOIDAL APPROACH EXPOSURE;  Surgeon: Daleen Dubs, DO;  Location: [facility] OR;  Service: ENT;  Laterality: Bilateral;   UMBILICAL HERNIA REPAIR N/A 06/06/2017   Procedure: UMBILICAL HERNIA REPAIR;  Surgeon: Oralee Billow, MD;  Location: [facility];  Service: General;  Laterality: N/A;    Prior to Admission medications   Medication Sig Start Date End Date Taking? Authorizing Provider  acetaminophen  (TYLENOL ) 500 MG tablet Take 1 tablet (500 mg total) by mouth every 6 (six) hours as needed for mild pain or headache. 12/28/21  Yes Sheikh, Omair Latif, DO  alendronate (FOSAMAX) 70 MG tablet Take 70 mg by mouth once a week. 12/21/23  Yes [provider]  amLODipine  (NORVASC ) 5 MG tablet Take 1 tablet (5 mg total) by mouth daily. 11/21/23 11/15/24 Yes BranchJoyceann No, MD  aspirin  EC 81 MG tablet Take 81 mg by mouth daily. Swallow whole.   Yes [provider]  donepezil  (ARICEPT ) 5 MG tablet TAKE 1 TABLET BY MOUTH EVERYDAY AT BEDTIME 02/05/24  Yes Glory Larsen, MD  escitalopram  (LEXAPRO ) 20 MG tablet Take 20 mg by mouth at bedtime.   Yes [provider]  HYDROcodone -acetaminophen  (NORCO/VICODIN) 5-325 MG tablet Take 1 tablet by mouth 2 (two) times daily as needed. 02/07/24  Yes [provider]  levothyroxine  (SYNTHROID ) 25 MCG tablet  Take 12.5 mcg by mouth daily before breakfast.   Yes [provider]  losartan  (COZAAR ) 100 MG tablet Take 100 mg by mouth daily.   Yes [provider]  Melatonin 10 MG TABS Take 10 mg by mouth at bedtime.   Yes [provider]  meloxicam (MOBIC) 15 MG tablet Take 15 mg by mouth daily as needed. 01/13/24  Yes [provider]  omeprazole (PRILOSEC) 40 MG capsule Take 40 mg by mouth daily. 30 minutes before a meal   Yes [provider]  ondansetron  (ZOFRAN -ODT) 4 MG disintegrating tablet Take 1 tablet (4 mg total) by mouth every 8 (eight) hours  as needed for nausea or vomiting. 12/07/23  Yes Mordecai Applebaum, MD  pravastatin  (PRAVACHOL ) 40 MG tablet Take 40 mg by mouth daily. 04/15/21  Yes [provider]  predniSONE  (DELTASONE ) 10 MG tablet Take 10 mg by mouth daily. 12/31/23  Yes [provider]  propranolol  ER (INDERAL  LA) 60 MG 24 hr capsule TAKE 1 CAPSULE DAILY-CALL & SCHEDULE APPOINTMENT FOR MARCH 2024 OR SOONER IF SYMPTOMS CHANGE 07/06/23  Yes McCue, Camilo Cella, NP  traMADol  (ULTRAM ) 50 MG tablet Take 50 mg by mouth 3 (three) times daily as needed for moderate pain (pain score 4-6). 04/04/23  Yes [provider]  traZODone  (DESYREL ) 50 MG tablet Take 50 mg by mouth at bedtime. 02/13/21  Yes [provider]  metroNIDAZOLE  (FLAGYL ) 500 MG tablet Take 1 tablet (500 mg total) by mouth 2 (two) times daily with a meal. DO NOT CONSUME ALCOHOL WHILE TAKING THIS MEDICATION. Patient not taking: Reported on 02/09/2024 01/13/24   Rayfield Cairo, MD    Current Facility-Administered Medications  Medication Dose Route Frequency Provider Last Rate Last Admin   0.9 %  sodium chloride  infusion   Intravenous Continuous Shahmehdi, Seyed A, MD       acetaminophen  (TYLENOL ) tablet 650 mg  650 mg Oral Q6H PRN Shahmehdi, Constantino Demark, MD       Or   acetaminophen  (TYLENOL ) suppository 650 mg  650 mg Rectal Q6H PRN Shahmehdi, Seyed A, MD       alum & mag hydroxide-simeth (MAALOX/MYLANTA) 200-200-20 MG/5ML suspension 15 mL  15 mL Oral Q8H Shahmehdi, Seyed A, MD       [START ON 02/10/2024] amLODipine  (NORVASC ) tablet 5 mg  5 mg Oral Daily Shahmehdi, Seyed A, MD       bisacodyl (DULCOLAX) EC tablet 5 mg  5 mg Oral Daily PRN Shahmehdi, Seyed A, MD       escitalopram  (LEXAPRO ) tablet 20 mg  20 mg Oral QHS Shahmehdi, Seyed A, MD       heparin  injection 5,000 Units  5,000 Units Subcutaneous Q8H Shahmehdi, Seyed A, MD       hydrALAZINE  (APRESOLINE ) injection 10 mg  10 mg Intravenous Q4H PRN Shahmehdi, Seyed A, MD       HYDROmorphone   (DILAUDID ) injection 1 mg  1 mg Intravenous Q2H PRN Shahmehdi, Seyed A, MD       ipratropium (ATROVENT) nebulizer solution 0.5 mg  0.5 mg Nebulization Q6H PRN Shahmehdi, Seyed A, MD       levalbuterol (XOPENEX) nebulizer solution 0.63 mg  0.63 mg Nebulization Q6H PRN Shahmehdi, Seyed A, MD       [START ON 02/10/2024] levothyroxine  (SYNTHROID ) tablet 12.5 mcg  12.5 mcg Oral QAC breakfast Shahmehdi, Seyed A, MD       melatonin tablet 9 mg  9 mg Oral QHS Shahmehdi, Seyed A, MD  metoCLOPramide  (REGLAN ) injection 10 mg  10 mg Intravenous Q12H Shahmehdi, Seyed A, MD       ondansetron  (ZOFRAN ) tablet 4 mg  4 mg Oral Q6H PRN Shahmehdi, Seyed A, MD       Or   ondansetron  (ZOFRAN ) injection 4 mg  4 mg Intravenous Q6H PRN Shahmehdi, Seyed A, MD       oxyCODONE  (Oxy IR/ROXICODONE ) immediate release tablet 5 mg  5 mg Oral Q4H PRN Amber Bail A, MD   5 mg at 02/09/24 1538   pantoprazole  (PROTONIX ) injection 40 mg  40 mg Intravenous Q12H Shahmehdi, Constantino Demark, MD       [START ON 02/10/2024] predniSONE  (DELTASONE ) tablet 10 mg  10 mg Oral Q breakfast Shahmehdi, Constantino Demark, MD       [START ON 02/10/2024] propranolol  ER (INDERAL  LA) 24 hr capsule 60 mg  60 mg Oral Daily Shahmehdi, Seyed A, MD       sennosides (SENOKOT) 8.8 MG/5ML syrup 10 mL  10 mL Oral BID Shahmehdi, Seyed A, MD       sodium chloride  flush (NS) 0.9 % injection 3 mL  3 mL Intravenous Q12H Shahmehdi, Seyed A, MD       sodium chloride  flush (NS) 0.9 % injection 3 mL  3 mL Intravenous Q12H Shahmehdi, Seyed A, MD       sodium phosphate  (FLEET) enema 1 enema  1 enema Rectal Once PRN Shahmehdi, Seyed A, MD       traZODone  (DESYREL ) tablet 25 mg  25 mg Oral QHS PRN Bobbetta Burnet, MD        Allergies as of 02/09/2024   (No Known Allergies)    Family History  Problem Relation Age of Onset   Cancer Mother    CVA Mother    Cancer Maternal Grandmother    Healthy Son    Colon cancer Neg Hx     Social History   Socioeconomic History   Marital  status: Married    Spouse name: [name]   Number of children: 1   Years of education: Not on file   Highest education level: Not on file  Occupational History   Not on file  Tobacco Use   Smoking status: Former    Current packs/day: 0.00    Average packs/day: 2.0 packs/day for 20.0 years (40.0 ttl pk-yrs)    Types: Cigarettes    Start date: 43    Quit date: 1980    Years since quitting: 45.4   Smokeless tobacco: Never   Tobacco comments:    Stopped early 29s  Vaping Use   Vaping status: Never Used  Substance and Sexual Activity   Alcohol use: No   Drug use: No   Sexual activity: Yes  Other Topics Concern   Not on file  Social History Narrative   Lives at home with spouse   Right handed   Caffeine : 2 cups/day, tea seldom   Social Drivers of Corporate investment banker Strain: Not on file  Food Insecurity: No Food Insecurity (01/09/2024)   Hunger Vital Sign    Worried About Running Out of Food in the Last Year: Never true    Ran Out of Food in the Last Year: Never true  Transportation Needs: No Transportation Needs (01/09/2024)   PRAPARE - Administrator, Civil Service (Medical): No    Lack of Transportation (Non-Medical): No  Physical Activity: Not on file  Stress: Not on file  Social Connections: Moderately Isolated (  01/11/2024)   Social Connection and Isolation Panel [NHANES]    Frequency of Communication with Friends and Family: More than three times a week    Frequency of Social Gatherings with Friends and Family: More than three times a week    Attends Religious Services: Never    Database administrator or Organizations: No    Attends Banker Meetings: Never    Marital Status: Married  Catering manager Violence: Not At Risk (01/09/2024)   Humiliation, Afraid, Rape, and Kick questionnaire    Fear of Current or Ex-Partner: No    Emotionally Abused: No    Physically Abused: No    Sexually Abused: No     Review of Systems   Gen:  Denies any fever, chills, loss of appetite, change in weight or weight loss CV: Denies chest pain, heart palpitations, syncope, edema  Resp: Denies shortness of breath with rest, cough, wheezing, coughing up blood, and pleurisy. GI: Denies vomiting blood, jaundice, and fecal incontinence.   Denies dysphagia or odynophagia. GU : Denies urinary burning, blood in urine, urinary frequency, and urinary incontinence. MS: Denies joint pain, limitation of movement, swelling, cramps, and atrophy.  Derm: Denies rash, itching, dry skin, hives. Psych: Denies depression, anxiety, memory loss, hallucinations, and confusion. Heme: Denies bruising or bleeding Neuro:  Denies any headaches, dizziness, paresthesias, shaking  Physical Exam   Vital Signs in last 24 hours: Temp:  [97.6 F (36.4 C)] 97.6 F (36.4 C) (06/06 1600) Pulse Rate:  [58-64] 62 (06/06 1300) Resp:  [13-22] 20 (06/06 1600) BP: (105-179)/(69-78) 152/78 (06/06 1600) SpO2:  [96 %-100 %] 99 % (06/06 1600) Weight:  [65.3 kg] 65.3 kg (06/06 0953)    General:   Alert, chronically ill-appearing, pleasant and cooperative in NAD Head:  Normocephalic and atraumatic. Eyes:  Sclera clear, no icterus.   Conjunctiva pink. Ears:  Normal auditory acuity. Lungs:  Clear throughout to auscultation.  No wheezes, crackles, or rhonchi. No acute distress. Heart:  Regular rate and rhythm; no murmurs, clicks, rubs,  or gallops. Abdomen:  Soft,  nondistended.  Mild TTP to periumbilical region and epigastric region.  No masses, hepatosplenomegaly or hernias noted. Normal bowel sounds, without guarding, and without rebound.   Rectal: Deferred. Msk:  Symmetrical without gross deformities. Normal posture. Neurologic:  Alert and  oriented x4. Skin: Small amount of exposed skin intact, thin skin. Psych:  Alert and cooperative. Normal mood and affect.  Intake/Output from previous day: No intake/output data recorded. Intake/Output this shift: No intake/output  data recorded.   Labs/Studies   Recent Labs Recent Labs    02/09/24 1008  WBC 11.7*  HGB 9.8*  HCT 31.0*  PLT 361   BMET Recent Labs    02/09/24 1008  NA 137  K 4.0  CL 106  CO2 21*  GLUCOSE 115*  BUN 47*  CREATININE 3.08*  CALCIUM 7.9*   LFT Recent Labs    02/09/24 1008  PROT 6.2*  ALBUMIN  2.8*  AST 21  ALT 18  ALKPHOS 61  BILITOT 0.5   PT/INR No results for input(s): "LABPROT", "INR" in the last 72 hours. Hepatitis Panel No results for input(s): "HEPBSAG", "HCVAB", "HEPAIGM", "HEPBIGM" in the last 72 hours. C-Diff No results for input(s): "CDIFFTOX" in the last 72 hours.  Radiology/Studies CT ABDOMEN PELVIS WO CONTRAST Result Date: 02/09/2024 CLINICAL DATA:  Acute abdominal pain EXAM: CT ABDOMEN AND PELVIS WITHOUT CONTRAST TECHNIQUE: Multidetector CT imaging of the abdomen and pelvis was performed following the standard  protocol without IV contrast. RADIATION DOSE REDUCTION: This exam was performed according to the departmental dose-optimization program which includes automated exposure control, adjustment of the mA and/or kV according to patient size and/or use of iterative reconstruction technique. COMPARISON:  Jan 09, 2024 FINDINGS: Lower chest: No infiltrates or consolidations, no pleural effusions Hepatobiliary: No focal liver abnormality is seen. Status post cholecystectomy. No biliary dilatation. Pancreas: Pancreas normal size. No masses calcifications or inflammatory changes. Spleen: Spleen normal size.  No masses. Adrenals/Urinary Tract: Adrenal glands are normal size. Follow-up recommended. Kidneys are normal. No masses calcifications or hydronephrosis Stomach/Bowel: No small or large bowel obstruction or inflammatory changes. Diffuse diverticulosis of the sigmoid colon no definitive evidence of diverticulitis. Moderate amount of residual fecal material throughout the colon without obstruction or constipation. Vascular/Lymphatic: Aortic atherosclerosis. No  enlarged abdominal or pelvic lymph nodes. No aneurysms, maximum AP diameter abdominal aorta is 2.6 cm. Reproductive: .  No masses. Bladder unremarkable. Other: Anterior abdominal wall unremarkable without evidence of umbilical or inguinal hernias Musculoskeletal: Visualized portion of the thoracolumbar spine and pelvic structures grossly unremarkable without evidence of fracture bony abnormalities or soft tissue masses. IMPRESSION: *No acute findings in the abdomen or pelvis. *Diffuse diverticulosis of the sigmoid colon without definitive evidence of diverticulitis. *Moderate amount of residual fecal material throughout the colon without obstruction or constipation. *Status post cholecystectomy. *Aortic atherosclerosis. Electronically Signed   By: Fredrich Jefferson M.D.   On: 02/09/2024 13:03   DG Chest 2 View Result Date: 02/09/2024 CLINICAL DATA:  Chest pain. EXAM: CHEST - 2 VIEW COMPARISON:  01/09/2024. FINDINGS: There are probable atelectatic changes/scarring at the lung bases. Bilateral lung fields are otherwise clear. No acute consolidation or lung collapse. Bilateral costophrenic angles are clear. Normal cardio-mediastinal silhouette. No acute osseous abnormalities. The soft tissues are within normal limits. There are surgical clips in the right upper quadrant, typical of a previous cholecystectomy. IMPRESSION: No active cardiopulmonary disease. Electronically Signed   By: Beula Brunswick M.D.   On: 02/09/2024 10:45     Assessment   Arion JAYKE CAUL is a 78 y.o. 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 GI consulted for further  evaluation.  Intractable nausea/vomiting, chronic abdominal pain: - Has had some intractable nausea and vomiting over the last several weeks, likely having some worsening dehydration given evidence of acute on chronic kidney disease - Having substernal chest pain and epigastric/periumbilical pain. - Denies overt constipation or diarrhea. - CT A/P this admission with evidence of cholecystectomy and with normal pancreas and liver - Lipase has been mildly elevated since early May although no imaging evidence of pancreatitis - Does have mild TTP to periumbilical and epigastric region today - Maintained on pantoprazole  40 mg once daily at home - Does report some NSAID use as of recent.  Is also maintained on prednisone  10 mg daily for chronic adrenal insufficiency.  Is not specific in which instead he uses but states it is occasional and not frequent. - Per review of records he has history of peptic ulcer disease, EGD 2005 he had evidence of healed gastric and duodenal ulcer. - Will increase PPI to twice daily - Discussed EGD with patient and wife  today, they are in agreeance to complete tomorrow.  Differentials include : Esophagitis  (Candida highly likely given recent antibiotics), gastritis, duodenitis, peptic ulcer disease, etc.  Advised that Dr. Riley Cheadle will be performing procedure, tentative plan for 10 AM.  Will place n.p.o. at midnight.  Okay for clear liquids this afternoon.  I have discussed the risks, alternatives, benefits with regards to but not limited to the risk of reaction to medication, bleeding, infection, perforation and the patient is agreeable to proceed. Written consent to be obtained.  Recent diverticulitis: - Last colonoscopy in 2018 with evidence of diverticulosis and removal of 4 polyps. - Currently overdue for surveillance. - In need of repeat colonoscopy as outpatient for colon cancer surveillance and evaluation after recent diverticulitis.  Anemia: - Hemoglobin stable in  the 9 range currently. - Likely multifactorial in the setting of CKD and recent acute illness. - Possibly could have drop in hemoglobin tomorrow given he is receiving IV hydration - Denies any evidence of overt GI bleeding. - As stated above maintained on low-dose steroids and occasional NSAID use - Will perform EGD tomorrow for evaluation of recent acute GI complaints - Will need to consider outpatient colonoscopy.  Plan / Recommendations   PPI twice daily CBC and CMP tomorrow Clear liquids today N.p.o. at midnight, plan for EGD tomorrow with Dr. Riley Cheadle Hold morning subcutaneous heparin  dose -notified attending. Continue antiemetics as needed per hospitalist Outpatient colonoscopy    02/09/2024, 4:13 PM  Julian Obey, MSN, FNP-BC, AGACNP-BC [facility]

## 2024-02-09 NOTE — Assessment & Plan Note (Signed)
 History of diverticulosis, relevant on CT scan no evidence of diverticulitis -Will monitor closely

## 2024-02-09 NOTE — ED Notes (Signed)
 Sandwhich, crackers and coke given to pt per request .

## 2024-02-09 NOTE — Assessment & Plan Note (Deleted)
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

## 2024-02-09 NOTE — ED Notes (Signed)
 EDP at bedside during triage

## 2024-02-09 NOTE — Hospital Course (Addendum)
 [name] is a 78 year old male with extensive history of mild/early dementia, anxiety, depression, CKD 3B, GERD, gout, hyperlipidemia, hypertension, RA, degenerative disc disease, PUD,  chronic pain-especially abdominal ...  Presented to ED with intractable nausea vomiting and abdominal pain.  Reporting 2 weeks of lower chest, abdominal pain, epigastric pain.  Associated with mild shortness of breath, and progressive nausea vomiting.  He reports his overall constant pain with certain position gets worse.  Pain is mainly periumbilical/upper abdominal area.  With no radiation.  Shortness of breath is constant limited to ability mobility and exertion.  Denies of having fever, chills, cough, congestion, denies having lower extremity edema, denies of having any diarrhea.  Complains of nausea but no extensive vomiting.   ED evaluation: Blood pressure 135/75, pulse 62, temperature 97.6 F (36.4 C), temperature source Oral, resp. rate 18, height 5\' 9"  (1.753 m), weight 65.3 kg, SpO2 100%.  LABs: CBC WBC 11.7, hemoglobin 9.8, CO2 21 BUN 47 creatinine 3.08, GFR 20 calcium 7.9, glucose, troponin 10, 8.  Lactic acid 1.1,  UA pending:  CT abdomen pelvis:  *No acute findings in the abdomen or pelvis. *Diffuse diverticulosis of the sigmoid colon without definitive evidence of diverticulitis. *Moderate amount of residual fecal material throughout the colon without obstruction or constipation. *Status post cholecystectomy. *Aortic atherosclerosis.   Requested the patient to be admitted for abdominal pain, nausea likely vomiting, dehydration, and worsening kidney function.

## 2024-02-09 NOTE — Assessment & Plan Note (Signed)
-   Seems to be chronic, with history of peptic ulcer disease -CT of abdomen reviewed, no acute pathology, chronic diverticulosis no signs of diverticulitis, - Likely exacerbated by nausea vomiting - As needed analgesics, antiemetics (IV Reglan ) - Will monitor closely

## 2024-02-09 NOTE — Assessment & Plan Note (Signed)
-   Continue home prednisone , holding fluting Mobic-due to elevated BUN/creatinine

## 2024-02-09 NOTE — ED Triage Notes (Signed)
 Pt c/o pain all over. Pt stated he has had abd pain x 1 month and chest pain for days. Pt stated he is unable to breathe and can not get around the house that well. Pt oxygen above 90 % RA but nasal cannula applied for comfort.

## 2024-02-09 NOTE — Assessment & Plan Note (Signed)
-   Will confirm with family and patient

## 2024-02-09 NOTE — ED Provider Notes (Signed)
 [facility] AT [facility] Provider Note   CSN: [id number] Arrival date & time: 02/09/24  2130     History  Chief Complaint  Patient presents with   Abdominal Pain   Chest Pain    [name] is a 78 y.o. male.  HPI 78 year old male presents with multiple complaints.  He states for the past 2 weeks he has been short of breath and having chest and abdominal pain.  The chest pain is in the middle of his chest and feels dull and gets better with certain positions but overall is constant.  He is also having constant periumbilical/upper abdominal pain.  The shortness of breath is constant and is limiting his ability to get up out of bed.  No fevers or cough.  No leg swelling.  Denies any vomiting or diarrhea.  Home Medications Prior to Admission medications   Medication Sig Start Date End Date Taking? Authorizing Provider  acetaminophen  (TYLENOL ) 500 MG tablet Take 1 tablet (500 mg total) by mouth every 6 (six) hours as needed for mild pain or headache. 12/28/21  Yes Sheikh, Omair Latif, DO  alendronate (FOSAMAX) 70 MG tablet Take 70 mg by mouth once a week. 12/21/23  Yes [provider]  amLODipine  (NORVASC ) 5 MG tablet Take 1 tablet (5 mg total) by mouth daily. 11/21/23 11/15/24 Yes BranchJoyceann No, MD  aspirin  EC 81 MG tablet Take 81 mg by mouth daily. Swallow whole.   Yes [provider]  donepezil  (ARICEPT ) 5 MG tablet TAKE 1 TABLET BY MOUTH EVERYDAY AT BEDTIME 02/05/24  Yes Glory Larsen, MD  escitalopram  (LEXAPRO ) 20 MG tablet Take 20 mg by mouth at bedtime.   Yes [provider]  HYDROcodone -acetaminophen  (NORCO/VICODIN) 5-325 MG tablet Take 1 tablet by mouth 2 (two) times daily as needed. 02/07/24  Yes [provider]  levothyroxine  (SYNTHROID ) 25 MCG tablet Take 12.5 mcg by mouth daily before breakfast.   Yes [provider]  losartan  (COZAAR ) 100 MG tablet Take 100 mg by mouth daily.   Yes [provider]  Melatonin 10 MG TABS Take 10 mg by mouth at bedtime.   Yes [provider]  meloxicam (MOBIC) 15 MG tablet Take 15 mg by mouth daily as needed. 01/13/24  Yes [provider]  omeprazole (PRILOSEC) 40 MG capsule Take 40 mg by mouth daily. 30 minutes before a meal   Yes [provider]  ondansetron  (ZOFRAN -ODT) 4 MG disintegrating tablet Take 1 tablet (4 mg total) by mouth every 8 (eight) hours as needed for nausea or vomiting. 12/07/23  Yes Mordecai Applebaum, MD  pravastatin  (PRAVACHOL ) 40 MG tablet Take 40 mg by mouth daily. 04/15/21  Yes [provider]  predniSONE  (DELTASONE ) 10 MG tablet Take 10 mg by mouth daily. 12/31/23  Yes [provider]  propranolol  ER (INDERAL  LA) 60 MG 24 hr capsule TAKE 1 CAPSULE DAILY-CALL & SCHEDULE APPOINTMENT FOR MARCH 2024 OR SOONER IF SYMPTOMS CHANGE 07/06/23  Yes McCue, Camilo Cella, NP  traMADol  (ULTRAM ) 50 MG tablet Take 50 mg by mouth 3 (three) times daily as needed for moderate pain (pain score 4-6). 04/04/23  Yes [provider]  traZODone  (DESYREL ) 50 MG tablet Take 50 mg by mouth at bedtime. 02/13/21  Yes [provider]  metroNIDAZOLE  (FLAGYL ) 500 MG tablet Take 1 tablet (500 mg total) by mouth 2 (two) times daily with a meal. DO NOT CONSUME ALCOHOL WHILE TAKING THIS MEDICATION. Patient not taking:  Reported on 02/09/2024 01/13/24   Rayfield Cairo, MD      Allergies    Patient has no known allergies.    Review of Systems   Review of Systems  Constitutional:  Negative for fever.  Respiratory:  Positive for shortness of breath. Negative for cough.   Cardiovascular:  Positive for chest pain. Negative for leg swelling.  Gastrointestinal:  Positive for abdominal pain. Negative for diarrhea.    Physical Exam Updated Vital Signs BP (!) 179/75   Pulse 64   Temp 97.6 F (36.4 C) (Oral)   Resp 15   Ht 5\' 9"  (1.753 m)   Wt 65.3 kg   SpO2 98%   BMI 21.26 kg/m  Physical  Exam Vitals and nursing note reviewed.  Constitutional:      General: He is not in acute distress.    Appearance: He is well-developed. He is not ill-appearing or diaphoretic.  HENT:     Head: Normocephalic and atraumatic.  Cardiovascular:     Rate and Rhythm: Normal rate and regular rhythm.     Heart sounds: Normal heart sounds.  Pulmonary:     Effort: Pulmonary effort is normal.     Breath sounds: Normal breath sounds.  Abdominal:     Palpations: Abdomen is soft.     Tenderness: There is generalized abdominal tenderness.  Skin:    General: Skin is warm and dry.  Neurological:     Mental Status: He is alert.     ED Results / Procedures / Treatments   Labs (all labs ordered are listed, but only abnormal results are displayed) Labs Reviewed  CBC - Abnormal; Notable for the following components:      Result Value   WBC 11.7 (*)    RBC 3.84 (*)    Hemoglobin 9.8 (*)    HCT 31.0 (*)    MCH 25.5 (*)    RDW 15.9 (*)    All other components within normal limits  LIPASE, BLOOD - Abnormal; Notable for the following components:   Lipase 61 (*)    All other components within normal limits  COMPREHENSIVE METABOLIC PANEL WITH GFR - Abnormal; Notable for the following components:   CO2 21 (*)    Glucose, Bld 115 (*)    BUN 47 (*)    Creatinine, Ser 3.08 (*)    Calcium 7.9 (*)    Total Protein 6.2 (*)    Albumin  2.8 (*)    GFR, Estimated 20 (*)    All other components within normal limits  EXPECTORATED SPUTUM ASSESSMENT W GRAM STAIN, RFLX TO RESP C  URINALYSIS, ROUTINE W REFLEX MICROSCOPIC  MAGNESIUM   PHOSPHORUS  TROPONIN I (HIGH SENSITIVITY)  TROPONIN I (HIGH SENSITIVITY)    EKG EKG Interpretation Date/Time:  Friday February 09 2024 09:58:07 EDT Ventricular Rate:  63 PR Interval:  150 QRS Duration:  104 QT Interval:  455 QTC Calculation: 466 R Axis:   -19  Text Interpretation: Sinus rhythm Borderline left axis deviation RSR' in V1 or V2, right VCD or RVH no acute ST/T  changes similar to May 2025 Confirmed by Jerilynn Montenegro [phone]) on 02/09/2024 9:59:45 AM  Radiology CT ABDOMEN PELVIS WO CONTRAST Result Date: 02/09/2024 CLINICAL DATA:  Acute abdominal pain EXAM: CT ABDOMEN AND PELVIS WITHOUT CONTRAST TECHNIQUE: Multidetector CT imaging of the abdomen and pelvis was performed following the standard protocol without IV contrast. RADIATION DOSE REDUCTION: This exam was performed according to the departmental dose-optimization program which includes automated exposure control,  adjustment of the mA and/or kV according to patient size and/or use of iterative reconstruction technique. COMPARISON:  Jan 09, 2024 FINDINGS: Lower chest: No infiltrates or consolidations, no pleural effusions Hepatobiliary: No focal liver abnormality is seen. Status post cholecystectomy. No biliary dilatation. Pancreas: Pancreas normal size. No masses calcifications or inflammatory changes. Spleen: Spleen normal size.  No masses. Adrenals/Urinary Tract: Adrenal glands are normal size. Follow-up recommended. Kidneys are normal. No masses calcifications or hydronephrosis Stomach/Bowel: No small or large bowel obstruction or inflammatory changes. Diffuse diverticulosis of the sigmoid colon no definitive evidence of diverticulitis. Moderate amount of residual fecal material throughout the colon without obstruction or constipation. Vascular/Lymphatic: Aortic atherosclerosis. No enlarged abdominal or pelvic lymph nodes. No aneurysms, maximum AP diameter abdominal aorta is 2.6 cm. Reproductive: .  No masses. Bladder unremarkable. Other: Anterior abdominal wall unremarkable without evidence of umbilical or inguinal hernias Musculoskeletal: Visualized portion of the thoracolumbar spine and pelvic structures grossly unremarkable without evidence of fracture bony abnormalities or soft tissue masses. IMPRESSION: *No acute findings in the abdomen or pelvis. *Diffuse diverticulosis of the sigmoid colon without definitive  evidence of diverticulitis. *Moderate amount of residual fecal material throughout the colon without obstruction or constipation. *Status post cholecystectomy. *Aortic atherosclerosis. Electronically Signed   By: Fredrich Jefferson M.D.   On: 02/09/2024 13:03   DG Chest 2 View Result Date: 02/09/2024 CLINICAL DATA:  Chest pain. EXAM: CHEST - 2 VIEW COMPARISON:  01/09/2024. FINDINGS: There are probable atelectatic changes/scarring at the lung bases. Bilateral lung fields are otherwise clear. No acute consolidation or lung collapse. Bilateral costophrenic angles are clear. Normal cardio-mediastinal silhouette. No acute osseous abnormalities. The soft tissues are within normal limits. There are surgical clips in the right upper quadrant, typical of a previous cholecystectomy. IMPRESSION: No active cardiopulmonary disease. Electronically Signed   By: Beula Brunswick M.D.   On: 02/09/2024 10:45    Procedures Procedures    Medications Ordered in ED Medications  lactated ringers  bolus 1,000 mL (has no administration in time range)  heparin  injection 5,000 Units (has no administration in time range)  sodium chloride  flush (NS) 0.9 % injection 3 mL (has no administration in time range)  0.9 %  sodium chloride  infusion (has no administration in time range)  sodium chloride  flush (NS) 0.9 % injection 3 mL (has no administration in time range)  acetaminophen  (TYLENOL ) tablet 650 mg (has no administration in time range)    Or  acetaminophen  (TYLENOL ) suppository 650 mg (has no administration in time range)  oxyCODONE  (Oxy IR/ROXICODONE ) immediate release tablet 5 mg (has no administration in time range)  HYDROmorphone  (DILAUDID ) injection 0.5-1 mg (has no administration in time range)  traZODone  (DESYREL ) tablet 25 mg (has no administration in time range)  senna-docusate (Senokot-S) tablet 1 tablet (has no administration in time range)  bisacodyl (DULCOLAX) EC tablet 5 mg (has no administration in time range)   sodium phosphate  (FLEET) enema 1 enema (has no administration in time range)  ondansetron  (ZOFRAN ) tablet 4 mg (has no administration in time range)    Or  ondansetron  (ZOFRAN ) injection 4 mg (has no administration in time range)  ipratropium (ATROVENT) nebulizer solution 0.5 mg (has no administration in time range)  levalbuterol (XOPENEX) nebulizer solution 0.63 mg (has no administration in time range)  hydrALAZINE  (APRESOLINE ) injection 10 mg (has no administration in time range)  lactated ringers  bolus 1,000 mL (0 mLs Intravenous Stopped 02/09/24 1352)  ondansetron  (ZOFRAN ) injection 4 mg (4 mg Intravenous Given 02/09/24 1206)  ED Course/ Medical Decision Making/ A&P                                 Medical Decision Making Amount and/or Complexity of Data Reviewed Labs: ordered.    Details: Acute kidney injury Radiology: ordered and independent interpretation performed.    Details: No bowel obstruction ECG/medicine tests: ordered and independent interpretation performed.    Details: No ischemia  Risk Prescription drug management. Decision regarding hospitalization.   Patient's wife updated history after she arrived and he is been having abdominal pain as well as vomiting for several weeks.  Labs show an acute on chronic kidney injury.  No obstruction or diverticulitis on CT.  He was given Zofran  and now is doing better after he started vomiting in the emergency department.  He was given some IV fluids.  He will need admission for the AKI.  Unfortunately after multiple calls to the labs the urine is still pending.  Discussed with Dr. Eilene Grater.        Final Clinical Impression(s) / ED Diagnoses Final diagnoses:  Acute kidney injury superimposed on chronic kidney disease [facility])    Rx / DC Orders ED Discharge Orders     None         Jerilynn Montenegro, MD 02/09/24 1454

## 2024-02-09 NOTE — Progress Notes (Signed)
   02/09/24 2201  TOC Brief Assessment  Insurance and Status Reviewed  Patient has primary care physician Yes  Home environment has been reviewed From home c/wife  Prior level of function: Independent  Prior/Current Home Services No current home services  Social Drivers of Health Review SDOH reviewed no interventions necessary  Readmission risk has been reviewed Yes  Transition of care needs no transition of care needs at this time   Transition of Care Department [facility]) has reviewed patient and no TOC needs have been identified at this time. We will continue to monitor patient advancement through interdisciplinary progression rounds. If new patient transition needs arise, please place a TOC consult.

## 2024-02-09 NOTE — Assessment & Plan Note (Signed)
 Body mass index is 21.26 kg/m. - Consult nutrition, -Will add Megace once can tolerate p.o. -Dietary supplements

## 2024-02-09 NOTE — Assessment & Plan Note (Addendum)
-   Continue PPI (may switch to IV due to abdominal)

## 2024-02-09 NOTE — Assessment & Plan Note (Signed)
-   With history of dementia, continue Lexapro , trazodone , Aricept

## 2024-02-09 NOTE — Care Management Obs Status (Signed)
 MEDICARE OBSERVATION STATUS NOTIFICATION   Patient Details  Name: [name] MRN: [id number] Date of Birth: 1946-01-13   Medicare Observation Status Notification Given:  Yes    Geraldina Klinefelter, RN 02/09/2024, 8:54 PM

## 2024-02-09 NOTE — ED Notes (Signed)
 Patient transported to X-ray

## 2024-02-09 NOTE — ED Notes (Addendum)
 Nurse called lab for results on Urinalysis, results are in.

## 2024-02-10 ENCOUNTER — Observation Stay (HOSPITAL_COMMUNITY): Admitting: Anesthesiology

## 2024-02-10 ENCOUNTER — Encounter (HOSPITAL_COMMUNITY)
Admission: EM | Disposition: A | Discharge status: Home / Self Care | Payer: Self-pay | Source: Home / Self Care | Attending: Emergency Medicine

## 2024-02-10 DIAGNOSIS — R1013 Epigastric pain: Secondary | ICD-10-CM | POA: Diagnosis not present

## 2024-02-10 DIAGNOSIS — R1084 Generalized abdominal pain: Principal | ICD-10-CM

## 2024-02-10 DIAGNOSIS — N179 Acute kidney failure, unspecified: Secondary | ICD-10-CM | POA: Diagnosis not present

## 2024-02-10 DIAGNOSIS — K3189 Other diseases of stomach and duodenum: Secondary | ICD-10-CM | POA: Diagnosis not present

## 2024-02-10 DIAGNOSIS — K259 Gastric ulcer, unspecified as acute or chronic, without hemorrhage or perforation: Secondary | ICD-10-CM | POA: Diagnosis not present

## 2024-02-10 DIAGNOSIS — R1319 Other dysphagia: Secondary | ICD-10-CM

## 2024-02-10 DIAGNOSIS — K317 Polyp of stomach and duodenum: Secondary | ICD-10-CM

## 2024-02-10 DIAGNOSIS — R1310 Dysphagia, unspecified: Secondary | ICD-10-CM | POA: Diagnosis not present

## 2024-02-10 DIAGNOSIS — K2950 Unspecified chronic gastritis without bleeding: Secondary | ICD-10-CM | POA: Diagnosis not present

## 2024-02-10 DIAGNOSIS — I499 Cardiac arrhythmia, unspecified: Secondary | ICD-10-CM | POA: Diagnosis not present

## 2024-02-10 HISTORY — PX: MALONEY DILATION: SHX5535

## 2024-02-10 HISTORY — PX: ESOPHAGOGASTRODUODENOSCOPY: SHX5428

## 2024-02-10 LAB — COMPREHENSIVE METABOLIC PANEL WITH GFR
ALT: 17 U/L (ref 0–44)
AST: 15 U/L (ref 15–41)
Albumin: 2.5 g/dL — ABNORMAL LOW (ref 3.5–5.0)
Alkaline Phosphatase: 51 U/L (ref 38–126)
Anion gap: 3 — ABNORMAL LOW (ref 5–15)
BUN: 35 mg/dL — ABNORMAL HIGH (ref 8–23)
CO2: 26 mmol/L (ref 22–32)
Calcium: 7.7 mg/dL — ABNORMAL LOW (ref 8.9–10.3)
Chloride: 109 mmol/L (ref 98–111)
Creatinine, Ser: 2.32 mg/dL — ABNORMAL HIGH (ref 0.61–1.24)
GFR, Estimated: 28 mL/min — ABNORMAL LOW (ref >60)
Glucose, Bld: 90 mg/dL (ref 70–99)
Potassium: 4.2 mmol/L (ref 3.5–5.1)
Sodium: 138 mmol/L (ref 135–145)
Total Bilirubin: 0.4 mg/dL (ref 0.0–1.2)
Total Protein: 5.2 g/dL — ABNORMAL LOW (ref 6.5–8.1)

## 2024-02-10 LAB — CBC
HCT: 26.8 % — ABNORMAL LOW (ref 39.0–52.0)
Hemoglobin: 8 g/dL — ABNORMAL LOW (ref 13.0–17.0)
MCH: 24.3 pg — ABNORMAL LOW (ref 26.0–34.0)
MCHC: 29.9 g/dL — ABNORMAL LOW (ref 30.0–36.0)
MCV: 81.5 fL (ref 80.0–100.0)
Platelets: 264 10*3/uL (ref 150–400)
RBC: 3.29 MIL/uL — ABNORMAL LOW (ref 4.22–5.81)
RDW: 15.8 % — ABNORMAL HIGH (ref 11.5–15.5)
WBC: 8.1 10*3/uL (ref 4.0–10.5)
nRBC: 0 % (ref 0.0–0.2)

## 2024-02-10 LAB — APTT: aPTT: 27 s (ref 24–36)

## 2024-02-10 LAB — GLUCOSE, CAPILLARY
Glucose-Capillary: 85 mg/dL (ref 70–99)
Glucose-Capillary: 83 mg/dL (ref 70–99)
Glucose-Capillary: 108 mg/dL — ABNORMAL HIGH (ref 70–99)

## 2024-02-10 SURGERY — EGD (ESOPHAGOGASTRODUODENOSCOPY)
Anesthesia: General

## 2024-02-10 MED ORDER — SODIUM CHLORIDE 0.9 % IV SOLN: INTRAVENOUS | Status: DC

## 2024-02-10 MED ORDER — METHOCARBAMOL 500 MG PO TABS
500.0000 mg | ORAL_TABLET | Freq: Three times a day (TID) | ORAL | Status: DC
  Administered 2024-02-10 – 2024-02-11 (×4): 500 mg via ORAL
  Filled 2024-02-10 (×4): qty 1

## 2024-02-10 MED ORDER — DULOXETINE HCL 20 MG PO CPEP
40.0000 mg | ORAL_CAPSULE | Freq: Every day | ORAL | Status: DC
  Administered 2024-02-10 – 2024-02-11 (×2): 40 mg via ORAL
  Filled 2024-02-10 (×2): qty 2

## 2024-02-10 MED ORDER — PROPOFOL 10 MG/ML IV BOLUS
INTRAVENOUS | Status: DC | PRN
  Administered 2024-02-10: 180 mg via INTRAVENOUS

## 2024-02-10 MED ORDER — PROPOFOL 10 MG/ML IV BOLUS
INTRAVENOUS | Status: AC
  Filled 2024-02-10: qty 20

## 2024-02-10 NOTE — Plan of Care (Signed)
  Problem: Pain Managment: Goal: General experience of comfort will improve and/or be controlled Outcome: Progressing   Problem: Safety: Goal: Ability to remain free from injury will improve Outcome: Progressing

## 2024-02-10 NOTE — Progress Notes (Signed)
 PROGRESS NOTE    Patient: [name]                            PCP: Yvonnie Heritage, NP                    DOB: [birth year]            DOA: 02/09/2024 MVH:[id number]             DOS: 02/10/2024, 10:57 AM   LOS: 0 days   Date of Service: The patient was seen and examined on 02/10/2024  Subjective:   The patient was seen and examined this morning.  Continue to have vague abdominal chest back pain-no vomiting or diarrhea overnight Hemodynamically stable. No issues overnight . NPO -in anticipation of EGD this morning   Brief Narrative:   [name] is a 78 year old male with extensive history of mild/early dementia, anxiety, depression, CKD 3B, GERD, gout, hyperlipidemia, hypertension, RA, degenerative disc disease, PUD,  chronic pain-especially abdominal ...  Presented to ED with intractable nausea vomiting and abdominal pain.  Reporting 2 weeks of lower chest, abdominal pain, epigastric pain.  Associated with mild shortness of breath, and progressive nausea vomiting.  He reports his overall constant pain with certain position gets worse.  Pain is mainly periumbilical/upper abdominal area.  With no radiation.  Shortness of breath is constant limited to ability mobility and exertion.  Denies of having fever, chills, cough, congestion, denies having lower extremity edema, denies of having any diarrhea.  Complains of nausea but no extensive vomiting.   ED evaluation: Blood pressure 135/75, pulse 62, temperature 97.6 F (36.4 C), temperature source Oral, resp. rate 18, height 5\' 9"  (1.753 m), weight 65.3 kg, SpO2 100%.  LABs: CBC WBC 11.7, hemoglobin 9.8, CO2 21 BUN 47 creatinine 3.08, GFR 20 calcium 7.9, glucose, troponin 10, 8.  Lactic acid 1.1,  UA pending:  CT abdomen pelvis:  *No acute findings in the abdomen or pelvis. *Diffuse diverticulosis of the sigmoid colon without definitive evidence of diverticulitis. *Moderate amount of residual fecal material throughout the  colon without obstruction or constipation. *Status post cholecystectomy. *Aortic atherosclerosis.   Requested the patient to be admitted for abdominal pain, nausea likely vomiting, dehydration, and worsening kidney function.    Assessment & Plan:   Principal Problem:     ABDOMINAL PAIN-EPIGASTRIC     Nausea with vomiting Acute renal failure superimposed on stage 3b chronic kidney disease (HCC)  Active Problems:   Dehydration   Rheumatoid arthritis (HCC)   Gastric ulcer   Diverticulosis of colon   GERD   Essential hypertension   Hyperlipidemia   Anxiety and depression   Amnestic MCI (mild cognitive impairment with memory loss)   Failure to thrive in adult   Esophageal dysphagia    ABDOMINAL PAIN -GENERALIZED - With chronic history of NSAID use due to rheumatoid arthritis -History of peptic ulcer disease  -Continue to complain of epigastric abdominal pain associated with nausea vomiting although improved overnight  - NPO.  - 02/10/2024 EGD: S/p EGD; normal esophagus, dilated gastric polyps, removed, gastric erythema, s/p biopsy normal duodenum      -CT of abdomen reviewed, no acute pathology, chronic diverticulosis no signs of diverticulitis, - Likely exacerbated by nausea vomiting - As needed analgesics, antiemetics (IV Reglan ) - Continue scheduled Maalox, Protonix   -Appreciate GI input   Nausea with vomiting - Intractable nausea vomiting -vital gastritis versus constipation -Continue  IV fluid hydration, antiemetics, -Bowel regimen  GERD - Continue PPI (may switch to IV due to abdominal)   * Acute renal failure superimposed on stage 3b chronic kidney disease (HCC) - 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 hydration  Rheumatoid arthritis (HCC) - Continue home prednisone , holding fluting Mobic-Ultram , due to elevated BUN/creatinine  Failure to thrive in adult Body mass index is 21.26 kg/m. - Consult nutrition, -Will add Megace once can tolerate p.o. -Dietary supplements   Amnestic MCI (mild cognitive impairment with memory loss) - With history of dementia, continue Lexapro , trazodone , Aricept   Anxiety and depression-  resume Lexapro , trazodone ,  Hyperlipidemia- continue statin  Essential hypertension - BP closely, resuming home medications of Norvasc , propranolol  Will hold losartan , -Utilize as needed hydralazine    Diverticulosis of colon History of diverticulosis, relevant on CT scan no evidence of diverticulitis -Will monitor closely  Gastric ulcer - History of gastric ulcer,-presenting with abdominal pain - Continue PPI, avoiding NSAIDs     Nutritional status:  The patient's BMI is: Body mass index is 21.26 kg/m. I agree with the assessment and plan as outlined ------------------------------------------------------------------------------------------------------------------------------------------------  DVT prophylaxis:  TED hose Start: 02/09/24 1451 SCDs Start: 02/09/24 1451   Code Status:   Code Status: Full Code  Family Communication: Wife updated -Advance care planning has been discussed.   Admission status:   Status is: Observation The patient remains OBS appropriate and will d/c before 2 midnights.   Disposition: From  - home             Planning for discharge in 1-2 days: to   Procedures:   No admission procedures for hospital encounter.   Antimicrobials:  Anti-infectives (From admission, onward)    None         Medication:   alum & mag hydroxide-simeth  15 mL Oral Q8H   amLODipine   5 mg Oral Daily   escitalopram   20 mg Oral QHS   feeding supplement  1 Container Oral TID BM   levothyroxine   12.5 mcg Oral QAC breakfast   melatonin  9 mg Oral QHS   methocarbamol  500 mg Oral TID   metoCLOPramide  (REGLAN ) injection  10 mg Intravenous Q12H   pantoprazole  (PROTONIX ) IV  40 mg Intravenous Q12H   predniSONE   10 mg Oral Q breakfast   propranolol  ER  60 mg Oral Daily   sennosides  10 mL Oral BID   sodium chloride  flush  3 mL Intravenous Q12H   sodium chloride  flush  3 mL Intravenous Q12H    acetaminophen  **OR** acetaminophen , bisacodyl , hydrALAZINE , HYDROmorphone  (DILAUDID ) injection, ipratropium, levalbuterol , ondansetron  **OR** ondansetron  (ZOFRAN ) IV, oxyCODONE , sodium phosphate , traZODone    Objective:   Vitals:   02/10/24 0940 02/10/24 1000 02/10/24 1018 02/10/24 1030  BP: (!) 127/54 (!) 172/80 101/64 133/65  Pulse: 67 64 (!) 59 (!) 59  Resp:   12 11  Temp:   97.7 F (36.5 C)   TempSrc:      SpO2:  95% 99% 99%  Weight:      Height:  Intake/Output Summary (Last 24 hours) at 02/10/2024 1058 Last data filed at 02/10/2024 1013 Gross per 24 hour  Intake 1584.01 ml  Output 1000 ml  Net 584.01 ml   Filed Weights   02/09/24 0953  Weight: 65.3 kg     Physical examination:   Constitution:  Alert, cooperative, no distress,  Appears calm and comfortable  Psychiatric:   Normal and stable mood and affect, cognition intact,   HEENT:        Normocephalic, PERRL, otherwise with in Normal limits  Chest:         Chest symmetric Cardio vascular:  S1/S2, RRR, No murmure, No Rubs or Gallops  pulmonary: Clear to auscultation bilaterally, respirations unlabored, negative wheezes / crackles Abdomen: Soft, non-tender, non-distended, bowel sounds,no masses, no organomegaly Muscular skeletal: Limited exam - in bed, able to move all 4 extremities,   Neuro: CNII-XII intact. , normal  motor and sensation, reflexes intact  Extremities: No pitting edema lower extremities, +2 pulses  Skin: Dry, warm to touch, negative for any Rashes, No open wounds Wounds: per nursing documentation   ------------------------------------------------------------------------------------------------------------------------------------------    LABs:     Latest Ref Rng & Units 02/10/2024    4:10 AM 02/09/2024   10:08 AM 01/13/2024    3:21 AM  CBC  WBC 4.0 - 10.5 K/uL 8.1  11.7  8.8   Hemoglobin 13.0 - 17.0 g/dL 8.0  9.8  9.3   Hematocrit 39.0 - 52.0 % 26.8  31.0  29.7   Platelets 150 - 400 K/uL 264  361  370       Latest Ref Rng & Units 02/10/2024    4:10 AM 02/09/2024   10:08 AM 01/13/2024    3:21 AM  CMP  Glucose 70 - 99 mg/dL 90  045  73   BUN 8 - 23 mg/dL 35  47  20   Creatinine 0.61 - 1.24 mg/dL 4.09  8.11  9.14   Sodium 135 - 145 mmol/L 138  137  141   Potassium 3.5 - 5.1 mmol/L 4.2  4.0  3.7   Chloride 98 - 111 mmol/L 109  106  108   CO2 22 - 32 mmol/L 26  21  24    Calcium 8.9 - 10.3 mg/dL 7.7  7.9  8.3   Total Protein 6.5 - 8.1 g/dL 5.2  6.2    Total Bilirubin 0.0 - 1.2 mg/dL 0.4  0.5    Alkaline Phos 38 - 126 U/L 51  61    AST 15 - 41 U/L 15  21    ALT 0 - 44 U/L 17  18         Micro Results No results found for this or any previous visit (from the past 240 hours).  Radiology Reports CT ABDOMEN PELVIS WO CONTRAST Result Date: 02/09/2024 CLINICAL DATA:  Acute abdominal pain EXAM: CT ABDOMEN AND PELVIS WITHOUT CONTRAST TECHNIQUE: Multidetector CT imaging of the abdomen and pelvis was performed following the standard protocol without IV contrast. RADIATION DOSE REDUCTION: This exam was performed according to the departmental dose-optimization program which includes automated exposure control, adjustment of the mA and/or kV according to patient size and/or use of iterative reconstruction technique. COMPARISON:  Jan 09, 2024 FINDINGS: Lower chest: No infiltrates or  consolidations, no pleural effusions Hepatobiliary: No focal liver abnormality is seen. Status post cholecystectomy. No biliary dilatation. Pancreas: Pancreas normal size. No masses calcifications or inflammatory changes. Spleen: Spleen normal size.  No masses. Adrenals/Urinary Tract: Adrenal glands  are normal size. Follow-up recommended. Kidneys are normal. No masses calcifications or hydronephrosis Stomach/Bowel: No small or large bowel obstruction or inflammatory changes. Diffuse diverticulosis of the sigmoid colon no definitive evidence of diverticulitis. Moderate amount of residual fecal material throughout the colon without obstruction or constipation. Vascular/Lymphatic: Aortic atherosclerosis. No enlarged abdominal or pelvic lymph nodes. No aneurysms, maximum AP diameter abdominal aorta is 2.6 cm. Reproductive: .  No masses. Bladder unremarkable. Other: Anterior abdominal wall unremarkable without evidence of umbilical or inguinal hernias Musculoskeletal: Visualized portion of the thoracolumbar spine and pelvic structures grossly unremarkable without evidence of fracture bony abnormalities or soft tissue masses. IMPRESSION: *No acute findings in the abdomen or pelvis. *Diffuse diverticulosis of the sigmoid colon without definitive evidence of diverticulitis. *Moderate amount of residual fecal material throughout the colon without obstruction or constipation. *Status post cholecystectomy. *Aortic atherosclerosis. Electronically Signed   By: Fredrich Jefferson M.D.   On: 02/09/2024 13:03    SIGNED: Bobbetta Burnet, MD, FHM. FAAFP.  - [facility] Time spent - 55 min.  In seeing, evaluating and examining the patient. Reviewing medical records, labs, drawn plan of care. [facility],  Pager (please use [email or web address] to page/ text) Please use Epic Secure Chat for non-urgent communication (7AM-7PM)  If 7PM-7AM, please contact night-coverage [email or web address], 02/10/2024, 10:58 AM

## 2024-02-10 NOTE — Transfer of Care (Signed)
 Immediate Anesthesia Transfer of Care Note  Patient: [name]  Procedure(s) Performed: EGD (ESOPHAGOGASTRODUODENOSCOPY)  Patient Location: PACU  Anesthesia Type:General  Level of Consciousness: drowsy  Airway & Oxygen Therapy: Patient Spontanous Breathing  Post-op Assessment: Report given to RN and Post -op Vital signs reviewed and stable  Post vital signs: Reviewed and stable  Last Vitals:  Vitals Value Taken Time  BP 101/64 02/10/24 1020  Temp 98   Pulse 60 02/10/24 1021  Resp 11 02/10/24 1021  SpO2 98 % 02/10/24 1021  Vitals shown include unfiled device data.  Last Pain:  Vitals:   02/10/24 0824  TempSrc:   PainSc: 3       Patients Stated Pain Goal: 1 (02/10/24 0824)  Complications: No notable events documented.

## 2024-02-10 NOTE — Interval H&P Note (Signed)
 History and Physical Interval Note:  02/10/2024 9:59 AM  [name]  has presented today for surgery, with the diagnosis of intractable nausea/vomiting, substernal chest pain, epigastric pain.  The various methods of treatment have been discussed with the patient and family. After consideration of risks, benefits and other options for treatment, the patient has consented to  Procedure(s): EGD (ESOPHAGOGASTRODUODENOSCOPY) (N/A) as a surgical intervention.  The patient's history has been reviewed, patient examined, no change in status, stable for surgery.  I have reviewed the patient's chart and labs.  Questions were answered to the patient's satisfaction.     [name]  Seen in short stay.  No change.  Patient endorses esophageal dysphagia.  EGD for diagnostic purposes.  Potential for esophageal dilation as feasible/appropriate per plan. The risks, benefits, limitations, alternatives and imponderables have been reviewed with the patient. Potential for esophageal dilation, biopsy, etc. have also been reviewed.  Questions have been answered. All parties agreeable.

## 2024-02-10 NOTE — Progress Notes (Addendum)
 PT Cancellation Note  Patient Details Name: [name] MRN: [id number] DOB: [birth year]   Cancelled Treatment:    Reason Eval/Treat Not Completed: Patient at procedure or test/unavailable Pt at procedure, endoscopy, when PT eval was attempted around 10:00am. Will complete at later time/date as appropriate.  12:03 PM, 02/10/24 Marysue Sola, PT, DPT  with [facility]

## 2024-02-10 NOTE — Anesthesia Postprocedure Evaluation (Signed)
 Anesthesia Post Note  Patient: [name]  Procedure(s) Performed: EGD (ESOPHAGOGASTRODUODENOSCOPY)  Patient location during evaluation: PACU Anesthesia Type: General Level of consciousness: awake and alert Pain management: pain level controlled Vital Signs Assessment: post-procedure vital signs reviewed and stable Respiratory status: spontaneous breathing, nonlabored ventilation, respiratory function stable and patient connected to nasal cannula oxygen Cardiovascular status: blood pressure returned to baseline and stable Postop Assessment: no apparent nausea or vomiting Anesthetic complications: no   No notable events documented.   Last Vitals:  Vitals:   02/10/24 0940 02/10/24 1000  BP: (!) 127/54 (!) 172/80  Pulse: 67 64  Resp:    Temp:    SpO2:  95%    Last Pain:  Vitals:   02/10/24 0824  TempSrc:   PainSc: 3                  Coretha Dew

## 2024-02-10 NOTE — Plan of Care (Signed)

## 2024-02-10 NOTE — Anesthesia Preprocedure Evaluation (Signed)
 Anesthesia Evaluation  Patient identified by MRN, date of birth, ID band Patient awake    Reviewed: Allergy & Precautions, H&P , NPO status , Patient's Chart, lab work & pertinent test results, reviewed documented beta blocker date and time   Airway Mallampati: II  TM Distance: >3 FB Neck ROM: full    Dental no notable dental hx.    Pulmonary neg pulmonary ROS, former smoker   Pulmonary exam normal breath sounds clear to auscultation       Cardiovascular Exercise Tolerance: Good hypertension, negative cardio ROS + dysrhythmias  Rhythm:regular Rate:Normal     Neuro/Psych  PSYCHIATRIC DISORDERS Anxiety Depression    negative neurological ROS  negative psych ROS   GI/Hepatic negative GI ROS, Neg liver ROS, PUD,GERD  ,,  Endo/Other  negative endocrine ROS    Renal/GU Renal diseasenegative Renal ROS  negative genitourinary   Musculoskeletal   Abdominal   Peds  Hematology negative hematology ROS (+)   Anesthesia Other Findings   Reproductive/Obstetrics negative OB ROS                             Anesthesia Physical Anesthesia Plan  ASA: 3 and emergent  Anesthesia Plan: General   Post-op Pain Management:    Induction:   PONV Risk Score and Plan: Propofol  infusion  Airway Management Planned:   Additional Equipment:   Intra-op Plan:   Post-operative Plan:   Informed Consent: I have reviewed the patients History and Physical, chart, labs and discussed the procedure including the risks, benefits and alternatives for the proposed anesthesia with the patient or authorized representative who has indicated his/her understanding and acceptance.     Dental Advisory Given  Plan Discussed with: CRNA  Anesthesia Plan Comments:        Anesthesia Quick Evaluation

## 2024-02-10 NOTE — Op Note (Signed)
 [facility] Patient Name: [name] Procedure Date: 02/10/2024 9:55 AM MRN: [id number] Date of Birth: 1945-11-28 Attending MD: Gemma Kelp , MD, [id number] CSN: [id number] Age: 78 Admit Type: Inpatient Procedure:                Upper GI endoscopy Indications:              Nausea with vomiting; dysphagia Providers:                Gemma Kelp, MD, [place] Bond, RN, [place] Butter, Technician Referring MD:              Medicines:                Propofol  per Anesthesia Complications:            No immediate complications. Estimated Blood Loss:     Estimated blood loss was minimal. Procedure:                Pre-Anesthesia Assessment:                           - Prior to the procedure, a History and Physical                            was performed, and patient medications and                            allergies were reviewed. The patient's tolerance of                            previous anesthesia was also reviewed. The risks                            and benefits of the procedure and the sedation                            options and risks were discussed with the patient.                            All questions were answered, and informed consent                            was obtained. Prior Anticoagulants: The patient                            last took heparin  1 day prior to the procedure. ASA                            Grade Assessment: III - A patient with severe                            systemic disease. After reviewing the risks and  benefits, the patient was deemed in satisfactory                            condition to undergo the procedure.                           After obtaining informed consent, the endoscope was                            passed under direct vision. Throughout the                            procedure, the patient's blood pressure, pulse, and                             oxygen saturations were monitored continuously. The                            [id number]-[id number] ([id number]) scope was introduced through the                            mouth, and advanced to the second part of duodenum. Scope In: 10:07:07 AM Scope Out: 10:14:20 AM Total Procedure Duration: 0 hours 7 minutes 13 seconds  Findings:      The examined esophagus was normal. Scattered 1 to 3 mm benign-appearing       polyps. Patchy erythema of the antrum with some deformity but no ulcer       or infiltrating process was seen. Pylorus patent.      The duodenal bulb and second portion of the duodenum were normal. The       scope was withdrawn. Dilation was performed with a Maloney dilator with       mild resistance at 56 Fr. The dilation site was examined following       endoscope reinsertion and showed mild mucosal disruption. Estimated       blood loss was minimal. Finally, one of the gastric polyps was cold       biopsied/ removed. Gastric mucosa was biopsied as well. Impression:               - Normal esophagus. Dilated. Gastric polyps?"1                            removed.                           - Gastric erythema and antral deformity?"status post                            gastric mucosal biopsy                           - Normal duodenal bulb and second portion of the                            duodenum. Moderate Sedation:      Moderate (conscious) sedation was personally administered by an  anesthesia professional. The following parameters were monitored: oxygen       saturation, heart rate, blood pressure, respiratory rate, EKG, adequacy       of pulmonary ventilation, and response to care. Recommendation:           - Return patient to hospital ward for observation.                           - Resume regular diet.                           - Avoid all systemic NSAIDs. Full dose once daily                            PPI therapy. I will follow-up on pathology. Procedure Code(s):         --- Professional ---                           [phone], Esophagogastroduodenoscopy, flexible,                            transoral; diagnostic, including collection of                            specimen(s) by brushing or washing, when performed                            (separate procedure)                           43450, Dilation of esophagus, by unguided sound or                            bougie, single or multiple passes Diagnosis Code(s):        --- Professional ---                           R11.2, Nausea with vomiting, unspecified CPT copyright 2022 American Medical Association. All rights reserved. The codes documented in this report are preliminary and upon coder review may  be revised to meet current compliance requirements. Windsor Hatcher. Belem Hintze, MD Gemma Kelp, MD 02/10/2024 10:30:47 AM This report has been signed electronically. Number of Addenda: 0

## 2024-02-11 DIAGNOSIS — K317 Polyp of stomach and duodenum: Secondary | ICD-10-CM | POA: Diagnosis not present

## 2024-02-11 DIAGNOSIS — R1084 Generalized abdominal pain: Secondary | ICD-10-CM | POA: Diagnosis not present

## 2024-02-11 DIAGNOSIS — K3189 Other diseases of stomach and duodenum: Secondary | ICD-10-CM | POA: Diagnosis not present

## 2024-02-11 DIAGNOSIS — N179 Acute kidney failure, unspecified: Secondary | ICD-10-CM | POA: Diagnosis not present

## 2024-02-11 DIAGNOSIS — R1013 Epigastric pain: Secondary | ICD-10-CM | POA: Diagnosis not present

## 2024-02-11 DIAGNOSIS — K2950 Unspecified chronic gastritis without bleeding: Secondary | ICD-10-CM | POA: Diagnosis not present

## 2024-02-11 LAB — COMPREHENSIVE METABOLIC PANEL WITH GFR
ALT: 13 U/L (ref 0–44)
AST: 17 U/L (ref 15–41)
Albumin: 2.9 g/dL — ABNORMAL LOW (ref 3.5–5.0)
Alkaline Phosphatase: 63 U/L (ref 38–126)
Anion gap: 7 (ref 5–15)
BUN: 26 mg/dL — ABNORMAL HIGH (ref 8–23)
CO2: 25 mmol/L (ref 22–32)
Calcium: 8.2 mg/dL — ABNORMAL LOW (ref 8.9–10.3)
Chloride: 108 mmol/L (ref 98–111)
Creatinine, Ser: 1.9 mg/dL — ABNORMAL HIGH (ref 0.61–1.24)
GFR, Estimated: 35 mL/min — ABNORMAL LOW (ref >60)
Glucose, Bld: 76 mg/dL (ref 70–99)
Potassium: 4.1 mmol/L (ref 3.5–5.1)
Sodium: 140 mmol/L (ref 135–145)
Total Bilirubin: 0.6 mg/dL (ref 0.0–1.2)
Total Protein: 5.9 g/dL — ABNORMAL LOW (ref 6.5–8.1)

## 2024-02-11 LAB — CBC
HCT: 32 % — ABNORMAL LOW (ref 39.0–52.0)
Hemoglobin: 9.5 g/dL — ABNORMAL LOW (ref 13.0–17.0)
MCH: 24.6 pg — ABNORMAL LOW (ref 26.0–34.0)
MCHC: 29.7 g/dL — ABNORMAL LOW (ref 30.0–36.0)
MCV: 82.9 fL (ref 80.0–100.0)
Platelets: 309 10*3/uL (ref 150–400)
RBC: 3.86 MIL/uL — ABNORMAL LOW (ref 4.22–5.81)
RDW: 16 % — ABNORMAL HIGH (ref 11.5–15.5)
WBC: 10.8 10*3/uL — ABNORMAL HIGH (ref 4.0–10.5)
nRBC: 0 % (ref 0.0–0.2)

## 2024-02-11 LAB — GLUCOSE, CAPILLARY: Glucose-Capillary: 100 mg/dL — ABNORMAL HIGH (ref 70–99)

## 2024-02-11 MED ORDER — PANTOPRAZOLE SODIUM 40 MG PO TBEC
40.0000 mg | DELAYED_RELEASE_TABLET | Freq: Every day | ORAL | Status: DC
  Filled 2024-02-11: qty 1

## 2024-02-11 MED ORDER — DULOXETINE HCL 40 MG PO CPEP: 40.0000 mg | ORAL_CAPSULE | Freq: Every day | ORAL | 0 refills | Status: AC

## 2024-02-11 MED ORDER — METHOCARBAMOL 500 MG PO TABS: 500.0000 mg | ORAL_TABLET | Freq: Three times a day (TID) | ORAL | 1 refills | Status: AC

## 2024-02-11 MED ORDER — MAALOX MULTI SYMPTOM MAX ST 400-400-40 MG/5ML PO SUSP: 10.0000 mL | Freq: Three times a day (TID) | ORAL | 0 refills | Status: AC

## 2024-02-11 MED ORDER — METOCLOPRAMIDE HCL 5 MG PO TABS: 5.0000 mg | ORAL_TABLET | Freq: Three times a day (TID) | ORAL | 1 refills | Status: AC

## 2024-02-11 MED ORDER — PANTOPRAZOLE SODIUM 40 MG PO TBEC: 40.0000 mg | DELAYED_RELEASE_TABLET | Freq: Two times a day (BID) | ORAL | 1 refills | Status: AC

## 2024-02-11 NOTE — Progress Notes (Signed)
 Patient states abdominal pain improved today dysphagia improved.  Tolerating clear liquids.  Patient's only concern this morning is headache. Ambulating.  Seen with nursing staff.   Vital signs in last 24 hours: Temp:  [97.5 F (36.4 C)-99 F (37.2 C)] 99 F (37.2 C) (06/08 0457) Pulse Rate:  [59-100] 100 (06/08 0457) Resp:  [11-19] 19 (06/08 0457) BP: (101-172)/(54-80) 124/71 (06/08 0457) SpO2:  [91 %-99 %] 91 % (06/08 0457) Weight:  [63.7 kg] 63.7 kg (06/08 0500) Last BM Date : 02/10/24 General:   Alert,  pleasant and cooperative in NAD Abdomen: Nondistended.  Positive bowel sounds soft and nontender.   Extremities:  Without clubbing or edema.    Intake/Output from previous day: 06/07 0701 - 06/08 0700 In: 423 [P.O.:240; I.V.:183] Out: 400 [Urine:400] Intake/Output this shift: No intake/output data recorded.  Lab Results: Recent Labs    02/09/24 1008 02/10/24 0410 02/11/24 0225  WBC 11.7* 8.1 10.8*  HGB 9.8* 8.0* 9.5*  HCT 31.0* 26.8* 32.0*  PLT 361 264 309   BMET Recent Labs    02/09/24 1008 02/10/24 0410 02/11/24 0225  NA 137 138 140  K 4.0 4.2 4.1  CL 106 109 108  CO2 21* 26 25  GLUCOSE 115* 90 76  BUN 47* 35* 26*  CREATININE 3.08* 2.32* 1.90*  CALCIUM 7.9* 7.7* 8.2*   LFT Recent Labs    02/11/24 0225  PROT 5.9*  ALBUMIN  2.9*  AST 17  ALT 13  ALKPHOS 63  BILITOT 0.6   PT/INR No results for input(s): "LABPROT", "INR" in the last 72 hours. Hepatitis Panel No results for input(s): "HEPBSAG", "HCVAB", "HEPAIGM", "HEPBIGM" in the last 72 hours. C-Diff No results for input(s): "CDIFFTOX" in the last 72 hours.  Impression: Pleasant 78 year old gentleman admitted with abdominal pain in the setting of NSAIDs/corticosteroid use.  EGD demonstrated a gastric polyp (removed) and deformity of the antrum with erythema.  No ulcer.  Biopsies pending  Normal esophagus  -  dilated.  Clinically, doing better today.  Recommendations:  -Heart healthy  diet.  - Continue twice daily PPI until reassessed as an outpatient  - I will follow-up on gastric biopsies  - Avoid all NSAIDs;  Corticosteroids okay if clinically indicated.  - Colonoscopy at a later date as an outpatient

## 2024-02-11 NOTE — Discharge Summary (Signed)
 Physician Discharge Summary   Patient: [name] MRN: [id number] DOB: [birth year]  Admit date:     02/09/2024  Discharge date: 02/11/24  Discharge Physician: Bobbetta Burnet   PCP: Yvonnie Heritage, NP   Recommendations at discharge:    Follow-up with PCP and gastroenterologist in 1-2 weeks We recommend: Taking Reglan  3 times a day 30 to 40 minutes before meals along with Maalox We recommend dietary supplements such as Ensure or boost-small portion meals 4-5 times a day We recommend taking his pain medication judiciously-as it may be contributing to his nausea vomiting We have substituted his Prilosec with Protonix , citalopram to duloxetine (better medicine for arthritis and pain) We also recommend and prescribe trial muscle relaxants Robaxin for better pain control  Discharge Diagnoses: Principal Problem:   ABDOMINAL PAIN -GENERALIZED Active Problems:   Nausea with vomiting   Acute renal failure superimposed on stage 3b chronic kidney disease (HCC)   Dehydration   Rheumatoid arthritis (HCC)   Gastric ulcer   Diverticulosis of colon   ABDOMINAL PAIN-EPIGASTRIC   GERD   Essential hypertension   Hyperlipidemia   Anxiety and depression   Amnestic MCI (mild cognitive impairment with memory loss)   Failure to thrive in adult   Esophageal dysphagia  Resolved Problems:   * No resolved hospital problems. *  Hospital Course: [name] is a 78 year old male with extensive history of mild/early dementia, anxiety, depression, CKD 3B, GERD, gout, hyperlipidemia, hypertension, RA, degenerative disc disease, PUD,  chronic pain-especially abdominal ...  Presented to ED with intractable nausea vomiting and abdominal pain.  Reporting 2 weeks of lower chest, abdominal pain, epigastric pain.  Associated with mild shortness of breath, and progressive nausea vomiting.  He reports his overall constant pain with certain position gets worse.  Pain is mainly periumbilical/upper abdominal area.   With no radiation.  Shortness of breath is constant limited to ability mobility and exertion.  Denies of having fever, chills, cough, congestion, denies having lower extremity edema, denies of having any diarrhea.  Complains of nausea but no extensive vomiting.   ED evaluation: Blood pressure 135/75, pulse 62, temperature 97.6 F (36.4 C), temperature source Oral, resp. rate 18, height 5\' 9"  (1.753 m), weight 65.3 kg, SpO2 100%.  LABs: CBC WBC 11.7, hemoglobin 9.8, CO2 21 BUN 47 creatinine 3.08, GFR 20 calcium 7.9, glucose, troponin 10, 8.  Lactic acid 1.1,  UA pending:  CT abdomen pelvis:  *No acute findings in the abdomen or pelvis. *Diffuse diverticulosis of the sigmoid colon without definitive evidence of diverticulitis. *Moderate amount of residual fecal material throughout the colon without obstruction or constipation. *Status post cholecystectomy. *Aortic atherosclerosis.   Requested the patient to be admitted for abdominal pain, nausea likely vomiting, dehydration, and worsening kidney function.   * ABDOMINAL PAIN -GENERALIZED - With chronic history of NSAID use due to rheumatoid arthritis -History of peptic ulcer disease - Nausea vomiting, intolerable to large meal, s/p another emesis postmeal yesterday evening, recommending small multiple meals a day  - Writer p.o. intake, taken oral Reglan , and Maalox  - 02/10/2024 EGD: S/p EGD; normal esophagus, dilated gastric polyps, removed, gastric erythema, s/p biopsy normal duodenum   - Likely exacerbated by nausea vomiting - As needed analgesics, antiemetics (IV Reglan )-prescribed Reglan  5 mg p.o. 3 times daily - Continue scheduled Maalox -to be taken 3 times a day -  Protonix  to be taken twice a day -CT of abdomen reviewed, no acute pathology, chronic diverticulosis no signs of diverticulitis,  Dehydration - S/p 2 L of IV fluid in ED, continue maintenance fluid -with Nausea vomiting, -As needed antiemetics, including  Reglan --prescription for Reglan  5 mg p.o. 3 times daily was given (Continue as needed Zofran  - will encourage p.o. hydration  Acute renal failure superimposed on stage 3b chronic kidney disease (HCC) - Acute on chronic kidney disease stage IIb - BUN/creatinine back to baseline - Encouraging avoiding nephrotoxins, including NSAIDs, p.o. hydration  Nausea with vomiting - Intractable nausea vomiting -vital gastritis versus constipation -Likely related to medication, narcotics -Prescription for Reglan  Zofran  and Maalox was given -Recommend multiple small meals a day with dietary supplements -Bowel regimen  Rheumatoid arthritis (HCC) - Continue home prednisone , holding fluting Mobic-due to elevated BUN/creatinine - Ultram  and Mobic's was discontinued, patient to continue steroids  Failure to thrive in adult Body mass index is 21.26 kg/m. - Continue dietary supplements  Amnestic MCI (mild cognitive impairment with memory loss) - With history of dementia, continue Lexapro , trazodone , Aricept  = Lexapro  substitute to duloxetine for better symptomatic management including pain  Anxiety and depression - Home medications reviewed will resume  trazodone , Discontinue Lexapro , substituted with duloxetine  Hyperlipidemia - continue statin  Essential hypertension - BP closely, resuming home medications of  propranolol  -Discontinue, Norvasc  and  losartan ,   GERD - Continue PPI (may switch to IV due to abdominal)-Pepcid switched to Protonix   Diverticulosis of colon History of diverticulosis, relevant on CT scan no evidence of diverticulitis -Will monitor closely  Gastric ulcer - History of gastric ulcer,-presenting with abdominal pain - Continue PPI, avoiding NSAIDs         Consultants: Gastroenterologist Procedures performed: EGD Disposition: Home Diet recommendation:  Discharge Diet Orders (From admission, onward)     Start     Ordered   02/11/24 0000  Diet full liquid         02/11/24 1023           Full liquid diet DISCHARGE MEDICATION: Allergies as of 02/11/2024   No Known Allergies      Medication List     STOP taking these medications    escitalopram  20 MG tablet Commonly known as: LEXAPRO    losartan  100 MG tablet Commonly known as: COZAAR    meloxicam 15 MG tablet Commonly known as: MOBIC   metroNIDAZOLE  500 MG tablet Commonly known as: Flagyl    omeprazole 40 MG capsule Commonly known as: PRILOSEC   traMADol  50 MG tablet Commonly known as: ULTRAM        TAKE these medications    acetaminophen  500 MG tablet Commonly known as: TYLENOL  Take 1 tablet (500 mg total) by mouth every 6 (six) hours as needed for mild pain or headache.   alendronate 70 MG tablet Commonly known as: FOSAMAX Take 70 mg by mouth once a week.   amLODipine  5 MG tablet Commonly known as: NORVASC  Take 1 tablet (5 mg total) by mouth daily.   aspirin  EC 81 MG tablet Take 81 mg by mouth daily. Swallow whole.   donepezil  5 MG tablet Commonly known as: ARICEPT  TAKE 1 TABLET BY MOUTH EVERYDAY AT BEDTIME   DULoxetine HCl 40 MG Cpep Take 1 capsule (40 mg total) by mouth daily. Start taking on: February 12, 2024   HYDROcodone -acetaminophen  5-325 MG tablet Commonly known as: NORCO/VICODIN Take 1 tablet by mouth 2 (two) times daily as needed.   levothyroxine  25 MCG tablet Commonly known as: SYNTHROID  Take 12.5 mcg by mouth daily before breakfast.   Maalox Multi Symptom Max St 400-400-40 MG/5ML suspension  Generic drug: alum & mag hydroxide-simeth Take 10 mLs by mouth every 8 (eight) hours.   Melatonin 10 MG Tabs Take 10 mg by mouth at bedtime.   methocarbamol 500 MG tablet Commonly known as: ROBAXIN Take 1 tablet (500 mg total) by mouth 3 (three) times daily.   metoCLOPramide  5 MG tablet Commonly known as: Reglan  Take 1 tablet (5 mg total) by mouth 3 (three) times daily before meals.   ondansetron  4 MG disintegrating tablet Commonly known as:  ZOFRAN -ODT Take 1 tablet (4 mg total) by mouth every 8 (eight) hours as needed for nausea or vomiting.   pantoprazole  40 MG tablet Commonly known as: PROTONIX  Take 1 tablet (40 mg total) by mouth 2 (two) times daily.   pravastatin  40 MG tablet Commonly known as: PRAVACHOL  Take 40 mg by mouth daily.   predniSONE  10 MG tablet Commonly known as: DELTASONE  Take 10 mg by mouth daily.   propranolol  ER 60 MG 24 hr capsule Commonly known as: INDERAL  LA TAKE 1 CAPSULE DAILY-CALL & SCHEDULE APPOINTMENT FOR MARCH 2024 OR SOONER IF SYMPTOMS CHANGE   traZODone  50 MG tablet Commonly known as: DESYREL  Take 50 mg by mouth at bedtime.        Discharge Exam: Filed Weights   02/09/24 0953 02/11/24 0500  Weight: 65.3 kg 63.7 kg        General:  AAO x 3,  cooperative, no distress;   HEENT:  Normocephalic, PERRL, otherwise with in Normal limits   Neuro:  CNII-XII intact. , normal motor and sensation, reflexes intact   Lungs:   Clear to auscultation BL, Respirations unlabored,  No wheezes / crackles  Cardio:    S1/S2, RRR, No murmure, No Rubs or Gallops   Abdomen:  Soft, non-tender, bowel sounds active all four quadrants, no guarding or peritoneal signs.  Muscular  skeletal:  Limited exam -global generalized weaknesses - in bed, able to move all 4 extremities,   2+ pulses,  symmetric, No pitting edema  Skin:  Dry, warm to touch, negative for any Rashes,  Wounds: Please see nursing documentation       Condition at discharge: fair  The results of significant diagnostics from this hospitalization (including imaging, microbiology, ancillary and laboratory) are listed below for reference.   Imaging Studies: CT ABDOMEN PELVIS WO CONTRAST Result Date: 02/09/2024 CLINICAL DATA:  Acute abdominal pain EXAM: CT ABDOMEN AND PELVIS WITHOUT CONTRAST TECHNIQUE: Multidetector CT imaging of the abdomen and pelvis was performed following the standard protocol without IV contrast. RADIATION DOSE  REDUCTION: This exam was performed according to the departmental dose-optimization program which includes automated exposure control, adjustment of the mA and/or kV according to patient size and/or use of iterative reconstruction technique. COMPARISON:  Jan 09, 2024 FINDINGS: Lower chest: No infiltrates or consolidations, no pleural effusions Hepatobiliary: No focal liver abnormality is seen. Status post cholecystectomy. No biliary dilatation. Pancreas: Pancreas normal size. No masses calcifications or inflammatory changes. Spleen: Spleen normal size.  No masses. Adrenals/Urinary Tract: Adrenal glands are normal size. Follow-up recommended. Kidneys are normal. No masses calcifications or hydronephrosis Stomach/Bowel: No small or large bowel obstruction or inflammatory changes. Diffuse diverticulosis of the sigmoid colon no definitive evidence of diverticulitis. Moderate amount of residual fecal material throughout the colon without obstruction or constipation. Vascular/Lymphatic: Aortic atherosclerosis. No enlarged abdominal or pelvic lymph nodes. No aneurysms, maximum AP diameter abdominal aorta is 2.6 cm. Reproductive: .  No masses. Bladder unremarkable. Other: Anterior abdominal wall unremarkable without evidence of umbilical or inguinal  hernias Musculoskeletal: Visualized portion of the thoracolumbar spine and pelvic structures grossly unremarkable without evidence of fracture bony abnormalities or soft tissue masses. IMPRESSION: *No acute findings in the abdomen or pelvis. *Diffuse diverticulosis of the sigmoid colon without definitive evidence of diverticulitis. *Moderate amount of residual fecal material throughout the colon without obstruction or constipation. *Status post cholecystectomy. *Aortic atherosclerosis. Electronically Signed   By: Fredrich Jefferson M.D.   On: 02/09/2024 13:03   DG Chest 2 View Result Date: 02/09/2024 CLINICAL DATA:  Chest pain. EXAM: CHEST - 2 VIEW COMPARISON:  01/09/2024. FINDINGS:  There are probable atelectatic changes/scarring at the lung bases. Bilateral lung fields are otherwise clear. No acute consolidation or lung collapse. Bilateral costophrenic angles are clear. Normal cardio-mediastinal silhouette. No acute osseous abnormalities. The soft tissues are within normal limits. There are surgical clips in the right upper quadrant, typical of a previous cholecystectomy. IMPRESSION: No active cardiopulmonary disease. Electronically Signed   By: Beula Brunswick M.D.   On: 02/09/2024 10:45    Microbiology: Results for orders placed or performed during the hospital encounter of 08/26/22  Resp panel by RT-PCR (RSV, Flu A&B, Covid) Anterior Nasal Swab     Status: None   Collection Time: 08/26/22 11:10 AM   Specimen: Anterior Nasal Swab  Result Value Ref Range Status   SARS Coronavirus 2 by RT PCR NEGATIVE NEGATIVE Final    Comment: (NOTE) SARS-CoV-2 target nucleic acids are NOT DETECTED.  The SARS-CoV-2 RNA is generally detectable in upper respiratory specimens during the acute phase of infection. The lowest concentration of SARS-CoV-2 viral copies this assay can detect is 138 copies/mL. A negative result does not preclude SARS-Cov-2 infection and should not be used as the sole basis for treatment or other patient management decisions. A negative result may occur with  improper specimen collection/handling, submission of specimen other than nasopharyngeal swab, presence of viral mutation(s) within the areas targeted by this assay, and inadequate number of viral copies(<138 copies/mL). A negative result must be combined with clinical observations, patient history, and epidemiological information. The expected result is Negative.  Fact Sheet for Patients:  BloggerCourse.com  Fact Sheet for Healthcare Providers:  SeriousBroker.it  This test is no t yet approved or cleared by the [place]  FDA and  has been authorized  for detection and/or diagnosis of SARS-CoV-2 by FDA under an Emergency Use Authorization (EUA). This EUA will remain  in effect (meaning this test can be used) for the duration of the COVID-19 declaration under Section 564(b)(1) of the Act, 21 U.S.C.section 360bbb-3(b)(1), unless the authorization is terminated  or revoked sooner.       Influenza A by PCR NEGATIVE NEGATIVE Final   Influenza B by PCR NEGATIVE NEGATIVE Final    Comment: (NOTE) The Xpert Xpress SARS-CoV-2/FLU/RSV plus assay is intended as an aid in the diagnosis of influenza from Nasopharyngeal swab specimens and should not be used as a sole basis for treatment. Nasal washings and aspirates are unacceptable for Xpert Xpress SARS-CoV-2/FLU/RSV testing.  Fact Sheet for Patients: BloggerCourse.com  Fact Sheet for Healthcare Providers: SeriousBroker.it  This test is not yet approved or cleared by the [place]  FDA and has been authorized for detection and/or diagnosis of SARS-CoV-2 by FDA under an Emergency Use Authorization (EUA). This EUA will remain in effect (meaning this test can be used) for the duration of the COVID-19 declaration under Section 564(b)(1) of the Act, 21 U.S.C. section 360bbb-3(b)(1), unless the authorization is terminated or revoked.     Resp  Syncytial Virus by PCR NEGATIVE NEGATIVE Final    Comment: (NOTE) Fact Sheet for Patients: BloggerCourse.com  Fact Sheet for Healthcare Providers: SeriousBroker.it  This test is not yet approved or cleared by the [place]  FDA and has been authorized for detection and/or diagnosis of SARS-CoV-2 by FDA under an Emergency Use Authorization (EUA). This EUA will remain in effect (meaning this test can be used) for the duration of the COVID-19 declaration under Section 564(b)(1) of the Act, 21 U.S.C. section 360bbb-3(b)(1), unless the authorization is  terminated or revoked.  Performed at [facility], [address] [place], [place] [address]   MRSA Next Gen by PCR, Nasal     Status: None   Collection Time: 08/26/22  7:07 PM   Specimen: Nasal Mucosa; Nasal Swab  Result Value Ref Range Status   MRSA by PCR Next Gen NOT DETECTED NOT DETECTED Final    Comment: (NOTE) The GeneXpert MRSA Assay (FDA approved for NASAL specimens only), is one component of a comprehensive MRSA colonization surveillance program. It is not intended to diagnose MRSA infection nor to guide or monitor treatment for MRSA infections. Test performance is not FDA approved in patients less than 39 years old. Performed at [facility], [address] [place], [place] [address]   Culture, blood (Routine X 2) w Reflex to ID Panel     Status: None   Collection Time: 08/27/22  3:35 PM   Specimen: BLOOD LEFT HAND  Result Value Ref Range Status   Specimen Description BLOOD LEFT HAND  Final   Special Requests   Final    BOTTLES DRAWN AEROBIC AND ANAEROBIC Blood Culture adequate volume   Culture   Final    NO GROWTH 5 DAYS Performed at [facility], [address], [place], [place] [address]    Report Status 09/01/2022 FINAL  Final  Culture, blood (Routine X 2) w Reflex to ID Panel     Status: None   Collection Time: 08/27/22  3:35 PM   Specimen: BLOOD LEFT HAND  Result Value Ref Range Status   Specimen Description BLOOD LEFT HAND  Final   Special Requests   Final    BOTTLES DRAWN AEROBIC ONLY Blood Culture adequate volume   Culture   Final    NO GROWTH 5 DAYS Performed at [facility], [address], [place], [place] [address]    Report Status 09/01/2022 FINAL  Final  CSF culture w Gram Stain     Status: None   Collection Time: 08/28/22  4:36 PM   Specimen: CSF; Cerebrospinal Fluid  Result Value Ref Range Status   Specimen Description CSF  Final   Special Requests  LUMBAR  Final   Gram Stain CYTOSPIN SMEAR NO ORGANISMS SEEN NO  WBC SEEN   Final   Culture   Final    NO GROWTH 3 DAYS Performed at [facility], [address], [place], [place] [address]    Report Status 08/31/2022 FINAL  Final  Respiratory (~20 pathogens) panel by PCR     Status: None   Collection Time: 08/28/22  8:45 PM   Specimen: Nasopharyngeal Swab; Respiratory  Result Value Ref Range Status   Adenovirus NOT DETECTED NOT DETECTED Final   Coronavirus 229E NOT DETECTED NOT DETECTED Final    Comment: (NOTE) The Coronavirus on the Respiratory Panel, DOES NOT test for the novel  Coronavirus (2019 nCoV)    Coronavirus HKU1 NOT DETECTED NOT DETECTED  Final   Coronavirus NL63 NOT DETECTED NOT DETECTED Final   Coronavirus OC43 NOT DETECTED NOT DETECTED Final   Metapneumovirus NOT DETECTED NOT DETECTED Final   Rhinovirus / Enterovirus NOT DETECTED NOT DETECTED Final   Influenza A NOT DETECTED NOT DETECTED Final   Influenza B NOT DETECTED NOT DETECTED Final   Parainfluenza Virus 1 NOT DETECTED NOT DETECTED Final   Parainfluenza Virus 2 NOT DETECTED NOT DETECTED Final   Parainfluenza Virus 3 NOT DETECTED NOT DETECTED Final   Parainfluenza Virus 4 NOT DETECTED NOT DETECTED Final   Respiratory Syncytial Virus NOT DETECTED NOT DETECTED Final   Bordetella pertussis NOT DETECTED NOT DETECTED Final   Bordetella Parapertussis NOT DETECTED NOT DETECTED Final   Chlamydophila pneumoniae NOT DETECTED NOT DETECTED Final   Mycoplasma pneumoniae NOT DETECTED NOT DETECTED Final    Comment: Performed at [facility], [address], [place], [place] [address]    Labs: CBC: Recent Labs  Lab 02/09/24 1008 02/10/24 0410 02/11/24 0225  WBC 11.7* 8.1 10.8*  HGB 9.8* 8.0* 9.5*  HCT 31.0* 26.8* 32.0*  MCV 80.7 81.5 82.9  PLT 361 264 309   Basic Metabolic Panel: Recent Labs  Lab 02/09/24 1008 02/09/24 1451 02/10/24 0410 02/11/24 0225  NA 137  --  138 140  K 4.0  --  4.2 4.1  CL 106  --  109 108  CO2 21*  --  26 25  GLUCOSE 115*  --  90  76  BUN 47*  --  35* 26*  CREATININE 3.08*  --  2.32* 1.90*  CALCIUM 7.9*  --  7.7* 8.2*  MG  --  2.3  --   --   PHOS  --  3.2  --   --    Liver Function Tests: Recent Labs  Lab 02/09/24 1008 02/10/24 0410 02/11/24 0225  AST 21 15 17   ALT 18 17 13   ALKPHOS 61 51 63  BILITOT 0.5 0.4 0.6  PROT 6.2* 5.2* 5.9*  ALBUMIN  2.8* 2.5* 2.9*   CBG: Recent Labs  Lab 02/09/24 2053 02/10/24 0716 02/10/24 1127 02/10/24 1650 02/11/24 0710  GLUCAP 134* 85 83 108* 100*    Discharge time spent: greater than 30 minutes.  Signed: Bobbetta Burnet, MD [facility] 02/11/2024

## 2024-02-11 NOTE — Evaluation (Signed)
 Physical Therapy Evaluation Patient Details Name: [name] MRN: [id number] DOB: 02-01-1946 Today's Date: 02/11/2024  History of Present Illness  [name] is a 78 year old male with extensive history of mild/early dementia, anxiety, depression, CKD 3B, GERD, gout, hyperlipidemia, hypertension, RA, degenerative disc disease, PUD,  chronic pain-especially abdominal ...   Presented to ED with intractable nausea vomiting and abdominal pain.     Reporting 2 weeks of lower chest, abdominal pain, epigastric pain.  Associated with mild shortness of breath, and progressive nausea vomiting.  He reports his overall constant pain with certain position gets worse.  Pain is mainly periumbilical/upper abdominal area.  With no radiation.  Shortness of breath is constant limited to ability mobility and exertion.     Denies of having fever, chills, cough, congestion, denies having lower extremity edema, denies of having any diarrhea.  Complains of nausea but no extensive vomiting.   Clinical Impression  Patient tolerated PT evaluation well. Patient with history of early/mild dementia, would need to confirm history with family. Patient reporting since last admission, he uses RW for mobility most, and requires some assist for ADLs from wife and family. On this date, patient is mod independent with bed mobility, and requires CGA/supervision for transfers and ambulation due to very mild unsteadiness and increased pain causing slow labored movement. Patient reports he is at/near his baseline for functioning. With confirmation of good support at home, home health PT services would be recommended once home as pt demonstrates general weakness, dec endurance, slightly impaired balance and reports history of fall. Patient does not present with urgent need for skilled physical therapy acutely at this time. Patient discharged to care of nursing for ambulation daily as tolerated for length of stay.       If plan is  discharge home, recommend the following: A little help with walking and/or transfers;A little help with bathing/dressing/bathroom;Help with stairs or ramp for entrance   Can travel by private vehicle        Equipment Recommendations None recommended by PT  Recommendations for Other Services       Functional Status Assessment Patient has had a recent decline in their functional status and demonstrates the ability to make significant improvements in function in a reasonable and predictable amount of time.     Precautions / Restrictions Precautions Precautions: Fall Recall of Precautions/Restrictions: Impaired Restrictions Weight Bearing Restrictions Per Provider Order: No      Mobility  Bed Mobility Overal bed mobility: Modified Independent, Needs Assistance Bed Mobility: Supine to Sit     Supine to sit: HOB elevated     General bed mobility comments: HOB slightly elevated. PT reporting he sleeps propped up due to neck pain    Transfers Overall transfer level: Needs assistance Equipment used: Rolling walker (2 wheels) Transfers: Sit to/from Stand, Bed to chair/wheelchair/BSC Sit to Stand: Modified independent (Device/Increase time), Supervision   Step pivot transfers: Contact guard assist       General transfer comment: Inc time needed due to labored movement during transfers. STS from bed and recliner w/ supervision/modified independent. CGA during transfer due to mild unsteadiness.    Ambulation/Gait Ambulation/Gait assistance: Contact guard assist Gait Distance (Feet): 10 Feet Assistive device: Rolling walker (2 wheels) Gait Pattern/deviations: Trunk flexed, Step-through pattern, Decreased step length - right, Decreased step length - left, Knee flexed in stance - left, Knee flexed in stance - right, Shuffle Gait velocity: Dec     General Gait Details: Slow, labored movement. Limited due to  pt reporting headache during mobility. CGA throughout due to mild  unsteadiness.  Stairs            Wheelchair Mobility     Tilt Bed    Modified Rankin (Stroke Patients Only)       Balance Overall balance assessment: Needs assistance, Modified Independent Sitting-balance support: No upper extremity supported, Feet supported Sitting balance-Leahy Scale: Good Sitting balance - Comments: Seated EOB w/ varying UE/LE support while fixing socks and during MMT   Standing balance support: During functional activity, Reliant on assistive device for balance, Bilateral upper extremity supported Standing balance-Leahy Scale: Fair Standing balance comment: w/ RW and CGA           Pertinent Vitals/Pain Pain Assessment Pain Assessment: Faces Faces Pain Scale: Hurts little more Pain Location: Reports very mild chest pain at start of session but then headache limits mobility during session Pain Descriptors / Indicators: Headache, Discomfort Pain Intervention(s): RN gave pain meds during session, Limited activity within patient's tolerance, Repositioned    Home Living Family/patient expects to be discharged to:: Private residence Living Arrangements: Spouse/significant other Available Help at Discharge: Family;Available PRN/intermittently Type of Home: House Home Access: Stairs to enter Entrance Stairs-Rails: Right Entrance Stairs-Number of Steps: 3-4   Home Layout: One level Home Equipment: Agricultural consultant (2 wheels);Cane - single point;Shower seat Additional Comments: Patient reports no change in living situation since last vist.    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 pt demo 4- at best with shoulder flexion, hip flexion, and ankle DF MMT.)    Cervical / Trunk Assessment Cervical / Trunk Assessment: Kyphotic  Communication   Communication Communication: No apparent difficulties    Cognition Arousal: Alert Behavior During Therapy: WFL for tasks assessed/performed   PT - Cognitive impairments: History of cognitive impairments, No family/caregiver present to determine baseline       PT - Cognition Comments: Pt with history of early/mild dementia Following commands: Intact       Cueing Cueing Techniques: Verbal cues     General Comments      Exercises     Assessment/Plan    PT Assessment All further PT needs can be met in the next venue of care  PT Problem List Decreased strength;Decreased range of motion;Decreased activity tolerance;Decreased balance;Decreased mobility;Decreased cognition;Pain       PT Treatment Interventions      PT Goals (Current goals can be found in the Care Plan section)  Acute Rehab PT Goals Patient Stated Goal: Return home with HHPT services PT Goal Formulation: With patient Time For Goal Achievement: 02/12/24 Potential to Achieve Goals: Good    Frequency       Co-evaluation               AM-PAC PT "6 Clicks" Mobility  Outcome Measure Help needed turning from your back to your side while in a flat bed without using bedrails?: None Help needed moving from lying on your back to sitting on the side of a flat bed without using bedrails?: A Little Help needed moving to and from  a bed to a chair (including a wheelchair)?: A Little Help needed standing up from a chair using your arms (e.g., wheelchair or bedside chair)?: None Help needed to walk in hospital room?: A Little Help needed climbing 3-5 steps with a railing? : A Lot 6 Click Score: 19    End of Session   Activity Tolerance: Patient tolerated treatment  well;Patient limited by pain Patient left: in chair;with call bell/phone within reach;with chair alarm set;with nursing/sitter in room Nurse Communication: Mobility status PT Visit Diagnosis: Unsteadiness on feet (R26.81);Other abnormalities of gait and mobility (R26.89);Pain;History of falling (Z91.81) Pain - part of body:  (Chest and abd pain)    Time: 0912-0925 PT Time Calculation (min) (ACUTE ONLY): 13 min   Charges:   PT Evaluation $PT Eval Low Complexity: 1 Low PT Treatments $Therapeutic Activity: 8-22 mins PT General Charges $$ ACUTE PT VISIT: 1 Visit       10:29 AM, 02/11/24 Marysue Sola, PT, DPT [place] with [facility]

## 2024-02-12 ENCOUNTER — Encounter (HOSPITAL_COMMUNITY): Payer: Self-pay | Admitting: Internal Medicine

## 2024-02-12 ENCOUNTER — Ambulatory Visit: Admitting: Gastroenterology

## 2024-02-13 DIAGNOSIS — M47816 Spondylosis without myelopathy or radiculopathy, lumbar region: Secondary | ICD-10-CM | POA: Diagnosis not present

## 2024-02-14 LAB — SURGICAL PATHOLOGY

## 2024-03-01 ENCOUNTER — Ambulatory Visit: Payer: Self-pay | Admitting: Internal Medicine

## 2024-03-04 DIAGNOSIS — G8929 Other chronic pain: Secondary | ICD-10-CM | POA: Diagnosis not present

## 2024-03-04 DIAGNOSIS — R600 Localized edema: Secondary | ICD-10-CM | POA: Diagnosis not present

## 2024-03-04 DIAGNOSIS — R634 Abnormal weight loss: Secondary | ICD-10-CM | POA: Diagnosis not present

## 2024-03-04 DIAGNOSIS — R0602 Shortness of breath: Secondary | ICD-10-CM | POA: Diagnosis not present

## 2024-03-04 DIAGNOSIS — R5383 Other fatigue: Secondary | ICD-10-CM | POA: Diagnosis not present

## 2024-03-06 ENCOUNTER — Other Ambulatory Visit: Payer: Self-pay | Admitting: Adult Health

## 2024-03-06 ENCOUNTER — Other Ambulatory Visit: Payer: Self-pay | Admitting: Neurology

## 2024-03-06 DIAGNOSIS — R4189 Other symptoms and signs involving cognitive functions and awareness: Secondary | ICD-10-CM

## 2024-04-08 NOTE — Transitions of Care (Post Inpatient/ED Visit) (Signed)
 04/08/2024  Patient ID: [name], male   DOB: 16-Feb-1946, 78 y.o.   MRN: [id number]   Chart Review for transitions of care.  Telephone call to [employer]. Patient deceased on 04-28-2024.  Elverna Caffee J. Orrie Schubert RN, MSN [facility], [facility] RN Care Manager Direct Dial: [phone]  Fax: [phone] Website: [email or web address]
